# Patient Record
Sex: Female | Born: 1975 | Hispanic: No | Marital: Single | Smoking: Never smoker
Health system: Southern US, Community
[De-identification: ages and names within clinical notes are randomized; demographics above are authoritative.]

## PROBLEM LIST (undated history)

## (undated) ENCOUNTER — Emergency Department (HOSPITAL_COMMUNITY): Discharge status: Absent without leave | Payer: No Typology Code available for payment source

## (undated) DIAGNOSIS — Z5189 Encounter for other specified aftercare: Secondary | ICD-10-CM

## (undated) DIAGNOSIS — I5021 Acute systolic (congestive) heart failure: Secondary | ICD-10-CM

## (undated) DIAGNOSIS — M0820 Juvenile rheumatoid arthritis with systemic onset, unspecified site: Secondary | ICD-10-CM

---

## 2003-03-26 ENCOUNTER — Inpatient Hospital Stay (HOSPITAL_COMMUNITY): Admission: RE | Admit: 2003-03-26 | Discharge: 2003-03-28 | Payer: Self-pay | Admitting: Family Medicine

## 2003-03-31 ENCOUNTER — Encounter: Admission: RE | Admit: 2003-03-31 | Discharge: 2003-03-31 | Payer: Self-pay | Admitting: Family Medicine

## 2004-03-09 ENCOUNTER — Inpatient Hospital Stay (HOSPITAL_COMMUNITY): Admission: AD | Admit: 2004-03-09 | Discharge: 2004-03-10 | Payer: Self-pay | Admitting: Obstetrics and Gynecology

## 2004-04-16 ENCOUNTER — Inpatient Hospital Stay (HOSPITAL_COMMUNITY): Admission: AD | Admit: 2004-04-16 | Discharge: 2004-04-16 | Payer: Self-pay | Admitting: *Deleted

## 2004-09-10 ENCOUNTER — Inpatient Hospital Stay (HOSPITAL_COMMUNITY): Admission: AD | Admit: 2004-09-10 | Discharge: 2004-09-10 | Payer: Self-pay | Admitting: Obstetrics & Gynecology

## 2004-09-10 ENCOUNTER — Ambulatory Visit: Payer: Self-pay | Admitting: Obstetrics and Gynecology

## 2004-10-17 ENCOUNTER — Inpatient Hospital Stay (HOSPITAL_COMMUNITY): Admission: AD | Admit: 2004-10-17 | Discharge: 2004-10-19 | Payer: Self-pay | Admitting: Obstetrics and Gynecology

## 2004-10-17 ENCOUNTER — Ambulatory Visit: Payer: Self-pay | Admitting: Certified Nurse Midwife

## 2006-05-21 ENCOUNTER — Emergency Department (HOSPITAL_COMMUNITY): Admission: EM | Admit: 2006-05-21 | Discharge: 2006-05-22 | Payer: Self-pay | Admitting: Emergency Medicine

## 2006-08-12 ENCOUNTER — Emergency Department (HOSPITAL_COMMUNITY): Admission: EM | Admit: 2006-08-12 | Discharge: 2006-08-12 | Payer: Self-pay | Admitting: Emergency Medicine

## 2007-01-04 ENCOUNTER — Emergency Department (HOSPITAL_COMMUNITY): Admission: EM | Admit: 2007-01-04 | Discharge: 2007-01-04 | Payer: Self-pay | Admitting: Emergency Medicine

## 2008-09-08 ENCOUNTER — Inpatient Hospital Stay (HOSPITAL_COMMUNITY): Admission: EM | Admit: 2008-09-08 | Discharge: 2008-09-15 | Payer: Self-pay | Admitting: Emergency Medicine

## 2008-09-10 ENCOUNTER — Encounter (INDEPENDENT_AMBULATORY_CARE_PROVIDER_SITE_OTHER): Payer: Self-pay | Admitting: *Deleted

## 2008-09-10 ENCOUNTER — Ambulatory Visit: Payer: Self-pay | Admitting: Vascular Surgery

## 2010-07-11 LAB — CBC
HCT: 33.3 % — ABNORMAL LOW (ref 36.0–46.0)
HCT: 34 % — ABNORMAL LOW (ref 36.0–46.0)
HCT: 34 % — ABNORMAL LOW (ref 36.0–46.0)
HCT: 35.3 % — ABNORMAL LOW (ref 36.0–46.0)
HCT: 35.5 % — ABNORMAL LOW (ref 36.0–46.0)
HCT: 35.9 % — ABNORMAL LOW (ref 36.0–46.0)
HCT: 38.8 % (ref 36.0–46.0)
HCT: 39.9 % (ref 36.0–46.0)
Hemoglobin: 11.6 g/dL — ABNORMAL LOW (ref 12.0–15.0)
Hemoglobin: 11.9 g/dL — ABNORMAL LOW (ref 12.0–15.0)
Hemoglobin: 11.9 g/dL — ABNORMAL LOW (ref 12.0–15.0)
Hemoglobin: 12.3 g/dL (ref 12.0–15.0)
Hemoglobin: 12.3 g/dL (ref 12.0–15.0)
Hemoglobin: 12.6 g/dL (ref 12.0–15.0)
Hemoglobin: 13.6 g/dL (ref 12.0–15.0)
Hemoglobin: 14.2 g/dL (ref 12.0–15.0)
MCHC: 34.2 g/dL (ref 30.0–36.0)
MCHC: 34.7 g/dL (ref 30.0–36.0)
MCHC: 34.7 g/dL (ref 30.0–36.0)
MCHC: 34.9 g/dL (ref 30.0–36.0)
MCHC: 35 g/dL (ref 30.0–36.0)
MCHC: 35 g/dL (ref 30.0–36.0)
MCHC: 35.6 g/dL (ref 30.0–36.0)
MCHC: 35.7 g/dL (ref 30.0–36.0)
MCV: 91.8 fL (ref 78.0–100.0)
MCV: 91.9 fL (ref 78.0–100.0)
MCV: 92 fL (ref 78.0–100.0)
MCV: 92.1 fL (ref 78.0–100.0)
MCV: 92.4 fL (ref 78.0–100.0)
MCV: 92.5 fL (ref 78.0–100.0)
MCV: 92.6 fL (ref 78.0–100.0)
MCV: 92.8 fL (ref 78.0–100.0)
Platelets: 188 10*3/uL (ref 150–400)
Platelets: 200 10*3/uL (ref 150–400)
Platelets: 235 10*3/uL (ref 150–400)
Platelets: 235 10*3/uL (ref 150–400)
Platelets: 285 10*3/uL (ref 150–400)
Platelets: 295 10*3/uL (ref 150–400)
Platelets: 307 10*3/uL (ref 150–400)
Platelets: 319 10*3/uL (ref 150–400)
RBC: 3.63 MIL/uL — ABNORMAL LOW (ref 3.87–5.11)
RBC: 3.66 MIL/uL — ABNORMAL LOW (ref 3.87–5.11)
RBC: 3.68 MIL/uL — ABNORMAL LOW (ref 3.87–5.11)
RBC: 3.84 MIL/uL — ABNORMAL LOW (ref 3.87–5.11)
RBC: 3.87 MIL/uL (ref 3.87–5.11)
RBC: 3.89 MIL/uL (ref 3.87–5.11)
RBC: 4.2 MIL/uL (ref 3.87–5.11)
RBC: 4.34 MIL/uL (ref 3.87–5.11)
RDW: 12.4 % (ref 11.5–15.5)
RDW: 12.4 % (ref 11.5–15.5)
RDW: 12.4 % (ref 11.5–15.5)
RDW: 12.5 % (ref 11.5–15.5)
RDW: 12.5 % (ref 11.5–15.5)
RDW: 12.7 % (ref 11.5–15.5)
RDW: 12.8 % (ref 11.5–15.5)
RDW: 12.8 % (ref 11.5–15.5)
WBC: 20.4 10*3/uL — ABNORMAL HIGH (ref 4.0–10.5)
WBC: 21.7 10*3/uL — ABNORMAL HIGH (ref 4.0–10.5)
WBC: 21.7 10*3/uL — ABNORMAL HIGH (ref 4.0–10.5)
WBC: 25.3 10*3/uL — ABNORMAL HIGH (ref 4.0–10.5)
WBC: 26.4 10*3/uL — ABNORMAL HIGH (ref 4.0–10.5)
WBC: 26.9 10*3/uL — ABNORMAL HIGH (ref 4.0–10.5)
WBC: 27.9 10*3/uL — ABNORMAL HIGH (ref 4.0–10.5)
WBC: 29.4 10*3/uL — ABNORMAL HIGH (ref 4.0–10.5)

## 2010-07-11 LAB — COMPREHENSIVE METABOLIC PANEL
ALT: 12 U/L (ref 0–35)
ALT: 18 U/L (ref 0–35)
AST: 29 U/L (ref 0–37)
AST: 36 U/L (ref 0–37)
Albumin: 3.1 g/dL — ABNORMAL LOW (ref 3.5–5.2)
Albumin: 3.9 g/dL (ref 3.5–5.2)
Alkaline Phosphatase: 120 U/L — ABNORMAL HIGH (ref 39–117)
Alkaline Phosphatase: 97 U/L (ref 39–117)
BUN: 4 mg/dL — ABNORMAL LOW (ref 6–23)
BUN: 4 mg/dL — ABNORMAL LOW (ref 6–23)
CO2: 22 mEq/L (ref 19–32)
CO2: 22 mEq/L (ref 19–32)
Calcium: 8 mg/dL — ABNORMAL LOW (ref 8.4–10.5)
Calcium: 8.6 mg/dL (ref 8.4–10.5)
Chloride: 103 mEq/L (ref 96–112)
Chloride: 106 mEq/L (ref 96–112)
Creatinine, Ser: 0.45 mg/dL (ref 0.4–1.2)
Creatinine, Ser: 0.52 mg/dL (ref 0.4–1.2)
GFR calc Af Amer: >60 mL/min (ref >60)
GFR calc Af Amer: >60 mL/min (ref >60)
GFR calc non Af Amer: >60 mL/min (ref >60)
GFR calc non Af Amer: >60 mL/min (ref >60)
Glucose, Bld: 118 mg/dL — ABNORMAL HIGH (ref 70–99)
Glucose, Bld: 67 mg/dL — ABNORMAL LOW (ref 70–99)
Potassium: 2.9 mEq/L — ABNORMAL LOW (ref 3.5–5.1)
Potassium: 3.5 mEq/L (ref 3.5–5.1)
Sodium: 137 mEq/L (ref 135–145)
Sodium: 138 mEq/L (ref 135–145)
Total Bilirubin: 0.7 mg/dL (ref 0.3–1.2)
Total Bilirubin: 0.8 mg/dL (ref 0.3–1.2)
Total Protein: 6.4 g/dL (ref 6.0–8.3)
Total Protein: 7.9 g/dL (ref 6.0–8.3)

## 2010-07-11 LAB — BASIC METABOLIC PANEL
BUN: 4 mg/dL — ABNORMAL LOW (ref 6–23)
BUN: 8 mg/dL (ref 6–23)
CO2: 22 mEq/L (ref 19–32)
CO2: 27 mEq/L (ref 19–32)
Calcium: 8.3 mg/dL — ABNORMAL LOW (ref 8.4–10.5)
Calcium: 8.5 mg/dL (ref 8.4–10.5)
Chloride: 104 mEq/L (ref 96–112)
Chloride: 107 mEq/L (ref 96–112)
Creatinine, Ser: 0.41 mg/dL (ref 0.4–1.2)
Creatinine, Ser: 0.48 mg/dL (ref 0.4–1.2)
GFR calc Af Amer: >60 mL/min (ref >60)
GFR calc Af Amer: >60 mL/min (ref >60)
GFR calc non Af Amer: >60 mL/min (ref >60)
GFR calc non Af Amer: >60 mL/min (ref >60)
Glucose, Bld: 115 mg/dL — ABNORMAL HIGH (ref 70–99)
Glucose, Bld: 59 mg/dL — ABNORMAL LOW (ref 70–99)
Potassium: 3.7 mEq/L (ref 3.5–5.1)
Potassium: 4 mEq/L (ref 3.5–5.1)
Sodium: 137 mEq/L (ref 135–145)
Sodium: 139 mEq/L (ref 135–145)

## 2010-07-11 LAB — DIFFERENTIAL
Band Neutrophils: 0 % (ref 0–10)
Basophils Absolute: 0 10*3/uL (ref 0.0–0.1)
Basophils Absolute: 0 10*3/uL (ref 0.0–0.1)
Basophils Relative: 0 % (ref 0–1)
Basophils Relative: 0 % (ref 0–1)
Blasts: 0 %
Eosinophils Absolute: 0 10*3/uL (ref 0.0–0.7)
Eosinophils Absolute: 0 10*3/uL (ref 0.0–0.7)
Eosinophils Relative: 0 % (ref 0–5)
Eosinophils Relative: 0 % (ref 0–5)
Lymphocytes Relative: 2 % — ABNORMAL LOW (ref 12–46)
Lymphocytes Relative: 8 % — ABNORMAL LOW (ref 12–46)
Lymphs Abs: 0.6 10*3/uL — ABNORMAL LOW (ref 0.7–4.0)
Lymphs Abs: 2.1 10*3/uL (ref 0.7–4.0)
Metamyelocytes Relative: 0 %
Monocytes Absolute: 0 10*3/uL — ABNORMAL LOW (ref 0.1–1.0)
Monocytes Absolute: 0.6 10*3/uL (ref 0.1–1.0)
Monocytes Relative: 0 % — ABNORMAL LOW (ref 3–12)
Monocytes Relative: 2 % — ABNORMAL LOW (ref 3–12)
Myelocytes: 0 %
Neutro Abs: 24.3 10*3/uL — ABNORMAL HIGH (ref 1.7–7.7)
Neutro Abs: 26.7 10*3/uL — ABNORMAL HIGH (ref 1.7–7.7)
Neutrophils Relative %: 92 % — ABNORMAL HIGH (ref 43–77)
Neutrophils Relative %: 96 % — ABNORMAL HIGH (ref 43–77)
Promyelocytes Absolute: 0 %
WBC Morphology: INCREASED
WBC Morphology: INCREASED
nRBC: 0 /100 WBC

## 2010-07-11 LAB — CULTURE, BLOOD (ROUTINE X 2)
Culture: NO GROWTH
Culture: NO GROWTH

## 2010-07-11 LAB — URINALYSIS, ROUTINE W REFLEX MICROSCOPIC
Bilirubin Urine: NEGATIVE
Glucose, UA: NEGATIVE mg/dL
Ketones, ur: NEGATIVE mg/dL
Nitrite: NEGATIVE
Protein, ur: NEGATIVE mg/dL
Specific Gravity, Urine: 1.008 (ref 1.005–1.030)
Urobilinogen, UA: 1 mg/dL (ref 0.0–1.0)
pH: 6.5 (ref 5.0–8.0)

## 2010-07-11 LAB — URINALYSIS, MICROSCOPIC ONLY
Bilirubin Urine: NEGATIVE
Glucose, UA: NEGATIVE mg/dL
Hgb urine dipstick: NEGATIVE
Ketones, ur: NEGATIVE mg/dL
Leukocytes, UA: NEGATIVE
Nitrite: NEGATIVE
Protein, ur: NEGATIVE mg/dL
Specific Gravity, Urine: 1.01 (ref 1.005–1.030)
Urobilinogen, UA: 1 mg/dL (ref 0.0–1.0)
pH: 7 (ref 5.0–8.0)

## 2010-07-11 LAB — POCT I-STAT, CHEM 8
BUN: 5 mg/dL — ABNORMAL LOW (ref 6–23)
Calcium, Ion: 1.01 mmol/L — ABNORMAL LOW (ref 1.12–1.32)
Chloride: 104 mEq/L (ref 96–112)
Creatinine, Ser: 0.6 mg/dL (ref 0.4–1.2)
Glucose, Bld: 97 mg/dL (ref 70–99)
HCT: 43 % (ref 36.0–46.0)
Hemoglobin: 14.6 g/dL (ref 12.0–15.0)
Potassium: 3.5 mEq/L (ref 3.5–5.1)
Sodium: 136 mEq/L (ref 135–145)
TCO2: 23 mmol/L (ref 0–100)

## 2010-07-11 LAB — CYCLIC CITRUL PEPTIDE ANTIBODY, IGG: Cyclic Citrullin Peptide Ab: 1 U/mL (ref <7)

## 2010-07-11 LAB — CK TOTAL AND CKMB (NOT AT ARMC)
CK, MB: 0.6 ng/mL (ref 0.3–4.0)
CK, MB: 1.5 ng/mL (ref 0.3–4.0)
CK, MB: 1.8 ng/mL (ref 0.3–4.0)
Relative Index: INVALID (ref 0.0–2.5)
Relative Index: INVALID (ref 0.0–2.5)
Relative Index: INVALID (ref 0.0–2.5)
Total CK: 53 U/L (ref 7–177)
Total CK: 64 U/L (ref 7–177)
Total CK: 69 U/L (ref 7–177)

## 2010-07-11 LAB — URINE MICROSCOPIC-ADD ON

## 2010-07-11 LAB — PROTIME-INR
INR: 1.2 (ref 0.00–1.49)
Prothrombin Time: 15.4 seconds — ABNORMAL HIGH (ref 11.6–15.2)

## 2010-07-11 LAB — ANTI-DNA ANTIBODY, DOUBLE-STRANDED: ds DNA Ab: 2 IU/mL (ref <5)

## 2010-07-11 LAB — POCT PREGNANCY, URINE: Preg Test, Ur: NEGATIVE

## 2010-07-11 LAB — TECHNOLOGIST SMEAR REVIEW

## 2010-07-11 LAB — URINE CULTURE
Colony Count: 70000
Colony Count: NO GROWTH
Culture: NO GROWTH

## 2010-07-11 LAB — HEPATITIS PANEL, ACUTE
HCV Ab: NEGATIVE
Hep A IgM: NEGATIVE
Hep B C IgM: NEGATIVE
Hepatitis B Surface Ag: NEGATIVE

## 2010-07-11 LAB — RHEUMATOID FACTOR: Rhuematoid fact SerPl-aCnc: <20 IU/mL (ref 0–20)

## 2010-07-11 LAB — GC/CHLAMYDIA PROBE AMP, URINE
Chlamydia, Swab/Urine, PCR: NEGATIVE
GC Probe Amp, Urine: NEGATIVE

## 2010-07-11 LAB — ANTI-SMOOTH MUSCLE ANTIBODY, IGG: F-Actin IgG: <20 U (ref <20)

## 2010-07-11 LAB — SJOGRENS SYNDROME-A EXTRACTABLE NUCLEAR ANTIBODY: SSA (Ro) (ENA) Antibody, IgG: <0.2 AI (ref <1.0)

## 2010-07-11 LAB — MAGNESIUM: Magnesium: 1.7 mg/dL (ref 1.5–2.5)

## 2010-07-11 LAB — LIPID PANEL
Cholesterol: 136 mg/dL (ref 0–200)
HDL: 32 mg/dL — ABNORMAL LOW (ref >39)
LDL Cholesterol: 83 mg/dL (ref 0–99)
Total CHOL/HDL Ratio: 4.3 RATIO
Triglycerides: 107 mg/dL (ref <150)
VLDL: 21 mg/dL (ref 0–40)

## 2010-07-11 LAB — HIV ANTIBODY (ROUTINE TESTING W REFLEX): HIV: NONREACTIVE

## 2010-07-11 LAB — FERRITIN: Ferritin: 2684 ng/mL — ABNORMAL HIGH (ref 10–291)

## 2010-07-11 LAB — SEDIMENTATION RATE: Sed Rate: 16 mm/hr (ref 0–22)

## 2010-07-11 LAB — C-REACTIVE PROTEIN: CRP: 14 mg/dL — ABNORMAL HIGH (ref <0.6)

## 2010-07-11 LAB — APTT: aPTT: 37 seconds (ref 24–37)

## 2010-07-11 LAB — ANTISTREPTOLYSIN O TITER: ASO: <25 IU/mL (ref 0–116)

## 2010-07-11 LAB — SJOGRENS SYNDROME-B EXTRACTABLE NUCLEAR ANTIBODY: SSB (La) (ENA) Antibody, IgG: <0.2 AI (ref <1.0)

## 2010-07-11 LAB — TSH: TSH: 1.25 u[IU]/mL (ref 0.350–4.500)

## 2010-07-11 LAB — ANTI-RIBONUCLEIC ACID ANTIBODY: Ribonucleic Protein(ENA) Antibody, IgG: <0.2 AI (ref <1.0)

## 2010-07-11 LAB — PHOSPHORUS: Phosphorus: 2.3 mg/dL (ref 2.3–4.6)

## 2010-07-11 LAB — ANA: Anti Nuclear Antibody(ANA): NEGATIVE

## 2010-08-16 NOTE — Consult Note (Signed)
NAME:  Kathy Rosales, Kathy Rosales        ACCOUNT NO.:  [id number]   MEDICAL RECORD NO.:  [id number]          PATIENT TYPE:  INP   LOCATION:  5128                         FACILITY:  [facility]   PHYSICIAN:  Acey Lav, MD  DATE OF BIRTH:  March 25, 1976   DATE OF CONSULTATION:  09/11/2008  DATE OF DISCHARGE:                                 CONSULTATION   REASON FOR INFECTIOUS DISEASE CONSULTATION:  A patient with fever, rash,  arthralgias and myalgias.   HISTORY OF PRESENT ILLNESS:  [name] is a 35 year old [place]  lady who has been previously healthy, who presents acutely with onset of  swelling in her wrists that developed last Friday.  She then developed a  fever up to 103 degrees, swelling and pain which progressed into both of  her wrists, elbows, shoulders, hips and knees.  The knee pain was so  severe that she was having difficulty ambulating.  She came to the  emergency department, where she was found  to be febrile at 101.7  degrees.  Her white blood cell count in the ER was 26,000, and  reportedly her smear showed 20% bands.  Her chest x-ray had shown a  nodular density in the right lower lobe.  She was admitted to the  hospitalists' service with concerns of a rheumatological disease, but  also likely due to the bandemia and leukocytosis concern of an  infectious disease process.  She was started on prednisone 40 mg daily,  along with Rocephin and azithromycin.  Blood cultures and urine cultures  were obtained on admission and these fail to grow any pathogenic  organisms.  She has had imaging done, which has included films of her  hands which are negative.  Wrist films that show joint effusions  bilaterally.  She had a CT abdomen and pelvis which shows bilateral  pleural effusions with atelectasis and left external iliac adenopathy  and a cystic left adnexal process.  The patient initially also had a  pruritic and evanescent rash on her arms and legs, which was  blanching.  While on the steroids the rash disappeared and her join pain improved.  She then had steroids tapered.  Her rash returned and her joint pain  intensified, and she now today, when I examined her has developed a  malar rash.  In the interim, her white count also  has increased.  We  are consulted to work her up for am infectious disease cause of her  rash, arthralgias, fevers and leukocytosis.   PAST MEDICAL HISTORY:  No past medical history.   FAMILY HISTORY:  No family members with any significant medical disease  and no rheumatological disease.   SOCIAL HISTORY:  Smokes five cigarettes a day, occasionally alcohol.  No  illicit drugs. Lives with her husband.  She moved to the [place]  from [place] 30 years ago, and came [place] the border in [place].  She  cannot recall having any history of desert rheumatism at the time.   REVIEW OF SYSTEMS:  As described in the history of present illness,  otherwise a 10-point review of systems is negative.   PHYSICAL EXAMINATION:  VITAL SIGNS:  Temperature maximum since the last  24 hours is 100.3 degrees, current temperature 98.2, blood pressure  143/84, pulse 78, respirations 16, pulse ox 98% on room air.  GENERAL:  A quite pleasant lady, in no acute distress.  HEENT:  Normocephalic, atraumatic.  Pupils equal and reactive slightly  to light.  Sclerae anicteric.  She has a distinct malar rash present.  Oropharynx clear without exudate or lesions.  NECK:  Supple.  CARDIOVASCULAR:  Regular rate and rhythm.  No murmurs, gallops or rubs.  LUNGS:  Clear to auscultation bilaterally without wheeze or rales.  ABDOMEN:  Soft, nontender.  NEUROLOGIC:  Nonfocal.  MUSCULOSKELETAL:  The patient has pain on palpation of her shoulders  bilaterally and elbows.  She has pain on palpation of her wrists, but  not of any of her joints in her fingers, and no evidence synovitis in  these sites.  She does not have tenderness with external or  internal  rotation of her hip joints.  She has tenderness about her knees with  palpation, with an effusion bilaterally.  She has tenderness with varus  and valgus stress.  She also has tenderness in her ankles with flexion  and dorsiflexion and palpation of the ankle joint.   LABORATORY DATA:  CBC differential today with a white blood cell count  of 21.7, hemoglobin 11.9, platelets are 235. Hepatitis panel negative  for hepatitis-B surface antigen, core antibody ANC.  Metabolic panel:  Sodium of 137, potassium 3.7, chloride 107, bicarb 22, BUN 4, creatinine  0.41, glucose 114, calcium 8.3.  Rheumatoid factor negative.  ANA negative.  CK 53, MB 1.5.  TSH 1.2.  HIV negative.  Sed rate 18.  C-reactive protein 14.  Liver functions at  admission normal.  Bilirubin normal, normal  transaminases.   Urinalysis on admission with 3 to 6 white cells on microscopic, also  trace blood, moderate leukocyte esterase.   MICROBIOLOGICAL DATA:  Urine cultures on the 06/10th negative.  On  06/08th had 70,000 units of inconsequential growth.  The blood cultures  from 06/07th showed no growth.   RADIOGRAPHY:  CT of the pelvis showing tiny bilateral pleural effusions.  No acute process in the abdomen, but left external iliac adenopathy, a  33.1 cm left adnexal process, likely to be physiologic ovarian cyst.  The wrist views as mentioned, showing joint effusion bilaterally.  The  hand views showed no acute process.   IMPRESSION/RECOMMENDATIONS:  A 35 year old [place] lady with acute onset  of joint pain, fevers, pruritic blanching erythematous rash on her arms  and legs, and also with a malar rash with joint effusions in her wrists,  knees, elbows and multiple joints involved, with multiple joint pain,  who improved on steroids and now has gotten worse with withdrawal of the  steroids, who has leukocytosis and reported bandemia on initial complete  blood count review.  1. Fevers, joint pains, rash and  leukocytosis:  This presentation is      highly consistent with a rheumatological process.  Her malar rash      is classic for systemic lupus erythematosus.  Her antinuclear      antibody is negative, but certainly there are forms of lupus that      are antinuclear antibody-negative, and she is having a      rheumatological workup, which is under way.  I do not see any      evidence of an infectious disease process here, based on her  presentation.  One could get a gonorrhea and Chlamydia, but more      for thoroughness for health care screening.  I have never heard or      read of gonorrhea presenting in this kind of a manner.  She has      been screened for human immunodeficiency virus, which is negative.      She has been screened for hepatitis viruses.  There are a host of      other viruses that could be screened, but none of them would      present in this manner, with a classic malar rash, multiple joint      involvement and effusions, all highly suggestive of rheumatological      process.  Finally, she has been steroid-responsive.  I would      recommend taking her off of her Rocephin and azithromycin.  I would      increase her prednisone, to see if this helps her symptoms.  I      would do the rheumatological workup that you are currently      undertaking, and I would certainly consult rheumatology.  2. Fever, likely part of the rheumatological process as well:  I do      not think she has any evidence of any bacterial process causing      this fever at all.  3. Bandemia:  I suspect this was a lab error.  We have certainly      experienced this before, where a peripheral smear has been misread.      Want to repeat the peripheral smear, but I would not react to this      by giving broad-spectrum antibiotics, given her clinical picture,      which is not consistent with a severe infection at this point in      time.  4. Ovarian cyst:  One could get a trans-vaginal ultrasound.   I will      defer to the primary team regarding this.  I would observe this      patient off of antibiotics over the weekend and get a rheumatology      consultation.  I do not      think there is an infectious process going on.  My colleague, Dr.      Cliffton Asters, be available with questions over the weekend.      Please page him over the weekend if anything comes up to suggest      that the patient have an infectious disease process.      Acey Lav, MD  Electronically Signed     CV/MEDQ  D:  09/11/2008  T:  09/11/2008  Job:  [id number]

## 2010-08-16 NOTE — Discharge Summary (Signed)
NAME:  Kathy Rosales, RIGGENBACH        ACCOUNT NO.:  [id number]   MEDICAL RECORD NO.:  [id number]          PATIENT TYPE:  INP   LOCATION:  5128                         FACILITY:  [facility]   PHYSICIAN:  Monte Fantasia, MD  DATE OF BIRTH:  1975-11-08   DATE OF ADMISSION:  09/07/2008  DATE OF DISCHARGE:                               DISCHARGE SUMMARY   PRIMARY CARE PHYSICIAN:  Unassigned.  The patient will need to follow up  with [facility].   ID consult with Dr. Daiva Eves and Rheumatology consult with Dr. Dareen Piano.   DISCHARGE DIAGNOSES:  1. Adult-onset Still disease.  2. Leukocytosis secondary to adult-onset Still disease.   DISCHARGE MEDICATIONS:  1. Prednisone 20 mg p.o. t.i.d.  2. Celebrex 100 mg p.o. b.i.d.  3. Senna 1 tablet p.o. nightly.   COURSE DURING THE HOSPITAL STAY:  [name] is a 35 year old  Hispanic lady patient who was admitted on September 08, 2008, with complaints  of swelling, rash, and high-grade fevers.  On admission, the patient had  a temperature of 101.7 with 26,000 white count, left shift of 92% and  more than 20% band.  The patient had a UA, which was positive with  moderate leukocytes, negative for nitrite, trace blood, and negative  ketones, bili, and glucose.  The patient initially on admission was, in  view of her fevers and leukocytosis, was started on IV antibiotics with  Rocephin, and blood cultures were sent with urine cultures, also  rheumatological process was suspected.  Initial workup for SLE with ANA  and rheumatoid factor was negative.  HIV was nonreactive, and hepatitis  profile was negative.  The patient initially was also started on  prednisone for suspicion for rheumatological disorder; however, through  the stay in the hospital, the blood cultures and in view of sepsis, the  prednisone was tapered initially and however, the blood and urine  cultures were no growth to date.  The patient on tapering of steroids,  her rash and joint  pains intensified, rheumatological evaluation was  called with Dr. Dareen Piano with high levels of ferritin in 1000s and ANA  negative, less likely to be SLE and more suggestive of adult-onset Still  disease.  The patient was started on high-dose steroids of 20 mg p.o.  t.i.d.  We would continue the same for now, and the patient needs to  follow up as an outpatient in view of the same.  We would discharge the  patient in next 24-48 hours.  The patient is recommended to follow up  with Dr. Dareen Piano, with rheumatologist and have a primary care  physician.   RADIOLOGICAL INVESTIGATIONS DONE DURING THE STAY IN THE HOSPITAL:  1. Chest x-ray done on September 07, 2008.  Impression:  Faint 1.5-cm nodular density in the right lung base could  represent nipple shadow or 2 pulmonary nodules.  Recommend nipple marker  views or chest CT for further characterization.  1. Chest x-ray done on September 07, 2008, possible small nodular density in      the right lower chest, does not correlate with the nipple, followup      chest x-ray in 3 months  suggested.  2. Chest x-ray done on September 08, 2008, no active chest disease, no      evidence of right lower zone pulmonary nodule as questioned      previously.  3. Hand views done bilaterally on September 08, 2008.  Impression was negative.  Joint effusions bilaterally and normal  mineralization.  1. CT abdomen with contrast.  Impression:  Tiny bilateral pleural effusion.  No acute abdominal  process.  1. CT pelvis.  Impression:  Left external iliac adenopathy, nonspecific, with 3.1-cm  cystic left adnexal mass, most likely physiological ovarian cyst.   LABORATORY DATA DONE DURING THE STAY IN THE HOSPITAL:  Total WBC 20.4,  hemoglobin 11.6, hematocrit 33.3, platelets of 307.  Sodium 139,  potassium 4.0, chloride 104, bicarb 27, glucose 59, BUN 8, creatinine  0.48, calcium of 8.5.  Ferritin 2684.  Hepatitis studies for B surface  antigen and core antibody negative.  Hepatitis A  antibody negative, and  hepatitis C antibody negative.  ANA is negative.  Antistreptolysin  antibody is less than 25.  GC probe was negative, Chlamydia probe was  negative.  Urine cultures have been no growth to date.   DISPOSITION:  The patient is appeared to be medically stable to be  discharged in next 24 hours at present and would follow up with consult  with Dr. Dareen Piano.  The patient needs to follow up with Dr. Dareen Piano as  an outpatient in next 1 week and also follow up with [facility] for the  same.  The patient needs to have chest x-ray follow up in next 3 months  for a possible pulmonary nodule on the chest x-ray.   TOTAL TIME FOR DICTATION:  35 minutes.      Monte Fantasia, MD  Electronically Signed     MP/MEDQ  D:  09/14/2008  T:  09/14/2008  Job:  [id number]

## 2010-08-16 NOTE — H&P (Signed)
NAME:  PANDORA, MCCRACKIN        ACCOUNT NO.:  [id number]   MEDICAL RECORD NO.:  [id number]          PATIENT TYPE:  EMS   LOCATION:  MAJO                         FACILITY:  [facility]   PHYSICIAN:  Manus Gunning, MD      DATE OF BIRTH:  Jun 27, 1975   DATE OF ADMISSION:  09/07/2008  DATE OF DISCHARGE:                              HISTORY & PHYSICAL   PRIMARY CARE PHYSICIAN:  Unassigned.   HISTORY OF PRESENT ILLNESS:  Ms. Gerome Sam is a 35 year old female  who presents with chief complaint of arthralgias.  She claims that she  has developed swelling and a rash in bilateral upper extremities since  Friday.  The swelling she claims decreased over the weekend and she  subsequently developed bilateral wrist, elbow, shoulder, hip, and knee  pain.  She claims that the pain in her hips and knees is so severe and  she has been she has been unable to ambulate.  And this has prompted her  to present to the emergency department.  At the time of presentation,  she had temperature 101.7.  White blood cell count of 26,400 with a left  shift and 92% leukocytes and greater than 20% bands.  She had a UA which  demonstrated moderate leukocytes, negative nitrite, trace blood,  negative ketones, bili and glucose.  Microanalysis demonstrating  bacteria which were rare wbc's of 3-6, and squamous epithelial cells  with were few.  The patient denies any sick contacts or recent travel  history.  No ingestion of unusual fluids.  No insect bites.  Denies  headaches, denies syncope, presyncope.  No loss of consciousness.  No  odynophagia, no  dysphagia.  No swelling of the tongue.  No swelling of  eyes, no blurring of vision.  No tinnitus.  No syncope or presyncope.  No chest pain, palpitations, PND, orthopnea, or shortness of breath,  cough, expectoration.  No rhinorrhea.  No history of allergies.  Denies  any abdominal pain.  No nausea, vomiting, diarrhea, constipation,  dysuria, polyuria, hematuria.  No  bright red blood per rectum.  No  melenic stools and does complain of multiple arthritic complaints as  described above.  Does complain of generalized body aches as well.  She  claims that this happened to her for approximately 6 months ago.  At  that time she only had a rash and no overt myalgias or arthralgias  accompanied her symptoms.  She claimed that she had a rash off and on.  The rash appears to like whelps on her bilateral forearms and extending  up to her arms.  They are raised, erythematous,  nontender, slightly  warm to touch.  There are no signs of petechiae or bruising.   PAST MEDICAL/SURGICAL HISTORY:  None.   SOCIAL HISTORY:  Smokes five cigarettes a day for 5 years.  Socially  drinks alcohol.  No illicit drugs.  Lives at home with her husband.   FAMILY HISTORY:  Claims that there is no history of rheumatological  disease in her family.  No heart disease.  No diabetes, hypertension.   ALLERGIES:  No known drug allergies.   HOME MEDICATIONS:  None.   REVIEW OF SYSTEMS:  A 14-point review of system is performed.  Pertinent  positives and negatives as described above.   PHYSICAL EXAMINATION:  VITALS:  At time of presentation temperature  101.7,  heart rate 114, respiratory rate 21, blood pressure 135/80.  Oxygen saturation 99% on room air.  GENERAL:  Well-nourished, well-developed Hispanic female lying in bed  comfortably.  No apparent distress.  HEENT: Normocephalic, atraumatic.  Moist oral mucosa.  No thrush, erythema, or post nasal drip.  No buccal  erythema.  No focal skin lesions.  Next eyes anicteric.  Extraocular  muscles are intact.  Pupils are equal, react to light and accommodation.  NECK:  Supple.  Good range of motion.  No thyromegaly.  No carotid  bruits.  Neck veins appear within normal limits.  CARDIOVASCULAR:  S1 and S2 normal.  Tachycardiac.  No rubs or gallops  appreciated.  No murmurs.  CHEST:  Good air entry bilaterally and equal.  No rales,  rhonchi or  wheezes appreciated.  ABDOMEN:  Soft, nontender, nondistended.  Positive bowel sounds.  No  organomegaly.  EXTREMITIES:  No clubbing, cyanosis or edema.  Positive bilateral  dorsalis pedis.  CNS: Alert, oriented x3.  Cranial nerves II-XII grossly intact.  Power,  sensation, reflex bilaterally symmetrical.  SKIN:  No breakdown,  lesions, ulcerations or masses.  HEM/ONC:  No ecchymosis, bruising or petechiae.  Positive swelling as  described above in HPI.   LABORATORY DATA:  Tests are as follows:  Chest x-ray demonstrates  possible small nodular density to the right lower chest.  Followup chest  x-ray in 3 months as suggested.  Sodium 137, potassium 3.5, chloride  103, CO2 22, glucose 67, BUN 4, creatinine 0.52, T bili 0.7, alk phos  120, AST 36, ALT 18, total protein 7.9, albumin 3.9, calcium 8.6.  White  blood cell count 26,400, hemoglobin 14.3, hematocrit 39.9, platelet  count 235, blasts zero with 92% polymorphs and increased bands greater  than 20%.  Urinalysis as described in HPI section.   ASSESSMENT AND PLAN:  1. Arthralgias and myalgias with fever and leukocytosis.  Etiology of      this is uncertain.  Of concern is rheumatological      disease/connective tissue disorder.  Check ESR.  Check C-reactive      protein.  Check hepatitis profile.  Check HIV.  Check ANA profile      and check a rheumatoid factor.  Start Benadryl 12.5 mg IV q.6 h      p.r.n. for pruritus.  Start prednisone 40 mg p.o. daily.  I took an      x-ray of bilateral wrist and fingers since she complaints these to      be the worst joints.  We will assess.  May require rheumatological      consult. At this time, hold off.  2. Urinary tract infection.  At this time I do not believe that this      is her primary cause of presentation.  She has not been on any      medications recently.  No change in her diet.  No recent travel.      Regardless, start Rocephin 1 gram IV q.24 h.  Check blood  cultures      x2, urine culture and sensitivity.  Start fluids, normal saline at      100 mL an hour.  Recheck chest x-ray in the morning for further      evaluation.  3. Deep venous thrombosis/GI prophylaxis.  Protonix 40 mg p.o. daily.      Lovenox 40 mg subcu q 24 hours.  4. Also check a urine drug screen as well as a TSH and a fasting lipid      profile.      Manus Gunning, MD     SP/MEDQ  D:  09/08/2008  T:  09/08/2008  Job:  [id number]

## 2010-08-16 NOTE — Consult Note (Signed)
NAME:  Kathy Rosales, Kathy Rosales        ACCOUNT NO.:  [id number]   MEDICAL RECORD NO.:  [id number]          PATIENT TYPE:  INP   LOCATION:  5128                         FACILITY:  [facility]   PHYSICIAN:  Lurena Nida, MD   DATE OF BIRTH:  19-Jul-1975   DATE OF CONSULTATION:  09/14/2008  DATE OF DISCHARGE:                                 CONSULTATION   She is being seen at the request of Dr. Manus Gunning.   CHIEF COMPLAINT:  Polyarthralgias.   HISTORY OF PRESENT ILLNESS:  Kathy Rosales is a 35 year old  Hispanic female who approximately 5 to 6 days ago developed acute  painful swelling of her wrist.  She says the pain was quite severe.  The  swelling was rather remarkable, was associated with morning stiffness  lasting greater than an hour.  She had no involvement at that time of  the MCPs or PIPs and still does not have involvement there.  She did  have some discomfort of the elbow, shoulder, hip and knee but had no  swelling there.  The pain there could also be quite significant.  She  went to the emergency room, had a temperature of 101.7, white count of  26,000.  She said that for the last 5 months or so that she has been  dealing with some stiffness and discomfort in her back, dealing with  some occasional myalgias.  She has had some mild alopecia but no oral  ulcers.  She has had some redness over her cheek, she has had for 5  months, but only when the wrist problem began did she have this fleeting  rash appearing on her arms and legs which is slightly papular and  erythematous as well as pruritic.  She says the rash tends to be there  in the morning or in the evening mostly but can be present during the  day.  She also reports about a one-week history roughly coinciding with  the onset of the arthritis.  Fever is occurring either in the morning or  in the evening.  The patient was admitted, given fluid, was placed on IV  antibiotics secondary to possible infection.  About 2 or  3 days into the  hospital stay, she began receiving a rheumatologic evaluation and also  had a consultation by Dr. Daiva Eves from Infectious Disease.  She had a  negative rheumatoid factor on June 8 and negative ANA also on June 8.  Based upon the patient's presentation, I was asked to consult on the  patient.   REVIEW OF SYSTEMS:  Positive for fevers.  Negative for weight loss.  Negative for inflammatory eye disease or dry eyes.  Negative for oral  ulcers or dry mouth.  Negative for chest pain or claudication.  Negative  for dyspnea or productive cough.  Negative for abdominal pain or melena.  Negative for dysuria or hematuria.  Musculoskeletal review of systems  are reviewed in the HPI.  SKIN:  Notable for a rash as mentioned in the  HPI.  Negative for any nodules.  Negative for Raynaud's.  NEUROLOGIC:  Negative for seizures or peripheral neuropathy.  PSYCH:  Negative for  any mental status changes or anxiety.   PAST MEDICAL HISTORY:  The patient reports no prior medical problems.   SOCIAL HISTORY:  She does smoke cigarettes.  Socially drinks alcohol.  No illicit drugs.   FAMILY HISTORY:  Negative for rheumatoid arthritis, lupus, or childhood  arthritis.   ALLERGIES:  She has no known drug allergies.   PHYSICAL EXAMINATION:  VITAL SIGNS:  Currently, her temperature is 97.9,  pulse is 70, pressures 126/78.  There was a fever recorded yesterday  morning at 6 o'clock of 100.3.  She is sating 99% on room air.  GENERAL:  She is a well-appearing, stable, Hispanic female in no acute  distress.  HEENT:  Eyes are clear.  No periorbital edema.  Head and neck,  oropharynx is moist.  There is no oral ulcers.  There is no typical  lupus or alopecia.  CARDIOVASCULAR:  She has good distal perfusion. Good capillary refill.  A 2+ radial and dorsalis pedis pulses bilaterally.  RESPIRATORY:  She is breathing comfortably.  There is no digital  clubbing.  No cyanosis.  No wheezing.  GI:  No pain  with abdominal palpation.  No rebound.  No obvious  abdominal effusion.  GU:  Deferred.  MUSCULOSKELETAL:  There is no synovitis at the elbow, wrist, MCP, or  PIPs.  There is no dactylitis in the toes.  There is no effusions in the  knees.  No crepitus.  Ankles and toes are unremarkable.  SKIN:  She has slightly raised lesions ranging from a dime smaller with  some evidence of excoriation but no scale.  There is no classic malar  erythema for lupus.  PSYCH:  Alert and oriented x4.  Good insight, judgment, and memory.  NEUROLOGIC:  She has good distal sensation.  Cranial nerves appear to be  intact.   OBJECTIVE:  There is a negative ANA on June 8.  Negative rheumatoid  factor on June 8.  ASO is undetectable on June 11.  CBC on admission  showed a white count of 26.4, hemoglobin of 14.2, platelets of 235.  CMP  on admission showed a normal serum creatinine.  Slightly elevated  alkaline phosphatase.  Normal AST and ALT.  CK level of 69.  Sed rate on  June 8 of 16 but an elevated CRP of 14.  HIV nonreactive.  TSH of 1.25.  Blood cultures negative to date.  Peripheral smear demonstrated a mild  left shift with some toxic granulation, ferritin of 2684.  Gonorrhea and  chlamydia probe negative.  I reviewed the results of the wrist x-ray  which was unremarkable.   ASSESSMENT AND PLAN:  This is a 35 year old Hispanic female, presenting  rather acutely with a systemic inflammatory condition, consisting of  fevers, symmetric nonerosive arthritis, and leukocytosis with rash with  a negative infectious workup to date, also with some response to  prednisone.  She has ANA rheumatoid factor negative.  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  She has had fever of at least 1 week.  Arthritis lasting  almost 2 weeks.  A rash which is not quite classic for adult Still  disease but is certainly in the right location.  She also has a PMN  predominant  leukocytosis and a ferritin of almost 3000.  She has had  some response to prednisone.  I have changed the regimen from 60 mg a  day to 20 t.i.d.  The  toxic granulation seen on the peripheral smear is  frequently seen in this condition.  From a rheumatologic point of view,  the patient could probably go home tomorrow after the transition to  t.i.d. dosing of prednisone.  I will bring her some literature in  Spanish on this condition tomorrow.  I will arrange outpatient followup.   Thank you for allowing me to participate in the care of this patient and  performing this consult.  Please feel free to contact me with any  questions.      Lurena Nida, MD  Electronically Signed     AA/MEDQ  D:  09/14/2008  T:  09/14/2008  Job:  [id number]

## 2010-08-19 NOTE — Discharge Summary (Signed)
NAME:  Kathy Rosales, Kathy Rosales               ACCOUNT NO.:  [id number]   MEDICAL RECORD NO.:  [id number]          PATIENT TYPE:  EMS   LOCATION:  MAJO                         FACILITY:  [facility]   PHYSICIAN:  Lesly Dukes, M.D. DATE OF BIRTH:  04-07-1975   DATE OF ADMISSION:  09/10/2004  DATE OF DISCHARGE:  09/10/2004                                 DISCHARGE SUMMARY   ADMISSION DIAGNOSES:  71.  A 35 year old G2, P0, 1, 0, at 32 weeks and 6 days.  2.  Status post fall.  3.  Vaginal bleeding.   DISCHARGE DIAGNOSES:  31.  A 35 year old gravida 2, para 0, 0, 1, 0 at 33 weeks.  2.  Vaginal bleeding, resolved.   DISCHARGE MEDICATIONS:  Prenatal vitamins 1 p.o. daily.   ADMISSION HISTORY:  [name] is a 35 year old G2, P0, 0, 0, 1, 0 who was  transferred from [facility] after a fall. The patient states she  tripped when walking outside, falling backwards landing on her back and  buttocks. The patient stated that she began having abdominal pain and  vaginal bleeding similar to her periods. According to [facility] emergency  department evaluation no blood was found on speculum exam. At admission  patient complained of suprapubic pain, denied rupture of membranes, and  reported good fetal activity. The patient does have a history of  spontaneously abortion at 4 weeks in 1999.   ADMISSION LABS:  Blood type Rh, O positive. Antibody screen negative.  Hemoglobin 12.3, platelets 269,000.   PHYSICAL EXAMINATION:  VITAL SIGNS: On admission revealed temperature 99,  pulse 87, blood pressure 111/64.  SPECULUM EXAM: Done at [facility] emergency department negative for  blood. Digital cervical exam: Cervix posterior, soft, closed, long, no blood  appreciated on globe. Fetal heart tones 125. Good reactivity. Moderate  variability. No decelerations. Contractions every 15 to 25 minutes of 60  second duration with mild intensity.   HOSPITAL COURSE:  The patient was admitted to labor and  delivery for further  observation and continued monitoring. Hospital course without any  complications. Continuous EFN revealed good fetal heart tones that ranged  between 130 and 150. Fetus was reactive with good accelerations, no  decelerations per tachometry, and no uterine contractions, 5 hours previous  to discharge date. No reported vaginal bleeding, leakage of fluid or  subjective contractions reported by patient. Clinical social work was  consulted secondary to aggressive father of the baby answering most of the  questions on behalf of patient, in addition to complaints that patient  questions were not directed to him so that he may answer for her. The  patient denied history of abuse to the Child psychotherapist. She was referred to  [facility] but did not make any safety plans just in case. No bruising or  abrasions appreciated by Child psychotherapist. The patient was given the social  worker's telephone number at [facility] at time of discharge. The  patient is stable, and fetus is stable at discharge.   FOLLOW UP:  The patient will [facility] next  regularly  scheduled appointment. The patient was given preterm labor  instructions at discharge.       VRE/MEDQ  D:  09/10/2004  T:  09/10/2004  Job:  [id number]

## 2010-08-19 NOTE — Discharge Summary (Signed)
NAME:  Kathy Rosales, Kathy Rosales                    ACCOUNT NO.:  [id number]   MEDICAL RECORD NO.:  [id number]                   PATIENT TYPE:  INP   LOCATION:  5033                                 FACILITY:  [facility]   PHYSICIAN:  Santiago Bumpers. Hensel, M.D.             DATE OF BIRTH:  03/13/1974   DATE OF ADMISSION:  03/26/2003  DATE OF DISCHARGE:  03/28/2003                                 DISCHARGE SUMMARY   PRIMARY CARE PHYSICIAN:  [facility] [address].   ADMITTING PHYSICIAN:  Santiago Bumpers. Hensel, M.D.   CONSULTS:  None.   PROCEDURES:  None.   DISCHARGE DIAGNOSES:  1. Acute pyelonephritis.  2. Viral upper respiratory infection.  3. Candidal vaginitis.  4. Hypokalemia.   DISCHARGE MEDICATIONS:  1. Levaquin 500 mg one p.o. daily for the next 8 days.  2. Tylenol 325 mg two tablets q.4h. p.r.n. fever or pain.  3. Phenergan 12.5 mg one p.o. q.6h. p.r.n. nausea.  4. Metronidazole 500 mg one tablet p.o. b.i.d. for the next six days.   FOLLOW UP:  The patient is recommended to follow up with [facility]  [place] on Monday, March 30, 2003.   HOSPITAL COURSE:  [name] is a 35 year old Hispanic female who  presented with a three-day history of fever, back pain, nausea, vomiting,  and dysuria, who had recently been diagnosed with pyelonephritis one day  prior to presentation.  The patient was initially evaluated by [facility] and administered 1 gm of Rocephin, and was initiated on Levaquin  therapy.  The patient failed outpatient management of the pyelonephritis due  to persistent nausea and vomiting and intolerance of antibiotic therapy by  mouth.  The patient was admitted and placed on Cipro therapy twice daily.  The patient continued to be febrile; however, the pain decreased  significantly, and vomiting decreased significantly.  On hospital day #3,  the patient requested discharge due to the Christmas holiday.  The patient  does have a 53-year-old child at  home, and felt that she could tolerate her  p.o. antibiotics without difficulty.  During hospitalization, the patient  was also treated for Candidal vaginitis with Monistat therapy.  The patient  also received metronidazole therapy for presumed bacterial vaginosis.  The  patient improved clinically over hospitalization, and was discharged in  stable condition.   DISCHARGE INSTRUCTIONS:  1. Pain management with Tylenol 325 mg two tablets p.o. q.4h. p.r.n. fever     or pain.  2. Activity - no restrictions; however, we do recommend increased rest until     the illness has improved.  3. Diet - no restrictions; however, recommend increased fluids.   SPECIAL INSTRUCTIONS:  The patient was recommended to present to the  emergency department for recurrent vomiting or intolerance of antibiotic  therapy.  Increased fluids were also emphasized to patient to help prevent  recurrent dehydration.  The patient expressed understanding of the above.      Kristi  Katrinka Blazing, M.D.                        William A. Leveda Anna, M.D.    KS/MEDQ  D:  03/28/2003  T:  03/29/2003  Job:  [id number]   cc:   [facility], [place]

## 2011-01-12 LAB — URINALYSIS, ROUTINE W REFLEX MICROSCOPIC
Bilirubin Urine: NEGATIVE
Glucose, UA: NEGATIVE
Ketones, ur: NEGATIVE
Leukocytes, UA: NEGATIVE
Nitrite: NEGATIVE
Protein, ur: NEGATIVE
Specific Gravity, Urine: 1.027
Urobilinogen, UA: 1
pH: 6

## 2011-01-12 LAB — URINE MICROSCOPIC-ADD ON

## 2011-01-12 LAB — CBC
HCT: 37.5
Hemoglobin: 13.3
MCHC: 35.3
MCV: 87.5
Platelets: 276
RBC: 4.29
RDW: 12.4
WBC: 6.2

## 2011-01-12 LAB — POCT PREGNANCY, URINE
Operator id: 264421
Preg Test, Ur: NEGATIVE

## 2014-05-11 LAB — CBC AND DIFFERENTIAL
HCT: 34 % — AB (ref 36–46)
Hemoglobin: 10.1 g/dL — AB (ref 12.0–16.0)
WBC: 9.1 10^3/mL

## 2014-05-11 LAB — HEPATIC FUNCTION PANEL
ALT: 29 U/L (ref 7–35)
AST: 13 U/L (ref 13–35)
Bilirubin, Total: 0.3 mg/dL

## 2014-05-11 LAB — BASIC METABOLIC PANEL
BUN: 17 mg/dL (ref 4–21)
Creatinine: 0.6 mg/dL (ref 0.5–1.1)
Glucose: 86 mg/dL
Potassium: 3.9 mmol/L (ref 3.4–5.3)
Sodium: 136 mmol/L — AB (ref 137–147)

## 2014-08-27 ENCOUNTER — Encounter (HOSPITAL_COMMUNITY): Payer: Self-pay | Admitting: Emergency Medicine

## 2014-08-27 ENCOUNTER — Inpatient Hospital Stay (HOSPITAL_COMMUNITY)
Admission: EM | Admit: 2014-08-27 | Discharge: 2014-08-28 | DRG: 812 | Disposition: A | Discharge status: Home / Self Care | Payer: Self-pay | Attending: Internal Medicine | Admitting: Internal Medicine

## 2014-08-27 DIAGNOSIS — D649 Anemia, unspecified: Principal | ICD-10-CM | POA: Diagnosis present

## 2014-08-27 DIAGNOSIS — R519 Headache, unspecified: Secondary | ICD-10-CM | POA: Diagnosis present

## 2014-08-27 DIAGNOSIS — R51 Headache: Secondary | ICD-10-CM

## 2014-08-27 DIAGNOSIS — R079 Chest pain, unspecified: Secondary | ICD-10-CM

## 2014-08-27 DIAGNOSIS — F17210 Nicotine dependence, cigarettes, uncomplicated: Secondary | ICD-10-CM | POA: Diagnosis present

## 2014-08-27 DIAGNOSIS — M0820 Juvenile rheumatoid arthritis with systemic onset, unspecified site: Secondary | ICD-10-CM | POA: Diagnosis present

## 2014-08-27 HISTORY — DX: Juvenile rheumatoid arthritis with systemic onset, unspecified site: M08.20

## 2014-08-27 NOTE — ED Notes (Signed)
Patient c/o headache, epigastric pain burning, as well as mid sternal chest pain. Patient is non english speaking.

## 2014-08-28 ENCOUNTER — Encounter (HOSPITAL_COMMUNITY): Payer: Self-pay

## 2014-08-28 ENCOUNTER — Emergency Department (HOSPITAL_COMMUNITY): Payer: Self-pay

## 2014-08-28 DIAGNOSIS — R51 Headache: Secondary | ICD-10-CM

## 2014-08-28 DIAGNOSIS — D649 Anemia, unspecified: Secondary | ICD-10-CM | POA: Diagnosis present

## 2014-08-28 DIAGNOSIS — R519 Headache, unspecified: Secondary | ICD-10-CM | POA: Diagnosis present

## 2014-08-28 DIAGNOSIS — M0820 Juvenile rheumatoid arthritis with systemic onset, unspecified site: Secondary | ICD-10-CM | POA: Diagnosis present

## 2014-08-28 DIAGNOSIS — R079 Chest pain, unspecified: Secondary | ICD-10-CM

## 2014-08-28 LAB — PREPARE RBC (CROSSMATCH)

## 2014-08-28 LAB — COMPREHENSIVE METABOLIC PANEL
ALT: 17 U/L (ref 14–54)
AST: 14 U/L — ABNORMAL LOW (ref 15–41)
Albumin: 3.7 g/dL (ref 3.5–5.0)
Alkaline Phosphatase: 40 U/L (ref 38–126)
Anion gap: 6 (ref 5–15)
BUN: 14 mg/dL (ref 6–20)
CO2: 24 mmol/L (ref 22–32)
Calcium: 8 mg/dL — ABNORMAL LOW (ref 8.9–10.3)
Chloride: 104 mmol/L (ref 101–111)
Creatinine, Ser: 0.38 mg/dL — ABNORMAL LOW (ref 0.44–1.00)
GFR calc Af Amer: >60 mL/min (ref >60)
GFR calc non Af Amer: >60 mL/min (ref >60)
Glucose, Bld: 115 mg/dL — ABNORMAL HIGH (ref 65–99)
Potassium: 3.7 mmol/L (ref 3.5–5.1)
Sodium: 134 mmol/L — ABNORMAL LOW (ref 135–145)
Total Bilirubin: 0.5 mg/dL (ref 0.3–1.2)
Total Protein: 5.7 g/dL — ABNORMAL LOW (ref 6.5–8.1)

## 2014-08-28 LAB — LIPASE, BLOOD: Lipase: 24 U/L (ref 22–51)

## 2014-08-28 LAB — FERRITIN: Ferritin: 2 ng/mL — ABNORMAL LOW (ref 11–307)

## 2014-08-28 LAB — CBC WITH DIFFERENTIAL/PLATELET
Basophils Absolute: 0 10*3/uL (ref 0.0–0.1)
Basophils Relative: 0 % (ref 0–1)
Eosinophils Absolute: 0 10*3/uL (ref 0.0–0.7)
Eosinophils Relative: 1 % (ref 0–5)
HCT: 15.6 % — ABNORMAL LOW (ref 36.0–46.0)
Hemoglobin: 4.2 g/dL — CL (ref 12.0–15.0)
Lymphocytes Relative: 13 % (ref 12–46)
Lymphs Abs: 0.8 10*3/uL (ref 0.7–4.0)
MCH: 20.2 pg — ABNORMAL LOW (ref 26.0–34.0)
MCHC: 26.9 g/dL — ABNORMAL LOW (ref 30.0–36.0)
MCV: 75 fL — ABNORMAL LOW (ref 78.0–100.0)
Monocytes Absolute: 0.4 10*3/uL (ref 0.1–1.0)
Monocytes Relative: 7 % (ref 3–12)
Neutro Abs: 5 10*3/uL (ref 1.7–7.7)
Neutrophils Relative %: 80 % — ABNORMAL HIGH (ref 43–77)
Platelets: 344 10*3/uL (ref 150–400)
RBC: 2.08 MIL/uL — ABNORMAL LOW (ref 3.87–5.11)
RDW: 16.8 % — ABNORMAL HIGH (ref 11.5–15.5)
WBC: 6.3 10*3/uL (ref 4.0–10.5)

## 2014-08-28 LAB — FOLATE: Folate: 13.6 ng/mL (ref >5.9)

## 2014-08-28 LAB — DIRECT ANTIGLOBULIN TEST (NOT AT ARMC)
DAT, IgG: NEGATIVE
DAT, complement: NEGATIVE

## 2014-08-28 LAB — CBC
HCT: 17.1 % — ABNORMAL LOW (ref 36.0–46.0)
Hemoglobin: 4.7 g/dL — CL (ref 12.0–15.0)
MCH: 20.5 pg — ABNORMAL LOW (ref 26.0–34.0)
MCHC: 27.5 g/dL — ABNORMAL LOW (ref 30.0–36.0)
MCV: 74.7 fL — ABNORMAL LOW (ref 78.0–100.0)
Platelets: 406 10*3/uL — ABNORMAL HIGH (ref 150–400)
RBC: 2.29 MIL/uL — ABNORMAL LOW (ref 3.87–5.11)
RDW: 16.8 % — ABNORMAL HIGH (ref 11.5–15.5)
WBC: 7.6 10*3/uL (ref 4.0–10.5)

## 2014-08-28 LAB — IRON AND TIBC
Iron: 5 ug/dL — ABNORMAL LOW (ref 28–170)
Saturation Ratios: 1 % — ABNORMAL LOW (ref 10.4–31.8)
TIBC: 399 ug/dL (ref 250–450)
UIBC: 394 ug/dL

## 2014-08-28 LAB — RETICULOCYTES
RBC.: 2.08 MIL/uL — ABNORMAL LOW (ref 3.87–5.11)
Retic Count, Absolute: 64.5 10*3/uL (ref 19.0–186.0)
Retic Ct Pct: 3.1 % (ref 0.4–3.1)

## 2014-08-28 LAB — PROTIME-INR
INR: 1.03 (ref 0.00–1.49)
Prothrombin Time: 13.7 seconds (ref 11.6–15.2)

## 2014-08-28 LAB — LACTATE DEHYDROGENASE: LDH: 126 U/L (ref 98–192)

## 2014-08-28 LAB — I-STAT TROPONIN, ED: Troponin i, poc: 0.01 ng/mL (ref 0.00–0.08)

## 2014-08-28 LAB — I-STAT BETA HCG BLOOD, ED (MC, WL, AP ONLY): I-stat hCG, quantitative: <5 m[IU]/mL (ref <5)

## 2014-08-28 LAB — VITAMIN B12: Vitamin B-12: 223 pg/mL (ref 180–914)

## 2014-08-28 LAB — ABO/RH: ABO/RH(D): O POS

## 2014-08-28 LAB — POC OCCULT BLOOD, ED: Fecal Occult Bld: NEGATIVE

## 2014-08-28 MED ORDER — SODIUM CHLORIDE 0.9 % IV SOLN
Freq: Once | INTRAVENOUS | Status: AC
  Administered 2014-08-28: 12:00:00 via INTRAVENOUS

## 2014-08-28 MED ORDER — ONDANSETRON HCL 4 MG/2ML IJ SOLN: 4.0000 mg | Freq: Four times a day (QID) | INTRAMUSCULAR | Status: DC | PRN

## 2014-08-28 MED ORDER — PREDNISONE 5 MG PO TABS
5.0000 mg | ORAL_TABLET | Freq: Three times a day (TID) | ORAL | Status: DC
  Administered 2014-08-28 (×2): 5 mg via ORAL
  Filled 2014-08-28 (×5): qty 1

## 2014-08-28 MED ORDER — KETOROLAC TROMETHAMINE 30 MG/ML IJ SOLN
30.0000 mg | Freq: Once | INTRAMUSCULAR | Status: AC
  Administered 2014-08-28: 30 mg via INTRAVENOUS
  Filled 2014-08-28: qty 1

## 2014-08-28 MED ORDER — ACETAMINOPHEN 325 MG PO TABS: 650.0000 mg | ORAL_TABLET | Freq: Four times a day (QID) | ORAL | Status: DC | PRN

## 2014-08-28 MED ORDER — BUTALBITAL-APAP-CAFFEINE 50-325-40 MG PO TABS
1.0000 | ORAL_TABLET | ORAL | Status: DC | PRN
  Administered 2014-08-28: 1 via ORAL
  Filled 2014-08-28: qty 1

## 2014-08-28 MED ORDER — TRAMADOL HCL 50 MG PO TABS: 50.0000 mg | ORAL_TABLET | Freq: Four times a day (QID) | ORAL | Status: DC | PRN

## 2014-08-28 MED ORDER — METOCLOPRAMIDE HCL 5 MG/ML IJ SOLN
10.0000 mg | INTRAMUSCULAR | Status: AC
  Administered 2014-08-28: 10 mg via INTRAVENOUS
  Filled 2014-08-28: qty 2

## 2014-08-28 MED ORDER — SODIUM CHLORIDE 0.9 % IV SOLN: 10.0000 mL/h | Freq: Once | INTRAVENOUS | Status: DC

## 2014-08-28 MED ORDER — PANTOPRAZOLE SODIUM 40 MG IV SOLR
40.0000 mg | Freq: Once | INTRAVENOUS | Status: AC
  Administered 2014-08-28: 40 mg via INTRAVENOUS
  Filled 2014-08-28: qty 40

## 2014-08-28 MED ORDER — ACETAMINOPHEN 650 MG RE SUPP: 650.0000 mg | Freq: Four times a day (QID) | RECTAL | Status: DC | PRN

## 2014-08-28 MED ORDER — PROCHLORPERAZINE EDISYLATE 5 MG/ML IJ SOLN
10.0000 mg | Freq: Once | INTRAMUSCULAR | Status: AC
  Administered 2014-08-28: 10 mg via INTRAVENOUS
  Filled 2014-08-28: qty 2

## 2014-08-28 MED ORDER — KETOROLAC TROMETHAMINE 30 MG/ML IJ SOLN: 30.0000 mg | Freq: Once | INTRAMUSCULAR | Status: DC

## 2014-08-28 MED ORDER — PANTOPRAZOLE SODIUM 40 MG IV SOLR
40.0000 mg | Freq: Two times a day (BID) | INTRAVENOUS | Status: DC
  Administered 2014-08-28: 40 mg via INTRAVENOUS
  Filled 2014-08-28: qty 40

## 2014-08-28 MED ORDER — DIPHENHYDRAMINE HCL 50 MG/ML IJ SOLN
12.5000 mg | Freq: Once | INTRAMUSCULAR | Status: AC
  Administered 2014-08-28: 12.5 mg via INTRAVENOUS
  Filled 2014-08-28: qty 1

## 2014-08-28 MED ORDER — ONDANSETRON HCL 4 MG PO TABS: 4.0000 mg | ORAL_TABLET | Freq: Four times a day (QID) | ORAL | Status: DC | PRN

## 2014-08-28 MED ORDER — OXYCODONE-ACETAMINOPHEN 5-325 MG PO TABS
1.0000 | ORAL_TABLET | Freq: Once | ORAL | Status: AC
  Administered 2014-08-28: 1 via ORAL
  Filled 2014-08-28: qty 1

## 2014-08-28 MED ORDER — SODIUM CHLORIDE 0.9 % IJ SOLN
3.0000 mL | Freq: Two times a day (BID) | INTRAMUSCULAR | Status: DC
  Administered 2014-08-28: 3 mL via INTRAVENOUS

## 2014-08-28 MED ORDER — SODIUM CHLORIDE 0.9 % IV BOLUS (SEPSIS)
1000.0000 mL | Freq: Once | INTRAVENOUS | Status: AC
  Administered 2014-08-28: 1000 mL via INTRAVENOUS

## 2014-08-28 NOTE — H&P (Addendum)
[facility] History and Physical  Patient: Kathy Rosales  MRN: [id number]  DOB: [birth year]  DOS: the patient was seen and examined on 08/28/2014 PCP: No primary care provider on file. Dr. Ouida Sills  Referring physician: Antonietta Breach, PA-C Chief Complaint: Headache  HPI: Kathy Rosales is a 39 y.o. female with Past medical history of still's disease. Patient presents with complaints of headache patient mentions that the headache is located in the frontal region and appears to be constant headache. Has been present for last 4-4 days. She also has some complaints of chest pain which is located centrally and feels like a sharp stabbing pain. She denies any fever or chills. Denies any dizziness or lightheadedness. She complains of some nausea but no vomiting. No abdominal pain no diarrhea no constipation. She mentions her periods are regular and has not had any heavy periods. She denies any diarrhea or black color bowel movement. She denies any blood anywhere. She denies any rash. She denies any change in her medications recently. She denies any recent procedures or surgeries. She denies any family history of blood disease.   The patient is coming from home. And at her baseline independent for most of her ADL.  Review of Systems: as mentioned in the history of present illness.  A comprehensive review of the other systems is negative.  Past Medical History  Diagnosis Date  . Still's disease    History reviewed. No pertinent past surgical history. Social History:  reports that she has been smoking Cigarettes.  She does not have any smokeless tobacco history on file. She reports that she does not drink alcohol or use illicit drugs.  No Known Allergies  History reviewed. No pertinent family history.  Prior to Admission medications   Medication Sig Start Date End Date Taking? Authorizing Provider  aspirin 325 MG tablet Take 325 mg by mouth once.   Yes Historical Provider, MD    predniSONE (DELTASONE) 10 MG tablet Take 10 mg by mouth 3 (three) times daily.    Yes Historical Provider, MD  traMADol (ULTRAM) 50 MG tablet Take 50 mg by mouth every 6 (six) hours as needed for moderate pain.   Yes Historical Provider, MD    Physical Exam: Filed Vitals:   08/27/14 2344 08/28/14 0234 08/28/14 0630  BP: 119/57 114/66 117/70  Pulse: 100 83 94  Temp: 98.5 F (36.9 C) 99 F (37.2 C) 98.1 F (36.7 C)  TempSrc: Oral Oral Oral  Resp: 18 21 16   Height: 5\' 1"  (1.549 m)    Weight: 63.504 kg (140 lb)    SpO2: 98% 99% 100%    General: Alert, Awake and Oriented to Time, Place and Person. Appear in mild distress Eyes: PERRL ENT: Oral Mucosa clear moist Neck: no JVD Cardiovascular: S1 and S2 Present, no Murmur, Peripheral Pulses Present Respiratory: Bilateral Air entry equal and Decreased,  Clear to Auscultation, no Crackles, no wheezes Abdomen: Bowel Sound present, Soft and non tender Skin: no Rash Extremities: Bilateral Pedal edema, no calf tenderness Neurologic: Grossly no focal neuro deficit.  Labs on Admission:  CBC:  Recent Labs Lab 08/28/14 0309 08/28/14 0548  WBC 7.6 6.3  NEUTROABS  --  5.0  HGB 4.7* 4.2*  HCT 17.1* 15.6*  MCV 74.7* 75.0*  PLT 406* 344    CMP     Component Value Date/Time   NA 134* 08/28/2014 0309   K 3.7 08/28/2014 0309   CL 104 08/28/2014 0309   CO2 24 08/28/2014 0309   GLUCOSE  115* 08/28/2014 0309   BUN 14 08/28/2014 0309   CREATININE 0.38* 08/28/2014 0309   CALCIUM 8.0* 08/28/2014 0309   PROT 5.7* 08/28/2014 0309   ALBUMIN 3.7 08/28/2014 0309   AST 14* 08/28/2014 0309   ALT 17 08/28/2014 0309   ALKPHOS 40 08/28/2014 0309   BILITOT 0.5 08/28/2014 0309   GFRNONAA >60 08/28/2014 0309   GFRAA >60 08/28/2014 0309     Recent Labs Lab 08/28/14 0309  LIPASE 24    No results for input(s): CKTOTAL, CKMB, CKMBINDEX, TROPONINI in the last 168 hours. BNP (last 3 results) No results for input(s): BNP in the last 8760  hours.  ProBNP (last 3 results) No results for input(s): PROBNP in the last 8760 hours.   Radiological Exams on Admission: Dg Chest 2 View  08/28/2014   CLINICAL DATA:  Chest pain  EXAM: CHEST  2 VIEW  COMPARISON:  None.  FINDINGS: Normal heart size and mediastinal contours. No acute infiltrate or edema. No effusion or pneumothorax. No acute osseous findings.  IMPRESSION: Negative chest.   Electronically Signed   By: Monte Fantasia M.D.   On: 08/28/2014 03:25   Ct Head Wo Contrast  08/28/2014   CLINICAL DATA:  Headache.  History of Still's disease.  EXAM: CT HEAD WITHOUT CONTRAST  TECHNIQUE: Contiguous axial images were obtained from the base of the skull through the vertex without intravenous contrast.  COMPARISON:  None.  FINDINGS: Skull and Sinuses:Negative for fracture or destructive process. The mastoids, middle ears, and imaged paranasal sinuses are clear.  Orbits: No acute abnormality.  Brain: No evidence of acute infarction, hemorrhage, hydrocephalus, or mass lesion/mass effect. 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  IMPRESSION: Negative head CT.   Electronically Signed   By: Monte Fantasia M.D.   On: 08/28/2014 04:08   Assessment/Plan Principal Problem:   Symptomatic anemia Active Problems:   Headache   Chest pain   1. Symptomatic anemia Patient presents with complaints of headache. Further workup shows that she has significant anemia. CAT scan of the head is negative. Her headache is improving with the treatment. Most likely the patient's headache is secondary to anemia. Patient's chest pain can also be attributed to anemia. With this she will be given 2 units of blood transfusion. I would check iron ferritin and T-62 folic acid levels to identify etiology of the anemia.  2. Stills disease. Continue prednisone.  3. Headache. Symptomatically treatment with when necessary Fioricet.  Advance goals of care discussion: Full code   DVT  Prophylaxis: mechanical compression device  Nutrition: Clear liquid diet  Family Communication: family was present at bedside, opportunity was given to ask question and all questions were answered satisfactorily at the time of interview. Disposition: Admitted asinpatient, telemetry unit.  Author: Berle Mull, MD [facility] Pager: [phone] 08/28/2014  If 7PM-7AM, please contact night-coverage [email or web address] Password [email or web address]

## 2014-08-28 NOTE — Discharge Summary (Addendum)
Physician Discharge Summary  Kathy Rosales QAS:[id number] DOB: 05/17/1975 DOA: 08/27/2014  PCP: Dr. Unice Bailey   Admit date: 08/27/2014 Discharge date: 08/28/2014  Recommendations for Outpatient Follow-up:  1. Patient instructed to follow-up with primary care physician in one to 2 weeks to recheck hemoglobin. She has received 3 units of PRBC transfusion during this hospital stay.  Discharge Diagnoses:  Principal Problem:   Symptomatic anemia Active Problems:   Headache   Chest pain   Still's disease    Discharge Condition: stable; pt insists on going home after transfusion completed   Diet recommendation: as tolerated   History of present illness:  39 year old female with past medical history significant for still's disease. She presented to hospital with reports of ongoing headache in the frontal area, constant, sharp, non radiating, at worst 7 out of 10 in intensity. Patient reported headaches for last couple of days prior to this admission but no associated neck stiffness, no fevers or chills, no lightheadedness or dizziness and no blurry vision or vision changes. Patient reported pain better with analgesia given in ED. Tylenol alone did not help with headache. Headache was not resolved with rest.  On admission, patient was hemodynamically stable. Her hemoglobin was as low as 4.2. CT head did not show acute intracranial findings. Chest x-ray did not show acute cardiopulmonary findings. Patient was given blood transfusion and admitted for further management of symptomatic anemia.   Hospital Course:   Principal Problem:   Symptomatic anemia - Unclear etiology. - Patient is status post 3 units of PRBC blood transfusion since the admission. - Patient did not want to stay after the last completed transfusion to have hemoglobin rechecked. - She is medically stable to be discharged after last transfusion completed. She is aware of the need to follow with primary care physician  to recheck hemoglobin.  Active Problems:   Frontal headache - Possibly migraine headache. Headache got better with Percocet. - No acute intracranial findings identified on CT head.    Still's disease - Management per primary.   SignedLeisa Lenz, MD  [facility] 08/28/2014, 10:24 AM  Pager #: [phone]  Time spent in minutes: less than 30 minutes  Procedures:  3 units PRBC transfusion   Consultations:  None   Discharge Exam: Filed Vitals:   08/28/14 0933  BP: 125/74  Pulse: 76  Temp: 98.8 F (37.1 C)  Resp: 22   Filed Vitals:   08/28/14 0234 08/28/14 0630 08/28/14 0707 08/28/14 0933  BP: 114/66 117/70 113/61 125/74  Pulse: 83 94 83 76  Temp: 99 F (37.2 C) 98.1 F (36.7 C) 98.1 F (36.7 C) 98.8 F (37.1 C)  TempSrc: Oral Oral Oral Oral  Resp: 21 16 18 22   Height:      Weight:      SpO2: 99% 100% 100%     General: Pt is alert, follows commands appropriately, not in acute distress Cardiovascular: Regular rate and rhythm, S1/S2 +, no murmurs Respiratory: Clear to auscultation bilaterally, no wheezing, no crackles, no rhonchi Abdominal: Soft, non tender, non distended, bowel sounds +, no guarding Extremities: no edema, no cyanosis, pulses palpable bilaterally DP and PT Neuro: Grossly nonfocal  Discharge Instructions  Discharge Instructions    Call MD for:  difficulty breathing, headache or visual disturbances    Complete by:  As directed      Call MD for:  persistant nausea and vomiting    Complete by:  As directed      Call MD for:  severe uncontrolled pain    Complete by:  As directed      Diet - low sodium heart healthy    Complete by:  As directed      Increase activity slowly    Complete by:  As directed             Medication List    STOP taking these medications        aspirin 325 MG tablet      TAKE these medications        predniSONE 10 MG tablet  Commonly known as:  DELTASONE  Take 10 mg by mouth 3 (three)  times daily.     traMADol 50 MG tablet  Commonly known as:  ULTRAM  Take 50 mg by mouth every 6 (six) hours as needed for moderate pain.          The results of significant diagnostics from this hospitalization (including imaging, microbiology, ancillary and laboratory) are listed below for reference.    Significant Diagnostic Studies: Dg Chest 2 View  08/28/2014   CLINICAL DATA:  Chest pain  EXAM: CHEST  2 VIEW  COMPARISON:  None.  FINDINGS: Normal heart size and mediastinal contours. No acute infiltrate or edema. No effusion or pneumothorax. No acute osseous findings.  IMPRESSION: Negative chest.   Electronically Signed   By: Monte Fantasia M.D.   On: 08/28/2014 03:25   Ct Head Wo Contrast  08/28/2014   CLINICAL DATA:  Headache.  History of Still's disease.  EXAM: CT HEAD WITHOUT CONTRAST  TECHNIQUE: Contiguous axial images were obtained from the base of the skull through the vertex without intravenous contrast.  COMPARISON:  None.  FINDINGS: Skull and Sinuses:Negative for fracture or destructive process. The mastoids, middle ears, and imaged paranasal sinuses are clear.  Orbits: No acute abnormality.  Brain: No evidence of acute infarction, hemorrhage, hydrocephalus, or mass lesion/mass effect. 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  IMPRESSION: Negative head CT.   Electronically Signed   By: Monte Fantasia M.D.   On: 08/28/2014 04:08    Microbiology: No results found for this or any previous visit (from the past 240 hour(s)).   Labs: Basic Metabolic Panel:  Recent Labs Lab 08/28/14 0309  NA 134*  K 3.7  CL 104  CO2 24  GLUCOSE 115*  BUN 14  CREATININE 0.38*  CALCIUM 8.0*   Liver Function Tests:  Recent Labs Lab 08/28/14 0309  AST 14*  ALT 17  ALKPHOS 40  BILITOT 0.5  PROT 5.7*  ALBUMIN 3.7    Recent Labs Lab 08/28/14 0309  LIPASE 24   No results for input(s): AMMONIA in the last 168 hours. CBC:  Recent Labs Lab  08/28/14 0309 08/28/14 0548  WBC 7.6 6.3  NEUTROABS  --  5.0  HGB 4.7* 4.2*  HCT 17.1* 15.6*  MCV 74.7* 75.0*  PLT 406* 344   Cardiac Enzymes: No results for input(s): CKTOTAL, CKMB, CKMBINDEX, TROPONINI in the last 168 hours. BNP: BNP (last 3 results) No results for input(s): BNP in the last 8760 hours.  ProBNP (last 3 results) No results for input(s): PROBNP in the last 8760 hours.  CBG: No results for input(s): GLUCAP in the last 168 hours.

## 2014-08-28 NOTE — Discharge Instructions (Signed)
Anemia, Nonspecific Anemia is a condition in which the concentration of red blood cells or hemoglobin in the blood is below normal. Hemoglobin is a substance in red blood cells that carries oxygen to the tissues of the body. Anemia results in not enough oxygen reaching these tissues.  CAUSES  Common causes of anemia include:   Excessive bleeding. Bleeding may be internal or external. This includes excessive bleeding from periods (in women) or from the intestine.   Poor nutrition.   Chronic kidney, thyroid, and liver disease.  Bone marrow disorders that decrease red blood cell production.  Cancer and treatments for cancer.  HIV, AIDS, and their treatments.  Spleen problems that increase red blood cell destruction.  Blood disorders.  Excess destruction of red blood cells due to infection, medicines, and autoimmune disorders. SIGNS AND SYMPTOMS   Minor weakness.   Dizziness.   Headache.  Palpitations.   Shortness of breath, especially with exercise.   Paleness.  Cold sensitivity.  Indigestion.  Nausea.  Difficulty sleeping.  Difficulty concentrating. Symptoms may occur suddenly or they may develop slowly.  DIAGNOSIS  Additional blood tests are often needed. These help your health care provider determine the best treatment. Your health care provider will check your stool for blood and look for other causes of blood loss.  TREATMENT  Treatment varies depending on the cause of the anemia. Treatment can include:   Supplements of iron, vitamin B12, or folic acid.   Hormone medicines.   A blood transfusion. This may be needed if blood loss is severe.   Hospitalization. This may be needed if there is significant continual blood loss.   Dietary changes.  Spleen removal. HOME CARE INSTRUCTIONS Keep all follow-up appointments. It often takes many weeks to correct anemia, and having your health care provider check on your condition and your response to  treatment is very important. SEEK IMMEDIATE MEDICAL CARE IF:   You develop extreme weakness, shortness of breath, or chest pain.   You become dizzy or have trouble concentrating.  You develop heavy vaginal bleeding.   You develop a rash.   You have bloody or black, tarry stools.   You faint.   You vomit up blood.   You vomit repeatedly.   You have abdominal pain.  You have a fever or persistent symptoms for more than 2-3 days.   You have a fever and your symptoms suddenly get worse.   You are dehydrated.  MAKE SURE YOU:  Understand these instructions.  Will watch your condition.  Will get help right away if you are not doing well or get worse. Document Released: 04/27/2004 Document Revised: 11/20/2012 Document Reviewed: 09/13/2012 ExitCare Patient Information 2015 [employer]. This information is not intended to replace advice given to you by your health care provider. Make sure you discuss any questions you have with your health care provider.  

## 2014-08-28 NOTE — ED Notes (Addendum)
Critical Hemoglobin 4.7  Called by Amy (lab) Primary Nurse and provider notified

## 2014-08-28 NOTE — ED Provider Notes (Signed)
CSN: [id number]     Arrival date & time 08/27/14  2253 History   First MD Initiated Contact with Patient 08/28/14 0239     Chief Complaint  Patient presents with  . Chest Pain  . Abdominal Pain  . Headache    (Consider location/radiation/quality/duration/timing/severity/associated sxs/prior Treatment) HPI Comments: Patient is a 39 year old female with a history of still's disease who presents to the emergency department for further evaluation of chest and abdominal pain. Symptoms were preceded by a migraine headache which began one week ago. Headache begins in the frontal region and radiates to the occipital scalp. It has been constant since onset. Patient has had associated photophobia. 4 days ago she developed pain in her central chest and upper abdomen. She has tried taking prednisone, tramadol, and aspirin for symptoms without relief. She states that the pain is burning in nature and constant. It is nonradiating and not associated with eating. Patient with associated lightheadedness and a feeling of weakness in her b/l lower extremities. No neck stiffness, syncope, nausea, vomiting, diarrhea, fever, dysuria, hematuria, melanoma, hematochezia, vision loss, tinnitus, or hearing loss associated with any of her symptoms. She denies a history of similar symptoms as well as a history of ACS, DVT, or PE. She has had no leg swelling and denies recent travel. She denies a history of esophageal reflux and abdominal surgeries. Her last bowel movement was yesterday which was normal in color and consistency. Last menstrual period was 08/02/2014. She denies any abnormal bleeding with this menses.  The history is provided by the patient and the spouse. No language interpreter was used.    Past Medical History  Diagnosis Date  . Still's disease    History reviewed. No pertinent past surgical history. History reviewed. No pertinent family history. History  Substance Use Topics  . Smoking status: Current  Some Day Smoker    Types: Cigarettes  . Smokeless tobacco: Not on file  . Alcohol Use: No   OB History    No data available      Review of Systems  Constitutional: Positive for fatigue.  Respiratory: Positive for shortness of breath.   Cardiovascular: Positive for chest pain.  Gastrointestinal: Positive for nausea. Negative for vomiting.  Neurological: Positive for weakness (b/l lower extremities), light-headedness and headaches. Negative for syncope.  All other systems reviewed and are negative.   Allergies  Review of patient's allergies indicates no known allergies.  Home Medications   Prior to Admission medications   Medication Sig Start Date End Date Taking? Authorizing Provider  aspirin 325 MG tablet Take 325 mg by mouth once.   Yes Historical Provider, MD  predniSONE (DELTASONE) 10 MG tablet Take 10 mg by mouth 2 (two) times daily with a meal.   Yes Historical Provider, MD  traMADol (ULTRAM) 50 MG tablet Take 50 mg by mouth every 6 (six) hours as needed for moderate pain.   Yes Historical Provider, MD   BP 114/66 mmHg  Pulse 83  Temp(Src) 99 F (37.2 C) (Oral)  Resp 21  Ht 5\' 1"  (1.549 m)  Wt 140 lb (63.504 kg)  BMI 26.47 kg/m2  SpO2 99%  LMP 08/02/2014 (Exact Date)   Physical Exam  Constitutional: She is oriented to person, place, and time. She appears well-developed and well-nourished. No distress.  Patient fatigued appearing  HENT:  Head: Normocephalic and atraumatic.  Mouth/Throat: Oropharynx is clear and moist. No oropharyngeal exudate.  Uvula midline. Oropharynx clear. Symmetric rise of the uvula with phonation  Eyes: Conjunctivae  and EOM are normal. Pupils are equal, round, and reactive to light. No scleral icterus.  Pale sclera  Neck: Normal range of motion.  Cardiovascular: Regular rhythm and intact distal pulses.   HR 90-102bpm  Pulmonary/Chest: Effort normal and breath sounds normal. No respiratory distress. She has no wheezes. She has no rales.  She exhibits tenderness.  Diffuse TTP to chest wall. Chest expansion symmetric. No tachypnea or dyspnea noted.  Abdominal: Soft. She exhibits no distension. There is tenderness. There is no rebound and no guarding.  Mild epigastric TTP  Genitourinary:  Normal rectal tone. Brown stool appreciated on exam, free of gross blood.  Musculoskeletal: Normal range of motion.  Neurological: She is alert and oriented to person, place, and time. No cranial nerve deficit. She exhibits normal muscle tone. Coordination normal.  GCS 15. Speech is goal oriented. Patient exhibits no neurological neurologic deficits. She moves extremities without ataxia.  Skin: Skin is warm and dry. No rash noted. She is not diaphoretic. No erythema. No pallor.  Psychiatric: She has a normal mood and affect. Her behavior is normal.  Nursing note and vitals reviewed.   ED Course  Procedures (including critical care time) Labs Review Labs Reviewed  CBC - Abnormal; Notable for the following:    RBC 2.29 (*)    Hemoglobin 4.7 (*)    HCT 17.1 (*)    MCV 74.7 (*)    MCH 20.5 (*)    MCHC 27.5 (*)    RDW 16.8 (*)    Platelets 406 (*)    All other components within normal limits  COMPREHENSIVE METABOLIC PANEL - Abnormal; Notable for the following:    Sodium 134 (*)    Glucose, Bld 115 (*)    Creatinine, Ser 0.38 (*)    Calcium 8.0 (*)    Total Protein 5.7 (*)    AST 14 (*)    All other components within normal limits  LIPASE, BLOOD  PROTIME-INR  I-STAT TROPOININ, ED  POC OCCULT BLOOD, ED  I-STAT BETA HCG BLOOD, ED ([facility], [facility], [facility] ONLY)  TYPE AND SCREEN  PREPARE RBC (CROSSMATCH)  ABO/RH    Imaging Review Dg Chest 2 View  08/28/2014   CLINICAL DATA:  Chest pain  EXAM: CHEST  2 VIEW  COMPARISON:  None.  FINDINGS: Normal heart size and mediastinal contours. No acute infiltrate or edema. No effusion or pneumothorax. No acute osseous findings.  IMPRESSION: Negative chest.   Electronically Signed   By: Monte Fantasia M.D.    On: 08/28/2014 03:25   Ct Head Wo Contrast  08/28/2014   CLINICAL DATA:  Headache.  History of Still's disease.  EXAM: CT HEAD WITHOUT CONTRAST  TECHNIQUE: Contiguous axial images were obtained from the base of the skull through the vertex without intravenous contrast.  COMPARISON:  None.  FINDINGS: Skull and Sinuses:Negative for fracture or destructive process. The mastoids, middle ears, and imaged paranasal sinuses are clear.  Orbits: No acute abnormality.  Brain: No evidence of acute infarction, hemorrhage, hydrocephalus, or mass lesion/mass effect. 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  IMPRESSION: Negative head CT.   Electronically Signed   By: Monte Fantasia M.D.   On: 08/28/2014 04:08     EKG Interpretation   Date/Time:  Thursday Aug 27 2014 23:58:18 EDT Ventricular Rate:  102 PR Interval:  125 QRS Duration: 85 QT Interval:  320 QTC Calculation: 417 R Axis:   47 Text Interpretation:  Sinus tachycardia No old tracing to compare normal  intervals Confirmed by Kathrynn Humble, MD, Thelma Comp [id number]) on 08/28/2014 12:11:52  AM       CRITICAL CARE Performed by: Antonietta Breach   Total critical care time: 40  Critical care time was exclusive of separately billable procedures and treating other patients.  Critical care was necessary to treat or prevent imminent or life-threatening deterioration.  Critical care was time spent personally by me on the following activities: development of treatment plan with patient and/or surrogate as well as nursing, discussions with consultants, evaluation of patient's response to treatment, examination of patient, obtaining history from patient or surrogate, ordering and performing treatments and interventions, ordering and review of laboratory studies, ordering and review of radiographic studies, pulse oximetry and re-evaluation of patient's condition.  MDM   Final diagnoses:  Chest pain  Symptomatic anemia    39 year old  female presents to the emergency department for further evaluation of headache and central chest and epigastric pain which she describes as burning. Patient hemodynamically stable over ED course, but found to be anemic with a hemoglobin of 4.7. Symptoms likely secondary to symptomatic anemia. No evidence of ACS on workup. Also unable to determine cause of anemia today. Heme occult is negative. Patient denies hematuria or abnormal vaginal bleeding. LMP 08/02/2014. Transfusion ordered for 2 units of PRBCs. [facility] admit to [facility] for further evaluation and monitoring.   Filed Vitals:   08/27/14 2344 08/28/14 0234  BP: 119/57 114/66  Pulse: 100 83  Temp: 98.5 F (36.9 C) 99 F (37.2 C)  TempSrc: Oral Oral  Resp: 18 21  Height: 5\' 1"  (1.549 m)   Weight: 140 lb (63.504 kg)   SpO2: 98% 99%     Antonietta Breach, PA-C 08/28/14 [place], MD 08/28/14 [phone]

## 2014-08-28 NOTE — Care Management Note (Signed)
Case Management Note  Patient Details  Name: Kathy Rosales MRN: [id number] Date of Birth: Nov 27, 1975  Subjective/Objective:           Admitted with headache, found to have symptomatic anemia          Action/Plan: Discharge planning, f/u PCP appt made  Expected Discharge Date:                  Expected Discharge Plan:  Home/Self Care  In-House Referral:  NA  Discharge planning Services  CM Consult  Post Acute Care Choice:    Choice offered to:     DME Arranged:    DME Agency:     HH Arranged:    [place] Agency:     Status of Service:  Completed, signed off  Medicare Important Message Given:    Date Medicare IM Given:    Medicare IM give by:    Date Additional Medicare IM Given:    Additional Medicare Important Message give by:     If discussed at [place] of Stay Meetings, dates discussed:    Additional Comments:  Guadalupe Maple, RN 08/28/2014, 11:50 AM

## 2014-08-28 NOTE — Progress Notes (Signed)
Pt discharged to home.  Blood complete.  Vitals stable.  Denies pain.  Discharge instructions given in spanish.  Pt able to speak english.  No further questions at this time.  Family at bedside.  Pt has all belongings.  Nursing staff unable to schedule appointment with MD due to MD office being closed.  Explained to pt to call MD office on Tuesday morning to have appointment scheduled by Friday morning.  Pt verbalized understanding.  Iantha Fallen RN 7:14 PM  08/28/2014

## 2014-08-29 LAB — TYPE AND SCREEN
ABO/RH(D): O POS
Antibody Screen: NEGATIVE
Unit division: 0
Unit division: 0
Unit division: 0

## 2014-09-01 LAB — HEPATIC FUNCTION PANEL
ALT: 24 U/L (ref 7–35)
AST: 12 U/L — AB (ref 13–35)
Bilirubin, Total: 0.3 mg/dL

## 2014-09-01 LAB — CBC AND DIFFERENTIAL
HCT: 33 % — AB (ref 36–46)
Hemoglobin: 9.8 g/dL — AB (ref 12.0–16.0)
Platelets: 522 10*3/uL — AB (ref 150–399)
WBC: 10.7 10^3/mL

## 2014-09-01 LAB — BASIC METABOLIC PANEL
BUN: 16 mg/dL (ref 4–21)
Creatinine: 0.6 mg/dL (ref 0.5–1.1)
Glucose: 88 mg/dL
Potassium: 4.6 mmol/L (ref 3.4–5.3)
Sodium: 137 mmol/L (ref 137–147)

## 2014-09-30 ENCOUNTER — Telehealth: Payer: Self-pay | Admitting: Internal Medicine

## 2014-09-30 NOTE — Telephone Encounter (Signed)
new patient appt-s/w patient fiance' and gave np appt for 07/21 @ 11 w/dr. Julien Nordmann Referring Dr. 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

## 2014-10-12 LAB — CBC AND DIFFERENTIAL
HCT: 35 % — AB (ref 36–46)
Hemoglobin: 10.2 g/dL — AB (ref 12.0–16.0)
Platelets: 343 10*3/uL (ref 150–399)
WBC: 8.4 10^3/mL

## 2014-10-12 LAB — HEPATIC FUNCTION PANEL
AST: 14 U/L (ref 13–35)
Alkaline Phosphatase: 58 U/L (ref 25–125)
Bilirubin, Total: 0.2 mg/dL

## 2014-10-12 LAB — BASIC METABOLIC PANEL
BUN: 24 mg/dL — AB (ref 4–21)
Creatinine: 0.7 mg/dL (ref 0.5–1.1)
Glucose: 72 mg/dL
Potassium: 4.1 mmol/L (ref 3.4–5.3)
Sodium: 138 mmol/L (ref 137–147)

## 2014-10-21 ENCOUNTER — Telehealth: Payer: Self-pay | Admitting: Hematology

## 2014-10-21 ENCOUNTER — Other Ambulatory Visit: Payer: Self-pay | Admitting: Medical Oncology

## 2014-10-21 DIAGNOSIS — D649 Anemia, unspecified: Secondary | ICD-10-CM

## 2014-10-21 NOTE — Telephone Encounter (Signed)
Patient called to confirm appt for tomorrow 2:30 w/Dr. Irene Limbo

## 2014-10-21 NOTE — Telephone Encounter (Signed)
Left message patient and gave new arrival time and MD 07/21 @ 1 w/ Irene Limbo also spoke with [phone] and gave new arrival time for appt 07/21 @ 1 w/Dr. Irene Limbo.

## 2014-10-22 ENCOUNTER — Other Ambulatory Visit: Payer: Self-pay

## 2014-10-22 ENCOUNTER — Ambulatory Visit: Payer: Self-pay | Admitting: Hematology

## 2014-10-22 ENCOUNTER — Other Ambulatory Visit (HOSPITAL_BASED_OUTPATIENT_CLINIC_OR_DEPARTMENT_OTHER): Payer: Self-pay

## 2014-10-22 ENCOUNTER — Encounter: Payer: Self-pay | Admitting: Hematology

## 2014-10-22 ENCOUNTER — Ambulatory Visit: Payer: Self-pay | Admitting: Internal Medicine

## 2014-10-22 ENCOUNTER — Ambulatory Visit: Payer: Self-pay

## 2014-10-22 ENCOUNTER — Telehealth: Payer: Self-pay | Admitting: Hematology

## 2014-10-22 ENCOUNTER — Other Ambulatory Visit: Payer: Self-pay | Admitting: *Deleted

## 2014-10-22 ENCOUNTER — Ambulatory Visit (HOSPITAL_BASED_OUTPATIENT_CLINIC_OR_DEPARTMENT_OTHER): Payer: Self-pay | Admitting: Hematology

## 2014-10-22 VITALS — BP 134/84 | HR 85 | Temp 98.6°F | Resp 16 | Ht 61.0 in | Wt 137.2 lb

## 2014-10-22 DIAGNOSIS — D649 Anemia, unspecified: Secondary | ICD-10-CM

## 2014-10-22 DIAGNOSIS — D509 Iron deficiency anemia, unspecified: Secondary | ICD-10-CM

## 2014-10-22 DIAGNOSIS — M061 Adult-onset Still's disease: Secondary | ICD-10-CM

## 2014-10-22 DIAGNOSIS — E538 Deficiency of other specified B group vitamins: Secondary | ICD-10-CM

## 2014-10-22 LAB — COMPREHENSIVE METABOLIC PANEL (CC13)
ALT: 24 U/L (ref 0–55)
AST: 18 U/L (ref 5–34)
Albumin: 4.1 g/dL (ref 3.5–5.0)
Alkaline Phosphatase: 50 U/L (ref 40–150)
Anion Gap: 5 mEq/L (ref 3–11)
BUN: 16.8 mg/dL (ref 7.0–26.0)
CO2: 27 mEq/L (ref 22–29)
Calcium: 8.9 mg/dL (ref 8.4–10.4)
Chloride: 105 mEq/L (ref 98–109)
Creatinine: 0.7 mg/dL (ref 0.6–1.1)
EGFR: >90 mL/min/{1.73_m2} (ref >90)
Glucose: 84 mg/dl (ref 70–140)
Potassium: 4.4 mEq/L (ref 3.5–5.1)
Sodium: 137 mEq/L (ref 136–145)
Total Bilirubin: 0.31 mg/dL (ref 0.20–1.20)
Total Protein: 6.6 g/dL (ref 6.4–8.3)

## 2014-10-22 LAB — CBC WITH DIFFERENTIAL/PLATELET
BASO%: 0.3 % (ref 0.0–2.0)
Basophils Absolute: 0 10*3/uL (ref 0.0–0.1)
EOS%: 0.2 % (ref 0.0–7.0)
Eosinophils Absolute: 0 10*3/uL (ref 0.0–0.5)
HCT: 34 % — ABNORMAL LOW (ref 34.8–46.6)
HGB: 10.6 g/dL — ABNORMAL LOW (ref 11.6–15.9)
LYMPH%: 6.1 % — ABNORMAL LOW (ref 14.0–49.7)
MCH: 23.8 pg — ABNORMAL LOW (ref 25.1–34.0)
MCHC: 31.1 g/dL — ABNORMAL LOW (ref 31.5–36.0)
MCV: 76.7 fL — ABNORMAL LOW (ref 79.5–101.0)
MONO#: 0.4 10*3/uL (ref 0.1–0.9)
MONO%: 4.2 % (ref 0.0–14.0)
NEUT#: 8.8 10*3/uL — ABNORMAL HIGH (ref 1.5–6.5)
NEUT%: 89.2 % — ABNORMAL HIGH (ref 38.4–76.8)
Platelets: 322 10*3/uL (ref 145–400)
RBC: 4.43 10*6/uL (ref 3.70–5.45)
RDW: 22.1 % — ABNORMAL HIGH (ref 11.2–14.5)
WBC: 9.9 10*3/uL (ref 3.9–10.3)
lymph#: 0.6 10*3/uL — ABNORMAL LOW (ref 0.9–3.3)

## 2014-10-22 LAB — TECHNOLOGIST REVIEW

## 2014-10-22 MED ORDER — CYANOCOBALAMIN 1000 MCG/ML PO LIQD: 1000.0000 ug | Freq: Every day | ORAL | Status: DC

## 2014-10-22 MED ORDER — FERROUS SULFATE 325 (65 FE) MG PO TBEC: 325.0000 mg | DELAYED_RELEASE_TABLET | Freq: Three times a day (TID) | ORAL | Status: DC

## 2014-10-22 NOTE — Telephone Encounter (Signed)
Gave patient avs report and appointments for September. Per [facility]K/desk nurse no need to return for lab next week.

## 2014-10-22 NOTE — Patient Instructions (Signed)
Take oral iron and vitamin B12 as instructed Avoid gluten-containing foods -Discontinue use of NSAIDs -Return to clinic in 8 weeks with labs 1 week prior to clinic

## 2014-10-22 NOTE — Progress Notes (Signed)
Checked in new pt with no insurance.  She is not a [place] Citizen so she can't apply for Medicaid but I gave her an application to apply for a discount thru the hospital.

## 2014-10-22 NOTE — Progress Notes (Signed)
Marland Kitchen   CONSULT NOTE  Patient Care Team: No Pcp Per Patient as PCP - General (General Practice) Kathy Rosales Juleen China, MD as Consulting Physician (Hematology and Oncology) Dr. Unice Bailey [rheumatology]-[facility]   CHIEF COMPLAINTS/PURPOSE OF CONSULTATION:   "Patient with Still disease rule out aplastic anemia"  HISTORY OF PRESENTING ILLNESS:  Kathy Rosales is a very pleasant 39 year old Hispanic female with a history of adult-onset Still disease diagnosed in 2010 under the treatment of Dr. Unice Bailey has been referred for consultation regarding her anemia.  Patient was noted to have mild anemia with a hemoglobin of 11.3 and a normal MCV of 89.7 on 01/05/2014. She has progressively developed anemia with a hemoglobin of 10.1 on 05/11/2014. She subsequently presented to the emergency room on 08/28/2014 with severe symptomatic anemia with a hemoglobin of 4.2 MCV of 75. Her WBC count was normal at 6.3 and platelets were 344k suggesting only anemia and not the picture of pancytopenia. The absence of pancytopenia makes aplastic anemia unlikely. She did have a anemia workup for her microcytic anemia and was noted to have a severely iron deficient with a ferritin of 2, iron saturation of 1%, low B12 at 223, hemolytic workup including bilirubin, LDH, Coombs' test were all negative. She had no overt evidence of bleeding. Reticulocyte count was somewhat low in keeping with her significant iron deficiency state and the fact that she was also methotrexate at the time. She was given 2 units of PRBCs with improvement in her symptoms. She has since been off her methotrexate and is currently only on prednisone 10 mg for her Still disease and uses tramadol when necessary for pain. She notes no evidence of new lumps or bumps, abdominal pain, black stools, blood in stools, hematuria, nasal bleeding, frequent infections.  Does endorse symptoms of pica with significant amount of ice eating,  increased hair loss, restless legs, fatigue and feeling of asthenia. No tingling or numbness in her hands or feet or gait instability.  No unexplained weight loss, night sweats, fevers or chills.  Patient has been taking daily Advil for body aches and headaches for about 5 months which in addition to prednisone, tramadol and methotrexate certainly increases the risk of upper GI or got ulceration and possible bleeding.  She also endorses diarrhea with gluten rich foods like bread which brings up the possibility of celiac sprue given her severe iron deficiency as well as somewhat low vitamin B12 levels.  She has not been on oral iron previously. No blood disorders in the family. No history of childhood anemia. No previous history of GI bleeding. No history of ulcers. No organic chemical exposures like benzene or toluene   MEDICAL HISTORY:  Past Medical History  Diagnosis Date  . Still's disease    smoker Symptoms suggestive of celiac disease  SURGICAL HISTORY: No past surgical history on file.  SOCIAL HISTORY: History   Social History  . Marital Status: Married    Spouse Name: N/A  . Number of Children: N/A  . Years of Education: N/A   Occupational History  . Not on file.   Social History Main Topics  . Smoking status: Current Some Day Smoker    Types: Cigarettes  . Smokeless tobacco: Not on file  . Alcohol Use: No  . Drug Use: No  . Sexual Activity: Yes    Birth Control/ Protection: None   Other Topics Concern  . Not on file   Social History Narrative    FAMILY HISTORY: Family History  Problem Relation Age of Onset  . Cancer Paternal Grandfather     ALLERGIES:  has No Known Allergies.  MEDICATIONS:  Current Outpatient Prescriptions  Medication Sig Dispense Refill  . Cyanocobalamin 1000 MCG/ML LIQD Place 1,000 mcg under the tongue daily. 100 mL 1  . ferrous sulfate 325 (65 FE) MG EC tablet Take 1 tablet (325 mg total) by mouth 3 (three) times daily with  meals. 90 tablet 3  . predniSONE (DELTASONE) 10 MG tablet Take 10 mg by mouth 3 (three) times daily.     . traMADol (ULTRAM) 50 MG tablet Take 50 mg by mouth every 6 (six) hours as needed for moderate pain.     No current facility-administered medications for this visit.    REVIEW OF SYSTEMS:   Appropriate positive and negative review of systems as noted in history of present illness  Rest of the 10 point review of systems done and negative other than as indicated above.  PHYSICAL EXAMINATION: ECOG PERFORMANCE STATUS: 1 - Symptomatic but completely ambulatory  Filed Vitals:   10/22/14 1531  BP: 134/84  Pulse: 85  Temp: 98.6 F (37 C)  Resp: 16   Filed Weights   10/22/14 1531  Weight: 137 lb 3.2 oz (62.234 kg)    GENERAL:alert, no distress and comfortable SKIN: skin color, texture, turgor are normal, no rashes or significant lesions EYES: normal, conjunctiva are pink and non-injected, sclera clear OROPHARYNX:no exudate, no erythema and lips, buccal mucosa, and tongue normal  NECK: supple, thyroid normal size, non-tender, without nodularity LYMPH:  no palpable lymphadenopathy in the cervical, axillary or inguinal LUNGS: clear to auscultation and percussion with normal breathing effort HEART: regular rate & rhythm and no murmurs and no lower extremity edema ABDOMEN:abdomen soft, non-tender and normal bowel sounds Musculoskeletal:no cyanosis of digits and no clubbing  PSYCH: alert & oriented x 3 with fluent speech NEURO: no focal motor/sensory deficits  LABORATORY DATA:  I have reviewed the data as listed Recent Results (from the past 2160 hour(s))  I-stat troponin, ED  (not at [facility], [facility])     Status: None   Collection Time: 08/28/14 12:51 AM  Result Value Ref Range   Troponin i, poc 0.01 0.00 - 0.08 ng/mL   Comment 3            Comment: Due to the release kinetics of cTnI, a negative result within the first hours of the onset of symptoms does not rule out myocardial  infarction with certainty. If myocardial infarction is still suspected, repeat the test at appropriate intervals.   Protime-INR     Status: None   Collection Time: 08/28/14  3:08 AM  Result Value Ref Range   Prothrombin Time 13.7 11.6 - 15.2 seconds   INR 1.03 0.00 - 1.49  CBC     Status: Abnormal   Collection Time: 08/28/14  3:09 AM  Result Value Ref Range   WBC 7.6 4.0 - 10.5 K/uL   RBC 2.29 (L) 3.87 - 5.11 MIL/uL   Hemoglobin 4.7 (LL) 12.0 - 15.0 g/dL    Comment: REPEATED TO VERIFY RESULTS VERIFIED VIA RECOLLECT CRITICAL RESULT CALLED TO, READ BACK BY AND VERIFIED WITH: A WARD RN 0330 08/28/14 A NAVARRO    HCT 17.1 (L) 36.0 - 46.0 %   MCV 74.7 (L) 78.0 - 100.0 fL   MCH 20.5 (L) 26.0 - 34.0 pg   MCHC 27.5 (L) 30.0 - 36.0 g/dL   RDW 16.8 (H) 11.5 - 15.5 %   Platelets  406 (H) 150 - 400 K/uL  Comprehensive metabolic panel     Status: Abnormal   Collection Time: 08/28/14  3:09 AM  Result Value Ref Range   Sodium 134 (L) 135 - 145 mmol/L   Potassium 3.7 3.5 - 5.1 mmol/L   Chloride 104 101 - 111 mmol/L   CO2 24 22 - 32 mmol/L   Glucose, Bld 115 (H) 65 - 99 mg/dL   BUN 14 6 - 20 mg/dL   Creatinine, Ser 0.38 (L) 0.44 - 1.00 mg/dL   Calcium 8.0 (L) 8.9 - 10.3 mg/dL   Total Protein 5.7 (L) 6.5 - 8.1 g/dL   Albumin 3.7 3.5 - 5.0 g/dL   AST 14 (L) 15 - 41 U/L   ALT 17 14 - 54 U/L   Alkaline Phosphatase 40 38 - 126 U/L   Total Bilirubin 0.5 0.3 - 1.2 mg/dL   GFR calc non Af Amer >60 >60 mL/min   GFR calc Af Amer >60 >60 mL/min    Comment: (NOTE) The eGFR has been calculated using the CKD EPI equation. This calculation has not been validated in all clinical situations. eGFR's persistently <60 mL/min signify possible Chronic Kidney Disease.    Anion gap 6 5 - 15  Lipase, blood     Status: None   Collection Time: 08/28/14  3:09 AM  Result Value Ref Range   Lipase 24 22 - 51 U/L  ABO/Rh     Status: None   Collection Time: 08/28/14  4:05 AM  Result Value Ref Range    ABO/RH(D) O POS   Type and screen     Status: None   Collection Time: 08/28/14  4:07 AM  Result Value Ref Range   ABO/RH(D) O POS    Antibody Screen NEG    Sample Expiration 08/31/2014    Unit Number [id number]    Blood Component Type RED CELLS,LR    Unit division 00    Status of Unit ISSUED,FINAL    Transfusion Status OK TO TRANSFUSE    Crossmatch Result Compatible    Unit Number [id number]    Blood Component Type RED CELLS,LR    Unit division 00    Status of Unit ISSUED,FINAL    Transfusion Status OK TO TRANSFUSE    Crossmatch Result Compatible    Unit Number [id number]    Blood Component Type RBC LR PHER1    Unit division 00    Status of Unit ISSUED,FINAL    Transfusion Status OK TO TRANSFUSE    Crossmatch Result Compatible   I-Stat Beta hCG blood, ED ([facility], [facility], [facility] only)     Status: None   Collection Time: 08/28/14  4:26 AM  Result Value Ref Range   I-stat hCG, quantitative <5.0 <5 mIU/mL   Comment 3            Comment:   GEST. AGE      CONC.  (mIU/mL)   <=1 WEEK        5 - 50     2 WEEKS       50 - 500     3 WEEKS       100 - 10,000     4 WEEKS     1,000 - 30,000        FEMALE AND NON-PREGNANT FEMALE:     LESS THAN 5 mIU/mL   POC occult blood, ED Provider will collect     Status: None   Collection Time: 08/28/14  4:53  AM  Result Value Ref Range   Fecal Occult Bld NEGATIVE NEGATIVE  Prepare RBC (crossmatch)     Status: None   Collection Time: 08/28/14  5:00 AM  Result Value Ref Range   Order Confirmation ORDER PROCESSED BY BLOOD BANK   CBC with Differential/Platelet     Status: Abnormal   Collection Time: 08/28/14  5:48 AM  Result Value Ref Range   WBC 6.3 4.0 - 10.5 K/uL   RBC 2.08 (L) 3.87 - 5.11 MIL/uL   Hemoglobin 4.2 (LL) 12.0 - 15.0 g/dL    Comment: REPEATED TO VERIFY CRITICAL VALUE NOTED.  VALUE IS CONSISTENT WITH PREVIOUSLY REPORTED AND CALLED VALUE.    HCT 15.6 (L) 36.0 - 46.0 %   MCV 75.0 (L) 78.0 - 100.0 fL   MCH 20.2 (L) 26.0 - 34.0 pg    MCHC 26.9 (L) 30.0 - 36.0 g/dL   RDW 16.8 (H) 11.5 - 15.5 %   Platelets 344 150 - 400 K/uL   Neutrophils Relative % 80 (H) 43 - 77 %   Neutro Abs 5.0 1.7 - 7.7 K/uL   Lymphocytes Relative 13 12 - 46 %   Lymphs Abs 0.8 0.7 - 4.0 K/uL   Monocytes Relative 7 3 - 12 %   Monocytes Absolute 0.4 0.1 - 1.0 K/uL   Eosinophils Relative 1 0 - 5 %   Eosinophils Absolute 0.0 0.0 - 0.7 K/uL   Basophils Relative 0 0 - 1 %   Basophils Absolute 0.0 0.0 - 0.1 K/uL  Reticulocytes     Status: Abnormal   Collection Time: 08/28/14  5:48 AM  Result Value Ref Range   Retic Ct Pct 3.1 0.4 - 3.1 %   RBC. 2.08 (L) 3.87 - 5.11 MIL/uL   Retic Count, Manual 64.5 19.0 - 186.0 K/uL  Lactate dehydrogenase     Status: None   Collection Time: 08/28/14  5:48 AM  Result Value Ref Range   LDH 126 98 - 192 U/L  Ferritin     Status: Abnormal   Collection Time: 08/28/14  5:48 AM  Result Value Ref Range   Ferritin 2 (L) 11 - 307 ng/mL    Comment: Performed at [facility]  Iron and TIBC     Status: Abnormal   Collection Time: 08/28/14  5:48 AM  Result Value Ref Range   Iron 5 (L) 28 - 170 ug/dL   TIBC 399 250 - 450 ug/dL   Saturation Ratios 1 (L) 10.4 - 31.8 %   UIBC 394 ug/dL    Comment: Performed at [facility]  Vitamin B12     Status: None   Collection Time: 08/28/14  5:48 AM  Result Value Ref Range   Vitamin B-12 223 180 - 914 pg/mL    Comment: (NOTE) This assay is not validated for testing neonatal or myeloproliferative syndrome specimens for Vitamin B12 levels. Performed at [facility]   Folate     Status: None   Collection Time: 08/28/14  5:48 AM  Result Value Ref Range   Folate 13.6 >5.9 ng/mL    Comment: Performed at [facility]  Direct antiglobulin test (not at [facility])     Status: None   Collection Time: 08/28/14  5:52 AM  Result Value Ref Range   DAT, complement NEG    DAT, IgG NEG   Prepare RBC     Status: None   Collection Time: 08/28/14  5:52 AM  Result Value  Ref Range  Order Confirmation ORDER PROCESSED BY BLOOD BANK   Prepare RBC     Status: None   Collection Time: 08/28/14  8:39 AM  Result Value Ref Range   Order Confirmation ORDER PROCESSED BY BLOOD BANK   CBC with Differential/Platelet     Status: Abnormal   Collection Time: 10/22/14  3:16 PM  Result Value Ref Range   WBC 9.9 3.9 - 10.3 10e3/uL   NEUT# 8.8 (H) 1.5 - 6.5 10e3/uL   HGB 10.6 (L) 11.6 - 15.9 g/dL   HCT 34.0 (L) 34.8 - 46.6 %   Platelets 322 145 - 400 10e3/uL   MCV 76.7 (L) 79.5 - 101.0 fL   MCH 23.8 (L) 25.1 - 34.0 pg   MCHC 31.1 (L) 31.5 - 36.0 g/dL   RBC 4.43 3.70 - 5.45 10e6/uL   RDW 22.1 (H) 11.2 - 14.5 %   lymph# 0.6 (L) 0.9 - 3.3 10e3/uL   MONO# 0.4 0.1 - 0.9 10e3/uL   Eosinophils Absolute 0.0 0.0 - 0.5 10e3/uL   Basophils Absolute 0.0 0.0 - 0.1 10e3/uL   NEUT% 89.2 (H) 38.4 - 76.8 %   LYMPH% 6.1 (L) 14.0 - 49.7 %   MONO% 4.2 0.0 - 14.0 %   EOS% 0.2 0.0 - 7.0 %   BASO% 0.3 0.0 - 2.0 %  TECHNOLOGIST REVIEW     Status: None   Collection Time: 10/22/14  3:16 PM  Result Value Ref Range   Technologist Review Rare Metas and Myelocytes present, few ovalocytes    Comprehensive metabolic panel     Status: None   Collection Time: 10/22/14  3:16 PM  Result Value Ref Range   Sodium 137 136 - 145 mEq/L   Potassium 4.4 3.5 - 5.1 mEq/L   Chloride 105 98 - 109 mEq/L   CO2 27 22 - 29 mEq/L   Glucose 84 70 - 140 mg/dl   BUN 16.8 7.0 - 26.0 mg/dL   Creatinine 0.7 0.6 - 1.1 mg/dL   Total Bilirubin 0.31 0.20 - 1.20 mg/dL   Alkaline Phosphatase 50 40 - 150 U/L   AST 18 5 - 34 U/L   ALT 24 0 - 55 U/L   Total Protein 6.6 6.4 - 8.3 g/dL   Albumin 4.1 3.5 - 5.0 g/dL   Calcium 8.9 8.4 - 10.4 mg/dL   Anion Gap 5 3 - 11 mEq/L   EGFR >90 >90 ml/min/1.73 m2    Comment: eGFR is calculated using the CKD-EPI Creatinine Equation (2009)    RADIOGRAPHIC STUDIES: I have personally reviewed the radiological images as listed and agreed with the findings in the report. No results  found.  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 Patient has no history of anemia and was having almost MCV previously that rules out the chances of thalassemia or other hemoglobinopathy. She has overt severe iron deficiency anemia with a ferritin of 2 and iron saturation 1% noted on labs on 08/28/2014. Patient denies heavy menses or overt GI bleeding, GI bleeding epistaxis or other overt signs of bleeding. She is on several medications that increase her risk of GI ulceration and bleeding and this might likely have been the source. Part of the anemia could also be related to methotrexate therapy that can cause bone marrow suppression. Lack of pancytopenia makes aplastic anemia unlikely. Patient has normal WBC and platelet counts. Plan -Patient has not  previously been on iron replacement orally. We'll start the patient  on oral ferrous sulfate 2-3 times a day with orange juice. If she cannot tolerate this due to GI symptoms or her ferritin does not improve significantly we might consider IV iron replacement. -Will start on sublingual vitamin B12 to avoid triggering B12 deficiency with accelerated erythropoiesis. -Patient's hemoglobin has improved off methotrexate and with iron that she received from her 2 units of PRBC transfusion that would have given her close to 500 milligrams of iron. -Considering concomitant iron and B12 deficiency - achlorhydria/hypochlorhydria related to autoimmune oxyntic gastritis versus celiac disease would be concerns. -In the absence of a clear source of bleeding would recommend getting an EGD  colonoscopy. -To reduce the risk of GI ulceration and bleeding patient was recommended discontinuation NSAIDs, discussion with rheumatology to use minimum amount of prednisone that is effective, discussion to consider switching to alternative pain medication since  tramadol can cause some platelet dysfunction. Methotrexate was also a factor in causing mucosal ulceration but patient is off of this for about 6 weeks which is helpful.  -No other focal symptoms suggestive of other area of blood loss. -We'll check for celiac panel on next labs on follow-up.  #2  Vitamin B12 levels low normal which might be an issue once her bone marrow revs up with iron replacement. We will repeat B12 levels on sublingual B12 in 7-8 weeks.   Return to clinic with Dr. Irene Limbo in 8 weeks with labs CBC, CMP, ferritin, reticulocyte count, celiac panel, B12 1 week prior to follow-up   I appreciate the privilege of being able to care for this patient. Thank you for the consultation.  All questions were answered. The patient knows to call the clinic with any problems, questions or concerns. I spent 40 minutes counseling the patient face to face. The total time spent in the appointment was 60 minutes and more than 50% was on counseling.     Sullivan Lone, MD 10/22/2014 5:05 PM  Hematology oncology physician  [place]

## 2014-12-17 ENCOUNTER — Other Ambulatory Visit (HOSPITAL_BASED_OUTPATIENT_CLINIC_OR_DEPARTMENT_OTHER): Payer: Self-pay

## 2014-12-17 DIAGNOSIS — E538 Deficiency of other specified B group vitamins: Secondary | ICD-10-CM

## 2014-12-17 DIAGNOSIS — D509 Iron deficiency anemia, unspecified: Secondary | ICD-10-CM

## 2014-12-17 LAB — CBC & DIFF AND RETIC
BASO%: 0.1 % (ref 0.0–2.0)
Basophils Absolute: 0 10*3/uL (ref 0.0–0.1)
EOS%: 0.2 % (ref 0.0–7.0)
Eosinophils Absolute: 0 10*3/uL (ref 0.0–0.5)
HCT: 34.1 % — ABNORMAL LOW (ref 34.8–46.6)
HGB: 10.7 g/dL — ABNORMAL LOW (ref 11.6–15.9)
Immature Retic Fract: 14.5 % — ABNORMAL HIGH (ref 1.60–10.00)
LYMPH%: 5.3 % — ABNORMAL LOW (ref 14.0–49.7)
MCH: 27.9 pg (ref 25.1–34.0)
MCHC: 31.4 g/dL — ABNORMAL LOW (ref 31.5–36.0)
MCV: 88.8 fL (ref 79.5–101.0)
MONO#: 0.5 10*3/uL (ref 0.1–0.9)
MONO%: 4.4 % (ref 0.0–14.0)
NEUT#: 9.8 10*3/uL — ABNORMAL HIGH (ref 1.5–6.5)
NEUT%: 90 % — ABNORMAL HIGH (ref 38.4–76.8)
Platelets: 283 10*3/uL (ref 145–400)
RBC: 3.84 10*6/uL (ref 3.70–5.45)
RDW: 18.2 % — ABNORMAL HIGH (ref 11.2–14.5)
Retic %: 1.7 % (ref 0.70–2.10)
Retic Ct Abs: 65.28 10*3/uL (ref 33.70–90.70)
WBC: 10.9 10*3/uL — ABNORMAL HIGH (ref 3.9–10.3)
lymph#: 0.6 10*3/uL — ABNORMAL LOW (ref 0.9–3.3)

## 2014-12-17 LAB — COMPREHENSIVE METABOLIC PANEL (CC13)
ALT: 22 U/L (ref 0–55)
AST: 15 U/L (ref 5–34)
Albumin: 4.1 g/dL (ref 3.5–5.0)
Alkaline Phosphatase: 54 U/L (ref 40–150)
Anion Gap: 7 mEq/L (ref 3–11)
BUN: 15.3 mg/dL (ref 7.0–26.0)
CO2: 24 mEq/L (ref 22–29)
Calcium: 8.8 mg/dL (ref 8.4–10.4)
Chloride: 109 mEq/L (ref 98–109)
Creatinine: 0.7 mg/dL (ref 0.6–1.1)
EGFR: >90 mL/min/{1.73_m2} (ref >90)
Glucose: 105 mg/dl (ref 70–140)
Potassium: 4.2 mEq/L (ref 3.5–5.1)
Sodium: 141 mEq/L (ref 136–145)
Total Bilirubin: <0.2 mg/dL (ref 0.20–1.20)
Total Protein: 6.6 g/dL (ref 6.4–8.3)

## 2014-12-18 LAB — GLIA (IGA/G) + TTG IGA
Deamidated Gliadin Abs, IgG: 5 U
Gliadin IgA: 3 U
Tissue Transglutaminase Ab, IgA: 1 U/mL

## 2014-12-18 LAB — FERRITIN CHCC: Ferritin: 14 ng/ml (ref 9–269)

## 2014-12-18 LAB — VITAMIN B12: Vitamin B-12: 626 pg/mL (ref 211–911)

## 2014-12-24 ENCOUNTER — Ambulatory Visit (HOSPITAL_BASED_OUTPATIENT_CLINIC_OR_DEPARTMENT_OTHER): Payer: Self-pay | Admitting: Hematology

## 2014-12-24 ENCOUNTER — Encounter: Payer: Self-pay | Admitting: Hematology

## 2014-12-24 ENCOUNTER — Telehealth: Payer: Self-pay | Admitting: Hematology

## 2014-12-24 VITALS — BP 131/80 | HR 94 | Temp 98.8°F | Resp 18 | Ht 61.0 in | Wt 143.1 lb

## 2014-12-24 DIAGNOSIS — E538 Deficiency of other specified B group vitamins: Secondary | ICD-10-CM

## 2014-12-24 DIAGNOSIS — D509 Iron deficiency anemia, unspecified: Secondary | ICD-10-CM

## 2014-12-24 NOTE — Telephone Encounter (Signed)
Gave and printd appt sched and avs for pt for OCT and NOV °

## 2015-01-01 NOTE — Progress Notes (Signed)
.   HEMATOLOGY CLINIC NOTE  Patient Care Team: No Pcp Per Patient as PCP - General (General Practice)  Kishore , MD as Consulting Physician (Hematology and Oncology) Dr. Anthony Anderson [rheumatology]-[facility]   CHIEF COMPLAINTS/PURPOSE OF CONSULTATION: Follow-up for management of anemia  DIAGNOSIS: 1. Multifactorial Anemia -  predominantly related to iron deficiency + chronic inflammation + methotrexate + B12 deficiency   Current Treatment:  Oral ferrous sulfate 1 tablet 3 times a day   Interval History  Patient is here for follow-up of her anemia. She notes that she still feels quite fatigued. She tried to take the oral iron which causes constipation and some nausea. Stop taking iron 2 weeks ago .Ferritin level is up from 2 to 14 after 2 months of treatment which is suboptimal . Hemoglobin remains in the mid 10 range and hasn't normalized. Celiac disease panel was negative. No overt GI bleeding. Stools are black because of oral iron. He'll hematuria. No epistaxis. No history of heavy periods. She also notes that she is up taken to B12 due to headaches.   MEDICAL HISTORY:  Past Medical History  Diagnosis Date  . 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 No pertinent past surgical history.  SOCIAL HISTORY: Social History   Social History  . Marital Status: Married    Spouse Name: N/A  . Number of Children: N/A  . Years of Education: N/A   Occupational History  . Not on file.   Social History Main Topics  . Smoking status: Current Some Day Smoker    Types: Cigarettes  . Smokeless tobacco: Not on file  . Alcohol Use: No  . Drug Use: No  . Sexual Activity: Yes    Birth Control/ Protection: None   Other Topics Concern  . Not on file   Social History Narrative    FAMILY HISTORY: Family History  Problem Relation Age of Onset  . Cancer Paternal Grandfather      ALLERGIES:  is allergic to leflunomide.  MEDICATIONS:  Current Outpatient Prescriptions  Medication Sig Dispense Refill  . predniSONE (DELTASONE) 10 MG tablet Take 10 mg by mouth 3 (three) times daily.     . traMADol (ULTRAM) 50 MG tablet Take 50 mg by mouth every 6 (six) hours as needed for moderate pain.    . Cyanocobalamin 1000 MCG/ML LIQD Place 1,000 mcg under the tongue daily. (Patient not taking: Reported on 12/24/2014) 100 mL 1  . ferrous sulfate 325 (65 FE) MG EC tablet Take 1 tablet (325 mg total) by mouth 3 (three) times daily with meals. (Patient not taking: Reported on 12/24/2014) 90 tablet 3   No current facility-administered medications for this visit.    REVIEW OF SYSTEMS:   Appropriate positive and negative review of systems as noted in history of present illness  Rest of the 10 point review of systems done and negative other than as indicated above.  PHYSICAL EXAMINATION: ECOG PERFORMANCE STATUS: 1 - Symptomatic but completely ambulatory  Filed Vitals:   12/24/14 0839  BP: 131/80  Pulse: 94  Temp: 98.8 F (37.1 C)  Resp: 18   Filed Weights   12/24/14 0839  Weight: 143 lb 1.6 oz (64.91 kg)    GENERAL:alert, no distress and comfortable SKIN: skin color, texture, turgor are normal, no rashes or significant lesions EYES: normal, conjunctiva are pink and non-injected, sclera clear OROPHARYNX:no exudate, no erythema and lips, buccal mucosa, and tongue normal    NECK: supple, thyroid normal size, non-tender, without nodularity LYMPH:  no palpable lymphadenopathy in the cervical, axillary or inguinal LUNGS: clear to auscultation and percussion with normal breathing effort HEART: regular rate & rhythm and no murmurs and no lower extremity edema ABDOMEN:abdomen soft, non-tender and normal bowel sounds Musculoskeletal:no cyanosis of digits and no clubbing  PSYCH: alert & oriented x 3 with fluent speech NEURO: no focal motor/sensory deficits  LABORATORY DATA:  I  have reviewed the data as listed  . CBC Latest Ref Rng 12/17/2014 10/22/2014 08/28/2014  WBC 3.9 - 10.3 10e3/uL 10.9(H) 9.9 6.3  Hemoglobin 11.6 - 15.9 g/dL 10.7(L) 10.6(L) 4.2(LL)  Hematocrit 34.8 - 46.6 % 34.1(L) 34.0(L) 15.6(L)  Platelets 145 - 400 10e3/uL 283 322 344     Recent Results (from the past 2160 hour(s))  CBC with Differential/Platelet     Status: Abnormal   Collection Time: 10/22/14  3:16 PM  Result Value Ref Range   WBC 9.9 3.9 - 10.3 10e3/uL   NEUT# 8.8 (H) 1.5 - 6.5 10e3/uL   HGB 10.6 (L) 11.6 - 15.9 g/dL   HCT 34.0 (L) 34.8 - 46.6 %   Platelets 322 145 - 400 10e3/uL   MCV 76.7 (L) 79.5 - 101.0 fL   MCH 23.8 (L) 25.1 - 34.0 pg   MCHC 31.1 (L) 31.5 - 36.0 g/dL   RBC 4.43 3.70 - 5.45 10e6/uL   RDW 22.1 (H) 11.2 - 14.5 %   lymph# 0.6 (L) 0.9 - 3.3 10e3/uL   MONO# 0.4 0.1 - 0.9 10e3/uL   Eosinophils Absolute 0.0 0.0 - 0.5 10e3/uL   Basophils Absolute 0.0 0.0 - 0.1 10e3/uL   NEUT% 89.2 (H) 38.4 - 76.8 %   LYMPH% 6.1 (L) 14.0 - 49.7 %   MONO% 4.2 0.0 - 14.0 %   EOS% 0.2 0.0 - 7.0 %   BASO% 0.3 0.0 - 2.0 %  TECHNOLOGIST REVIEW     Status: None   Collection Time: 10/22/14  3:16 PM  Result Value Ref Range   Technologist Review Rare Metas and Myelocytes present, few ovalocytes    Comprehensive metabolic panel     Status: None   Collection Time: 10/22/14  3:16 PM  Result Value Ref Range   Sodium 137 136 - 145 mEq/L   Potassium 4.4 3.5 - 5.1 mEq/L   Chloride 105 98 - 109 mEq/L   CO2 27 22 - 29 mEq/L   Glucose 84 70 - 140 mg/dl   BUN 16.8 7.0 - 26.0 mg/dL   Creatinine 0.7 0.6 - 1.1 mg/dL   Total Bilirubin 0.31 0.20 - 1.20 mg/dL   Alkaline Phosphatase 50 40 - 150 U/L   AST 18 5 - 34 U/L   ALT 24 0 - 55 U/L   Total Protein 6.6 6.4 - 8.3 g/dL   Albumin 4.1 3.5 - 5.0 g/dL   Calcium 8.9 8.4 - 10.4 mg/dL   Anion Gap 5 3 - 11 mEq/L   EGFR >90 >90 ml/min/1.73 m2    Comment: eGFR is calculated using the CKD-EPI Creatinine Equation (2009)  Ferritin     Status: None    Collection Time: 12/17/14  4:05 PM  Result Value Ref Range   Ferritin 14 9 - 269 ng/ml  CBC & Diff and Retic     Status: Abnormal   Collection Time: 12/17/14  4:05 PM  Result Value Ref Range   WBC 10.9 (H) 3.9 - 10.3 10e3/uL   NEUT# 9.8 (H) 1.5 - 6.5  10e3/uL   HGB 10.7 (L) 11.6 - 15.9 g/dL   HCT 34.1 (L) 34.8 - 46.6 %   Platelets 283 145 - 400 10e3/uL   MCV 88.8 79.5 - 101.0 fL   MCH 27.9 25.1 - 34.0 pg   MCHC 31.4 (L) 31.5 - 36.0 g/dL   RBC 3.84 3.70 - 5.45 10e6/uL   RDW 18.2 (H) 11.2 - 14.5 %   lymph# 0.6 (L) 0.9 - 3.3 10e3/uL   MONO# 0.5 0.1 - 0.9 10e3/uL   Eosinophils Absolute 0.0 0.0 - 0.5 10e3/uL   Basophils Absolute 0.0 0.0 - 0.1 10e3/uL   NEUT% 90.0 (H) 38.4 - 76.8 %   LYMPH% 5.3 (L) 14.0 - 49.7 %   MONO% 4.4 0.0 - 14.0 %   EOS% 0.2 0.0 - 7.0 %   BASO% 0.1 0.0 - 2.0 %   Retic % 1.70 0.70 - 2.10 %   Retic Ct Abs 65.28 33.70 - 90.70 10e3/uL   Immature Retic Fract 14.50 (H) 1.60 - 10.00 %  Vitamin B12     Status: None   Collection Time: 12/17/14  4:05 PM  Result Value Ref Range   Vitamin B-12 626 211 - 911 pg/mL  Comprehensive metabolic panel     Status: None   Collection Time: 12/17/14  4:05 PM  Result Value Ref Range   Sodium 141 136 - 145 mEq/L   Potassium 4.2 3.5 - 5.1 mEq/L   Chloride 109 98 - 109 mEq/L   CO2 24 22 - 29 mEq/L   Glucose 105 70 - 140 mg/dl    Comment: Glucose reference range is for nonfasting patients. Fasting glucose reference range is 70- 100.   BUN 15.3 7.0 - 26.0 mg/dL   Creatinine 0.7 0.6 - 1.1 mg/dL   Total Bilirubin <0.20 0.20 - 1.20 mg/dL   Alkaline Phosphatase 54 40 - 150 U/L   AST 15 5 - 34 U/L   ALT 22 0 - 55 U/L   Total Protein 6.6 6.4 - 8.3 g/dL   Albumin 4.1 3.5 - 5.0 g/dL   Calcium 8.8 8.4 - 10.4 mg/dL   Anion Gap 7 3 - 11 mEq/L   EGFR >90 >90 ml/min/1.73 m2    Comment: eGFR is calculated using the CKD-EPI Creatinine Equation (2009)  Celiac panel     Status: None   Collection Time: 12/17/14  4:29 PM  Result Value Ref  Range   Tissue Transglutaminase Ab, IgA 1 <4 U/mL    Comment: Value Interpretation:  <4:   Antibody Not Detected >=4:   Antibody Detected   Gliadin IgG 5 <20 Units    Comment: Value Interpretation: <20:   Antibody Not Detected>=20:   Antibody Detected   Gliadin IgA 3 <20 Units    Comment: Value Interpretation: <20:   Antibody Not Detected>=20:   Antibody Detected    RADIOGRAPHIC STUDIES: I have personally reviewed the radiological images as listed and agreed with the findings in the report. No results found.  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 Patient has no history of anemia and was having almost normal MCV previously that rules out the possibility of thalassemia or other hemoglobinopathy. She had overt severe iron deficiency anemia with a ferritin of 2 and iron saturation 1% noted on labs on 08/28/2014. somewhat of a 14 after 2 months of aggressive oral ferrous sulfate treatment. Patient is   not tolerating oral iron well. Still with significant fatigue.  Patient notes no overt blood loss area. Celiac panel is negative.  Part of the anemia could no evidence of related to methotrexate therapy that can cause bone marrow suppression.  #2 B12 deficiency patient is off oral well due to reported headaches  #3 Still's disease Plan -Discussed in detail the pros and cons of replacing the patient's iron IV.  -The talked about the risk of allergic reactions and the possible benefits from rapid iron deficiency correction. Patient chooses to proceed with IV Feraheme ordered for 510 mg IV every weekly 2 doses. -Given her chronic inflammatory state we shall targeted ferritin levels more than 100 due to the presence of coexisting functional iron deficiency from chronic inflammation . -Recheck B12 on next visit. -In the absence of a clear source of bleeding would recommend  getting an EGD  colonoscopy. (will defer to PCP to decide this). -To reduce the risk of GI ulceration and bleeding patient was recommended discontinuation NSAIDs, discussion with rheumatology to use minimum amount of prednisone that is effective, discussion to consider switching to alternative pain medication since tramadol can cause some platelet dysfunction.   Return to clinic with Dr. Irene Limbo in 8 weeks with labs CBC, CMP, ferritin, reticulocyte count, B12 to assess response to IV iron.  I appreciate the privilege of being able to care for this patient. Thank you for the consultation.  All questions were answered. The patient knows to call the clinic with any problems, questions or concerns. I spent 15 minutes counseling the patient face to face. The total time spent in the appointment was 20 minutes and more than 50% was on counseling.     Sullivan Lone, MD  Hematology oncology physician  [place]

## 2015-01-06 ENCOUNTER — Ambulatory Visit (HOSPITAL_BASED_OUTPATIENT_CLINIC_OR_DEPARTMENT_OTHER): Payer: Self-pay

## 2015-01-06 VITALS — BP 122/56 | HR 88 | Temp 98.1°F | Resp 18

## 2015-01-06 DIAGNOSIS — D509 Iron deficiency anemia, unspecified: Secondary | ICD-10-CM

## 2015-01-06 MED ORDER — SODIUM CHLORIDE 0.9 % IV SOLN
510.0000 mg | Freq: Once | INTRAVENOUS | Status: AC
  Administered 2015-01-06: 510 mg via INTRAVENOUS
  Filled 2015-01-06: qty 17

## 2015-01-06 MED ORDER — SODIUM CHLORIDE 0.9 % IV SOLN
Freq: Once | INTRAVENOUS | Status: AC
  Administered 2015-01-06: 16:00:00 via INTRAVENOUS

## 2015-01-06 NOTE — Patient Instructions (Signed)

## 2015-01-13 ENCOUNTER — Ambulatory Visit (HOSPITAL_BASED_OUTPATIENT_CLINIC_OR_DEPARTMENT_OTHER): Payer: Self-pay

## 2015-01-13 VITALS — BP 130/85 | HR 98 | Temp 98.0°F | Resp 18

## 2015-01-13 DIAGNOSIS — D509 Iron deficiency anemia, unspecified: Secondary | ICD-10-CM

## 2015-01-13 MED ORDER — SODIUM CHLORIDE 0.9 % IV SOLN
510.0000 mg | Freq: Once | INTRAVENOUS | Status: AC
  Administered 2015-01-13: 510 mg via INTRAVENOUS
  Filled 2015-01-13: qty 17

## 2015-01-13 MED ORDER — SODIUM CHLORIDE 0.9 % IJ SOLN
3.0000 mL | Freq: Once | INTRAMUSCULAR | Status: DC | PRN
  Filled 2015-01-13: qty 10

## 2015-01-13 MED ORDER — HEPARIN SOD (PORK) LOCK FLUSH 100 UNIT/ML IV SOLN
500.0000 [IU] | Freq: Once | INTRAVENOUS | Status: DC | PRN
  Filled 2015-01-13: qty 5

## 2015-01-13 MED ORDER — ALTEPLASE 2 MG IJ SOLR
2.0000 mg | Freq: Once | INTRAMUSCULAR | Status: DC | PRN
  Filled 2015-01-13: qty 2

## 2015-01-13 MED ORDER — SODIUM CHLORIDE 0.9 % IV SOLN
Freq: Once | INTRAVENOUS | Status: AC
  Administered 2015-01-13: 16:00:00 via INTRAVENOUS

## 2015-01-13 MED ORDER — SODIUM CHLORIDE 0.9 % IJ SOLN
10.0000 mL | INTRAMUSCULAR | Status: DC | PRN
  Filled 2015-01-13: qty 10

## 2015-01-13 MED ORDER — HEPARIN SOD (PORK) LOCK FLUSH 100 UNIT/ML IV SOLN
250.0000 [IU] | Freq: Once | INTRAVENOUS | Status: DC | PRN
  Filled 2015-01-13: qty 5

## 2015-01-18 ENCOUNTER — Ambulatory Visit: Payer: Self-pay

## 2015-02-17 ENCOUNTER — Telehealth: Payer: Self-pay | Admitting: Hematology

## 2015-02-17 NOTE — Telephone Encounter (Signed)
returned call and confirmed appt.Marland KitchenMarland KitchenMarland KitchenMarland Kitchenpt need to know why there is an appt...i transferred to desk nurse

## 2015-02-18 ENCOUNTER — Ambulatory Visit (HOSPITAL_BASED_OUTPATIENT_CLINIC_OR_DEPARTMENT_OTHER): Payer: Self-pay | Admitting: Hematology

## 2015-02-18 ENCOUNTER — Encounter: Payer: Self-pay | Admitting: Hematology

## 2015-02-18 ENCOUNTER — Telehealth: Payer: Self-pay | Admitting: Hematology

## 2015-02-18 ENCOUNTER — Other Ambulatory Visit (HOSPITAL_BASED_OUTPATIENT_CLINIC_OR_DEPARTMENT_OTHER): Payer: Self-pay

## 2015-02-18 VITALS — BP 145/85 | HR 96 | Temp 98.2°F | Resp 16

## 2015-02-18 DIAGNOSIS — D649 Anemia, unspecified: Secondary | ICD-10-CM

## 2015-02-18 DIAGNOSIS — E538 Deficiency of other specified B group vitamins: Secondary | ICD-10-CM

## 2015-02-18 DIAGNOSIS — D509 Iron deficiency anemia, unspecified: Secondary | ICD-10-CM

## 2015-02-18 DIAGNOSIS — D6489 Other specified anemias: Secondary | ICD-10-CM

## 2015-02-18 LAB — COMPREHENSIVE METABOLIC PANEL (CC13)
ALT: 19 U/L (ref 0–55)
AST: 14 U/L (ref 5–34)
Albumin: 4 g/dL (ref 3.5–5.0)
Alkaline Phosphatase: 55 U/L (ref 40–150)
Anion Gap: 8 mEq/L (ref 3–11)
BUN: 15.4 mg/dL (ref 7.0–26.0)
CO2: 26 mEq/L (ref 22–29)
Calcium: 8.9 mg/dL (ref 8.4–10.4)
Chloride: 108 mEq/L (ref 98–109)
Creatinine: 0.7 mg/dL (ref 0.6–1.1)
EGFR: >90 mL/min/{1.73_m2} (ref >90)
Glucose: 78 mg/dl (ref 70–140)
Potassium: 3.7 mEq/L (ref 3.5–5.1)
Sodium: 141 mEq/L (ref 136–145)
Total Bilirubin: <0.3 mg/dL (ref 0.20–1.20)
Total Protein: 6.1 g/dL — ABNORMAL LOW (ref 6.4–8.3)

## 2015-02-18 LAB — CBC & DIFF AND RETIC
BASO%: 0.2 % (ref 0.0–2.0)
Basophils Absolute: 0 10*3/uL (ref 0.0–0.1)
EOS%: 0.6 % (ref 0.0–7.0)
Eosinophils Absolute: 0.1 10*3/uL (ref 0.0–0.5)
HCT: 31.2 % — ABNORMAL LOW (ref 34.8–46.6)
HGB: 9.2 g/dL — ABNORMAL LOW (ref 11.6–15.9)
Immature Retic Fract: 21.3 % — ABNORMAL HIGH (ref 1.60–10.00)
LYMPH%: 9.9 % — ABNORMAL LOW (ref 14.0–49.7)
MCH: 26.9 pg (ref 25.1–34.0)
MCHC: 29.5 g/dL — ABNORMAL LOW (ref 31.5–36.0)
MCV: 91.2 fL (ref 79.5–101.0)
MONO#: 0.8 10*3/uL (ref 0.1–0.9)
MONO%: 6.6 % (ref 0.0–14.0)
NEUT#: 9.5 10*3/uL — ABNORMAL HIGH (ref 1.5–6.5)
NEUT%: 82.7 % — ABNORMAL HIGH (ref 38.4–76.8)
Platelets: 336 10*3/uL (ref 145–400)
RBC: 3.42 10*6/uL — ABNORMAL LOW (ref 3.70–5.45)
RDW: 18.3 % — ABNORMAL HIGH (ref 11.2–14.5)
Retic %: 2.19 % — ABNORMAL HIGH (ref 0.70–2.10)
Retic Ct Abs: 74.9 10*3/uL (ref 33.70–90.70)
WBC: 11.5 10*3/uL — ABNORMAL HIGH (ref 3.9–10.3)
lymph#: 1.1 10*3/uL (ref 0.9–3.3)

## 2015-02-18 LAB — VITAMIN B12: Vitamin B-12: 671 pg/mL (ref 211–911)

## 2015-02-18 LAB — IRON AND TIBC CHCC
%SAT: 3 % — ABNORMAL LOW (ref 21–57)
Iron: 12 ug/dL — ABNORMAL LOW (ref 41–142)
TIBC: 356 ug/dL (ref 236–444)
UIBC: 344 ug/dL (ref 120–384)

## 2015-02-18 LAB — FERRITIN CHCC: Ferritin: 29 ng/ml (ref 9–269)

## 2015-02-18 MED ORDER — B COMPLEX VITAMINS PO CAPS: 1.0000 | ORAL_CAPSULE | Freq: Every day | ORAL | Status: DC

## 2015-02-18 NOTE — Telephone Encounter (Signed)
per pof to sch pt appt-gave pt copy of avs °

## 2015-03-10 NOTE — Progress Notes (Signed)
Kathy Rosales   HEMATOLOGY CLINIC NOTE  Patient Care Team: No Pcp Per Patient as PCP - General (General Practice) Kayona Foor Juleen China, MD as Consulting Physician (Hematology and Oncology) Dr[facility] Un[facility] Bailey [facility]   CHIEF COMPLAINTS/PURPOSE OF CONSULTATION: Follow-up for management of anemia  DIAGNOSIS: 1. Multifactorial Anemia -  predominantly related to iron deficiency + chronic inflammation + methotrexate + B12 deficiency   Current Treatment:  Oral ferrous sulfate 1 tablet 3 times a day   Interval History  Patient is here for follow-up of her anemia. She notes that her energy levels have significantly improved since receiving the IV iron and she feels much better. She has been off the methotrexate for about 6 months now. CBC shows that her hemoglobin is still in the mid 9 range but her microcytic indices have normalized. Tolerated the IV iron without any concerns. No other acute new symptoms. No evidence of overt bleeding. Ferritin/Iron profile not available at the time of clinic appointment.  MEDICAL HISTORY:  Past Medical History  Diagnosis Date  . 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[place])    smoker Symptoms suggestive of celiac disease  SURGICAL HISTORY: History reviewed. No pertinent past surgical history.  SOCIAL HISTORY: Social History   Social History  . Marital Status: Married    Spouse Name: N/A  . Number of Children: N/A  . Years of Education: N/A   Occupational History  . Not on file.   Social History Main Topics  . Smoking status: Current Some Day Smoker    Types: Cigarettes  . Smokeless tobacco: Not on file  . Alcohol Use: No  . Drug Use: No  . Sexual Activity: Yes    Birth Control/ Protection: None   Other Topics Concern  . Not on file   Social History Narrative    FAMILY HISTORY: Family History  Problem Relation Age of Onset  . Cancer Paternal Grandfather     ALLERGIES:  is allergic to leflunomide.  MEDICATIONS:  Current  Outpatient Prescriptions  Medication Sig Dispense Refill  . Cyanocobalamin 1000 MCG/ML LIQD Place 1,000 mcg under the tongue daily. 100 mL 1  . ferrous sulfate 325 (65 FE) MG EC tablet Take 1 tablet (325 mg total) by mouth 3 (three) times daily with meals. 90 tablet 3  . predniSONE (DELTASONE) 10 MG tablet Take 10 mg by mouth 3 (three) times daily.     . traMADol (ULTRAM) 50 MG tablet Take 50 mg by mouth every 6 (six) hours as needed for moderate pain.    Kathy Rosales b complex vitamins capsule Take 1 capsule by mouth daily. 60 capsule 6   No current facility-administered medications for this visit.    REVIEW OF SYSTEMS:   Appropriate positive and negative review of systems as noted in history of present illness  Rest of the 10 point review of systems done and negative other than as indicated above.  PHYSICAL EXAMINATION: ECOG PERFORMANCE STATUS: 1 - Symptomatic but completely ambulatory  Filed Vitals:   02/18/15 0851  BP: 145/85  Pulse: 96  Temp: 98.2 F (36.8 C)  Resp: 16   There were no vitals filed for this visit.  GENERAL:alert, no distress and comfortable SKIN: skin color, texture, turgor are normal, no rashes or significant lesions EYES: normal, conjunctiva are pink and non-injected, sclera clear OROPHARYNX:no exudate, no erythema and lips, buccal mucosa, and tongue normal  NECK: supple, thyroid normal size, non-tender, without nodularity LYMPH:  no palpable lymphadenopathy in the cervical, axillary or inguinal LUNGS: clear to  auscultation and percussion with normal breathing effort HEART: regular rate & rhythm and no murmurs and no lower extremity edema ABDOMEN:abdomen soft, non-tender and normal bowel sounds Musculoskeletal:no cyanosis of digits and no clubbing  PSYCH: alert & oriented x 3 with fluent speech NEURO: no focal motor/sensory deficits  LABORATORY DATA:  I have reviewed the data as listed  . CBC Latest Ref Rng 02/18/2015 12/17/2014 10/22/2014  WBC 3.9 - 10.3  10e3/uL 11.5(H) 10.9(H) 9.9  Hemoglobin 11.6 - 15.9 g/dL 9.2(L) 10.7(L) 10.6(L)  Hematocrit 34.8 - 46.6 % 31.2(L) 34.1(L) 34.0(L)  Platelets 145 - 400 10e3/uL 336 283 322     Recent Results (from the past 2160 hour(s))  Ferritin     Status: None   Collection Time: 12/17/14  4:05 PM  Result Value Ref Range   Ferritin 14 9 - 269 ng/ml  CBC & Diff and Retic     Status: Abnormal   Collection Time: 12/17/14  4:05 PM  Result Value Ref Range   WBC 10.9 (H) 3.9 - 10.3 10e3/uL   NEUT# 9.8 (H) 1.5 - 6.5 10e3/uL   HGB 10.7 (L) 11.6 - 15.9 g/dL   HCT 34.1 (L) 34.8 - 46.6 %   Platelets 283 145 - 400 10e3/uL   MCV 88.8 79.5 - 101.0 fL   MCH 27.9 25.1 - 34.0 pg   MCHC 31.4 (L) 31.5 - 36.0 g/dL   RBC 3.84 3.70 - 5.45 10e6/uL   RDW 18.2 (H) 11.2 - 14.5 %   lymph# 0.6 (L) 0.9 - 3.3 10e3/uL   MONO# 0.5 0.1 - 0.9 10e3/uL   Eosinophils Absolute 0.0 0.0 - 0.5 10e3/uL   Basophils Absolute 0.0 0.0 - 0.1 10e3/uL   NEUT% 90.0 (H) 38.4 - 76.8 %   LYMPH% 5.3 (L) 14.0 - 49.7 %   MONO% 4.4 0.0 - 14.0 %   EOS% 0.2 0.0 - 7.0 %   BASO% 0.1 0.0 - 2.0 %   Retic % 1.70 0.70 - 2.10 %   Retic Ct Abs 65.28 33.70 - 90.70 10e3/uL   Immature Retic Fract 14.50 (H) 1.60 - 10.00 %  Vitamin B12     Status: None   Collection Time: 12/17/14  4:05 PM  Result Value Ref Range   Vitamin B-12 626 211 - 911 pg/mL  Comprehensive metabolic panel     Status: None   Collection Time: 12/17/14  4:05 PM  Result Value Ref Range   Sodium 141 136 - 145 mEq/L   Potassium 4.2 3.5 - 5.1 mEq/L   Chloride 109 98 - 109 mEq/L   CO2 24 22 - 29 mEq/L   Glucose 105 70 - 140 mg/dl    Comment: Glucose reference range is for nonfasting patients. Fasting glucose reference range is 70- 100.   BUN 15.3 7.0 - 26.0 mg/dL   Creatinine 0.7 0.6 - 1.1 mg/dL   Total Bilirubin <0.20 0.20 - 1.20 mg/dL   Alkaline Phosphatase 54 40 - 150 U/L   AST 15 5 - 34 U/L   ALT 22 0 - 55 U/L   Total Protein 6.6 6.4 - 8.3 g/dL   Albumin 4.1 3.5 - 5.0 g/dL    Calcium 8.8 8.4 - 10.4 mg/dL   Anion Gap 7 3 - 11 mEq/L   EGFR >90 >90 ml/min/1.73 m2    Comment: eGFR is calculated using the CKD-EPI Creatinine Equation (2009)  Celiac panel     Status: None   Collection Time: 12/17/14  4:29 PM  Result  Value Ref Range   Tissue Transglutaminase Ab, IgA 1 <4 U/mL    Comment: Value Interpretation:  <4:   Antibody Not Detected >=4:   Antibody Detected   Gliadin IgG 5 <20 Units    Comment: Value Interpretation: <20:   Antibody Not Detected>=20:   Antibody Detected   Gliadin IgA 3 <20 Units    Comment: Value Interpretation: <20:   Antibody Not Detected>=20:   Antibody Detected  CBC & Diff and Retic     Status: Abnormal   Collection Time: 02/18/15  8:08 AM  Result Value Ref Range   WBC 11.5 (H) 3.9 - 10.3 10e3/uL   NEUT# 9.5 (H) 1.5 - 6.5 10e3/uL   HGB 9.2 (L) 11.6 - 15.9 g/dL   HCT 31.2 (L) 34.8 - 46.6 %   Platelets 336 145 - 400 10e3/uL   MCV 91.2 79.5 - 101.0 fL   MCH 26.9 25.1 - 34.0 pg   MCHC 29.5 (L) 31.5 - 36.0 g/dL   RBC 3.42 (L) 3.70 - 5.45 10e6/uL   RDW 18.3 (H) 11.2 - 14.5 %   lymph# 1.1 0.9 - 3.3 10e3/uL   MONO# 0.8 0.1 - 0.9 10e3/uL   Eosinophils Absolute 0.1 0.0 - 0.5 10e3/uL   Basophils Absolute 0.0 0.0 - 0.1 10e3/uL   NEUT% 82.7 (H) 38.4 - 76.8 %   LYMPH% 9.9 (L) 14.0 - 49.7 %   MONO% 6.6 0.0 - 14.0 %   EOS% 0.6 0.0 - 7.0 %   BASO% 0.2 0.0 - 2.0 %   Retic % 2.19 (H) 0.70 - 2.10 %   Retic Ct Abs 74.90 33.70 - 90.70 10e3/uL   Immature Retic Fract 21.30 (H) 1.60 - 10.00 %  Ferritin     Status: None   Collection Time: 02/18/15  8:08 AM  Result Value Ref Range   Ferritin 29 9 - 269 ng/ml  Iron and TIBC     Status: Abnormal   Collection Time: 02/18/15  8:08 AM  Result Value Ref Range   Iron 12 (L) 41 - 142 ug/dL   TIBC 356 236 - 444 ug/dL   UIBC 344 120 - 384 ug/dL   %SAT 3 (L) 21 - 57 %  Comprehensive metabolic panel     Status: Abnormal   Collection Time: 02/18/15  8:08 AM  Result Value Ref Range   Sodium 141 136 - 145 mEq/L    Potassium 3.7 3.5 - 5.1 mEq/L   Chloride 108 98 - 109 mEq/L   CO2 26 22 - 29 mEq/L   Glucose 78 70 - 140 mg/dl    Comment: Glucose reference range is for nonfasting patients. Fasting glucose reference range is 70- 100.   BUN 15.4 7.0 - 26.0 mg/dL   Creatinine 0.7 0.6 - 1.1 mg/dL   Total Bilirubin <0.30 0.20 - 1.20 mg/dL   Alkaline Phosphatase 55 40 - 150 U/L   AST 14 5 - 34 U/L   ALT 19 0 - 55 U/L   Total Protein 6.1 (L) 6.4 - 8.3 g/dL   Albumin 4.0 3.5 - 5.0 g/dL   Calcium 8.9 8.4 - 10.4 mg/dL   Anion Gap 8 3 - 11 mEq/L   EGFR >90 >90 ml/min/1.73 m2    Comment: eGFR is calculated using the CKD-EPI Creatinine Equation (2009)  Vitamin B12     Status: None   Collection Time: 02/18/15  8:08 AM  Result Value Ref Range   Vitamin B-12 671 211 - 911 pg/mL   .  Lab Results  Component Value Date   IRON 12* 02/18/2015   TIBC 356 02/18/2015   IRONPCTSAT 3* 02/18/2015   (Iron and TIBC)  Lab Results  Component Value Date   FERRITIN 29 02/18/2015     RADIOGRAPHIC STUDIES: I have personally reviewed the radiological images as listed and agreed with the findings in the report. No results found.  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 Patient has no history of anemia and was having almost normal MCV previously that rules out the possibility of thalassemia or other hemoglobinopathy. She had overt severe iron deficiency anemia with a ferritin of 2 and iron saturation 1% noted on labs on 08/28/2014. Somewhat better at 14 after 2 months of aggressive oral ferrous sulfate treatment.  No overt bleeding. Celiac panel is negative. Patient feels much better after the IV iron and notes that her energy level are much improved. Ferritin levels improve to 29 with iron saturation of 3% but still lower than the target of >100 in the setting of chronic inflammation  #2 B12  deficiency - patient notes that she did not take it due to headaches. B12 level WNL.on last visit and stable at 671 on labs today.  #3 Still's disease Plan -patient feels much better after the IV iron. -ferritin/Iron profile not available at the time of clinic visit. Since these indices are still suboptimal will offer the patient the option of getting additional IV feraheme 2 more doses v/s additional iron replacement orally. -IV feraheme ordered placed and I have requested my RN to contact patient with iron lab results and to offer scheduling additional IV feraheme. -would recommend getting an EGD  colonoscopy. (will defer to PCP to decide this). -To reduce the risk of GI ulceration and bleeding patient was recommended discontinuation NSAIDs, discussion with rheumatology to use minimum amount of prednisone that is effective, discussion to consider switching to alternative pain medication since tramadol can cause some platelet dysfunction.   Return to clinic with Dr. Irene Limbo in 12 weeks with labs CBC, CMP, ferritin, reticulocyte count.  I appreciate the privilege of being able to care for this patient. Thank you for the consultation.  All questions were answered. The patient knows to call the clinic with any problems, questions or concerns. I spent 15 minutes counseling the patient face to face. The total time spent in the appointment was 20 minutes and more than 50% was on counseling.     Sullivan Lone, MD  Hematology oncology physician  [place]

## 2015-03-11 ENCOUNTER — Other Ambulatory Visit: Payer: Self-pay | Admitting: *Deleted

## 2015-03-11 ENCOUNTER — Telehealth: Payer: Self-pay | Admitting: *Deleted

## 2015-03-11 ENCOUNTER — Telehealth: Payer: Self-pay | Admitting: Hematology

## 2015-03-11 NOTE — Telephone Encounter (Signed)
Per staff message and POF I have scheduled appts. Advised scheduler of appts. JMW  

## 2015-03-11 NOTE — Telephone Encounter (Signed)
Called patient to inform her about recent labs and that she needs to have 2 feraheme infusions schedule. Left voicemail to call cancer center back.

## 2015-03-11 NOTE — Telephone Encounter (Signed)
per pof tos ch pt appt-sent MW email to sch trmt-will call pt after reply °

## 2015-03-12 ENCOUNTER — Telehealth: Payer: Self-pay | Admitting: *Deleted

## 2015-03-16 ENCOUNTER — Telehealth: Payer: Self-pay | Admitting: *Deleted

## 2015-03-16 NOTE — Telephone Encounter (Signed)
Pt called to cancel upcoming appointments for feraheme infusion due to family issues.  Pt stated she will call after the new year to reschedule.  Instructed patient to inform us if any concerns arise in the meantime.  Pt verbalized understanding.

## 2015-03-17 ENCOUNTER — Ambulatory Visit: Payer: Self-pay

## 2015-03-24 ENCOUNTER — Ambulatory Visit: Payer: Self-pay

## 2015-05-20 ENCOUNTER — Other Ambulatory Visit: Payer: Self-pay

## 2015-05-20 ENCOUNTER — Ambulatory Visit: Payer: Self-pay | Admitting: Hematology

## 2015-05-24 NOTE — Telephone Encounter (Signed)
err

## 2015-07-20 ENCOUNTER — Other Ambulatory Visit: Payer: Self-pay | Admitting: *Deleted

## 2015-07-20 DIAGNOSIS — D649 Anemia, unspecified: Secondary | ICD-10-CM

## 2015-07-21 ENCOUNTER — Other Ambulatory Visit: Payer: Self-pay

## 2015-07-21 ENCOUNTER — Emergency Department (HOSPITAL_COMMUNITY): Payer: Medicaid Other

## 2015-07-21 ENCOUNTER — Encounter (HOSPITAL_COMMUNITY): Payer: Self-pay | Admitting: Emergency Medicine

## 2015-07-21 ENCOUNTER — Other Ambulatory Visit (HOSPITAL_BASED_OUTPATIENT_CLINIC_OR_DEPARTMENT_OTHER): Payer: Self-pay

## 2015-07-21 ENCOUNTER — Inpatient Hospital Stay (HOSPITAL_COMMUNITY)
Admission: EM | Admit: 2015-07-21 | Discharge: 2015-07-23 | DRG: 812 | Disposition: A | Discharge status: Home / Self Care | Payer: Medicaid Other | Attending: Internal Medicine | Admitting: Internal Medicine

## 2015-07-21 DIAGNOSIS — Z7952 Long term (current) use of systemic steroids: Secondary | ICD-10-CM

## 2015-07-21 DIAGNOSIS — D649 Anemia, unspecified: Secondary | ICD-10-CM

## 2015-07-21 DIAGNOSIS — F17210 Nicotine dependence, cigarettes, uncomplicated: Secondary | ICD-10-CM | POA: Diagnosis present

## 2015-07-21 DIAGNOSIS — Z79899 Other long term (current) drug therapy: Secondary | ICD-10-CM

## 2015-07-21 DIAGNOSIS — Z888 Allergy status to other drugs, medicaments and biological substances status: Secondary | ICD-10-CM

## 2015-07-21 DIAGNOSIS — Z720 Tobacco use: Secondary | ICD-10-CM

## 2015-07-21 DIAGNOSIS — R6881 Early satiety: Secondary | ICD-10-CM | POA: Diagnosis present

## 2015-07-21 DIAGNOSIS — E538 Deficiency of other specified B group vitamins: Secondary | ICD-10-CM

## 2015-07-21 DIAGNOSIS — K259 Gastric ulcer, unspecified as acute or chronic, without hemorrhage or perforation: Secondary | ICD-10-CM | POA: Diagnosis present

## 2015-07-21 DIAGNOSIS — D638 Anemia in other chronic diseases classified elsewhere: Secondary | ICD-10-CM

## 2015-07-21 DIAGNOSIS — M0820 Juvenile rheumatoid arthritis with systemic onset, unspecified site: Secondary | ICD-10-CM

## 2015-07-21 DIAGNOSIS — Z9119 Patient's noncompliance with other medical treatment and regimen: Secondary | ICD-10-CM

## 2015-07-21 DIAGNOSIS — Z7982 Long term (current) use of aspirin: Secondary | ICD-10-CM

## 2015-07-21 DIAGNOSIS — M069 Rheumatoid arthritis, unspecified: Secondary | ICD-10-CM

## 2015-07-21 DIAGNOSIS — D509 Iron deficiency anemia, unspecified: Principal | ICD-10-CM

## 2015-07-21 LAB — COMPREHENSIVE METABOLIC PANEL
ALT: 12 U/L (ref 0–55)
AST: 11 U/L (ref 5–34)
Albumin: 3.6 g/dL (ref 3.5–5.0)
Alkaline Phosphatase: 67 U/L (ref 40–150)
Anion Gap: 7 mEq/L (ref 3–11)
BUN: 12.7 mg/dL (ref 7.0–26.0)
CO2: 26 mEq/L (ref 22–29)
Calcium: 8.4 mg/dL (ref 8.4–10.4)
Chloride: 107 mEq/L (ref 98–109)
Creatinine: 0.6 mg/dL (ref 0.6–1.1)
EGFR: >90 mL/min/{1.73_m2} (ref >90)
Glucose: 98 mg/dl (ref 70–140)
Potassium: 4 mEq/L (ref 3.5–5.1)
Sodium: 140 mEq/L (ref 136–145)
Total Bilirubin: <0.3 mg/dL (ref 0.20–1.20)
Total Protein: 5.9 g/dL — ABNORMAL LOW (ref 6.4–8.3)

## 2015-07-21 LAB — CBC & DIFF AND RETIC
BASO%: 0.3 % (ref 0.0–2.0)
Basophils Absolute: 0 10*3/uL (ref 0.0–0.1)
EOS%: 0.4 % (ref 0.0–7.0)
Eosinophils Absolute: 0 10*3/uL (ref 0.0–0.5)
HCT: 17.6 % — ABNORMAL LOW (ref 34.8–46.6)
HGB: 4.8 g/dL — CL (ref 11.6–15.9)
Immature Retic Fract: 17.2 % — ABNORMAL HIGH (ref 1.60–10.00)
LYMPH%: 5.5 % — ABNORMAL LOW (ref 14.0–49.7)
MCH: 17.3 pg — ABNORMAL LOW (ref 25.1–34.0)
MCHC: 27.2 g/dL — ABNORMAL LOW (ref 31.5–36.0)
MCV: 63.7 fL — ABNORMAL LOW (ref 79.5–101.0)
MONO#: 0.5 10*3/uL (ref 0.1–0.9)
MONO%: 5.5 % (ref 0.0–14.0)
NEUT#: 8.2 10*3/uL — ABNORMAL HIGH (ref 1.5–6.5)
NEUT%: 88.3 % — ABNORMAL HIGH (ref 38.4–76.8)
Platelets: 533 10*3/uL — ABNORMAL HIGH (ref 145–400)
RBC: 2.77 10*6/uL — ABNORMAL LOW (ref 3.70–5.45)
RDW: 19 % — ABNORMAL HIGH (ref 11.2–14.5)
Retic %: 2.8 % — ABNORMAL HIGH (ref 0.70–2.10)
Retic Ct Abs: 77.56 10*3/uL (ref 33.70–90.70)
WBC: 9.3 10*3/uL (ref 3.9–10.3)
lymph#: 0.5 10*3/uL — ABNORMAL LOW (ref 0.9–3.3)
nRBC: 1 % — ABNORMAL HIGH (ref 0–0)

## 2015-07-21 LAB — IRON AND TIBC
Iron: <11 ug/dL — ABNORMAL LOW (ref 41–142)
TIBC: 390 ug/dL (ref 236–444)

## 2015-07-21 LAB — I-STAT TROPONIN, ED: Troponin i, poc: 0.01 ng/mL (ref 0.00–0.08)

## 2015-07-21 LAB — PROTIME-INR
INR: 1.06 (ref 0.00–1.49)
Prothrombin Time: 13.6 seconds (ref 11.6–15.2)

## 2015-07-21 LAB — PREPARE RBC (CROSSMATCH)

## 2015-07-21 LAB — FERRITIN: Ferritin: 5 ng/ml — ABNORMAL LOW (ref 9–269)

## 2015-07-21 LAB — VITAMIN B12: Vitamin B-12: 312 pg/mL (ref 180–914)

## 2015-07-21 MED ORDER — FUROSEMIDE 10 MG/ML IJ SOLN
20.0000 mg | Freq: Once | INTRAMUSCULAR | Status: AC
Start: 2015-07-21 — End: 2015-07-21
  Administered 2015-07-21: 20 mg via INTRAVENOUS
  Filled 2015-07-21: qty 2

## 2015-07-21 MED ORDER — ONDANSETRON HCL 4 MG/2ML IJ SOLN: 4.0000 mg | Freq: Four times a day (QID) | INTRAMUSCULAR | Status: DC | PRN

## 2015-07-21 MED ORDER — SODIUM CHLORIDE 0.9 % IV SOLN
Freq: Once | INTRAVENOUS | Status: AC
  Administered 2015-07-21: 20:00:00 via INTRAVENOUS

## 2015-07-21 MED ORDER — SODIUM CHLORIDE 0.9 % IV SOLN
510.0000 mg | Freq: Once | INTRAVENOUS | Status: AC
  Administered 2015-07-22: 510 mg via INTRAVENOUS
  Filled 2015-07-21: qty 17

## 2015-07-21 MED ORDER — SODIUM CHLORIDE 0.9 % IV SOLN
10.0000 mL/h | Freq: Once | INTRAVENOUS | Status: AC
  Administered 2015-07-22: 10 mL/h via INTRAVENOUS

## 2015-07-21 MED ORDER — ONDANSETRON HCL 4 MG PO TABS: 4.0000 mg | ORAL_TABLET | Freq: Four times a day (QID) | ORAL | Status: DC | PRN

## 2015-07-21 MED ORDER — TRAMADOL HCL 50 MG PO TABS
50.0000 mg | ORAL_TABLET | Freq: Four times a day (QID) | ORAL | Status: DC | PRN
  Administered 2015-07-22 – 2015-07-23 (×2): 50 mg via ORAL
  Filled 2015-07-21 (×2): qty 1

## 2015-07-21 MED ORDER — PANTOPRAZOLE SODIUM 40 MG PO TBEC
40.0000 mg | DELAYED_RELEASE_TABLET | Freq: Every day | ORAL | Status: DC
  Administered 2015-07-22 – 2015-07-23 (×2): 40 mg via ORAL
  Filled 2015-07-21 (×2): qty 1

## 2015-07-21 MED ORDER — ACETAMINOPHEN 325 MG PO TABS
650.0000 mg | ORAL_TABLET | Freq: Four times a day (QID) | ORAL | Status: DC | PRN
  Administered 2015-07-21 – 2015-07-23 (×4): 650 mg via ORAL
  Filled 2015-07-21 (×5): qty 2

## 2015-07-21 MED ORDER — PREDNISONE 5 MG PO TABS
10.0000 mg | ORAL_TABLET | Freq: Every day | ORAL | Status: DC
  Administered 2015-07-22 – 2015-07-23 (×2): 10 mg via ORAL
  Filled 2015-07-21 (×2): qty 2

## 2015-07-21 MED ORDER — ACETAMINOPHEN 650 MG RE SUPP: 650.0000 mg | Freq: Four times a day (QID) | RECTAL | Status: DC | PRN

## 2015-07-21 MED ORDER — FUROSEMIDE 10 MG/ML IJ SOLN
20.0000 mg | Freq: Once | INTRAMUSCULAR | Status: AC
  Administered 2015-07-22: 20 mg via INTRAVENOUS
  Filled 2015-07-21: qty 2

## 2015-07-21 NOTE — ED Notes (Signed)
I notified Dr. Broadus John for further information regarding transfusion.  She states she changed order for transfusion of TWO units of prbc's to THREE units of prbc's.  Pt. Remains in no distress.  She further re-iterated she wants pt. To receive 20 mg of Lasix after the first unit; and 20mg  Lasix after the second unit.

## 2015-07-21 NOTE — Progress Notes (Signed)
Hematology/Oncology short note  Patient was seen in the clinic and then was accompanied to the emergency room. She was treated by for multifactorial anemia related to iron deficiency from heavy periods, anemia of chronic disease related to her rheumatoid arthritis and from previous methotrexate therapy.  Patient had missed her IV iron infusions and also missed a couple of clinic follow-ups. She was here for follow-up today noting increased fatigue. Labs showed severe anemia with a hemoglobin of 4.8 with significant microcytosis with an MCV in the 60s and a ferritin level of 5. She notes that she has been using over-the-counter NSAIDs as well as Goody powder and has been on chronic prednisone for rheumatoid arthritis. She notes no sign of any new abdominal pain. No overt GI bleeding. He does not report that her periods have been worse.  Was referred to the emergency room for likely admission for PRBC blood transfusions, IV Feraheme, evaluation of source of possible blood loss. Fecal occult blood testing. Consideration of GI consultation for EGD plus or minus colonoscopy due to high risk for gastrointestinal ulceration with her combination of NSAIDs and steroids. We will need additional IV iron replacement as an outpatient after discharge. Appreciate help from the ED physician at [facility] team. Burnis Medin continue to follow in the hospital.  Sullivan Lone MD [place] AAHIVMS [facility] Hematology/Oncology Physician [place]  (Office):       [phone] (Work cell):  [phone] (Fax):           [phone]

## 2015-07-21 NOTE — ED Notes (Signed)
Bed: [id number] Expected date:  Expected time:  Means of arrival:  Comments: Alert triage when clean, TR 1

## 2015-07-21 NOTE — Progress Notes (Signed)
[facility]   HEMATOLOGY/ONCOLOGY INPATIENT PROGRESS NOTE  Date of Service: 07/21/2015  Inpatient Attending: .Domenic Polite, MD   SUBJECTIVE  Patient presented for follow up labs to monitor her anemia today. She has been noncompliant with her previously plan IV iron infusions and also clinic followup.  Labs today showed a hemoglobin of 4.8.  Patient noted significant fatigue and dyspnea on exertion.  No overt syncopal symptoms.  No chest pain.  Has been using prednisone along with significant NSAIDs use for her rheumatoid arthritis.  Reports no overt GI bleeding.  Notes that her periods have been regular. Patient was accompanied to the emergency room for likely inpatient hospitalization and PRBC transfusions.   OBJECTIVE:  Significant fatigue, very pale-appearing  PHYSICAL EXAMINATION: . Filed Vitals:   07/21/15 1529  BP: 131/88  Pulse: 95  Temp: 98.1 F (36.7 C)  TempSrc: Oral  Resp: 16  SpO2: 100%   There were no vitals filed for this visit. .There is no weight on file to calculate BMI.  GENERAL:alert, in no acute distress and comfortable SKIN: skin color, texture, turgor are normal, no rashes or significant lesions EYES: normal, conjunctiva are pink and non-injected, sclera clear OROPHARYNX:no exudate, no erythema and lips, buccal mucosa, and tongue normal  NECK: supple, no JVD, thyroid normal size, non-tender, without nodularity LYMPH:  no palpable lymphadenopathy in the cervical, axillary or inguinal LUNGS: clear to auscultation with normal respiratory effort HEART: regular rate & rhythm,  no murmurs and no lower extremity edema ABDOMEN: abdomen soft, non-tender, normoactive bowel sounds  Musculoskeletal: no cyanosis of digits and no clubbing  PSYCH: alert & oriented x 3 with fluent speech NEURO: no focal motor/sensory deficits  MEDICAL HISTORY:  Past Medical History  Diagnosis Date  . 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 No pertinent past  surgical history.  SOCIAL HISTORY: Social History   Social History  . Marital Status: Married    Spouse Name: N/A  . Number of Children: N/A  . Years of Education: N/A   Occupational History  . Not on file.   Social History Main Topics  . Smoking status: Current Some Day Smoker    Types: Cigarettes  . Smokeless tobacco: Not on file  . Alcohol Use: No  . Drug Use: No  . Sexual Activity: Yes    Birth Control/ Protection: None   Other Topics Concern  . Not on file   Social History Narrative    FAMILY HISTORY: Family History  Problem Relation Age of Onset  . Cancer Paternal Grandfather     ALLERGIES:  is allergic to leflunomide.  MEDICATIONS:  Scheduled Meds: . [START ON 07/22/2015] ferumoxytol  510 mg Intravenous Once  . furosemide  20 mg Intravenous Once  . furosemide  20 mg Intravenous Once  . [START ON 07/22/2015] pantoprazole  40 mg Oral Q1200  . [START ON 07/22/2015] predniSONE  10 mg Oral Q breakfast   Continuous Infusions: . sodium chloride    . sodium chloride     PRN Meds:.traMADol  REVIEW OF SYSTEMS:    10 Point review of Systems was done is negative except as noted above.   LABORATORY DATA:  I have reviewed the data as listed  . CBC Latest Ref Rng 07/21/2015 02/18/2015 12/17/2014  WBC 3.9 - 10.3 10e3/uL 9.3 11.5(H) 10.9(H)  Hemoglobin 11.6 - 15.9 g/dL 4.8(LL) 9.2(L) 10.7(L)  Hematocrit 34.8 - 46.6 % 17.6(L) 31.2(L) 34.1(L)  Platelets 145 - 400 10e3/uL 533(H) 336 283    .  CMP Latest Ref Rng 07/21/2015 02/18/2015 12/17/2014  Glucose 70 - 140 mg/dl 98 78 105  BUN 7.0 - 26.0 mg/dL 12.7 15.4 15.3  Creatinine 0.6 - 1.1 mg/dL 0.6 0.7 0.7  Sodium 136 - 145 mEq/L 140 141 141  Potassium 3.5 - 5.1 mEq/L 4.0 3.7 4.2  CO2 22 - 29 mEq/L 26 26 24   Calcium 8.4 - 10.4 mg/dL 8.4 8.9 8.8  Total Protein 6.4 - 8.3 g/dL 5.9(L) 6.1(L) 6.6  Total Bilirubin 0.20 - 1.20 mg/dL <0.30 <0.30 <0.20  Alkaline Phos 40 - 150 U/L 67 55 54  AST 5 - 34 U/L 11 14 15   ALT 0  - 55 U/L 12 19 22    . Lab Results  Component Value Date   IRON <11* 07/21/2015   TIBC 390 07/21/2015   IRONPCTSAT Not calculated due to Iron < linear range. 07/21/2015   (Iron and TIBC)  Lab Results  Component Value Date   FERRITIN 5* 07/21/2015      RADIOGRAPHIC STUDIES: I have personally reviewed the radiological images as listed and agreed with the findings in the report. Dg Chest 2 View  07/21/2015  CLINICAL DATA:  Intermittent left sided chest pain for 2 weeks. EXAM: CHEST  2 VIEW COMPARISON:  08/28/2014 FINDINGS: The heart size and mediastinal contours are within normal limits. Both lungs are clear. The visualized skeletal structures are unremarkable. IMPRESSION: Normal exam. Electronically Signed   By: Lorriane Shire M.D.   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 Patient has no history of anemia and was having almost normal MCV previously that rules out the possibility of thalassemia or other hemoglobinopathy. She had overt severe iron deficiency anemia with a ferritin of 2 and iron saturation 1% noted on labs on 08/28/2014. Somewhat better at 14 after 2 months of aggressive oral ferrous sulfate treatment.  No overt bleeding. Celiac panel is negative. Patient feels much better after the IV iron and notes that her energy level are much improved. IN 03/2015 Ferritin levels improved to 29 with iron saturation of 3% but still lower than the target of >100 in the setting of chronic inflammation. It was recommended and IV iron and clinic followup in 2 months.  She chose not to get iron and was noncompliant with her clinic followup. Presented today with severe anemia with a hemoglobin of 4.8 and increased microcytosis and ferritin 5 with significant fatigue and dyspnea on exertion.  #2 B12 deficiency - patient notes that she did not take  it due to headaches. B12 level WNL.on last visit and stable at 671 on labs today.  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 Plan -patient was accompanied to the emergency room by my nurse having labs in our clinic. -Would recommend PRBC transfusion hemoglobin to a hemoglobin level of at least 7. -IV feraheme ordered  For tomorrow after PRBC transfusions -stool occult blood testing in the setting of significant NSAID use --would recommend getting GI consultation for possible EGD  colonoscopy.  --avoid NSAIDs. --need for stress dose steroids-as per hospitalist - discussion to consider switching to alternative pain medication since tramadol can cause some platelet dysfunction.  -RTC with Dr Irene Limbo in 1-2 post discharge for rpt labs and additional dose of IV feraheme  I spent 40 minutes counseling the patient face to face. The total time  spent in the appointment was 40 minutes and more than 50% was on counseling and direct patient cares.    Sullivan Lone MD [place] AAHIVMS [facility] Hematology/Oncology Physician [facility]  (Office):       [phone] (Work cell):  [phone] (Fax):           [phone]  07/21/2015 5:46 PM

## 2015-07-21 NOTE — H&P (Signed)
History and Physical    Kathy Rosales [id number] DOB: 15-Jan-1976 DOA: 07/21/2015  Referring MD/NP/PA: EDP PCP: No PCP Per Patient  Outpatient Specialists: Dr.Kale, Dr. Amil Amen (rheumatology) Patient coming from: home  Chief Complaint: weakness  HPI: Kathy Rosales is a 40 y.o. female with medical history significant of still's disease, severe iron deficiency anemia, was admitted last year for symptomatic anemia this was felt to be multifactorial due to iron deficiency, methotrexate that she was on at the time for still's disease. She stopped taking methotrexate about 1 year ago and was getting iron infusions of the cancer center until December 2 016 subsequently was lost to follow-up and stopped taking iron and B-12 tablets due to side effects namely nausea, headache etc. She's currently on 10 mg of prednisone and tramadol when necessary for still's disease. Patient reports ongoing weakness, dyspnea on exertion, dizziness last month which has been progressively worsening. She also had some chest discomfort yesterday, she went to the [facility] today because she suspected anemia and was sent to the emergency room for further evaluation and management. This found to have hemoglobin of 4.8, MCV is 63, platelets 533, ferritin is 5 iron less than 11  ED Course: Type and screen ordered, for blood transfusion  Review of Systems: As per HPI, also positive for easy bruising that she's noticed in the last couple of weeks otherwise 10 point review of systems negative.   Past Medical History  Diagnosis Date  . Still's disease [facility])     History reviewed. No pertinent past surgical history.   reports that she has been smoking Cigarettes.  She does not have any smokeless tobacco history on file. She reports that she does not drink alcohol or use illicit drugs.  Allergies  Allergen Reactions  . Leflunomide Rash    Family History  Problem Relation Age of Onset  . Cancer Paternal  Grandfather     Prior to Admission medications   Medication Sig Start Date End Date Taking? Authorizing Provider  Aspirin-Salicylamide-Caffeine (BC HEADACHE POWDER PO) Take 1 packet by mouth daily as needed (headache).   Yes Historical Provider, MD  ibuprofen (ADVIL,MOTRIN) 200 MG tablet Take 400 mg by mouth every 6 (six) hours as needed for headache.   Yes Historical Provider, MD  predniSONE (DELTASONE) 10 MG tablet Take 10 mg by mouth 3 (three) times daily.    Yes Historical Provider, MD  traMADol (ULTRAM) 50 MG tablet Take 50 mg by mouth every 6 (six) hours as needed for moderate pain.   Yes Historical Provider, MD  b complex vitamins capsule Take 1 capsule by mouth daily. Patient not taking: Reported on 07/21/2015 02/18/15   Brunetta Genera, MD  Cyanocobalamin 1000 MCG/ML LIQD Place 1,000 mcg under the tongue daily. Patient not taking: Reported on 07/21/2015 10/22/14   Brunetta Genera, MD  ferrous sulfate 325 (65 FE) MG EC tablet Take 1 tablet (325 mg total) by mouth 3 (three) times daily with meals. Patient not taking: Reported on 07/21/2015 10/22/14   Brunetta Genera, MD    Physical Exam: Filed Vitals:   07/21/15 1529  BP: 131/88  Pulse: 95  Temp: 98.1 F (36.7 C)  TempSrc: Oral  Resp: 16  SpO2: 100%      Constitutional: NAD, calm, comfortable, no distress Filed Vitals:   07/21/15 1529  BP: 131/88  Pulse: 95  Temp: 98.1 F (36.7 C)  TempSrc: Oral  Resp: 16  SpO2: 100%   Eyes: PERRL, Severe pallor noted in  the conjunctiva, mild periorbital puffiness noted ENMT: Mucous membranes are moist. Posterior pharynx clear of any exudate or lesions.Normal dentition.  Neck: normal, supple, no masses, no thyromegaly Respiratory: clear to auscultation bilaterally, no wheezing, no crackles. Normal respiratory effort. No accessory muscle use.  Cardiovascular: Regular rate and rhythm, Q000111Q, mild systolic flow murmur noted Abdomen: no tenderness, no masses palpated. No  hepatosplenomegaly. Bowel sounds positive.  Musculoskeletal: no clubbing / cyanosis. No joint deformity upper and lower extremities. Good ROM, no contractures. Normal muscle tone.  Mild swelling of small joints of both hands Skin: Bruises noted on both lower legs and thighs Neurologic: CN 2-12 grossly intact. Sensation intact, DTR normal. Strength 5/5 in all 4.  Psychiatric: Normal judgment and insight. Alert and oriented x 3. Normal mood.   Labs on Admission: I have personally reviewed following labs and imaging studies  CBC:  Recent Labs Lab 07/21/15 1434  WBC 9.3  NEUTROABS 8.2*  HGB 4.8*  HCT 17.6*  MCV 63.7*  PLT Q000111Q*   Basic Metabolic Panel:  Recent Labs Lab 07/21/15 1434  NA 140  K 4.0  CO2 26  GLUCOSE 98  BUN 12.7  CREATININE 0.6  CALCIUM 8.4   GFR: CrCl cannot be calculated (Unknown ideal weight.). Liver Function Tests:  Recent Labs Lab 07/21/15 1434  AST 11  ALT 12  ALKPHOS 67  BILITOT <0.30  PROT 5.9*  ALBUMIN 3.6   No results for input(s): LIPASE, AMYLASE in the last 168 hours. No results for input(s): AMMONIA in the last 168 hours. Coagulation Profile:  Recent Labs Lab 07/21/15 1558  INR 1.06   Cardiac Enzymes: No results for input(s): CKTOTAL, CKMB, CKMBINDEX, TROPONINI in the last 168 hours. BNP (last 3 results) No results for input(s): PROBNP in the last 8760 hours. HbA1C: No results for input(s): HGBA1C in the last 72 hours. CBG: No results for input(s): GLUCAP in the last 168 hours. Lipid Profile: No results for input(s): CHOL, HDL, LDLCALC, TRIG, CHOLHDL, LDLDIRECT in the last 72 hours. Thyroid Function Tests: No results for input(s): TSH, T4TOTAL, FREET4, T3FREE, THYROIDAB in the last 72 hours. Anemia Panel:  Recent Labs  07/21/15 1434  FERRITIN 5*  TIBC 390  IRON <11*  RETICCTPCT 2.80*   Urine analysis: No results found for: COLORURINE, APPEARANCEUR, LABSPEC, PHURINE, GLUCOSEU, HGBUR, BILIRUBINUR, KETONESUR,  PROTEINUR, UROBILINOGEN, NITRITE, LEUKOCYTESUR Sepsis Labs: @LABRCNTIP (procalcitonin:4,lacticidven:4) )No results found for this or any previous visit (from the past 240 hour(s)).   Radiological Exams on Admission: Dg Chest 2 View  07/21/2015  CLINICAL DATA:  Intermittent left sided chest pain for 2 weeks. EXAM: CHEST  2 VIEW COMPARISON:  08/28/2014 FINDINGS: The heart size and mediastinal contours are within normal limits. Both lungs are clear. The visualized skeletal structures are unremarkable. IMPRESSION: Normal exam. Electronically Signed   By: Lorriane Shire M.D.   On: 07/21/2015 16:22    EKG: Independently reviewed. Normal sinus rhythm, no acute ST-T wave changes, baseline wander noted  Assessment/Plan Principal Problem:   Symptomatic anemia -Secondary to severe iron deficiency -Methotrexate was discontinued approximately one year ago -No history suggestive of menorrhagia or overt GI bleeding -Will Hemoccult stools -Celiac panel checked 6 months ago was negative -We will transfuse 3 units of PRBC with 20 mg of IV Lasix between the second and third units -Will need IV iron infusion tomorrow after test dose -Will need hematology follow-up reestablished for iron infusions at the cancer center -also had h/o B12 defi, will repeat levels  Easy bruising -Etiology  unclear -Platelet count is 533K and PT is 13.6, will check PTT -I suspect prednisone could contribute to to this to a small extent   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 Amil Amen -Currently on prednisone 10 mg and tramadol PRN   DVT prophylaxis: SCDs  Code Status: Full COde Family Communication: Friend at bedside Disposition Plan: Home in 1-2days Admission status: Inpatient   Domenic Polite MD [facility] Pager [phone]  If 7PM-7AM, please contact [place] she had a rage [email or web address] Password [facility]  07/21/2015, 5:32 PM

## 2015-07-21 NOTE — ED Provider Notes (Signed)
CSN: [id number]     Arrival date & time 07/21/15  1525 History   First MD Initiated Contact with Patient 07/21/15 1603     Chief Complaint  Patient presents with  . HgB 4.8   . 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 She went to the cancer center today for feelings of generalized fatigue and chest pain which have been worsening over the past week. She was found to have a hemoglobin of 4.8. Patient states that her chest pain has been central and intermittent over the past 7 days, worse with exertion. She has also had shortness of breath with symptoms. Patient has noticed worsening bruising in her lower extremities in the past 3 weeks. She complains of some mild dizziness with exertion. She has had no blood in her stools, heavy menses, fever, or syncope.   She states that she is not currently on iron tablets. She was receiving IV feraheme until December 2016 when she canceled her appointments secondary to "family issues". She has discontinued methotrexate use which is thought to be a possible cause for her anemia; secondary to bone marrow suppression. She was recommended to have an EGD and colonoscopy done which have not yet been performed. She is not followed by a primary care provider.  Oncologist - Dr. Irene Limbo  Patient is a 40 y.o. female presenting with weakness. The history is provided by the patient. No language interpreter was used.  Weakness Associated symptoms include chest pain, fatigue and weakness (generalized). Pertinent negatives include no fever, nausea or vomiting.    Past Medical History  Diagnosis Date  . Still's disease [facility])    History reviewed. No pertinent past surgical history. Family History  Problem Relation Age of Onset  . Cancer Paternal  Grandfather    Social History  Substance Use Topics  . Smoking status: Current Some Day Smoker    Types: Cigarettes  . Smokeless tobacco: None  . Alcohol Use: No   OB History    No data available      Review of Systems  Constitutional: Positive for fatigue. Negative for fever.  Respiratory: Positive for shortness of breath.   Cardiovascular: Positive for chest pain.  Gastrointestinal: Negative for nausea, vomiting and blood in stool.  Neurological: Positive for dizziness and weakness (generalized). Negative for syncope.  All other systems reviewed and are negative.   Allergies  Leflunomide  Home Medications   Prior to Admission medications   Medication Sig Start Date End Date Taking? Authorizing Provider  Aspirin-Salicylamide-Caffeine (BC HEADACHE POWDER PO) Take 1 packet by mouth daily as needed (headache).   Yes Historical Provider, MD  ibuprofen (ADVIL,MOTRIN) 200 MG tablet Take 400 mg by mouth every 6 (six) hours as needed for headache.   Yes Historical Provider, MD  predniSONE (DELTASONE) 10 MG tablet Take 10 mg by mouth 3 (three) times daily.    Yes Historical Provider, MD  traMADol (ULTRAM) 50 MG tablet Take 50 mg by mouth every 6 (six) hours as needed for moderate pain.   Yes Historical Provider, MD  b complex vitamins capsule Take 1 capsule by mouth daily. Patient not taking: Reported on 07/21/2015 02/18/15   Brunetta Genera, MD  Cyanocobalamin 1000 MCG/ML LIQD Place 1,000 mcg under the tongue daily. Patient not taking: Reported on 07/21/2015 10/22/14   Brunetta Genera, MD  ferrous sulfate  325 (65 FE) MG EC tablet Take 1 tablet (325 mg total) by mouth 3 (three) times daily with meals. Patient not taking: Reported on 07/21/2015 10/22/14   Brunetta Genera, MD   BP 131/88 mmHg  Pulse 95  Temp(Src) 98.1 F (36.7 C) (Oral)  Resp 16  SpO2 100%  LMP 07/14/2015   Physical Exam  Constitutional: She is oriented to person, place, and time. She appears  well-developed and well-nourished. No distress.  Nontoxic/nonseptic appearing  HENT:  Head: Normocephalic and atraumatic.  Mouth/Throat: Oropharynx is clear and moist. No oropharyngeal exudate.  Eyes: Conjunctivae and EOM are normal. No scleral icterus.  Neck: Normal range of motion.  Cardiovascular: Regular rhythm and intact distal pulses.   Mild tachycardia  Pulmonary/Chest: Effort normal and breath sounds normal. No respiratory distress. She has no wheezes. She has no rales.  Lungs CTAB. Respirations even and unlabored.  Genitourinary:  No gross blood on DRE. No melena.  Musculoskeletal: Normal range of motion.  Neurological: She is alert and oriented to person, place, and time. She exhibits normal muscle tone. Coordination normal.  GCS 15 Patient moving all extremities. Patient ambulatory with steady gait.  Skin: Skin is warm and dry. No rash noted. She is not diaphoretic. No erythema. No pallor.  Psychiatric: She has a normal mood and affect. Her behavior is normal.  Nursing note and vitals reviewed.   ED Course  Procedures (including critical care time) Labs Review Labs Reviewed  PROTIME-INR  I-STAT TROPOININ, ED  POC OCCULT BLOOD, ED  TYPE AND SCREEN  PREPARE RBC (CROSSMATCH)   Component     Latest Ref Rng 07/21/2015          WBC     3.9 - 10.3 10e3/uL 9.3  NEUT#     1.5 - 6.5 10e3/uL 8.2 (H)  Hemoglobin     11.6 - 15.9 g/dL 4.8 (LL)  HCT     34.8 - 46.6 % 17.6 (L)  Platelets     145 - 400 10e3/uL 533 (H)  MCV     79.5 - 101.0 fL 63.7 (L)  MCH     25.1 - 34.0 pg 17.3 (L)  MCHC     31.5 - 36.0 g/dL 27.2 (L)  RBC     3.70 - 5.45 10e6/uL 2.77 (L)  RDW     11.2 - 14.5 % 19.0 (H)  lymph#     0.9 - 3.3 10e3/uL 0.5 (L)  MONO#     0.1 - 0.9 10e3/uL 0.5  Eosinophils Absolute     0.0 - 0.5 10e3/uL 0.0  Basophils Absolute     0.0 - 0.1 10e3/uL 0.0  NEUT%     38.4 - 76.8 % 88.3 (H)  LYMPH%     14.0 - 49.7 % 5.5 (L)  MONO%     0.0 - 14.0 % 5.5  EOS%      0.0 - 7.0 % 0.4  BASO%     0.0 - 2.0 % 0.3  nRBC     0 - 0 % 1 (H)  Retic %     0.70 - 2.10 % 2.80 (H)  Retic Ct Abs     33.70 - 90.70 10e3/uL 77.56  Immature Retic Fract     1.60 - 10.00 % 17.20 (H)   Component     Latest Ref Rng 07/21/2015  Ferritin     9 - 269 ng/ml 5 (L)   Component     Latest Ref Rng 07/21/2015  Iron     41 - 142 ug/dL <11 (L)  TIBC     236 - 444 ug/dL 390  UIBC     120 - 384 ug/dL Not calculated due to Iron < linear range  %SAT     21 - 57 % Not calculated due to Iron < linear range.    Imaging Review Dg Chest 2 View  07/21/2015  CLINICAL DATA:  Intermittent left sided chest pain for 2 weeks. EXAM: CHEST  2 VIEW COMPARISON:  08/28/2014 FINDINGS: The heart size and mediastinal contours are within normal limits. Both lungs are clear. The visualized skeletal structures are unremarkable. IMPRESSION: Normal exam. Electronically Signed   By: Lorriane Shire M.D.   On: 07/21/2015 16:22   I have personally reviewed and evaluated these images and lab results as part of my medical decision-making.  ED ECG REPORT   Date: 07/21/2015  Rate: 80  Rhythm: normal sinus rhythm  QRS Axis: normal  Intervals: normal  ST/T Wave abnormalities: normal  Conduction Disutrbances:none  Narrative Interpretation: NSR; baseline wander in leads II, III, aVF  Old EKG Reviewed: none available  I have personally reviewed the EKG tracing and agree with the computerized printout as noted.   CRITICAL CARE Performed by: Antonietta Breach   Total critical care time: 35 minutes  Critical care time was exclusive of separately billable procedures and treating other patients.  Critical care was necessary to treat or prevent imminent or life-threatening deterioration.  Critical care was time spent personally by me on the following activities: development of treatment plan with patient and/or surrogate as well as nursing, discussions with consultants, evaluation of patient's response to  treatment, examination of patient, obtaining history from patient or surrogate, ordering and performing treatments and interventions, ordering and review of laboratory studies, ordering and review of radiographic studies, pulse oximetry and re-evaluation of patient's condition.   5:11 PM Per lab, hemoccult NEGATIVE MDM   Final diagnoses:  Symptomatic anemia    40 year old female presents to the emergency department for evaluation of fatigue and generalized weakness with chest pain and shortness of breath on exertion. Patient with a history of severe iron deficiency anemia, thought to be secondary to bone marrow suppression from prolonged methotrexate use in the setting of still's disease. Patient with a hemoglobin of 4.8. She was previously followed by Dr. Irene Limbo from the cancer center. She has no primary care provider. [facility] to admit for symptomatically anemia and transfusion. Initial 2 units of PRBCs ordered.   Filed Vitals:   07/21/15 1529  BP: 131/88  Pulse: 95  Temp: 98.1 F (36.7 C)  TempSrc: Oral  Resp: 16  SpO2: 100%     Antonietta Breach, PA-C 07/21/15 1713  Julianne Rice, MD 07/24/15 1753

## 2015-07-21 NOTE — ED Notes (Addendum)
Pt was supposed to receive extra doses of Feraheme last year at the cancer center and never went for them. Today she went to the cancer center asking for them because she started feeling weak again about a week ago. Physician there ran labs to check her baseline and found her Hgb to be 4.8. Sent her here for "PRBC, PT/INR eval, IV feraheme, and a GI workup."  Pt also c/o chest pain on and off over the last couple weeks in the left side of her chest

## 2015-07-21 NOTE — Progress Notes (Signed)
Providers listed for pt  Kathy Juleen China, MD as Consulting Physician (Hematology and Oncology) Dr. Unice Bailey [rheumatology]-[facility]

## 2015-07-22 DIAGNOSIS — M061 Adult-onset Still's disease: Secondary | ICD-10-CM

## 2015-07-22 LAB — APTT: aPTT: 26 seconds (ref 24–37)

## 2015-07-22 LAB — CBC
HCT: 29.1 % — ABNORMAL LOW (ref 36.0–46.0)
Hemoglobin: 8.4 g/dL — ABNORMAL LOW (ref 12.0–15.0)
MCH: 20.1 pg — ABNORMAL LOW (ref 26.0–34.0)
MCHC: 28.9 g/dL — ABNORMAL LOW (ref 30.0–36.0)
MCV: 69.8 fL — ABNORMAL LOW (ref 78.0–100.0)
Platelets: 543 10*3/uL — ABNORMAL HIGH (ref 150–400)
RBC: 4.17 MIL/uL (ref 3.87–5.11)
RDW: 19 % — ABNORMAL HIGH (ref 11.5–15.5)
WBC: 10.3 10*3/uL (ref 4.0–10.5)

## 2015-07-22 LAB — COMPREHENSIVE METABOLIC PANEL
ALT: 16 U/L (ref 14–54)
AST: 14 U/L — ABNORMAL LOW (ref 15–41)
Albumin: 4.4 g/dL (ref 3.5–5.0)
Alkaline Phosphatase: 72 U/L (ref 38–126)
Anion gap: 12 (ref 5–15)
BUN: 11 mg/dL (ref 6–20)
CO2: 26 mmol/L (ref 22–32)
Calcium: 8.7 mg/dL — ABNORMAL LOW (ref 8.9–10.3)
Chloride: 98 mmol/L — ABNORMAL LOW (ref 101–111)
Creatinine, Ser: 0.55 mg/dL (ref 0.44–1.00)
GFR calc Af Amer: >60 mL/min (ref >60)
GFR calc non Af Amer: >60 mL/min (ref >60)
Glucose, Bld: 102 mg/dL — ABNORMAL HIGH (ref 65–99)
Potassium: 3.6 mmol/L (ref 3.5–5.1)
Sodium: 136 mmol/L (ref 135–145)
Total Bilirubin: 0.5 mg/dL (ref 0.3–1.2)
Total Protein: 6.9 g/dL (ref 6.5–8.1)

## 2015-07-22 LAB — OCCULT BLOOD, POC DEVICE: Fecal Occult Bld: NEGATIVE

## 2015-07-22 LAB — PREGNANCY, URINE: Preg Test, Ur: NEGATIVE

## 2015-07-22 MED ORDER — SODIUM CHLORIDE 0.9 % IV SOLN
INTRAVENOUS | Status: DC
  Administered 2015-07-23: 06:00:00 via INTRAVENOUS

## 2015-07-22 MED ORDER — PEG 3350-KCL-NA BICARB-NACL 420 G PO SOLR
4000.0000 mL | Freq: Once | ORAL | Status: AC
  Administered 2015-07-22: 4000 mL via ORAL

## 2015-07-22 NOTE — Consult Note (Signed)
Referring Provider: Dr. 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  The patient was admitted to the hospital yesterday because of severe weakness, after being seen at the cancer center because of suspected anemia and found to have a hemoglobin of 4.8, as compared to 9.2 last November when it was last checked (last September, it had been 10.7). The patient's iron level was essentially undetectable and an iron saturation was not calculated; last November, she had a 3% iron saturation. Ferritin level last November was 29, but on this admission, it was only 5.  Last year, she was in the hospital and noted to be anemic and was Hemoccult negative. No evaluation was undertaken at that time apart from a negative celiac antibody.  There is no obvious source of blood loss such as menorrhagia or any known or visible GI bleeding.  From the GI tract standpoint, the patient notes early satiety, without progressive weight loss, and a two-week history of alteration of bowel habit, characterized by much less frequent stools (previously a bowel movement once or twice a day, now going several days or even a week between bowel movements).  The patient does report use of aspirin-containing products and ibuprofen.   Past Medical History  Diagnosis Date  . Still's disease [facility])     History reviewed. No pertinent past surgical history.  Prior to Admission medications   Medication Sig Start Date End Date Taking? Authorizing Provider  Aspirin-Salicylamide-Caffeine (BC HEADACHE POWDER PO) Take 1 packet by mouth daily as needed (headache).   Yes Historical Provider, MD  ibuprofen (ADVIL,MOTRIN) 200 MG  tablet Take 400 mg by mouth every 6 (six) hours as needed for headache.   Yes Historical Provider, MD  predniSONE (DELTASONE) 10 MG tablet Take 10 mg by mouth 3 (three) times daily.    Yes Historical Provider, MD  traMADol (ULTRAM) 50 MG tablet Take 50 mg by mouth every 6 (six) hours as needed for moderate pain.   Yes Historical Provider, MD  b complex vitamins capsule Take 1 capsule by mouth daily. Patient not taking: Reported on 07/21/2015 02/18/15   Brunetta Genera, MD  Cyanocobalamin 1000 MCG/ML LIQD Place 1,000 mcg under the tongue daily. Patient not taking: Reported on 07/21/2015 10/22/14   Brunetta Genera, MD  ferrous sulfate 325 (65 FE) MG EC tablet Take 1 tablet (325 mg total) by mouth 3 (three) times daily with meals. Patient not taking: Reported on 07/21/2015 10/22/14   Brunetta Genera, MD    Current Facility-Administered Medications  Medication Dose Route Frequency Provider Last Rate Last Dose  . 0.9 %  sodium chloride infusion   Intravenous Continuous Ronald Lobo, MD      . acetaminophen (TYLENOL) tablet 650 mg  650 mg Oral Q6H PRN Domenic Polite, MD   650 mg at 07/22/15 1101   Or  . acetaminophen (TYLENOL) suppository 650 mg  650 mg Rectal Q6H PRN Domenic Polite, MD      . ondansetron [facility]) tablet 4 mg  4 mg Oral Q6H PRN Domenic Polite, MD       Or  . ondansetron (ZOFRAN) injection 4 mg  4 mg Intravenous Q6H PRN Domenic Polite, MD      .  pantoprazole (PROTONIX) EC tablet 40 mg  40 mg Oral Q1200 Domenic Polite, MD   40 mg at 07/22/15 1532  . predniSONE (DELTASONE) tablet 10 mg  10 mg Oral Q breakfast Domenic Polite, MD   10 mg at 07/22/15 [id number]  . traMADol (ULTRAM) tablet 50 mg  50 mg Oral Q6H PRN Domenic Polite, MD   50 mg at 07/22/15 1001    Allergies as of 07/21/2015 - Review Complete 07/21/2015  Allergen Reaction Noted  . Leflunomide Rash 10/22/2014    Family History  Problem Relation Age of Onset  . Cancer Paternal Grandfather     Social History    Social History  . Marital Status: Married    Spouse Name: N/A  . Number of Children: N/A  . Years of Education: N/A   Occupational History  . Not on file.   Social History Main Topics  . Smoking status: Current Some Day Smoker    Types: Cigarettes  . Smokeless tobacco: Not on file  . Alcohol Use: No  . Drug Use: No  . Sexual Activity: Yes    Birth Control/ Protection: None   Other Topics Concern  . Not on file   Social History Narrative    Review of Systems: Recently, the patient has had some tightness in her chest when walking quickly. She also notes symptoms of early satiety; she did does not have much of an appetite, but shortly after eating just a few mouthfuls of food, it feels as though the food is just "sitting" in the epigastric area and it feels as though her abdomen is distended, even though it isn't. As noted above, there has been a recent change in bowel habits, toward constipation. She also reports depression.  Otherwise, her review of systems is negative. She gets occasional skin rashes attributed to her Still's disease, but denies fevers. No significant arthralgias at this time. No shortness of breath or chronic cough. No enlarged lymph nodes. No urinary symptoms.  Physical Exam: Vital signs in last 24 hours: Temp:  [97.8 F (36.6 C)-98.5 F (36.9 C)] 97.8 F (36.6 C) (04/20 0900) Pulse Rate:  [72-91] 91 (04/20 0900) Resp:  [18-20] 20 (04/20 0900) BP: (111-130)/(62-84) 121/81 mmHg (04/20 0900) SpO2:  [98 %-100 %] 98 % (04/20 0900) Weight:  [63.504 kg (140 lb)] 63.504 kg (140 lb) (04/19 1840) Last BM Date: 07/20/15 (Per patient) General:   Alert,  Well-developed, well-nourished, pleasant and cooperative in NAD. Head:  Moderately cushingoid, atraumatic. Eyes:  Sclera clear, no icterus.   Conjunctiva somewhat pale. Mouth:   No ulcerations or lesions.  Oropharynx pink & moist. Neck:   No masses or thyromegaly. Lungs:  Clear throughout to auscultation.   No  wheezes, crackles, or rhonchi. No evident respiratory distress. Heart:   Regular rate and rhythm; no murmurs, clicks, rubs,  or gallops. Abdomen:  Soft, nontender, nontympanitic, and nondistended. No masses, hepatosplenomegaly or ventral hernias noted. Normal bowel sounds, without bruits, guarding, or rebound.   Rectal:    Not performed, was Hemoccult negative in the emergency room Msk:   Symmetrical without gross deformities. Pulses:  Normal radial pulse is noted. Extremities:   Without clubbing, cyanosis, or edema. Neurologic:  Alert and coherent;  grossly normal neurologically. Skin:  Intact without significant lesions or rashes. Cervical Nodes:  No significant cervical adenopathy. Psych:   Alert and cooperative. Normal mood and affect.  Intake/Output from previous day: 04/19 0701 - 04/20 0700 In: 612 [Blood:612] Out: 1900 [Urine:1900] Intake/Output this shift:  Total I/O In: 280 [Blood:280] Out: -   Lab Results:  Recent Labs  07/21/15 1434 07/22/15 0446  WBC 9.3 10.3  HGB 4.8* 8.4*  HCT 17.6* 29.1*  PLT 533* 543*   BMET  Recent Labs  07/21/15 1434 07/22/15 0446  NA 140 136  K 4.0 3.6  CL  --  98*  CO2 26 26  GLUCOSE 98 102*  BUN 12.7 11  CREATININE 0.6 0.55  CALCIUM 8.4 8.7*   LFT  Recent Labs  07/22/15 0446  PROT 6.9  ALBUMIN 4.4  AST 14*  ALT 16  ALKPHOS 72  BILITOT 0.5   PT/INR  Recent Labs  07/21/15 1558  LABPROT 13.6  INR 1.06    Studies/Results: Dg Chest 2 View  07/21/2015  CLINICAL DATA:  Intermittent left sided chest pain for 2 weeks. EXAM: CHEST  2 VIEW COMPARISON:  08/28/2014 FINDINGS: The heart size and mediastinal contours are within normal limits. Both lungs are clear. The visualized skeletal structures are unremarkable. IMPRESSION: Normal exam. Electronically Signed   By: Lorriane Shire M.D.   On: 07/21/2015 16:22    Impression: 1. Profound iron deficiency anemia with repeatedly Hemoccult negative stool 2. Early satiety  without weight loss 3. Recent change in bowel habits, toward constipation 4. Still's disease, prednisone therapy, depressive symptoms by patient's report 5. Recent exertional chest pain, possibly anemia-related  Plan: Endoscopy and colonoscopy tomorrow. Petra Kuba purpose and risks reviewed and patient agreeable. Further management to deep depend on those findings.   LOS: 1 day   [place] V  07/22/2015, 4:01 PM   Pager [phone] If no answer or after 5 PM call [phone]

## 2015-07-22 NOTE — Progress Notes (Signed)
PROGRESS NOTE    Kathy Rosales  [id number] DOB: November 01, 1975 DOA: 07/21/2015 PCP: No PCP Per Patient   Brief Narrative: 39/F with Stills disease and Severe Iron defi anemia, stop Iron and B12, stopped Iron and B12 48months ago, admitted with severe anemia-iron defi  Assessment & Plan:     Severe Iron deficiency anemia -no clear etiology -lost to FU at [facility], was getting IV Iron till 03/2015 -Methotrexate was discontinued approximately one year ago -No history suggestive of menorrhagia or overt GI bleeding -Hemoccult stools pending, no BM since admit -Celiac panel checked 6 months ago was negative -s/p 3 units of PRBC 4/19 -Will give IV iron today -B12 level ok -Will ask GI to evaluate   Easy bruising -Etiology not  unclear -Platelet count is 533K and PT is 13.6, FU PTT -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 Amil Amen -Currently on prednisone 10 mg and tramadol PRN   DVT prophylaxis:SCDs Code Status:Full Code Family Communication: No Family at bedside Disposition Plan:Home pending GI workup   Consultants:   Heme Dr.Kale   Procedures: PRBC x3   Subjective: Feels much better, swelling gone after lasix  Objective: Filed Vitals:   07/22/15 0505 07/22/15 0556 07/22/15 0622 07/22/15 0900  BP: 117/75 115/77 115/74 121/81  Pulse: 74 79 76 91  Temp: 98.4 F (36.9 C) 98.1 F (36.7 C) 98.2 F (36.8 C) 97.8 F (36.6 C)  TempSrc: Oral Oral Oral Oral  Resp: 20 18 19 20   Height:      Weight:      SpO2: 100% 100% 98% 98%    Intake/Output Summary (Last 24 hours) at 07/22/15 1035 Last data filed at 07/22/15 0900  Gross per 24 hour  Intake    892 ml  Output   1900 ml  Net  -1008 ml   Filed Weights   07/21/15 1840  Weight: 63.504 kg (140 lb)    Examination:  General exam: Appears calm and comfortable, AAox3, no distress Respiratory system: Clear to auscultation. Respiratory effort normal. Cardiovascular system: S1 &  S2 heard, RRR. No JVD, murmurs, rubs, gallops or clicks. No pedal edema. Gastrointestinal system: Abdomen is nondistended, soft and nontender. No organomegaly or masses felt. Normal bowel sounds heard. Central nervous system: Alert and oriented. No focal neurological deficits. Extremities: Symmetric 5 x 5 power. Skin: bruises and ecchymoses noted Psychiatry: Judgement and insight appear normal. Mood & affect appropriate.     Data Reviewed: I have personally reviewed following labs and imaging studies  CBC:  Recent Labs Lab 07/21/15 1434 07/22/15 0446  WBC 9.3 10.3  NEUTROABS 8.2*  --   HGB 4.8* 8.4*  HCT 17.6* 29.1*  MCV 63.7* 69.8*  PLT 533* [id number]*   Basic Metabolic Panel:  Recent Labs Lab 07/21/15 1434 07/22/15 0446  NA 140 136  K 4.0 3.6  CL  --  98*  CO2 26 26  GLUCOSE 98 102*  BUN 12.7 11  CREATININE 0.6 0.55  CALCIUM 8.4 8.7*   GFR: Estimated Creatinine Clearance: 84.7 mL/min (by C-G formula based on Cr of 0.55). Liver Function Tests:  Recent Labs Lab 07/21/15 1434 07/22/15 0446  AST 11 14*  ALT 12 16  ALKPHOS 67 72  BILITOT <0.30 0.5  PROT 5.9* 6.9  ALBUMIN 3.6 4.4   No results for input(s): LIPASE, AMYLASE in the last 168 hours. No results for input(s): AMMONIA in the last 168 hours. Coagulation Profile:  Recent Labs Lab 07/21/15 1558  INR 1.06   Cardiac  Enzymes: No results for input(s): CKTOTAL, CKMB, CKMBINDEX, TROPONINI in the last 168 hours. BNP (last 3 results) No results for input(s): PROBNP in the last 8760 hours. HbA1C: No results for input(s): HGBA1C in the last 72 hours. CBG: No results for input(s): GLUCAP in the last 168 hours. Lipid Profile: No results for input(s): CHOL, HDL, LDLCALC, TRIG, CHOLHDL, LDLDIRECT in the last 72 hours. Thyroid Function Tests: No results for input(s): TSH, T4TOTAL, FREET4, T3FREE, THYROIDAB in the last 72 hours. Anemia Panel:  Recent Labs  07/21/15 1434 07/21/15 1847  VITAMINB12  --  312    FERRITIN 5*  --   TIBC 390  --   IRON <11*  --   RETICCTPCT 2.80*  --    Urine analysis: No results found for: COLORURINE, APPEARANCEUR, LABSPEC, PHURINE, GLUCOSEU, HGBUR, BILIRUBINUR, KETONESUR, PROTEINUR, UROBILINOGEN, NITRITE, LEUKOCYTESUR Sepsis Labs: @LABRCNTIP (procalcitonin:4,lacticidven:4)  )No results found for this or any previous visit (from the past 240 hour(s)).       Radiology Studies: Dg Chest 2 View  07/21/2015  CLINICAL DATA:  Intermittent left sided chest pain for 2 weeks. EXAM: CHEST  2 VIEW COMPARISON:  08/28/2014 FINDINGS: The heart size and mediastinal contours are within normal limits. Both lungs are clear. The visualized skeletal structures are unremarkable. IMPRESSION: Normal exam. Electronically Signed   By: Lorriane Shire M.D.   On: 07/21/2015 16:22        Scheduled Meds: . ferumoxytol  510 mg Intravenous Once  . pantoprazole  40 mg Oral Q1200  . predniSONE  10 mg Oral Q breakfast   Continuous Infusions:    LOS: 1 day    Time spent: 52min    Domenic Polite, MD [facility] Pager [phone]  If 7PM-7AM, please contact night-coverage [email or web address] Password [email or web address] 07/22/2015, 10:35 AM

## 2015-07-23 ENCOUNTER — Telehealth: Payer: Self-pay | Admitting: Hematology

## 2015-07-23 ENCOUNTER — Encounter (HOSPITAL_COMMUNITY): Payer: Self-pay | Admitting: Certified Registered Nurse Anesthetist

## 2015-07-23 ENCOUNTER — Inpatient Hospital Stay (HOSPITAL_COMMUNITY): Payer: Medicaid Other | Admitting: Certified Registered Nurse Anesthetist

## 2015-07-23 ENCOUNTER — Other Ambulatory Visit: Payer: Self-pay | Admitting: Hematology

## 2015-07-23 ENCOUNTER — Encounter (HOSPITAL_COMMUNITY)
Admission: EM | Disposition: A | Discharge status: Home / Self Care | Payer: Self-pay | Source: Home / Self Care | Attending: Internal Medicine

## 2015-07-23 HISTORY — PX: COLONOSCOPY WITH PROPOFOL: SHX5780

## 2015-07-23 HISTORY — PX: ESOPHAGOGASTRODUODENOSCOPY (EGD) WITH PROPOFOL: SHX5813

## 2015-07-23 LAB — FOLATE RBC
Folate, Hemolysate: 354.6 ng/mL
Folate, RBC: 1959 ng/mL (ref >498)
Hematocrit: 18.1 % — ABNORMAL LOW (ref 34.0–46.6)

## 2015-07-23 LAB — TYPE AND SCREEN
ABO/RH(D): O POS
Antibody Screen: NEGATIVE
Unit division: 0
Unit division: 0
Unit division: 0

## 2015-07-23 SURGERY — ESOPHAGOGASTRODUODENOSCOPY (EGD) WITH PROPOFOL
Anesthesia: Monitor Anesthesia Care

## 2015-07-23 MED ORDER — PROPOFOL 500 MG/50ML IV EMUL
INTRAVENOUS | Status: DC | PRN
  Administered 2015-07-23: 100 ug/kg/min via INTRAVENOUS

## 2015-07-23 MED ORDER — FERROUS SULFATE 325 (65 FE) MG PO TBEC: 325.0000 mg | DELAYED_RELEASE_TABLET | Freq: Two times a day (BID) | ORAL | Status: DC

## 2015-07-23 MED ORDER — PROPOFOL 10 MG/ML IV BOLUS
INTRAVENOUS | Status: AC
  Filled 2015-07-23: qty 40

## 2015-07-23 MED ORDER — LIDOCAINE HCL (CARDIAC) 20 MG/ML IV SOLN
INTRAVENOUS | Status: DC | PRN
  Administered 2015-07-23: 50 mg via INTRAVENOUS

## 2015-07-23 MED ORDER — ACETAMINOPHEN 325 MG PO TABS: 650.0000 mg | ORAL_TABLET | Freq: Four times a day (QID) | ORAL | Status: DC | PRN

## 2015-07-23 MED ORDER — B COMPLEX-C PO TABS
1.0000 | ORAL_TABLET | Freq: Every day | ORAL | Status: DC
  Administered 2015-07-23: 1 via ORAL
  Filled 2015-07-23: qty 1

## 2015-07-23 MED ORDER — PROPOFOL 10 MG/ML IV BOLUS
INTRAVENOUS | Status: AC
  Filled 2015-07-23: qty 20

## 2015-07-23 MED ORDER — OMEPRAZOLE 40 MG PO CPDR: 40.0000 mg | DELAYED_RELEASE_CAPSULE | Freq: Every day | ORAL | Status: DC

## 2015-07-23 MED ORDER — LACTATED RINGERS IV SOLN
INTRAVENOUS | Status: DC | PRN
  Administered 2015-07-23: 10:00:00 via INTRAVENOUS

## 2015-07-23 MED ORDER — PROPOFOL 500 MG/50ML IV EMUL
INTRAVENOUS | Status: DC | PRN
  Administered 2015-07-23 (×3): 30 mg via INTRAVENOUS

## 2015-07-23 SURGICAL SUPPLY — 21 items

## 2015-07-23 NOTE — Progress Notes (Signed)
Kathy Rosales   HEMATOLOGY/ONCOLOGY INPATIENT PROGRESS NOTE  Date of Service: 07/22/2015 Inpatient Attending: .Domenic Polite, MD   SUBJECTIVE  Patient is feeling much better after receiving the PRBC transfusions.  She received her IV  feraheme today and tolerated this well. Hemoglobin is up to 8.4 after transfusions.  Has been dilated by GI and is scheduled for EGD and colonoscopy tomorrow.  Fecal occult blood is negative.  OBJECTIVE:  PHYSICAL EXAMINATION: . Filed Vitals:   07/22/15 0556 07/22/15 0622 07/22/15 0900 07/22/15 2247  BP: 115/77 115/74 121/81 116/63  Pulse: 79 76 91 76  Temp: 98.1 F (36.7 C) 98.2 F (36.8 C) 97.8 F (36.6 C) 97.6 F (36.4 C)  TempSrc: Oral Oral Oral Oral  Resp: 18 19 20 19   Height:      Weight:      SpO2: 100% 98% 98% 98%   Filed Weights   07/21/15 1840  Weight: 140 lb (63.504 kg)   .Body mass index is 24.81 kg/(m^2).  GENERAL:alert, in no acute distress and comfortable SKIN: skin color, texture, turgor are normal, no rashes or significant lesions EYES: normal, conjunctiva are pink and non-injected, sclera clear OROPHARYNX:no exudate, no erythema and lips, buccal mucosa, and tongue normal  NECK: supple, no JVD, thyroid normal size, non-tender, without nodularity LYMPH:  no palpable lymphadenopathy in the cervical, axillary or inguinal LUNGS: clear to auscultation with normal respiratory effort HEART: regular rate & rhythm,  no murmurs and no lower extremity edema ABDOMEN: abdomen soft, non-tender, normoactive bowel sounds  Musculoskeletal: no cyanosis of digits and no clubbing  PSYCH: alert & oriented x 3 with fluent speech NEURO: no focal motor/sensory deficits  MEDICAL HISTORY:  Past Medical History  Diagnosis Date  . 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 No pertinent past surgical history.  SOCIAL HISTORY: Social History   Social History  . Marital Status: Married    Spouse Name: N/A  . Number of Children:  N/A  . Years of Education: N/A   Occupational History  . Not on file.   Social History Main Topics  . Smoking status: Current Some Day Smoker    Types: Cigarettes  . Smokeless tobacco: Not on file  . Alcohol Use: No  . Drug Use: No  . Sexual Activity: Yes    Birth Control/ Protection: None   Other Topics Concern  . Not on file   Social History Narrative    FAMILY HISTORY: Family History  Problem Relation Age of Onset  . Cancer Paternal Grandfather     ALLERGIES:  is allergic to leflunomide.  MEDICATIONS:  Scheduled Meds: . B-complex with vitamin C  1 tablet Oral Daily  . pantoprazole  40 mg Oral Q1200  . predniSONE  10 mg Oral Q breakfast   Continuous Infusions: . sodium chloride     PRN Meds:.acetaminophen **OR** acetaminophen, ondansetron **OR** ondansetron (ZOFRAN) IV, traMADol  REVIEW OF SYSTEMS:    10 Point review of Systems was done is negative except as noted above.   LABORATORY DATA:  I have reviewed the data as listed  . CBC Latest Ref Rng 07/22/2015 07/21/2015 02/18/2015  WBC 4.0 - 10.5 K/uL 10.3 9.3 11.5(H)  Hemoglobin 12.0 - 15.0 g/dL 8.4(L) 4.8(LL) 9.2(L)  Hematocrit 36.0 - 46.0 % 29.1(L) 17.6(L) 31.2(L)  Platelets 150 - 400 K/uL 543(H) 533(H) 336    . CMP Latest Ref Rng 07/22/2015 07/21/2015 02/18/2015  Glucose 65 - 99 mg/dL 102(H) 98 78  BUN 6 - 20  mg/dL 11 12.7 15.4  Creatinine 0.44 - 1.00 mg/dL 0.55 0.6 0.7  Sodium 135 - 145 mmol/L 136 140 141  Potassium 3.5 - 5.1 mmol/L 3.6 4.0 3.7  Chloride 101 - 111 mmol/L 98(L) - -  CO2 22 - 32 mmol/L 26 26 26   Calcium 8.9 - 10.3 mg/dL 8.7(L) 8.4 8.9  Total Protein 6.5 - 8.1 g/dL 6.9 5.9(L) 6.1(L)  Total Bilirubin 0.3 - 1.2 mg/dL 0.5 <0.30 <0.30  Alkaline Phos 38 - 126 U/L 72 67 55  AST 15 - 41 U/L 14(L) 11 14  ALT 14 - 54 U/L 16 12 19    . Lab Results  Component Value Date   IRON <11* 07/21/2015   TIBC 390 07/21/2015   IRONPCTSAT Not calculated due to Iron < linear range. 07/21/2015    (Iron and TIBC)  Lab Results  Component Value Date   FERRITIN 5* 07/21/2015      RADIOGRAPHIC STUDIES: I have personally reviewed the radiological images as listed and agreed with the findings in the report. Dg Chest 2 View  07/21/2015  CLINICAL DATA:  Intermittent left sided chest pain for 2 weeks. EXAM: CHEST  2 VIEW COMPARISON:  08/28/2014 FINDINGS: The heart size and mediastinal contours are within normal limits. Both lungs are clear. The visualized skeletal structures are unremarkable. IMPRESSION: Normal exam. Electronically Signed   By: Lorriane Shire M.D.   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 Patient has no history of anemia and was having almost normal MCV previously that rules out the possibility of thalassemia or other hemoglobinopathy. No overt bleeding. Celiac panel is negative. Given NSAID use and chronic prednisone concern for possible GI losses. Hemoglobin improved from 4.8 to 8.4 after transfusion #2 B12 deficiency - patient notes that she did not take it due to headaches. 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 Plan -hemoglobin better after transfusion.  No indication for additional transfusions at this time. -evaluated by GI and scheduled for EGD and colonoscopy tomorrow. -recommended no further NSAID use of this time. -Received 1 dose of IV Feraheme 510mg  today - tolerated this without any concerns.  Would repeat additional 1-2 doses in clinic.  -RTC with Dr Irene Limbo in 1-2 weeks post discharge for rpt labs and additional doses of IV feraheme    Sullivan Lone MD MS AAHIVMS [facility] Hematology/Oncology Physician [facility]  (Office):       [phone] (Work cell):  [phone] (Fax):           [phone]

## 2015-07-23 NOTE — Discharge Summary (Signed)
Physician Discharge Summary  Kathy Rosales [id number] DOB: [birth year] DOA: 07/21/2015  PCP: No PCP Per Patient  Admit date: 07/21/2015 Discharge date: 07/23/2015  Time spent:45 minutes  Recommendations for Outpatient Follow-up:  1. Dr.Kale in 1-2weeks for IV Iron 2. 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 female with medical history significant of still's disease, severe iron deficiency anemia, was admitted last year for symptomatic anemia this was felt to be multifactorial last year. She  was getting iron infusions of the cancer center until December 2 016 subsequently was lost to follow-up and stopped taking iron and B-12 tablets due to side effects namely nausea, headache etc. Patient reported ongoing weakness, dyspnea on exertion, dizziness last month which has been progressively worsening. She also had some chest discomfort 4/18, she went to the [facility] because she suspected anemia and was sent to the emergency room for further evaluation and management. Then found to have hemoglobin of 4.8, MCV is 63, platelets 533, ferritin is 5 iron less than 11  Hospital Course:  Severe Iron deficiency anemia -large gastric ulcer without active bleeding on EGD-due to NSAID ise -was lost to FU at [facility], was getting IV Iron till 03/2015 -Hemoccult stools negative -Celiac panel was negative -s/p 3 units of PRBC 4/19 and given IV Iron 4/20, Hb improved to 8.4 from 4.8 on admission -B12 level ok -[facility] consulted, EGD with large ulcer and colonoscopy  normal -advised stopping NSAIDs, ASA, discharged home on Iron, Omeprazole and with FU with Dr.Kale and Buccini   Easy bruising -Etiology not unclear -Platelet count is 533K and PT is 13.6,  PTT-normal too -suspect Prednisone/tramadol contributing  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 Amil Amen -Currently on prednisone 10 mg and tramadol PRN    Procedures: EGD: Large non-obstructing non-bleeding gastric ulcer   with no stigmata of bleeding. Suspicious for NSAID   induced etiology. There is no evidence of   perforation. Biopsied  Colonoscopy: Normal  Consultations:  GI -Dr.Buccini  Heme Dr.kale  Discharge Exam: Filed Vitals:   07/23/15 1120 07/23/15 1130  BP: 140/89 142/89  Pulse: 71 81  Temp:    Resp: 13 11    General: AAOx3 Cardiovascular:S1S2/RRR Respiratory: CTAB Discharge Instructions   Discharge Instructions    Diet general    Complete by:  As directed      Increase activity slowly    Complete by:  As directed           Current Discharge Medication List    START taking these medications   Details  acetaminophen (TYLENOL) 325 MG tablet Take 2 tablets (650 mg total) by mouth every 6 (six) hours as needed for mild pain or headache (or Fever >/= 101).    omeprazole (PRILOSEC) 40 MG capsule Take 1 capsule (40 mg total) by mouth daily. Qty: 30 capsule, Refills: 1      CONTINUE these medications which have CHANGED   Details  ferrous sulfate 325 (65 FE) MG EC tablet Take 1 tablet (325 mg total) by mouth 2 (two) times daily  with a meal. Start in 1 week Qty: 60 tablet, Refills: 1      CONTINUE these medications which have NOT CHANGED   Details  predniSONE (DELTASONE) 10 MG tablet Take 10 mg by mouth 3 (three) times daily.     traMADol (ULTRAM) 50 MG tablet Take 50 mg by mouth every 6 (six) hours as needed for moderate pain.    b complex vitamins capsule Take 1  capsule by mouth daily. Qty: 60 capsule, Refills: 6      STOP taking these medications     Aspirin-Salicylamide-Caffeine (BC HEADACHE POWDER PO)      ibuprofen (ADVIL,MOTRIN) 200 MG tablet      Cyanocobalamin 1000 MCG/ML LIQD        Allergies  Allergen Reactions  . Leflunomide Rash   Follow-up Information    Follow up with Sullivan Lone, MD In 2 weeks.   Specialties:  Hematology, Oncology   Contact information:   [place] [address] [phone]       Follow up with Cleotis Nipper, MD In 1 month.   Specialty:  Gastroenterology   Why:  Office will call for FU   Contact information:   [address] [place] [address] [phone]        The results of significant diagnostics from this hospitalization (including imaging, microbiology, ancillary and laboratory) are listed below for reference.    Significant Diagnostic Studies: Dg Chest 2 View  07/21/2015  CLINICAL DATA:  Intermittent left sided chest pain for 2 weeks. EXAM: CHEST  2 VIEW COMPARISON:  08/28/2014 FINDINGS: The heart size and mediastinal contours are within normal limits. Both lungs are clear. The visualized skeletal structures are unremarkable. IMPRESSION: Normal exam. Electronically Signed   By: Lorriane Shire M.D.   On: 07/21/2015 16:22    Microbiology: No results found for this or any previous visit (from the past 240 hour(s)).   Labs: Basic Metabolic Panel:  Recent Labs Lab 07/21/15 1434 07/22/15 0446  NA 140 136  K 4.0 3.6  CL  --  98*  CO2 26 26  GLUCOSE 98 102*  BUN 12.7 11  CREATININE 0.6 0.55  CALCIUM 8.4 8.7*   Liver Function Tests:  Recent Labs Lab 07/21/15 1434 07/22/15 0446  AST 11 14*  ALT 12 16  ALKPHOS 67 72  BILITOT <0.30 0.5  PROT 5.9* 6.9  ALBUMIN 3.6 4.4   No results for input(s): LIPASE, AMYLASE in the last 168 hours. No results for input(s): AMMONIA in the last 168 hours. CBC:  Recent Labs Lab 07/21/15 1434  07/22/15 0446  WBC 9.3 10.3  NEUTROABS 8.2*  --   HGB 4.8* 8.4*  HCT 17.6* 29.1*  MCV 63.7* 69.8*  PLT 533* 543*   Cardiac Enzymes: No results for input(s): CKTOTAL, CKMB, CKMBINDEX, TROPONINI in the last 168 hours. BNP: BNP (last 3 results) No results for input(s): BNP in the last 8760 hours.  ProBNP (last 3 results) No results for input(s): PROBNP in the last 8760 hours.  CBG: No results for input(s): GLUCAP in the last 168 hours.     SignedDomenic Polite MD.  [facility] 07/23/2015, 12:25 PM

## 2015-07-23 NOTE — Transfer of Care (Signed)
Immediate Anesthesia Transfer of Care Note  Patient: Kathy Rosales  Procedure(s) Performed: Procedure(s): COLONOSCOPY WITH PROPOFOL (N/A) ESOPHAGOGASTRODUODENOSCOPY (EGD) WITH PROPOFOL (N/A)  Patient Location: PACU  Anesthesia Type:MAC  Level of Consciousness: sedated, patient cooperative and responds to stimulation  Airway & Oxygen Therapy: Patient Spontanous Breathing and Patient connected to nasal cannula oxygen  Post-op Assessment: Report given to RN and Post -op Vital signs reviewed and stable  Post vital signs: Reviewed and stable  Last Vitals:  Filed Vitals:   07/23/15 0626 07/23/15 0728  BP: 129/89 133/91  Pulse: 82 74  Temp: 36.6 C 37.2 C  Resp: 20 16    Complications: No apparent anesthesia complications

## 2015-07-23 NOTE — Op Note (Signed)
[facility] Patient Name: Kathy Rosales Procedure Date: 07/23/2015 MRN: [id number] Attending MD: Ronald Lobo , MD Date of Birth: [birth year] CSN:  Age: 40 Admit Type: Inpatient Procedure:                Colonoscopy Indications:              Iron deficiency anemia--hgb 4.8, ferritin 5, stool                            heme neg, egd today showed large antral ulcer (on                            ASA/NSAID's). Providers:                Ronald Lobo, MD, Laverta Baltimore, RN, Despina Pole, Technician Referring MD:              Medicines:                Propofol per Anesthesia Complications:            No immediate complications. Estimated Blood Loss:     Estimated blood loss: none. Procedure:                Pre-Anesthesia Assessment:                           - Prior to the procedure, a History and Physical                            was performed, and patient medications and                            allergies were reviewed. The patient's tolerance of                            previous anesthesia was also reviewed. The risks                            and benefits of the procedure and the sedation                            options and risks were discussed with the patient.                            All questions were answered, and informed consent                            was obtained. Prior Anticoagulants: The patient has                            taken aspirin, last dose was 2 days prior to                            procedure.  ASA Grade Assessment: II - A patient                            with mild systemic disease. After reviewing the                            risks and benefits, the patient was deemed in                            satisfactory condition to undergo the procedure.                           After obtaining informed consent, the colonoscope                            was passed under direct vision. Throughout the                             procedure, the patient's blood pressure, pulse, and                            oxygen saturations were monitored continuously. The                            [id number] scope was introduced through                            the anus and advanced to the the terminal ileum.                            The colonoscopy was performed without difficulty.                            The patient tolerated the procedure well. The                            quality of the bowel preparation was fair; about                            5-10 percent of the colonic mucosa was obscured.                            The terminal ileum, the ileocecal valve and the                            rectum were photographed. Scope In: 10:33:26 AM Scope Out: 10:52:58 AM Scope Withdrawal Time: 0 hours 14 minutes 12 seconds  Total Procedure Duration: 0 hours 19 minutes 32 seconds  Findings:      The colon (entire examined portion) appeared normal. However, residual       stool obscured various sections of the colon. It is possible that       lesions in those areas are present and would have been missed.      Specifically, a moderate amount  of semi-solid stool was found especially       in the cecum and splenic flexure region, as well as scattered elsewhere       in the colon, interfering with visualization. We turned the patient onto       her back to try to shift the pool of stool debris in the cecum to       visualize addtional sections of the cecum, but this did not seem to help       too much.      The terminal ileum appeared normal.      No polyps, masses, diverticula, vascular ectasiae, or colitis were       observed.      The retroflexed view of the distal rectum and anal verge was normal and       showed no anal or rectal abnormalities. Impression:               - The entire visualized colon is normal, with the                            proviso that about 5% of the colon  surface area was                            obscured by stool debris that could not be                            suctioned or irrigated effectively.                           - Stool in the cecum and scattered elsewhere                            precluding visualization in those areas.                           - The examined portion of the ileum was normal.                           - The distal rectum and anal verge are normal on                            retroflexion view.                           - No specimens collected. Moderate Sedation:      This patient was sedated with monitored anesthesia care, not moderate       sedation. Recommendation:           - Repeat colonoscopy in 10 years for screening                            purposes.                           - In the meantime, consider monitor hemoccult  status and hemoglobin, and consider an earlier                            repeat exam if those are abnormal.                           - Resume previous diet.                           - No aspirin, ibuprofen, naproxen, or other                            non-steroidal anti-inflammatory drugs. Procedure Code(s):        --- Professional ---                           [phone], Colonoscopy, flexible; diagnostic, including                            collection of specimen(s) by brushing or washing,                            when performed (separate procedure) Diagnosis Code(s):        --- Professional ---                           D50.9, Iron deficiency anemia, unspecified CPT copyright 2016 American Medical Association. All rights reserved. The codes documented in this report are preliminary and upon coder review may  be revised to meet current compliance requirements. Ronald Lobo, MD  Ronald Lobo, MD 07/23/2015 11:19:23 AM This report has been signed electronically. Number of Addenda: 0

## 2015-07-23 NOTE — Anesthesia Postprocedure Evaluation (Signed)
Anesthesia Post Note  Patient: Kathy Rosales  Procedure(s) Performed: Procedure(s) (LRB): COLONOSCOPY WITH PROPOFOL (N/A) ESOPHAGOGASTRODUODENOSCOPY (EGD) WITH PROPOFOL (N/A)  Patient location during evaluation: PACU Anesthesia Type: MAC Level of consciousness: awake and alert Pain management: pain level controlled Vital Signs Assessment: post-procedure vital signs reviewed and stable Respiratory status: spontaneous breathing, nonlabored ventilation, respiratory function stable and patient connected to nasal cannula oxygen Cardiovascular status: stable and blood pressure returned to baseline Anesthetic complications: no    Last Vitals:  Filed Vitals:   07/23/15 1120 07/23/15 1130  BP: 140/89 142/89  Pulse: 71 81  Temp:    Resp: 13 11    Last Pain:  Filed Vitals:   07/23/15 1259  PainSc: 3                  Avril Busser J

## 2015-07-23 NOTE — Progress Notes (Signed)
EGD shows very large ulcer.  Colonoscopy grossly nl (suboptimal prep).  Pt advised of need to avoid ASA/NSAID's and since she has no insurance I recommended she use omeprazole OTC 40mg  qAM for the next 2 mos.  My office will contact the pt w/ bx results (for sprue and H pylori) and will arrange treatment for those if needed.  My office will also set up a f/u ov w/ me in 1-2 mos to arrange repeat egd to confirm ulcer healing.  Have ordered diet for pt.    [place] ok today from GI standpoint, since the ulcers were clean-based and did not appear to be at risk of bleeding.  Please call me if questions.  Cleotis Nipper, M.D. Pager [phone] If no answer or after 5 PM call [phone]

## 2015-07-23 NOTE — Anesthesia Preprocedure Evaluation (Addendum)
Anesthesia Evaluation  Patient identified by MRN, date of birth, ID band Patient awake  General Assessment Comment:Still's disease.  Reviewed: Allergy & Precautions, NPO status , Patient's Chart, lab work & pertinent test results  Airway Mallampati: II  TM Distance: >3 FB Neck ROM: Full    Dental no notable dental hx.    Pulmonary Current Smoker,    Pulmonary exam normal breath sounds clear to auscultation       Cardiovascular negative cardio ROS Normal cardiovascular exam Rhythm:Regular Rate:Normal     Neuro/Psych  Headaches, negative psych ROS   GI/Hepatic negative GI ROS, Neg liver ROS,   Endo/Other  negative endocrine ROS  Renal/GU negative Renal ROS  negative genitourinary   Musculoskeletal  (+) Arthritis ,   Abdominal   Peds negative pediatric ROS (+)  Hematology  (+) anemia ,   Anesthesia Other Findings   Reproductive/Obstetrics negative OB ROS                            Anesthesia Physical Anesthesia Plan  ASA: II  Anesthesia Plan: MAC   Post-op Pain Management:    Induction: Intravenous  Airway Management Planned: Natural Airway  Additional Equipment:   Intra-op Plan:   Post-operative Plan:   Informed Consent: I have reviewed the patients History and Physical, chart, labs and discussed the procedure including the risks, benefits and alternatives for the proposed anesthesia with the patient or authorized representative who has indicated his/her understanding and acceptance.   Dental advisory given  Plan Discussed with: CRNA  Anesthesia Plan Comments:         Anesthesia Quick Evaluation

## 2015-07-23 NOTE — Op Note (Signed)
[facility] Patient Name: Kathy Rosales Procedure Date: 07/23/2015 MRN: [id number] Attending MD: Ronald Lobo , MD Date of Birth: [birth year] CSN:  Age: 40 Admit Type: Inpatient Procedure:                Upper GI endoscopy Indications:              Iron deficiency anemia (hgb 4.8, MCV 64, ferritin                            5)--recurrent; heme negative; patient on BC powders                            and ibuprofen; some early satiety. Providers:                Ronald Lobo, MD, Laverta Baltimore, RN, Despina Pole, Technician Referring MD:              Medicines:                Propofol per Anesthesia Complications:            No immediate complications. Estimated Blood Loss:     Estimated blood loss was minimal. Procedure:                Pre-Anesthesia Assessment:                           - Prior to the procedure, a History and Physical                            was performed, and patient medications and                            allergies were reviewed. The patient's tolerance of                            previous anesthesia was also reviewed. The risks                            and benefits of the procedure and the sedation                            options and risks were discussed with the patient.                            All questions were answered, and informed consent                            was obtained. Prior Anticoagulants: The patient has                            taken aspirin, last dose was 2 days prior to  procedure. ASA Grade Assessment: II - A patient                            with mild systemic disease. After reviewing the                            risks and benefits, the patient was deemed in                            satisfactory condition to undergo the procedure.                           After obtaining informed consent, the endoscope was                            passed  under direct vision. Throughout the                            procedure, the patient's blood pressure, pulse, and                            oxygen saturations were monitored continuously. The                            [id number]) scope was introduced through the                            mouth, and advanced to the second part of duodenum.                            The upper GI endoscopy was accomplished without                            difficulty. The patient tolerated the procedure                            well. Scope In: Scope Out: Findings:      The larynx was normal.      The examined esophagus was normal.      There is no endoscopic evidence of esophagitis or hiatal hernia in the       entire esophagus.      One large, deep non-obstructing non-bleeding cratered benign-appearing       gastric ulcer with no stigmata of bleeding was found on the anterior       wall of the lesser curvature of the gastric antrum, very close to the       pylorus. The lesion was 20 mm by 40 mm in largest dimension. There is no       evidence of perforation. Biopsies were taken with a cold forceps for       Helicobacter pylori testing.      One additional non-bleeding shallow cratered gastric ulcer with no       stigmata of bleeding was found on the lesser curvature of the stomach       and at the incisura. The lesion was 9  mm in largest dimension.      The exam of the stomach was otherwise normal.      The cardia and gastric fundus were normal on retroflexion.      The examined duodenum was normal. Biopsies for histology were taken with       a cold forceps for evaluation of celiac disease. Impression:               - Normal larynx.                           - Normal esophagus.                           - Large non-obstructing non-bleeding gastric ulcer                            with no stigmata of bleeding. Suspicious for NSAID                            induced etiology. There is no  evidence of                            perforation. Biopsied.                           - Additional smaller, shallow, non-bleeding gastric                            ulcer with no stigmata of bleeding.                           - Normal examined duodenum. Biopsied. Moderate Sedation:      This patient was sedated with monitored anesthesia care, not moderate       sedation. Recommendation:           - Await pathology results.                           - Repeat upper endoscopy in 2 months to check                            healing.                           - Use Prilosec (omeprazole) 40 mg PO daily for 2                            months.                           - Resume previous diet.                           - No aspirin, ibuprofen, naproxen, or other                            non-steroidal anti-inflammatory drugs. Procedure Code(s):        ---  Professional ---                           [phone], Esophagogastroduodenoscopy, flexible,                            transoral; with biopsy, single or multiple Diagnosis Code(s):        --- Professional ---                           K25.9, Gastric ulcer, unspecified as acute or                            chronic, without hemorrhage or perforation                           D50.9, Iron deficiency anemia, unspecified CPT copyright 2016 American Medical Association. All rights reserved. The codes documented in this report are preliminary and upon coder review may  be revised to meet current compliance requirements. Ronald Lobo, MD  Ronald Lobo, MD 07/23/2015 11:08:42 AM This report has been signed electronically. Number of Addenda: 0

## 2015-07-23 NOTE — Telephone Encounter (Signed)
left msg confirming may appt

## 2015-07-23 NOTE — Progress Notes (Signed)
D/C instructions reviewed w/ pt. Pt verbalizes understanding and all questions answered. Pt d/c in stable condition in w/c by this writer to family's car. Pt in possession of d/c instructions, scripts, and all personal belongings.

## 2015-07-26 ENCOUNTER — Other Ambulatory Visit: Payer: Self-pay | Admitting: *Deleted

## 2015-07-27 ENCOUNTER — Encounter (HOSPITAL_COMMUNITY): Payer: Self-pay | Admitting: Gastroenterology

## 2015-08-05 ENCOUNTER — Other Ambulatory Visit: Payer: Self-pay | Admitting: *Deleted

## 2015-08-06 ENCOUNTER — Ambulatory Visit (HOSPITAL_BASED_OUTPATIENT_CLINIC_OR_DEPARTMENT_OTHER): Payer: Self-pay

## 2015-08-06 ENCOUNTER — Telehealth: Payer: Self-pay | Admitting: Hematology

## 2015-08-06 ENCOUNTER — Encounter: Payer: Self-pay | Admitting: Hematology

## 2015-08-06 ENCOUNTER — Ambulatory Visit (HOSPITAL_BASED_OUTPATIENT_CLINIC_OR_DEPARTMENT_OTHER): Payer: Self-pay | Admitting: Hematology

## 2015-08-06 ENCOUNTER — Other Ambulatory Visit (HOSPITAL_BASED_OUTPATIENT_CLINIC_OR_DEPARTMENT_OTHER): Payer: Self-pay

## 2015-08-06 VITALS — BP 121/82 | Temp 98.2°F | Resp 16

## 2015-08-06 VITALS — BP 124/75 | HR 85 | Temp 98.2°F | Resp 18 | Wt 139.4 lb

## 2015-08-06 DIAGNOSIS — D509 Iron deficiency anemia, unspecified: Secondary | ICD-10-CM

## 2015-08-06 DIAGNOSIS — M061 Adult-onset Still's disease: Secondary | ICD-10-CM

## 2015-08-06 DIAGNOSIS — K259 Gastric ulcer, unspecified as acute or chronic, without hemorrhage or perforation: Secondary | ICD-10-CM | POA: Insufficient documentation

## 2015-08-06 DIAGNOSIS — K2970 Gastritis, unspecified, without bleeding: Secondary | ICD-10-CM

## 2015-08-06 DIAGNOSIS — E538 Deficiency of other specified B group vitamins: Secondary | ICD-10-CM

## 2015-08-06 DIAGNOSIS — B9681 Helicobacter pylori [H. pylori] as the cause of diseases classified elsewhere: Secondary | ICD-10-CM | POA: Insufficient documentation

## 2015-08-06 DIAGNOSIS — K253 Acute gastric ulcer without hemorrhage or perforation: Secondary | ICD-10-CM

## 2015-08-06 DIAGNOSIS — D500 Iron deficiency anemia secondary to blood loss (chronic): Secondary | ICD-10-CM | POA: Insufficient documentation

## 2015-08-06 DIAGNOSIS — D649 Anemia, unspecified: Secondary | ICD-10-CM

## 2015-08-06 LAB — COMPREHENSIVE METABOLIC PANEL
ALT: 17 U/L (ref 0–55)
AST: 11 U/L (ref 5–34)
Albumin: 3.9 g/dL (ref 3.5–5.0)
Alkaline Phosphatase: 58 U/L (ref 40–150)
Anion Gap: 10 mEq/L (ref 3–11)
BUN: 18 mg/dL (ref 7.0–26.0)
CO2: 23 mEq/L (ref 22–29)
Calcium: 8.6 mg/dL (ref 8.4–10.4)
Chloride: 105 mEq/L (ref 98–109)
Creatinine: 0.7 mg/dL (ref 0.6–1.1)
EGFR: >90 mL/min/{1.73_m2} (ref >90)
Glucose: 153 mg/dl — ABNORMAL HIGH (ref 70–140)
Potassium: 3.4 mEq/L — ABNORMAL LOW (ref 3.5–5.1)
Sodium: 137 mEq/L (ref 136–145)
Total Bilirubin: 0.31 mg/dL (ref 0.20–1.20)
Total Protein: 6.1 g/dL — ABNORMAL LOW (ref 6.4–8.3)

## 2015-08-06 LAB — CBC & DIFF AND RETIC
BASO%: 0.1 % (ref 0.0–2.0)
Basophils Absolute: 0 10*3/uL (ref 0.0–0.1)
EOS%: 0.6 % (ref 0.0–7.0)
Eosinophils Absolute: 0.1 10*3/uL (ref 0.0–0.5)
HCT: 36 % (ref 34.8–46.6)
HGB: 11.1 g/dL — ABNORMAL LOW (ref 11.6–15.9)
Immature Retic Fract: 4.5 % (ref 1.60–10.00)
LYMPH%: 12.7 % — ABNORMAL LOW (ref 14.0–49.7)
MCH: 25.7 pg (ref 25.1–34.0)
MCHC: 30.8 g/dL — ABNORMAL LOW (ref 31.5–36.0)
MCV: 83.3 fL (ref 79.5–101.0)
MONO#: 0.3 10*3/uL (ref 0.1–0.9)
MONO%: 3.2 % (ref 0.0–14.0)
NEUT#: 8 10*3/uL — ABNORMAL HIGH (ref 1.5–6.5)
NEUT%: 83.4 % — ABNORMAL HIGH (ref 38.4–76.8)
Platelets: 262 10*3/uL (ref 145–400)
RBC: 4.32 10*6/uL (ref 3.70–5.45)
RDW: 28.8 % — ABNORMAL HIGH (ref 11.2–14.5)
Retic %: 0.87 % (ref 0.70–2.10)
Retic Ct Abs: 37.58 10*3/uL (ref 33.70–90.70)
WBC: 9.6 10*3/uL (ref 3.9–10.3)
lymph#: 1.2 10*3/uL (ref 0.9–3.3)

## 2015-08-06 LAB — TECHNOLOGIST REVIEW

## 2015-08-06 LAB — FERRITIN: Ferritin: 131 ng/ml (ref 9–269)

## 2015-08-06 MED ORDER — CLARITHROMYCIN 500 MG PO TABS: 500.0000 mg | ORAL_TABLET | Freq: Two times a day (BID) | ORAL | Status: DC

## 2015-08-06 MED ORDER — AMOXICILL-CLARITHRO-LANSOPRAZ PO MISC: Freq: Two times a day (BID) | ORAL | Status: DC

## 2015-08-06 MED ORDER — SODIUM CHLORIDE 0.9 % IV SOLN
510.0000 mg | Freq: Once | INTRAVENOUS | Status: AC
  Administered 2015-08-06: 510 mg via INTRAVENOUS
  Filled 2015-08-06: qty 17

## 2015-08-06 MED ORDER — SODIUM CHLORIDE 0.9 % IV SOLN
Freq: Once | INTRAVENOUS | Status: AC
  Administered 2015-08-06: 09:00:00 via INTRAVENOUS

## 2015-08-06 MED ORDER — AMOXICILLIN 500 MG PO CAPS: 1000.0000 mg | ORAL_CAPSULE | Freq: Two times a day (BID) | ORAL | Status: DC

## 2015-08-06 NOTE — Patient Instructions (Signed)

## 2015-08-06 NOTE — Telephone Encounter (Signed)
per pof to sch pt appt-pt to get updated copy b4 leaving °

## 2015-08-07 LAB — VITAMIN B12: Vitamin B12: 392 pg/mL (ref 211–946)

## 2015-08-08 NOTE — Progress Notes (Signed)
HEMATOLOGY CLINIC NOTE  Encounter date: 08/06/2015   Patient Care Team: No Pcp Per Patient as PCP - General (General Practice) Chasta Deshpande Juleen China, MD as Consulting Physician (Hematology and Oncology) Dr. Unice Bailey [rheumatology]-[facility]   CHIEF COMPLAINTS/PURPOSE OF CONSULTATION: Follow-up for management of anemia  DIAGNOSIS: 1. Multifactorial Anemia -  predominantly related to iron deficiency + chronic inflammation + methotrexate + B12 deficiency  2. Helicobacter pylori positive gastric ulcer with GI blood loss requiring transfusion.   Current Treatment:  IV Feraheme as needed  Interval History  Patient is here for follow-up of her anemia after recent hospitalization for severe anemia with a hemoglobin of 4.8. Patient received PRBC transfusion and 1 dose of IV Feraheme. Had an EGD that showed a large gastric ulcer which was Helicobacter pylori positive. Colonoscopy did not show any overt lesions that could cause bleeding. Patient notes that she feels much better after her transfusion and IV iron. No evidence of overt bleeding at this time.  MEDICAL HISTORY:  Past Medical History  Diagnosis Date  . 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 Colonoscopy with propofol N/A 07/23/2015    Procedure: COLONOSCOPY WITH PROPOFOL;  Surgeon: Ronald Lobo, MD;  Location: [facility] ENDOSCOPY;  Service: Endoscopy;  Laterality: N/A;  . Esophagogastroduodenoscopy (egd) with propofol N/A 07/23/2015    Procedure: ESOPHAGOGASTRODUODENOSCOPY (EGD) WITH PROPOFOL;  Surgeon: Ronald Lobo, MD;  Location: [facility] ENDOSCOPY;  Service: Endoscopy;  Laterality: N/A;    SOCIAL HISTORY: Social History   Social History  . Marital Status: Married    Spouse Name: N/A  . Number of Children: N/A  . Years of Education: N/A   Occupational History  . Not on file.   Social History Main Topics  . Smoking status: Never  Smoker   . Smokeless tobacco: Not on file  . Alcohol Use: No  . Drug Use: No  . Sexual Activity: Yes    Birth Control/ Protection: None   Other Topics Concern  . Not on file   Social History Narrative    FAMILY HISTORY: Family History  Problem Relation Age of Onset  . Cancer Paternal Grandfather     ALLERGIES:  is allergic to leflunomide.  MEDICATIONS:  Current Outpatient Prescriptions  Medication Sig Dispense Refill  . acetaminophen (TYLENOL) 325 MG tablet Take 2 tablets (650 mg total) by mouth every 6 (six) hours as needed for mild pain or headache (or Fever >/= 101).    Marland Kitchen amoxicillin (AMOXIL) 500 MG capsule Take 2 capsules (1,000 mg total) by mouth 2 (two) times daily. For 14 days 56 capsule 0  . clarithromycin (BIAXIN) 500 MG tablet Take 1 tablet (500 mg total) by mouth 2 (two) times daily. For 14 days 28 tablet 0  . ferrous sulfate 325 (65 FE) MG EC tablet Take 1 tablet (325 mg total) by mouth 2 (two) times daily with a meal. Start in 1 week 60 tablet 1  . omeprazole (PRILOSEC) 40 MG capsule Take 1 capsule (40 mg total) by mouth daily. 30 capsule 1  . predniSONE (DELTASONE) 10 MG tablet Take 10 mg by mouth 3 (three) times daily.     . traMADol (ULTRAM) 50 MG tablet Take 50 mg by mouth every 6 (six) hours as needed for moderate pain.     No current facility-administered medications for this visit.    REVIEW OF SYSTEMS:   Appropriate positive and negative review of  systems as noted in history of present illness  Rest of the 10 point review of systems done and negative other than as indicated above.  PHYSICAL EXAMINATION: ECOG PERFORMANCE STATUS: 1 - Symptomatic but completely ambulatory  Filed Vitals:   08/06/15 0841  BP: 124/75  Pulse: 85  Temp: 98.2 F (36.8 C)  Resp: 18   Filed Weights   08/06/15 0841  Weight: 139 lb 6.4 oz (63.231 kg)    GENERAL:alert, no distress and comfortable SKIN: skin color, texture, turgor are normal, no rashes or significant  lesions EYES: normal, conjunctiva are pink and non-injected, sclera clear OROPHARYNX:no exudate, no erythema and lips, buccal mucosa, and tongue normal  NECK: supple, thyroid normal size, non-tender, without nodularity LYMPH:  no palpable lymphadenopathy in the cervical, axillary or inguinal LUNGS: clear to auscultation and percussion with normal breathing effort HEART: regular rate & rhythm and no murmurs and no lower extremity edema ABDOMEN:abdomen soft, non-tender and normal bowel sounds Musculoskeletal:no cyanosis of digits and no clubbing  PSYCH: alert & oriented x 3 with fluent speech NEURO: no focal motor/sensory deficits  LABORATORY DATA:  I have reviewed the data as listed  . CBC Latest Ref Rng 08/06/2015 07/22/2015 07/21/2015  WBC 3.9 - 10.3 10e3/uL 9.6 10.3 9.3  Hemoglobin 11.6 - 15.9 g/dL 11.1(L) 8.4(L) 4.8(LL)  Hematocrit 34.8 - 46.6 % 36.0 29.1(L) 18.1(L)  Platelets 145 - 400 10e3/uL 262 543(H) 533(H)     Recent Results (from the past 2160 hour(s))  CBC & Diff and Retic     Status: Abnormal   Collection Time: 07/21/15  2:34 PM  Result Value Ref Range   WBC 9.3 3.9 - 10.3 10e3/uL   NEUT# 8.2 (H) 1.5 - 6.5 10e3/uL   HGB 4.8 (LL) 11.6 - 15.9 g/dL   HCT 17.6 (L) 34.8 - 46.6 %   Platelets 533 (H) 145 - 400 10e3/uL   MCV 63.7 (L) 79.5 - 101.0 fL   MCH 17.3 (L) 25.1 - 34.0 pg   MCHC 27.2 (L) 31.5 - 36.0 g/dL   RBC 2.77 (L) 3.70 - 5.45 10e6/uL   RDW 19.0 (H) 11.2 - 14.5 %   lymph# 0.5 (L) 0.9 - 3.3 10e3/uL   MONO# 0.5 0.1 - 0.9 10e3/uL   Eosinophils Absolute 0.0 0.0 - 0.5 10e3/uL   Basophils Absolute 0.0 0.0 - 0.1 10e3/uL   NEUT% 88.3 (H) 38.4 - 76.8 %   LYMPH% 5.5 (L) 14.0 - 49.7 %   MONO% 5.5 0.0 - 14.0 %   EOS% 0.4 0.0 - 7.0 %   BASO% 0.3 0.0 - 2.0 %   nRBC 1 (H) 0 - 0 %   Retic % 2.80 (H) 0.70 - 2.10 %   Retic Ct Abs 77.56 33.70 - 90.70 10e3/uL   Immature Retic Fract 17.20 (H) 1.60 - 10.00 %  Ferritin     Status: Abnormal   Collection Time: 07/21/15  2:34 PM    Result Value Ref Range   Ferritin 5 (L) 9 - 269 ng/ml  Iron and TIBC     Status: Abnormal   Collection Time: 07/21/15  2:34 PM  Result Value Ref Range   Iron <11 (L) 41 - 142 ug/dL   TIBC 390 236 - 444 ug/dL   UIBC Not calculated due to Iron < linear range 120 - 384 ug/dL   %SAT Not calculated due to Iron < linear range. 21 - 57 %  Comprehensive metabolic panel     Status: Abnormal  Collection Time: 07/21/15  2:34 PM  Result Value Ref Range   Sodium 140 136 - 145 mEq/L   Potassium 4.0 3.5 - 5.1 mEq/L   Chloride 107 98 - 109 mEq/L   CO2 26 22 - 29 mEq/L   Glucose 98 70 - 140 mg/dl    Comment: Glucose reference range is for nonfasting patients. Fasting glucose reference range is 70- 100.   BUN 12.7 7.0 - 26.0 mg/dL   Creatinine 0.6 0.6 - 1.1 mg/dL   Total Bilirubin <0.30 0.20 - 1.20 mg/dL   Alkaline Phosphatase 67 40 - 150 U/L   AST 11 5 - 34 U/L   ALT 12 0 - 55 U/L   Total Protein 5.9 (L) 6.4 - 8.3 g/dL   Albumin 3.6 3.5 - 5.0 g/dL   Calcium 8.4 8.4 - 10.4 mg/dL   Anion Gap 7 3 - 11 mEq/L   EGFR >90 >90 ml/min/1.73 m2    Comment: eGFR is calculated using the CKD-EPI Creatinine Equation (2009)  Type and screen [place]     Status: None   Collection Time: 07/21/15  3:57 PM  Result Value Ref Range   ABO/RH(D) O POS    Antibody Screen NEG    Sample Expiration 07/24/2015    Unit Number [id number]    Blood Component Type RBC LR PHER1    Unit division 00    Status of Unit ISSUED,FINAL    Transfusion Status OK TO TRANSFUSE    Crossmatch Result Compatible    Unit Number [id number]    Blood Component Type RBC LR PHER2    Unit division 00    Status of Unit ISSUED,FINAL    Transfusion Status OK TO TRANSFUSE    Crossmatch Result Compatible    Unit Number [id number]    Blood Component Type RBC LR PHER2    Unit division 00    Status of Unit ISSUED,FINAL    Transfusion Status OK TO TRANSFUSE    Crossmatch Result Compatible   Protime-INR - (order  if Patient is taking Coumadin / Warfarin)     Status: None   Collection Time: 07/21/15  3:58 PM  Result Value Ref Range   Prothrombin Time 13.6 11.6 - 15.2 seconds   INR 1.06 0.00 - 1.49  I-stat troponin, ED     Status: None   Collection Time: 07/21/15  4:24 PM  Result Value Ref Range   Troponin i, poc 0.01 0.00 - 0.08 ng/mL   Comment 3            Comment: Due to the release kinetics of cTnI, a negative result within the first hours of the onset of symptoms does not rule out myocardial infarction with certainty. If myocardial infarction is still suspected, repeat the test at appropriate intervals.   Occult blood, poc device     Status: None   Collection Time: 07/21/15  4:43 PM  Result Value Ref Range   Fecal Occult Bld NEGATIVE NEGATIVE  Prepare RBC     Status: None   Collection Time: 07/21/15  5:02 PM  Result Value Ref Range   Order Confirmation ORDER PROCESSED BY BLOOD BANK   Prepare RBC     Status: None   Collection Time: 07/21/15  6:00 PM  Result Value Ref Range   Order Confirmation ORDER PROCESSED BY BLOOD BANK   Vitamin B12     Status: None   Collection Time: 07/21/15  6:47 PM  Result Value Ref Range  Vitamin B-12 312 180 - 914 pg/mL    Comment: (NOTE) This assay is not validated for testing neonatal or myeloproliferative syndrome specimens for Vitamin B12 levels. Performed at [facility]   Folate RBC     Status: Abnormal   Collection Time: 07/21/15  6:47 PM  Result Value Ref Range   Folate, Hemolysate 354.6 Not Estab. ng/mL   Hematocrit 18.1 (L) 34.0 - 46.6 %   Folate, RBC 1959 >498 ng/mL    Comment: (NOTE) Performed At: [facility] [place], [place] [id number] Lindon Romp MD [id number]   Comprehensive metabolic panel     Status: Abnormal   Collection Time: 07/22/15  4:46 AM  Result Value Ref Range   Sodium 136 135 - 145 mmol/L   Potassium 3.6 3.5 - 5.1 mmol/L   Chloride 98 (L) 101 - 111 mmol/L   CO2 26 22 - 32  mmol/L   Glucose, Bld 102 (H) 65 - 99 mg/dL   BUN 11 6 - 20 mg/dL   Creatinine, Ser 0.55 0.44 - 1.00 mg/dL   Calcium 8.7 (L) 8.9 - 10.3 mg/dL   Total Protein 6.9 6.5 - 8.1 g/dL   Albumin 4.4 3.5 - 5.0 g/dL   AST 14 (L) 15 - 41 U/L   ALT 16 14 - 54 U/L   Alkaline Phosphatase 72 38 - 126 U/L   Total Bilirubin 0.5 0.3 - 1.2 mg/dL   GFR calc non Af Amer >60 >60 mL/min   GFR calc Af Amer >60 >60 mL/min    Comment: (NOTE) The eGFR has been calculated using the CKD EPI equation. This calculation has not been validated in all clinical situations. eGFR's persistently <60 mL/min signify possible Chronic Kidney Disease.    Anion gap 12 5 - 15  CBC     Status: Abnormal   Collection Time: 07/22/15  4:46 AM  Result Value Ref Range   WBC 10.3 4.0 - 10.5 K/uL   RBC 4.17 3.87 - 5.11 MIL/uL   Hemoglobin 8.4 (L) 12.0 - 15.0 g/dL   HCT 29.1 (L) 36.0 - 46.0 %   MCV 69.8 (L) 78.0 - 100.0 fL   MCH 20.1 (L) 26.0 - 34.0 pg   MCHC 28.9 (L) 30.0 - 36.0 g/dL   RDW 19.0 (H) 11.5 - 15.5 %   Platelets 543 (H) 150 - 400 K/uL  APTT     Status: None   Collection Time: 07/22/15 11:23 AM  Result Value Ref Range   aPTT 26 24 - 37 seconds  Pregnancy, urine     Status: None   Collection Time: 07/22/15 12:07 PM  Result Value Ref Range   Preg Test, Ur NEGATIVE NEGATIVE    Comment:        THE SENSITIVITY OF THIS METHODOLOGY IS >20 mIU/mL.   CBC & Diff and Retic     Status: Abnormal   Collection Time: 08/06/15  8:37 AM  Result Value Ref Range   WBC 9.6 3.9 - 10.3 10e3/uL   NEUT# 8.0 (H) 1.5 - 6.5 10e3/uL   HGB 11.1 (L) 11.6 - 15.9 g/dL   HCT 36.0 34.8 - 46.6 %   Platelets 262 145 - 400 10e3/uL   MCV 83.3 79.5 - 101.0 fL   MCH 25.7 25.1 - 34.0 pg   MCHC 30.8 (L) 31.5 - 36.0 g/dL   RBC 4.32 3.70 - 5.45 10e6/uL   RDW 28.8 (H) 11.2 - 14.5 %   lymph# 1.2 0.9 -  3.3 10e3/uL   MONO# 0.3 0.1 - 0.9 10e3/uL   Eosinophils Absolute 0.1 0.0 - 0.5 10e3/uL   Basophils Absolute 0.0 0.0 - 0.1 10e3/uL   NEUT% 83.4  (H) 38.4 - 76.8 %   LYMPH% 12.7 (L) 14.0 - 49.7 %   MONO% 3.2 0.0 - 14.0 %   EOS% 0.6 0.0 - 7.0 %   BASO% 0.1 0.0 - 2.0 %   Retic % 0.87 0.70 - 2.10 %   Retic Ct Abs 37.58 33.70 - 90.70 10e3/uL   Immature Retic Fract 4.50 1.60 - 10.00 %  TECHNOLOGIST REVIEW     Status: None   Collection Time: 08/06/15  8:37 AM  Result Value Ref Range   Technologist Review Few schistocytes, mod ovalos   Vitamin B12     Status: None   Collection Time: 08/06/15  8:37 AM  Result Value Ref Range   Vitamin B12 392 211 - 946 pg/mL  Ferritin     Status: None   Collection Time: 08/06/15  8:37 AM  Result Value Ref Range   Ferritin 131 9 - 269 ng/ml  Comprehensive metabolic panel     Status: Abnormal   Collection Time: 08/06/15  8:38 AM  Result Value Ref Range   Sodium 137 136 - 145 mEq/L   Potassium 3.4 (L) 3.5 - 5.1 mEq/L   Chloride 105 98 - 109 mEq/L   CO2 23 22 - 29 mEq/L   Glucose 153 (H) 70 - 140 mg/dl    Comment: Glucose reference range is for nonfasting patients. Fasting glucose reference range is 70- 100.   BUN 18.0 7.0 - 26.0 mg/dL   Creatinine 0.7 0.6 - 1.1 mg/dL   Total Bilirubin 0.31 0.20 - 1.20 mg/dL   Alkaline Phosphatase 58 40 - 150 U/L   AST 11 5 - 34 U/L   ALT 17 0 - 55 U/L   Total Protein 6.1 (L) 6.4 - 8.3 g/dL   Albumin 3.9 3.5 - 5.0 g/dL   Calcium 8.6 8.4 - 10.4 mg/dL   Anion Gap 10 3 - 11 mEq/L   EGFR >90 >90 ml/min/1.73 m2    Comment: eGFR is calculated using the CKD-EPI Creatinine Equation (2009)   . Lab Results  Component Value Date   IRON <11* 07/21/2015   TIBC 390 07/21/2015   IRONPCTSAT Not calculated due to Iron < linear range. 07/21/2015   (Iron and TIBC)  Lab Results  Component Value Date   FERRITIN 131 08/06/2015   B12: 392    EGD 07/23/2015: Findings: The examined esophagus was normal. There is no endoscopic evidence of esophagitis or hiatal hernia in the entire esophagus. One large, deep non-obstructing non-bleeding cratered benign-appearing gastric  ulcer with no stigmata of bleeding was found on the anterior wall of the lesser curvature of the gastric antrum, very close to the pylorus. The lesion was 20 mm by 40 mm in largest dimension. There is no evidence of perforation. Biopsies were taken with a cold forceps for Helicobacter pylori testing. One additional non-bleeding shallow cratered gastric ulcer with no stigmata of bleeding was found on the lesser curvature of the stomach and at the incisura. The lesion was 9 mm in largest dimension. The exam of the stomach was otherwise normal.  Colonoscopy 07/23/2015: - The entire visualized colon is normal, with the proviso that about 5% of the colon surface area was obscured by stool debris that could not be suctioned or irrigated effectively. - Stool in the cecum and scattered  elsewhere precluding visualization in those areas. - The examined portion of the ileum was normal. - The distal rectum and anal verge are normal on retroflexion view. - No specimens collected.   RADIOGRAPHIC STUDIES: I have personally reviewed the radiological images as listed and agreed with the findings in the report. Dg Chest 2 View  07/21/2015  CLINICAL DATA:  Intermittent left sided chest pain for 2 weeks. EXAM: CHEST  2 VIEW COMPARISON:  08/28/2014 FINDINGS: The heart size and mediastinal contours are within normal limits. Both lungs are clear. The visualized skeletal structures are unremarkable. IMPRESSION: Normal exam. Electronically Signed   By: Lorriane Shire M.D.   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 Patient has no history of anemia and was having almost normal MCV previously that rules out the possibility of thalassemia or other hemoglobinopathy. Celiac panel previously negative. Patient was recently hospitalized with a hemoglobin of 4.8 and received PRBC  transfusion and IV Feraheme. Hemoglobin much improved at 11.2. Patient feeling much better. Ferritin levels improved. EGD showed large gastric ulcer which is Helicobacter pylori positive which was likely cause of GI blood loss. Her ibuprofen use and prednisone also were additional factors causing gastric ulcer.  #2 B12 deficiency - patient notes that she did not take it due to headaches. B12 level WNL.on last visit and stable at 379 on labs today.  #3 Still's disease Plan - Patient is quite grateful that she feels much better. -1 dose of IV Feraheme today as per plan to ensure adequate iron replacement. -Amoxicillin plus clarithromycin plus PPI for treatment of H. pylori-related gastric ulcer. -Patient has also been taken off NSAIDs. -She is to continue following with her rheumatologist to try to get her on prednisone to allow for healing of her gastric ulcers. -Monitor for further GI bleeding.  Return to clinic with Dr. Irene Limbo in 8 weeks with labs CBC, CMP, ferritin  All questions were answered. The patient knows to call the clinic with any problems, questions or concerns. I spent 15 minutes counseling the patient face to face. The total time spent in the appointment was 20 minutes and more than 50% was on counseling.   Sullivan Lone MD [place] AAHIVMS [facility] Hematology/Oncology Physician Ramirez[place]Perez  (Office):       [phone] (Work cell):  [phone] (Fax):           [phone]

## 2015-09-11 ENCOUNTER — Emergency Department (HOSPITAL_COMMUNITY)
Admission: EM | Admit: 2015-09-11 | Discharge: 2015-09-12 | Disposition: A | Discharge status: Home / Self Care | Payer: Self-pay | Attending: Emergency Medicine | Admitting: Emergency Medicine

## 2015-09-11 DIAGNOSIS — X58XXXA Exposure to other specified factors, initial encounter: Secondary | ICD-10-CM | POA: Insufficient documentation

## 2015-09-11 DIAGNOSIS — S22070A Wedge compression fracture of T9-T10 vertebra, initial encounter for closed fracture: Secondary | ICD-10-CM | POA: Insufficient documentation

## 2015-09-11 DIAGNOSIS — Y999 Unspecified external cause status: Secondary | ICD-10-CM | POA: Insufficient documentation

## 2015-09-11 DIAGNOSIS — D649 Anemia, unspecified: Secondary | ICD-10-CM | POA: Insufficient documentation

## 2015-09-11 DIAGNOSIS — Y939 Activity, unspecified: Secondary | ICD-10-CM | POA: Insufficient documentation

## 2015-09-11 DIAGNOSIS — Y929 Unspecified place or not applicable: Secondary | ICD-10-CM | POA: Insufficient documentation

## 2015-09-11 HISTORY — DX: Encounter for other specified aftercare: Z51.89

## 2015-09-11 NOTE — ED Notes (Signed)
Pt from home with complaints of weakness and fatigue. Pt states that this happened about a month ago and pt received 2 units of blood. Pt states that her PCP is unsure why her hemoglobin drops. Pt states she is feeling dizzy as well. Pt has an unremarkable neuro exam and has strong bilateral radial pulses

## 2015-09-12 ENCOUNTER — Encounter (HOSPITAL_COMMUNITY): Payer: Self-pay | Admitting: Emergency Medicine

## 2015-09-12 ENCOUNTER — Emergency Department (HOSPITAL_COMMUNITY): Payer: Self-pay

## 2015-09-12 LAB — URINALYSIS, ROUTINE W REFLEX MICROSCOPIC
Bilirubin Urine: NEGATIVE
Glucose, UA: NEGATIVE mg/dL
Hgb urine dipstick: NEGATIVE
Ketones, ur: NEGATIVE mg/dL
Nitrite: NEGATIVE
Protein, ur: NEGATIVE mg/dL
Specific Gravity, Urine: 1.024 (ref 1.005–1.030)
pH: 8 (ref 5.0–8.0)

## 2015-09-12 LAB — CBC
HCT: 29.8 % — ABNORMAL LOW (ref 36.0–46.0)
Hemoglobin: 9.7 g/dL — ABNORMAL LOW (ref 12.0–15.0)
MCH: 29.8 pg (ref 26.0–34.0)
MCHC: 32.6 g/dL (ref 30.0–36.0)
MCV: 91.7 fL (ref 78.0–100.0)
Platelets: 256 10*3/uL (ref 150–400)
RBC: 3.25 MIL/uL — ABNORMAL LOW (ref 3.87–5.11)
RDW: 26.5 % — ABNORMAL HIGH (ref 11.5–15.5)
WBC: 13 10*3/uL — ABNORMAL HIGH (ref 4.0–10.5)

## 2015-09-12 LAB — BASIC METABOLIC PANEL
Anion gap: 6 (ref 5–15)
BUN: 35 mg/dL — ABNORMAL HIGH (ref 6–20)
CO2: 26 mmol/L (ref 22–32)
Calcium: 8.3 mg/dL — ABNORMAL LOW (ref 8.9–10.3)
Chloride: 102 mmol/L (ref 101–111)
Creatinine, Ser: 0.45 mg/dL (ref 0.44–1.00)
GFR calc Af Amer: >60 mL/min (ref >60)
GFR calc non Af Amer: >60 mL/min (ref >60)
Glucose, Bld: 132 mg/dL — ABNORMAL HIGH (ref 65–99)
Potassium: 4.7 mmol/L (ref 3.5–5.1)
Sodium: 134 mmol/L — ABNORMAL LOW (ref 135–145)

## 2015-09-12 LAB — CBG MONITORING, ED: Glucose-Capillary: 130 mg/dL — ABNORMAL HIGH (ref 65–99)

## 2015-09-12 LAB — PREGNANCY, URINE: Preg Test, Ur: NEGATIVE

## 2015-09-12 LAB — URINE MICROSCOPIC-ADD ON: RBC / HPF: NONE SEEN RBC/hpf (ref 0–5)

## 2015-09-12 NOTE — ED Notes (Signed)
When asked about giving a urine sample, pt. just went to the bathroom.

## 2015-09-12 NOTE — Discharge Instructions (Signed)
Anemia por deficiencia de hierro - Adultos (Iron Deficiency Anemia, Adult)  [name], your xray show a fracture in your spine.  You need to see a primary care doctor within 3 days for close follow up.  This may indicate cancer.  If any symptoms worsen, come back to the ED immediately. Thank you.   [name], su radiografa Kathy Rosales una fractura en la columna vertebral. Usted necesita ver a un mdico de atencin primaria dentro de 3 das para un seguimiento cercano. Esto puede Biomedical engineer. Si los sntomas empeoran, vuelva a la ED inmediatamente. Gracias.   La anemia sucede cuando la cantidad de glbulos rojos sanos es baja. Con frecuencia, la causa es una cantidad insuficiente de hierro. Esto se denomina anemia por deficiencia de hierro y puede provocarle cansancio y dificultad para respirar. [place] vitaminas segn las indicaciones del mdico.  Consuma alimentos que contengan hierro. Estos incluyen hgado, carne magra, pan de grano integral, huevos, frutas deshidratadas y vegetales de hojas color verde oscuro. SOLICITE AYUDA DE INMEDIATO SI:  Pierde el conocimiento (se desmaya).  Siente dolor en el pecho.  Tiene malestar estomacal (nuseas) o vomita.  Tiene mucha dificultad para respirar cuando realiza actividades.  Se siente dbil.  La frecuencia cardaca est acelerada.  Comienza a sudar sin motivo.  Se siente mareado cuando se levanta de una silla o de la cama. ASEGRESE DE QUE:  Comprende estas instrucciones.  Controlar su afeccin.  Recibir ayuda de inmediato si no mejora o si empeora.   Esta informacin no tiene Marine scientist el consejo del mdico. Asegrese de hacerle al mdico cualquier pregunta que tenga.   Document Released: 06/24/2010 Document Revised: 08/04/2014 Elsevier Interactive Patient Education 2016 Kathy Rosales vertebral (Vertebral Fracture) Kathy Rosales vertebral es una quebradura de uno de los huesos que forman la columna vertebral (vrtebras). Las vrtebras estn apiladas una sobre otra para formar la columna vertebral, sirven de soporte del cuerpo y protegen la mdula espinal. La columna vertebral se compone de una porcin superior (columna cervical), una intermedia (columna dorsal) y Kathy Rosales inferior (columna lumbar). [place] fracturas vertebrales se producen en la columna dorsal o la lumbar. Hay tres tipos principales de fracturas vertebrales:  Fractura por flexin. Ocurre cuando hay aplastamiento vertebral. Las vrtebras pueden aplastarse:  En la parte anterior (fractura por aplastamiento). Este tipo de Metallurgist es frecuente en las personas que sufren una enfermedad que les debilita los huesos y los vuelve frgiles (osteoporosis). La fractura puede hacer que una persona pierda estatura.  En la parte anterior y posterior (fractura por compresin axial).  Fractura por traccin. Ocurre cuando una fuerza externa disloca las vrtebras.  Fractura por torsin. Ocurre cuando la columna vertebral se curva mucho en una direccin. Este tipo de fractura puede causar la rotura de una vrtebra (fractura de apfisis transversa) o hacer que se desplace de su posicin normal (fractura-luxacin). Este tipo de Estate manager/land agent un alto riesgo de lesin de la mdula Kathy Rosales fracturas vertebrales pueden ser leves o muy graves. Los tipos ms graves son los que causan el desplazamiento de los huesos fracturados (inestables) y aquellas que lesionan o comprimen la mdula espinal. CAUSAS Por lo general, la causa de esta afeccin es una lesin violenta. Habitualmente, estas son las causas de este tipo de lesin:  Accidentes automovilsticos.  Cadas o saltos desde grandes alturas.  Colisiones en deportes de contacto.  Actos  violentos, como un asalto o una herida de arma de fuego. FACTORES DE RIESGO Es ms probable que esta lesin se produzca  en las personas que:  Tienen osteoporosis.  Practican deportes de contacto.  Estn en situaciones que podran tener como resultado cadas u otras lesiones violentas. SNTOMAS Los sntomas de esta lesin dependen de la [place] y del tipo de Metallurgist. El sntoma ms frecuente es el dolor de espalda que se vuelve ms intenso con el movimiento. Adems, pueden presentarse dificultades para pararse o caminar. Si la fractura da la mdula espinal o la est comprimiendo, tambin puede presentarse lo siguiente:  Entumecimiento.  Hormigueo.  Debilidad.  Prdida de la movilidad.  Prdida del control del intestino o de la vejiga. DIAGNSTICO Esta lesin se puede diagnosticar en funcin de los sntomas, la historia clnica y un examen fsico. Tambin pueden hacerle estudios de diagnstico por imgenes para confirmar el diagnstico. Estos pueden incluir lo siguiente:  Radiografa de la columna.  Tomografa computarizada.  Resonancia magntica. TRATAMIENTO El tratamiento de esta lesin depende del tipo de Metallurgist. Si la fractura est estable y no afecta la mdula espinal, se puede consolidar con un tratamiento no quirrgico, por ejemplo:  La toma de analgsicos.  El uso de un yeso o un dispositivo ortopdico.  Los ejercicios de fisioterapia. Si la fractura vertebral est inestable o afecta la mdula espinal, tal vez necesite tratamiento quirrgico, por ejemplo:  Laminectoma. En este procedimiento, se extirpa la seccin de la vrtebra que est comprimiendo la mdula espinal (ciruga de descompresin medular). Tambin se pueden extirpar fragmentos seos.  Fusin vertebral. Este procedimiento se realiza para estabilizar una fractura que est inestable. Las vrtebras se pueden unir entre s con un fragmento de hueso de otra parte del cuerpo (injerto) y [place] en su lugar con vstagos, placas o tornillos.  Vertebroplastia. En este procedimiento, se utiliza cemento seo para reconstruir  las vrtebras aplastadas. INSTRUCCIONES PARA EL CUIDADO EN EL HOGAR Instrucciones generales  Tome los medicamentos solamente como se lo haya indicado el mdico.  No conduzca ni opere maquinaria pesada mientras toma analgsicos.  Si se lo indican, aplique hielo sobre la zona lesionada:  Ponga el hielo en una bolsa plstica.  Coloque una toalla entre la piel y la bolsa de hielo.  Al principio, deje el hielo en el lugar durante [place] horas. Luego aplquese hielo cuando sea necesario.  Use el soporte para el cuello o la espalda como se lo haya indicado el mdico.  No beba alcohol. El alcohol puede interferir en el tratamiento.  Concurra a todas las visitas de control como se lo haya indicado el mdico. Esto es importante. Puede ayudar a Theatre manager, la discapacidad y Conservation officer, historic buildings prolongado (crnico). Actividad  Haga reposo en cama solo como se lo haya indicado el mdico. El reposo en cama muy prolongado puede empeorar la afeccin.  Reanude sus actividades normales como se lo haya indicado el mdico. Pregunte qu actividades son seguras para usted.  Haga ejercicios para Community education officer y Editor, commissioning espalda (fisioterapia) como se lo haya recomendado el mdico.  Haga ejercicio regularmente como se lo haya indicado el mdico. SOLICITE ATENCIN MDICA SI:  Jaclynn Guarneri.  Tiene tos que hace que el dolor sea ms intenso.  El medicamento no Production designer, theatre/television/film.  El dolor no mejora con el [place].  No puede retomar las actividades normales segn lo planeado o lo esperado. SOLICITE ATENCIN MDICA DE INMEDIATO SI:  El dolor es  muy intenso y empeora repentinamente.  No puede mover alguna parte del cuerpo (parlisis) por debajo del nivel de la lesin.  Tiene entumecimiento, hormigueo o debilidad en alguna parte del cuerpo por debajo del nivel de la lesin.  No puede controlar los intestinos o la vejiga.   Esta informacin no tiene Hydrologist el consejo del mdico. Asegrese de hacerle al mdico cualquier pregunta que tenga.   Document Released: 03/20/2005 Document Revised: 08/04/2014 Elsevier Interactive Patient Education [employer].

## 2015-09-12 NOTE — ED Provider Notes (Signed)
CSN: [id number]     Arrival date & time 09/11/15  2321 History  By signing my name below, I, Irene Pap, attest that this documentation has been prepared under the direction and in the presence of Everlene Balls, MD. Electronically Signed: Irene Pap, ED Scribe. 09/12/2015. 3:31 AM.   Chief Complaint  Patient presents with  . Weakness   The history is provided by the patient. No language interpreter was used.  HPI Comments: Kathy Rosales is a 40 y.o. 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 Pt reports associated dizziness and SOB. She reports hx of similar symptoms one month ago for which she received two units of blood. She states that her current symptoms feel similar to when she had to have the blood. Husband states that she has been evaluated for this and they are trying to pinpoint the cause of her levels dropping. Pt has only had iron given to her via IV. He reports hx of transfusion x3 in the past few years. She denies syncope.   Pt speaks Spanish and is translated by her husband.   Past Medical History  Diagnosis Date  . 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 Blood transfusion without reported diagnosis    Past Surgical History  Procedure Laterality Date  . Colonoscopy with propofol N/A 07/23/2015    Procedure: COLONOSCOPY WITH PROPOFOL;  Surgeon: Ronald Lobo, MD;  Location: [facility]COPY;  Service: Endoscopy;  Laterality: N/A;  . Esophagogastroduodenoscopy (egd) with propofol N/A 07/23/2015    Procedure: ESOPHAGOGASTRODUODENOSCOPY (EGD) WITH PROPOFOL;  Surgeon: Ronald Lobo, MD;  Location: [facility] ENDOSCOPY;  Service: Endoscopy;  Laterality: N/A;   Family History  Problem Relation Age of Onset  . Cancer Paternal Grandfather    Social History  Substance Use Topics  . Smoking status: Never Smoker   . Smokeless tobacco: None  . Alcohol Use: No   OB History    No data available      Review of Systems 10 Systems reviewed and all are negative for acute change except as noted in the HPI.  Allergies  Leflunomide  Home Medications   Prior to Admission medications   Medication Sig Start Date End Date Taking? Authorizing Provider  acetaminophen (TYLENOL) 325 MG tablet Take 2 tablets (650 mg total) by mouth every 6 (six) hours as needed for mild pain or headache (or Fever >/= 101). 07/23/15   Domenic Polite, MD  amoxicillin (AMOXIL) 500 MG capsule Take 2 capsules (1,000 mg total) by mouth 2 (two) times daily. For 14 days 08/06/15   Brunetta Genera, MD  clarithromycin (BIAXIN) 500 MG tablet Take 1 tablet (500 mg total) by mouth 2 (two) times daily. For 14 days 08/06/15   Brunetta Genera, MD  ferrous sulfate 325 (65 FE) MG EC tablet Take 1 tablet (325 mg total) by mouth 2 (two) times daily with a meal. Start in 1 week 07/30/15   Domenic Polite, MD  omeprazole (PRILOSEC) 40 MG capsule Take 1 capsule (40 mg total) by mouth daily. 07/23/15   Domenic Polite, MD  predniSONE (DELTASONE) 10 MG tablet Take 10 mg by mouth 3 (three) times daily.     Historical Provider, MD  traMADol (ULTRAM) 50 MG tablet Take 50 mg by mouth every 6 (six) hours as needed for moderate pain.    Historical Provider, MD   BP 107/72 mmHg  Pulse 83  Temp(Src) 98.4 F (36.9  C) (Oral)  Resp 16  Wt 135 lb (61.236 kg)  SpO2 100% Physical Exam  Constitutional: She is oriented to person, place, and time. She appears well-developed and well-nourished. No distress.  HENT:  Head: Normocephalic and atraumatic.  Nose: Nose normal.  Mouth/Throat: Oropharynx is clear and moist. No oropharyngeal exudate.  Eyes: Conjunctivae and EOM are normal. Pupils are equal, round, and reactive to light. No scleral icterus.  Neck: Normal range of motion. Neck supple. No JVD present. No tracheal deviation present. No thyromegaly present.  Cardiovascular: Normal rate, regular rhythm and normal heart sounds.  Exam reveals no  gallop and no friction rub.   No murmur heard. Pulmonary/Chest: Effort normal and breath sounds normal. No respiratory distress. She has no wheezes. She exhibits no tenderness.  Abdominal: Soft. Bowel sounds are normal. She exhibits no distension and no mass. There is no tenderness. There is no rebound and no guarding.  Musculoskeletal: Normal range of motion. She exhibits no edema or tenderness.  Lymphadenopathy:    She has no cervical adenopathy.  Neurological: She is alert and oriented to person, place, and time. No cranial nerve deficit. She exhibits normal muscle tone.  Skin: Skin is warm and dry. No rash noted. No erythema. No pallor.  Nursing note and vitals reviewed.   ED Course  Procedures (including critical care time) DIAGNOSTIC STUDIES: Oxygen Saturation is 100% on RA, normal by my interpretation.    COORDINATION OF CARE: 3:31 AM-Discussed treatment plan which includes labs with pt at bedside and pt agreed to plan.    Labs Review Labs Reviewed  BASIC METABOLIC PANEL - Abnormal; Notable for the following:    Sodium 134 (*)    Glucose, Bld 132 (*)    BUN 35 (*)    Calcium 8.3 (*)    All other components within normal limits  CBC - Abnormal; Notable for the following:    WBC 13.0 (*)    RBC 3.25 (*)    Hemoglobin 9.7 (*)    HCT 29.8 (*)    RDW 26.5 (*)    All other components within normal limits  URINALYSIS, ROUTINE W REFLEX MICROSCOPIC (NOT AT [facility]) - Abnormal; Notable for the following:    APPearance TURBID (*)    Leukocytes, UA SMALL (*)    All other components within normal limits  URINE MICROSCOPIC-ADD ON - Abnormal; Notable for the following:    Squamous Epithelial / LPF 0-5 (*)    Bacteria, UA RARE (*)    All other components within normal limits  CBG MONITORING, ED - Abnormal; Notable for the following:    Glucose-Capillary 130 (*)    All other components within normal limits  PREGNANCY, URINE  POC OCCULT BLOOD, ED    Imaging Review Dg Chest 2  View  09/12/2015  CLINICAL DATA:  LEFT thoracic pain and shortness of breath for 10 days, weakness and fatigue. Similar symptoms 1 month ago, received a blood transfusion. EXAM: CHEST  2 VIEW COMPARISON:  Chest radiograph July 21, 2015 FINDINGS: Cardiomediastinal silhouette is normal. The lungs are clear without pleural effusions or focal consolidations. Trachea projects midline and there is no pneumothorax. Soft tissue planes are nonsuspicious. Moderate approximate T9 compression fracture with further height loss than prior radiograph. IMPRESSION: No acute cardiopulmonary process. Moderate approximate T9 compression fracture with suspected acute component, recommend correlation with point tenderness. Electronically Signed   By: Elon Alas M.D.   On: 09/12/2015 04:14   I have personally reviewed and evaluated these  images and lab results as part of my medical decision-making.   EKG Interpretation   Date/Time:  Sunday September 12 2015 00:18:13 EDT Ventricular Rate:  95 PR Interval:  116 QRS Duration: 79 QT Interval:  303 QTC Calculation: 381 R Axis:   45 Text Interpretation:  Sinus rhythm Borderline short PR interval Borderline  repolarization abnormality Confirmed by HAVILAND MD, JULIE ([id number]) on  09/12/2015 12:26:57 AM      MDM   Final diagnoses:  None   Patient presents to the ED  For symptoms of anemia. She states she has been short of breath and weak. Hemoglobin is 9.7, white count is elevated, chest x-ray urinalysis did not reveal any infection. X-ray however does reveal T9 compression fracture. This may be a pathologic fracture related to cancer. She may have cancer causing her anemia as well as   I a source for her anemia has not been identified. She was strongly encouraged to follow-up with her primary care physician for evaluation. Steri-Strips good understanding. She appears well and in no acute distress, tachycardia has resolved, her vital signs were within her normal  limits and she is safe for discharge.   personally performed the services described in this documentation, which was scribed in my presence. The recorded information has been reviewed and is accurate.     Everlene Balls, MD 09/12/15 [phone]

## 2015-09-13 LAB — POC OCCULT BLOOD, ED: Fecal Occult Bld: NEGATIVE

## 2015-09-29 ENCOUNTER — Encounter: Payer: Self-pay | Admitting: Family Medicine

## 2015-09-29 ENCOUNTER — Ambulatory Visit (INDEPENDENT_AMBULATORY_CARE_PROVIDER_SITE_OTHER): Payer: Self-pay | Admitting: Family Medicine

## 2015-09-29 VITALS — BP 134/84 | HR 98 | Temp 98.2°F | Resp 12 | Ht 63.0 in | Wt 138.0 lb

## 2015-09-29 DIAGNOSIS — D509 Iron deficiency anemia, unspecified: Secondary | ICD-10-CM

## 2015-09-29 DIAGNOSIS — R739 Hyperglycemia, unspecified: Secondary | ICD-10-CM

## 2015-09-29 DIAGNOSIS — S22000A Wedge compression fracture of unspecified thoracic vertebra, initial encounter for closed fracture: Secondary | ICD-10-CM

## 2015-09-29 DIAGNOSIS — L298 Other pruritus: Secondary | ICD-10-CM

## 2015-09-29 DIAGNOSIS — K259 Gastric ulcer, unspecified as acute or chronic, without hemorrhage or perforation: Secondary | ICD-10-CM

## 2015-09-29 LAB — CBC
HCT: 32.8 % — ABNORMAL LOW (ref 36.0–46.0)
Hemoglobin: 10.3 g/dL — ABNORMAL LOW (ref 12.0–15.0)
MCHC: 31.4 g/dL (ref 30.0–36.0)
MCV: 94 fl (ref 78.0–100.0)
Platelets: 377 10*3/uL (ref 150.0–400.0)
RBC: 3.49 Mil/uL — ABNORMAL LOW (ref 3.87–5.11)
RDW: 21 % — ABNORMAL HIGH (ref 11.5–15.5)
WBC: 9.7 10*3/uL (ref 4.0–10.5)

## 2015-09-29 LAB — HEMOGLOBIN A1C: Hgb A1c MFr Bld: 4.3 % — ABNORMAL LOW (ref 4.6–6.5)

## 2015-09-29 MED ORDER — TRIAMCINOLONE ACETONIDE 0.1 % EX CREA: 1.0000 "application " | TOPICAL_CREAM | Freq: Two times a day (BID) | CUTANEOUS | Status: AC

## 2015-09-29 MED ORDER — HYDROXYZINE HCL 25 MG PO TABS: 25.0000 mg | ORAL_TABLET | Freq: Three times a day (TID) | ORAL | Status: DC | PRN

## 2015-09-29 NOTE — Patient Instructions (Signed)
A few things to remember from today's visit:   1. Thoracic compression fracture, closed, initial encounter ([place])  - MR Thoracic Spine Wo Contrast; Future  2. Iron deficiency anemia  - CBC  3. Hyperglycemia  - Hemoglobin A1c  4. Pruritic erythematous rash  - triamcinolone cream (KENALOG) 0.1 %; Apply 1 application topically 2 (two) times daily.  Dispense: 45 g; Refill: 0 - hydrOXYzine (ATARAX/VISTARIL) 25 MG tablet; Take 1 tablet (25 mg total) by mouth 3 (three) times daily as needed.  Dispense: 30 tablet; Refill: 0   Prednisone puede aumentar el riesgo de fracturas y [employer]. Omeprazole 2 veces at dia.   If you sign-up for My chart, you can communicate easier with [place] in case you have any question or concern.

## 2015-09-29 NOTE — Progress Notes (Signed)
Pre visit review using our clinic review tool, if applicable. No additional management support is needed unless otherwise documented below in the visit note. 

## 2015-09-29 NOTE — Progress Notes (Signed)
HPI:   [name] is a 40 y.o. female, who is here today with her friend to establish care with me.  Former PCP: follows with gyn annually. Last preventive routine visit: 2016.  Concerns today: back pain, X ray was done and  According to pt, she was told it could be ? "cancer" . This results have not been discussed with her  Hematology.  Right mid back pain radiated to hip for 1.5 month, sudden onset.  CXR:  09/12/15 No acute cardiopulmonary process. Moderate approximate T9 compression fracture with suspected acute component, recommend correlation with point tenderness.  She denies any recent injury or unusual level of activity. Intense pain in the morning, stiffness, better after 3 hours of movement.   Pain is constant with periods of exacerbation, it is not radiated to LE or abdomen, sharp like, 10/10 in intensity, with no associated LE numbness, tingling, urinary incontinence or retention, stool incontinence, or saddle anesthesia. Currently she is taking tramadol 50 mg 4 times per day as needed. Exacerbated by prolonged rest and alleviated with movement.  No erythema or edema on area, fever, chills, or abnormal wt loss.  No prior Hx of back pain.  Taking OTC medications: Tylenol and Tramadol 50 mg qid.  She is reporting history of arthritis, Still's disease, she follows with rheumatologist and last visit was about 1-2 months ago. She is on chronic oral Prednisone, she takes 10 mg 3 x day as needed, according to patient she has tried other treatments but has not tolerated well.   Easy bruising for the past few weeks, she denies any known or gum bleeding, no gross hematuria, no blood in the stool. She is reporting black stools, she has a bowel movement every 3-4 days, which is her normal. She denies any constipation or diarrhea. History of iron deficiency anemia, she had 2 U RBC's transfusion about 2 weeks ago, this was her second transfusion. She follows with  hematology, according to patient the etiology of her anemia is unknown. Currently she is not on Fe sulfate.  Denies severe/frequent headache, visual changes, chest pain, dyspnea, palpitation, claudication, focal weakness, or edema. No more fatigue than usual.   Lab Results  Component Value Date   WBC 13.0* 09/12/2015   HGB 9.7* 09/12/2015   HCT 29.8* 09/12/2015   MCV 91.7 09/12/2015   PLT 256 09/12/2015    Bloating sensation for 2 weeks.She has hx of peptic ulcer disease and H. Pylori infection,she has appointment for EDG on 10/21/15,curently on second treatment for H. Pylori.  Reporting last Pap smear in 2016, no abnormal vaginal bleeding or discharge.  + Heartburn, she is on omeprazole 40 mg once daily. She has not been consistent with her diet, still eats spicy food.     Chemistry      Component Value Date/Time   NA 134* 09/12/2015 0114   NA 137 08/06/2015 0838   K 4.7 09/12/2015 0114   K 3.4* 08/06/2015 0838   CL 102 09/12/2015 0114   CO2 26 09/12/2015 0114   CO2 23 08/06/2015 0838   BUN 35* 09/12/2015 0114   BUN 18.0 08/06/2015 0838   CREATININE 0.45 09/12/2015 0114   CREATININE 0.7 08/06/2015 0838      Component Value Date/Time   CALCIUM 8.3* 09/12/2015 0114   CALCIUM 8.6 08/06/2015 0838   ALKPHOS 58 08/06/2015 0838   ALKPHOS 72 07/22/2015 0446   AST 11 08/06/2015 0838   AST 14* 07/22/2015 0446   ALT  17 08/06/2015 0838   ALT 16 07/22/2015 0446   BILITOT 0.31 08/06/2015 0838   BILITOT 0.5 07/22/2015 0446     Elevated glucose on prior labs, no known Hx of DM.   Rash: She is also complaining of 2 weeks of very pruritic rash on lateral aspects of the abdomen and under breast, it has been stable since onset, lesions are not tender. She tells me that she has been "tested" for scabies about 2 months ago and negative.  No sick contact, new medication, new body product or detergent. She has tried over-the-counter Benadryl 25 mg tablets and topical.   Review of  Systems  Constitutional: Negative for fever, activity change, appetite change and unexpected weight change.  HENT: Negative for mouth sores, nosebleeds, sore throat and trouble swallowing.   Respiratory: Negative for cough, shortness of breath and wheezing.        Former smoker.  Cardiovascular: Positive for leg swelling (stable). Negative for chest pain.  Gastrointestinal: Positive for nausea (occasional). Negative for vomiting, abdominal pain and blood in stool.       Negative for changes in bowel habits or fecal incontinence.  Endocrine: Negative for polydipsia, polyphagia and polyuria.  Genitourinary: Negative for dysuria, hematuria, decreased urine volume, vaginal bleeding, vaginal discharge and menstrual problem.       Negative for urine incontinence.  Musculoskeletal: Positive for back pain. Negative for joint swelling, arthralgias and neck pain.  Skin: Positive for rash. Negative for pallor and wound.  Neurological: Negative for syncope, weakness, numbness and headaches.  Hematological: Negative for adenopathy. Bruises/bleeds easily.  Psychiatric/Behavioral: Negative for confusion. The patient is not nervous/anxious.       Current Outpatient Prescriptions on File Prior to Visit  Medication Sig Dispense Refill  . predniSONE (DELTASONE) 10 MG tablet Take 10 mg by mouth 3 (three) times daily.     . traMADol (ULTRAM) 50 MG tablet Take 50 mg by mouth every 6 (six) hours as needed for moderate pain.     No current facility-administered medications on file prior to visit.     Past Medical History  Diagnosis Date  . 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[place])   . Blood transfusion without reported diagnosis    Allergies  Allergen Reactions  . Leflunomide Rash    Family History  Problem Relation Age of Onset  . Cancer Paternal Grandfather     Social History   Social History  . Marital Status: Divorced    Spouse Name: N/A  . Number of Children: N/A  . Years of Education: N/A   Social  History Main Topics  . Smoking status: Never Smoker   . Smokeless tobacco: None  . Alcohol Use: No  . Drug Use: No  . Sexual Activity: Yes    Birth Control/ Protection: None   Other Topics Concern  . None   Social History Narrative    Filed Vitals:   09/29/15 0905  BP: 134/84  Pulse: 98  Temp: 98.2 F (36.8 C)  Resp: 12    Body mass index is 24.45 kg/(m^2).   SpO2 Readings from Last 3 Encounters:  09/29/15 99%  09/12/15 99%  08/06/15 100%      Physical Exam  Nursing note and vitals reviewed. Constitutional: She is oriented to person, place, and time. She appears well-developed and well-nourished. She does not appear ill. No distress.  Cushing like features, moon face.  HENT:  Head: Atraumatic.  Eyes: Conjunctivae and EOM are normal. Pupils are equal, round, and reactive to light.  Neck: No tracheal deviation present.  Cardiovascular: Normal rate and regular rhythm.   No murmur heard. Pulses:      Dorsalis pedis pulses are 2+ on the right side, and 2+ on the left side.  Respiratory: Effort normal and breath sounds normal. No respiratory distress.  GI: Soft. She exhibits no mass. There is no hepatomegaly. There is no tenderness.  Musculoskeletal: She exhibits edema (trace pitting LE edema, bilateral L>R). She exhibits no tenderness.  No significant deformity appreciated. + tenderness upon palpation of paraspinal muscles, T8-11, right.    Lymphadenopathy:    She has no cervical adenopathy.  Neurological: She is alert and oriented to person, place, and time. She has normal strength. Coordination and gait normal.  Reflex Scores:      Patellar reflexes are 2+ on the right side and 2+ on the left side. SLR negative bilateral.  Skin: Skin is warm. No rash noted. No erythema.  Psychiatric: She has a normal mood and affect.  Well groomed, good eye contact.      ASSESSMENT AND PLAN:  Lab Results  Component Value Date   WBC 9.7 09/29/2015   HGB 10.3*  09/29/2015   HCT 32.8* 09/29/2015   MCV 94.0 09/29/2015   PLT 377.0 09/29/2015   Lab Results  Component Value Date   HGBA1C 4.3* 09/29/2015      Kathy Rosales was seen today for new patient (initial visit).  Diagnoses and all orders for this visit:  Thoracic compression fracture, closed, initial encounter ([place])  With no reported Hx of injury. We discussed side effects of Prednisone and this medication could have contributed to Fx. She may need a DEXA to evaluate for osteoporosis. She is very concerns about possible malignance, thoracic MRI will be arrange, not sure if she could qualify for vertebroplasty. Continue Tramadol for pain control.   -     MR Thoracic Spine Wo Contrast; Future  Iron deficiency anemia  With possible anemia of chronic disease. She had last CBC 2 weeks ago, H/H dropped from 11.1 2 (08/06/15)  to 9.7 on 09/12/15. Since she is reporting melena, cbc done today. Further recommendations will be given accordingly. Continue following with hematologists.  -     CBC  Hyperglycemia  High risk for DM, further recommendations will be given according to results.  -     Hemoglobin A1c  Pruritic erythematous rash  For now she will try topical steroids and Hydroxyzine, some side effects discussed. I may consider scabies treatment. F/U as needed.  -     triamcinolone cream (KENALOG) 0.1 %; Apply 1 application topically 2 (two) times daily. -     hydrOXYzine (ATARAX/VISTARIL) 25 MG tablet; Take 1 tablet (25 mg total) by mouth 3 (three) times daily as needed.    Gastric ulcer, unspecified chronicity:  With ? Of upper GI bleed. Increase Omeprazole 40 mg from qd to bid. Some side effects of PPI's discussed. GERD precautions recommended. Keeps appt with GI. Bloating sensation could be related to abx, recommended OTC Probiotic. If persistent we may need to considier pelvic u/s.          Seven Marengo G. Martinique, MD  [facility]. [place]  office.

## 2015-10-05 ENCOUNTER — Encounter (HOSPITAL_COMMUNITY): Payer: Self-pay

## 2015-10-05 ENCOUNTER — Emergency Department (HOSPITAL_COMMUNITY)
Admission: EM | Admit: 2015-10-05 | Discharge: 2015-10-06 | Disposition: A | Discharge status: Home / Self Care | Payer: Medicaid Other | Attending: Emergency Medicine | Admitting: Emergency Medicine

## 2015-10-05 ENCOUNTER — Emergency Department (HOSPITAL_COMMUNITY): Payer: Medicaid Other

## 2015-10-05 DIAGNOSIS — R569 Unspecified convulsions: Secondary | ICD-10-CM | POA: Insufficient documentation

## 2015-10-05 DIAGNOSIS — R251 Tremor, unspecified: Secondary | ICD-10-CM | POA: Diagnosis present

## 2015-10-05 LAB — URINALYSIS, ROUTINE W REFLEX MICROSCOPIC
Bilirubin Urine: NEGATIVE
Glucose, UA: NEGATIVE mg/dL
Hgb urine dipstick: NEGATIVE
Ketones, ur: NEGATIVE mg/dL
Leukocytes, UA: NEGATIVE
Nitrite: NEGATIVE
Protein, ur: NEGATIVE mg/dL
Specific Gravity, Urine: 1.015 (ref 1.005–1.030)
pH: 8 (ref 5.0–8.0)

## 2015-10-05 LAB — COMPREHENSIVE METABOLIC PANEL
ALT: 28 U/L (ref 14–54)
AST: 27 U/L (ref 15–41)
Albumin: 3.8 g/dL (ref 3.5–5.0)
Alkaline Phosphatase: 120 U/L (ref 38–126)
Anion gap: 7 (ref 5–15)
BUN: 12 mg/dL (ref 6–20)
CO2: 27 mmol/L (ref 22–32)
Calcium: 8.7 mg/dL — ABNORMAL LOW (ref 8.9–10.3)
Chloride: 107 mmol/L (ref 101–111)
Creatinine, Ser: 0.53 mg/dL (ref 0.44–1.00)
GFR calc Af Amer: >60 mL/min (ref >60)
GFR calc non Af Amer: >60 mL/min (ref >60)
Glucose, Bld: 86 mg/dL (ref 65–99)
Potassium: 3.9 mmol/L (ref 3.5–5.1)
Sodium: 141 mmol/L (ref 135–145)
Total Bilirubin: 0.6 mg/dL (ref 0.3–1.2)
Total Protein: 6.3 g/dL — ABNORMAL LOW (ref 6.5–8.1)

## 2015-10-05 LAB — CBC WITH DIFFERENTIAL/PLATELET
Basophils Absolute: 0 10*3/uL (ref 0.0–0.1)
Basophils Relative: 0 %
Eosinophils Absolute: 0 10*3/uL (ref 0.0–0.7)
Eosinophils Relative: 1 %
HCT: 32.8 % — ABNORMAL LOW (ref 36.0–46.0)
Hemoglobin: 9.9 g/dL — ABNORMAL LOW (ref 12.0–15.0)
Lymphocytes Relative: 4 %
Lymphs Abs: 0.3 10*3/uL — ABNORMAL LOW (ref 0.7–4.0)
MCH: 28.9 pg (ref 26.0–34.0)
MCHC: 30.2 g/dL (ref 30.0–36.0)
MCV: 95.6 fL (ref 78.0–100.0)
Monocytes Absolute: 0.5 10*3/uL (ref 0.1–1.0)
Monocytes Relative: 7 %
Neutro Abs: 6.4 10*3/uL (ref 1.7–7.7)
Neutrophils Relative %: 88 %
Platelets: 345 10*3/uL (ref 150–400)
RBC: 3.43 MIL/uL — ABNORMAL LOW (ref 3.87–5.11)
RDW: 18.8 % — ABNORMAL HIGH (ref 11.5–15.5)
WBC: 7.2 10*3/uL (ref 4.0–10.5)

## 2015-10-05 LAB — MAGNESIUM: Magnesium: 2.2 mg/dL (ref 1.7–2.4)

## 2015-10-05 LAB — PREGNANCY, URINE: Preg Test, Ur: NEGATIVE

## 2015-10-05 MED ORDER — SODIUM CHLORIDE 0.9 % IV BOLUS (SEPSIS)
1000.0000 mL | Freq: Once | INTRAVENOUS | Status: AC
  Administered 2015-10-05: 1000 mL via INTRAVENOUS

## 2015-10-05 NOTE — ED Notes (Signed)
Pt arrived from home c/o seizures, fell, hit head, A&Ox4.

## 2015-10-05 NOTE — ED Provider Notes (Signed)
CSN: [id number]     Arrival date & time 10/05/15  2132 History   First MD Initiated Contact with Patient 10/05/15 2135     Chief Complaint  Patient presents with  . Seizures     (Consider location/radiation/quality/duration/timing/severity/associated sxs/prior Treatment) HPI Comments: Patient with a history of Stills arthritis presents with report by family of a seizure occurring earlier this evening. Per family the patient was walking in the kitchen and went to the floor with generalized shaking that lasted 2 minutes. No history of seizures. The patient denies memory of event and her first post-seizure memory was being in the ambulance with paramedics. No vomiting, tongue biting or urinary incontinence. She has returned to baseline which family states took about 20 minutes. She states she spent the day outdoors and feels she got overheated. She denies recent illness, fever, anorexia.   Patient is a 40 y.o. female presenting with seizures. The history is provided by the patient. No language interpreter was used.  Seizures Seizure activity on arrival: no   Seizure type:  Grand mal Episode characteristics: generalized shaking and unresponsiveness   Postictal symptoms: confusion   Return to baseline: yes   Duration:  2 minutes   Past Medical History  Diagnosis Date  . 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[place])   . Blood transfusion without reported diagnosis    Past Surgical History  Procedure Laterality Date  . Colonoscopy with propofol N/A 07/23/2015    Procedure: COLONOSCOPY WITH PROPOFOL;  Surgeon: Ronald Lobo, MD;  Location: [facility];  Service: Endoscopy;  Laterality: N/A;  . Esophagogastroduodenoscopy (egd) with propofol N/A 07/23/2015    Procedure: ESOPHAGOGASTRODUODENOSCOPY (EGD) WITH PROPOFOL;  Surgeon: Ronald Lobo, MD;  Location: [facility];  Service: Endoscopy;  Laterality: N/A;   Family History  Problem Relation Age of Onset  . Cancer Paternal Grandfather    Social History   Substance Use Topics  . Smoking status: Never Smoker   . Smokeless tobacco: None  . Alcohol Use: No   OB History    No data available     Review of Systems  Constitutional: Negative for fever and chills.  HENT: Negative.   Eyes: Negative for visual disturbance.  Respiratory: Negative.  Negative for shortness of breath.   Cardiovascular: Negative.   Gastrointestinal: Negative.  Negative for nausea and abdominal pain.  Genitourinary: Negative.   Musculoskeletal: Negative.   Skin: Negative.   Neurological: Positive for seizures.      Allergies  Leflunomide  Home Medications   Prior to Admission medications   Medication Sig Start Date End Date Taking? Authorizing Provider  hydrOXYzine (ATARAX/VISTARIL) 25 MG tablet Take 1 tablet (25 mg total) by mouth 3 (three) times daily as needed. Patient not taking: Reported on 10/05/2015 09/29/15   Betty G Martinique, MD  triamcinolone cream (KENALOG) 0.1 % Apply 1 application topically 2 (two) times daily. Patient not taking: Reported on 10/05/2015 09/29/15 10/09/15  Betty G Martinique, MD   BP 126/96 mmHg  Pulse 119  SpO2 98%  LMP 08/29/2015 Physical Exam  Constitutional: She is oriented to person, place, and time. She appears well-developed and well-nourished.  HENT:  Head: Normocephalic.  Eyes: Pupils are equal, round, and reactive to light.  Neck: Normal range of motion. Neck supple.  Cardiovascular: Normal rate and regular rhythm.   Pulmonary/Chest: Effort normal and breath sounds normal.  Abdominal: Soft. Bowel sounds are normal. There is no tenderness. There is no rebound and no guarding.  Musculoskeletal: Normal range of motion.  Neurological: She is  alert and oriented to person, place, and time. She has normal strength. No sensory deficit. She displays a negative Romberg sign. Coordination normal.  CN's 3-12 grossly intact. No facial asymmetry. Speech clear, focused and goal directed. No deficits of coordination. Ambulatory without  ataxia.   Skin: Skin is warm and dry. No rash noted.  Psychiatric: She has a normal mood and affect.    ED Course  Procedures (including critical care time) Labs Review Labs Reviewed  CBC WITH DIFFERENTIAL/PLATELET - Abnormal; Notable for the following:    RBC 3.43 (*)    Hemoglobin 9.9 (*)    HCT 32.8 (*)    RDW 18.8 (*)    Lymphs Abs 0.3 (*)    All other components within normal limits  COMPREHENSIVE METABOLIC PANEL - Abnormal; Notable for the following:    Calcium 8.7 (*)    Total Protein 6.3 (*)    All other components within normal limits  MAGNESIUM  URINE RAPID DRUG SCREEN, HOSP PERFORMED  URINALYSIS, ROUTINE W REFLEX MICROSCOPIC (NOT AT [facility])  PREGNANCY, URINE   Results for orders placed or performed during the hospital encounter of 10/05/15  CBC with Differential  Result Value Ref Range   WBC 7.2 4.0 - 10.5 K/uL   RBC 3.43 (L) 3.87 - 5.11 MIL/uL   Hemoglobin 9.9 (L) 12.0 - 15.0 g/dL   HCT 32.8 (L) 36.0 - 46.0 %   MCV 95.6 78.0 - 100.0 fL   MCH 28.9 26.0 - 34.0 pg   MCHC 30.2 30.0 - 36.0 g/dL   RDW 18.8 (H) 11.5 - 15.5 %   Platelets 345 150 - 400 K/uL   Neutrophils Relative % 88 %   Neutro Abs 6.4 1.7 - 7.7 K/uL   Lymphocytes Relative 4 %   Lymphs Abs 0.3 (L) 0.7 - 4.0 K/uL   Monocytes Relative 7 %   Monocytes Absolute 0.5 0.1 - 1.0 K/uL   Eosinophils Relative 1 %   Eosinophils Absolute 0.0 0.0 - 0.7 K/uL   Basophils Relative 0 %   Basophils Absolute 0.0 0.0 - 0.1 K/uL  Comprehensive metabolic panel  Result Value Ref Range   Sodium 141 135 - 145 mmol/L   Potassium 3.9 3.5 - 5.1 mmol/L   Chloride 107 101 - 111 mmol/L   CO2 27 22 - 32 mmol/L   Glucose, Bld 86 65 - 99 mg/dL   BUN 12 6 - 20 mg/dL   Creatinine, Ser 0.53 0.44 - 1.00 mg/dL   Calcium 8.7 (L) 8.9 - 10.3 mg/dL   Total Protein 6.3 (L) 6.5 - 8.1 g/dL   Albumin 3.8 3.5 - 5.0 g/dL   AST 27 15 - 41 U/L   ALT 28 14 - 54 U/L   Alkaline Phosphatase 120 38 - 126 U/L   Total Bilirubin 0.6 0.3 - 1.2  mg/dL   GFR calc non Af Amer >60 >60 mL/min   GFR calc Af Amer >60 >60 mL/min   Anion gap 7 5 - 15  Urine rapid drug screen (hosp performed)  Result Value Ref Range   Opiates NONE DETECTED NONE DETECTED   Cocaine NONE DETECTED NONE DETECTED   Benzodiazepines NONE DETECTED NONE DETECTED   Amphetamines NONE DETECTED NONE DETECTED   Tetrahydrocannabinol NONE DETECTED NONE DETECTED   Barbiturates NONE DETECTED NONE DETECTED  Urinalysis, Routine w reflex microscopic  Result Value Ref Range   Color, Urine YELLOW YELLOW   APPearance CLOUDY (A) CLEAR   Specific Gravity, Urine 1.015 1.005 -  1.030   pH 8.0 5.0 - 8.0   Glucose, UA NEGATIVE NEGATIVE mg/dL   Hgb urine dipstick NEGATIVE NEGATIVE   Bilirubin Urine NEGATIVE NEGATIVE   Ketones, ur NEGATIVE NEGATIVE mg/dL   Protein, ur NEGATIVE NEGATIVE mg/dL   Nitrite NEGATIVE NEGATIVE   Leukocytes, UA NEGATIVE NEGATIVE  Pregnancy, urine  Result Value Ref Range   Preg Test, Ur NEGATIVE NEGATIVE  Magnesium  Result Value Ref Range   Magnesium 2.2 1.7 - 2.4 mg/dL   Dg Chest 2 View  09/12/2015  CLINICAL DATA:  LEFT thoracic pain and shortness of breath for 10 days, weakness and fatigue. Similar symptoms 1 month ago, received a blood transfusion. EXAM: CHEST  2 VIEW COMPARISON:  Chest radiograph July 21, 2015 FINDINGS: Cardiomediastinal silhouette is normal. The lungs are clear without pleural effusions or focal consolidations. Trachea projects midline and there is no pneumothorax. Soft tissue planes are nonsuspicious. Moderate approximate T9 compression fracture with further height loss than prior radiograph. IMPRESSION: No acute cardiopulmonary process. Moderate approximate T9 compression fracture with suspected acute component, recommend correlation with point tenderness. Electronically Signed   By: Elon Alas M.D.   On: 09/12/2015 04:14   Ct Head Wo Contrast  10/05/2015  CLINICAL DATA:  Status post seizure and fall. Hit back of head. Initial  encounter. EXAM: CT HEAD WITHOUT CONTRAST TECHNIQUE: Contiguous axial images were obtained from the base of the skull through the vertex without intravenous contrast. COMPARISON:  None. FINDINGS: There is no evidence of acute infarction, mass lesion, or intra- or extra-axial hemorrhage on CT. The posterior fossa, including the cerebellum, brainstem and fourth ventricle, is within normal limits. The third and lateral ventricles, and basal ganglia are unremarkable in appearance. The cerebral hemispheres are symmetric in appearance, with normal gray-white differentiation. No mass effect or midline shift is seen. There is no evidence of fracture; visualized osseous structures are unremarkable in appearance. The visualized portions of the orbits are within normal limits. The paranasal sinuses and mastoid air cells are well-aerated. Mild soft tissue swelling is noted at the occiput. IMPRESSION: 1. No evidence of traumatic intracranial injury or fracture. 2. Mild soft tissue swelling at the occiput. Electronically Signed   By: Garald Balding M.D.   On: 10/05/2015 23:54    Imaging Review No results found. I have personally reviewed and evaluated these images and lab results as part of my medical decision-making.   EKG Interpretation None      MDM   Final diagnoses:  None    1. Seizure  There has been no further seizure activity in the ED. The patient has reassuring lab studies (has a history of anemia - stable today at 9.9) and a negative CT head. She feels better with IVF's and wants to be discharged home.   She is provided a referral to [facility] for further outpatient evaluation and is discharged home with family.     Charlann Lange, PA-C 10/07/15 [id number]  Blanchie Dessert, MD 10/13/15 [phone]

## 2015-10-06 ENCOUNTER — Telehealth: Payer: Self-pay | Admitting: Hematology

## 2015-10-06 ENCOUNTER — Encounter: Payer: Self-pay | Admitting: Hematology

## 2015-10-06 ENCOUNTER — Other Ambulatory Visit: Payer: Self-pay | Admitting: *Deleted

## 2015-10-06 ENCOUNTER — Telehealth: Payer: Self-pay | Admitting: Family Medicine

## 2015-10-06 ENCOUNTER — Ambulatory Visit (HOSPITAL_BASED_OUTPATIENT_CLINIC_OR_DEPARTMENT_OTHER): Payer: Self-pay | Admitting: Hematology

## 2015-10-06 ENCOUNTER — Other Ambulatory Visit (HOSPITAL_BASED_OUTPATIENT_CLINIC_OR_DEPARTMENT_OTHER): Payer: Self-pay

## 2015-10-06 VITALS — BP 128/87 | HR 93 | Temp 98.2°F | Resp 20 | Ht 63.0 in | Wt 134.2 lb

## 2015-10-06 DIAGNOSIS — R569 Unspecified convulsions: Secondary | ICD-10-CM

## 2015-10-06 DIAGNOSIS — D509 Iron deficiency anemia, unspecified: Secondary | ICD-10-CM

## 2015-10-06 DIAGNOSIS — E538 Deficiency of other specified B group vitamins: Secondary | ICD-10-CM

## 2015-10-06 LAB — RAPID URINE DRUG SCREEN, HOSP PERFORMED
Amphetamines: NOT DETECTED
Barbiturates: NOT DETECTED
Benzodiazepines: NOT DETECTED
Cocaine: NOT DETECTED
Opiates: NOT DETECTED
Tetrahydrocannabinol: NOT DETECTED

## 2015-10-06 LAB — FERRITIN: Ferritin: 53 ng/ml (ref 9–269)

## 2015-10-06 MED ORDER — ACETAMINOPHEN 325 MG PO TABS
650.0000 mg | ORAL_TABLET | Freq: Once | ORAL | Status: AC
  Administered 2015-10-06: 650 mg via ORAL
  Filled 2015-10-06: qty 2

## 2015-10-06 NOTE — Discharge Instructions (Signed)

## 2015-10-06 NOTE — Telephone Encounter (Signed)
Gave pt cal & avs... No pof, pt said 3 months

## 2015-10-06 NOTE — Telephone Encounter (Signed)
EEG can be ordered. We could also arranged neuro evaluation if she is interested. Head CT did not show major abnormalities, so if she is not having a unusual headache I think we could hold on brain MRI, it can be considered if EEG shows electric activity consistent with seizure activity.  Thanks, BJ

## 2015-10-06 NOTE — ED Notes (Signed)
Pt stable, ambulatory, states understanding of discharge instructions 

## 2015-10-06 NOTE — Telephone Encounter (Signed)
EEG order has been placed.

## 2015-10-06 NOTE — Telephone Encounter (Signed)
Pt went to the ER due to her having a seizure and they request that she have the EEG or MRI on the Brain added to her already order to see if it was a true seizure.

## 2015-10-07 ENCOUNTER — Telehealth: Payer: Self-pay | Admitting: Family Medicine

## 2015-10-07 MED ORDER — POLYSACCHARIDE IRON COMPLEX 150 MG PO CAPS: 150.0000 mg | ORAL_CAPSULE | Freq: Every day | ORAL | Status: DC

## 2015-10-07 MED ORDER — B COMPLEX VITAMINS PO CAPS: 1.0000 | ORAL_CAPSULE | Freq: Every day | ORAL | Status: DC

## 2015-10-07 MED ORDER — OMEPRAZOLE 20 MG PO CPDR: 20.0000 mg | DELAYED_RELEASE_CAPSULE | Freq: Two times a day (BID) | ORAL | Status: DC

## 2015-10-07 NOTE — Telephone Encounter (Signed)
That should be fine. The hospital she went too wanted [place] to add the EEG onto the order that was already placed. I just chose a [place] site, the hospital should be fine.

## 2015-10-07 NOTE — Progress Notes (Signed)
HEMATOLOGY CLINIC NOTE  Encounter date: .10/06/2015  Patient Care Team: Betty G Martinique, MD as PCP - General (Family Medicine) Brunetta Genera, MD as Consulting Physician (Hematology and Oncology) Dr. Unice Bailey [rheumatology]-[facility]   CHIEF COMPLAINTS/PURPOSE OF CONSULTATION: Follow-up for management of anemia  DIAGNOSIS: 1. Multifactorial Anemia -  predominantly related to iron deficiency + chronic inflammation +  B12 deficiency  2. Helicobacter pylori positive gastric ulcer with GI blood loss requiring transfusion.   Current Treatment:  IV Feraheme as needed  Interval History  Patient is here for her scheduled followup for multifactorial anemia. She notes no overt bleeding. Reports been in the ED for an episode of syncope vs seizure (CT head negative). Also notes some increasing SOB/DOE. She was recommended to call and let her PCP know immediately to direct further evaluation and workup for these 2 new symptoms. Hgb remains in the 9-10 range. She notes that she is continuing to take her PPI for her PUD. On Prednisone per rheumatology. No other acute new symptoms.  MEDICAL HISTORY:  Past Medical History  Diagnosis Date  . 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 Blood transfusion without reported diagnosis    smoker   SURGICAL HISTORY: Past Surgical History  Procedure Laterality Date  . Colonoscopy with propofol N/A 07/23/2015    Procedure: COLONOSCOPY WITH PROPOFOL;  Surgeon: Ronald Lobo, MD;  Location: [facility] ENDOSCOPY;  Service: Endoscopy;  Laterality: N/A;  . Esophagogastroduodenoscopy (egd) with propofol N/A 07/23/2015    Procedure: ESOPHAGOGASTRODUODENOSCOPY (EGD) WITH PROPOFOL;  Surgeon: Ronald Lobo, MD;  Location: [facility]OSCOPY;  Service: Endoscopy;  Laterality: N/A;    SOCIAL HISTORY: Social History   Social History  . Marital Status: Legally Separated    Spouse Name: N/A  . Number of Children: N/A  . Years of Education: N/A    Occupational History  . Not on file.   Social History Main Topics  . Smoking status: Never Smoker   . Smokeless tobacco: Not on file  . Alcohol Use: No  . Drug Use: No  . Sexual Activity: Yes    Birth Control/ Protection: None   Other Topics Concern  . Not on file   Social History Narrative    FAMILY HISTORY: Family History  Problem Relation Age of Onset  . Cancer Paternal Grandfather     ALLERGIES:  is allergic to leflunomide.  MEDICATIONS:  Current Outpatient Prescriptions  Medication Sig Dispense Refill  . hydrOXYzine (ATARAX/VISTARIL) 25 MG tablet Take 1 tablet (25 mg total) by mouth 3 (three) times daily as needed. 30 tablet 0  . triamcinolone cream (KENALOG) 0.1 % Apply 1 application topically 2 (two) times daily. 45 g 0  . b complex vitamins capsule Take 1 capsule by mouth daily. 60 capsule 3  . iron polysaccharides (NIFEREX) 150 MG capsule Take 1 capsule (150 mg total) by mouth daily. 30 capsule 3  . omeprazole (PRILOSEC) 20 MG capsule Take 1 capsule (20 mg total) by mouth 2 (two) times daily before a meal.     No current facility-administered medications for this visit.    REVIEW OF SYSTEMS:   Appropriate positive and negative review of systems as noted in history of present illness  Rest of the 10 point review of systems done and negative other than as indicated above.  PHYSICAL EXAMINATION: ECOG PERFORMANCE STATUS: 1 - Symptomatic but completely ambulatory  Filed Vitals:   10/06/15 0900  BP: 128/87  Pulse: 93  Temp: 98.2 F (36.8 C)  Resp: 20   Filed Weights   10/06/15 0900  Weight: 134 lb 3.2 oz (60.873 kg)    GENERAL:alert, no distress and comfortable SKIN: skin color, texture, turgor are normal, no rashes or significant lesions EYES: normal, conjunctiva are pink and non-injected, sclera clear OROPHARYNX:no exudate, no erythema and lips, buccal mucosa, and tongue normal  NECK: supple, thyroid normal size, non-tender, without  nodularity LYMPH:  no palpable lymphadenopathy in the cervical, axillary or inguinal LUNGS: clear to auscultation and percussion with normal breathing effort HEART: regular rate & rhythm and no murmurs and no lower extremity edema ABDOMEN:abdomen soft, non-tender and normal bowel sounds Musculoskeletal:no cyanosis of digits and no clubbing  PSYCH: alert & oriented x 3 with fluent speech NEURO: no focal motor/sensory deficits  LABORATORY DATA:  I have reviewed the data as listed  . CBC Latest Ref Rng 10/05/2015 09/29/2015 09/12/2015  WBC 4.0 - 10.5 K/uL 7.2 9.7 13.0(H)  Hemoglobin 12.0 - 15.0 g/dL 9.9(L) 10.3(L) 9.7(L)  Hematocrit 36.0 - 46.0 % 32.8(L) 32.8(L) 29.8(L)  Platelets 150 - 400 K/uL 345 377.0 256   . CBC    Component Value Date/Time   WBC 7.2 10/05/2015 2215   WBC 9.6 08/06/2015 0837   RBC 3.43* 10/05/2015 2215   RBC 4.32 08/06/2015 0837   RBC 2.08* 08/28/2014 0548   HGB 9.9* 10/05/2015 2215   HGB 11.1* 08/06/2015 0837   HCT 32.8* 10/05/2015 2215   HCT 36.0 08/06/2015 0837   HCT 18.1* 07/21/2015 1847   PLT 345 10/05/2015 2215   PLT 262 08/06/2015 0837   MCV 95.6 10/05/2015 2215   MCV 83.3 08/06/2015 0837   MCH 28.9 10/05/2015 2215   MCH 25.7 08/06/2015 0837   MCHC 30.2 10/05/2015 2215   MCHC 30.8* 08/06/2015 0837   RDW 18.8* 10/05/2015 2215   RDW 28.8* 08/06/2015 0837   LYMPHSABS 0.3* 10/05/2015 2215   LYMPHSABS 1.2 08/06/2015 0837   MONOABS 0.5 10/05/2015 2215   MONOABS 0.3 08/06/2015 0837   EOSABS 0.0 10/05/2015 2215   EOSABS 0.1 08/06/2015 0837   BASOSABS 0.0 10/05/2015 2215   BASOSABS 0.0 08/06/2015 0837     . CMP Latest Ref Rng 10/05/2015 09/12/2015 08/06/2015  Glucose 65 - 99 mg/dL 86 132(H) 153(H)  BUN 6 - 20 mg/dL 12 35(H) 18.0  Creatinine 0.44 - 1.00 mg/dL 0.53 0.45 0.7  Sodium 135 - 145 mmol/L 141 134(L) 137  Potassium 3.5 - 5.1 mmol/L 3.9 4.7 3.4(L)  Chloride 101 - 111 mmol/L 107 102 -  CO2 22 - 32 mmol/L 27 26 23   Calcium 8.9 - 10.3 mg/dL  8.7(L) 8.3(L) 8.6  Total Protein 6.5 - 8.1 g/dL 6.3(L) - 6.1(L)  Total Bilirubin 0.3 - 1.2 mg/dL 0.6 - 0.31  Alkaline Phos 38 - 126 U/L 120 - 58  AST 15 - 41 U/L 27 - 11  ALT 14 - 54 U/L 28 - 17       Lab Results  Component Value Date   IRON <11* 07/21/2015   TIBC 390 07/21/2015   IRONPCTSAT Not calculated due to Iron < linear range. 07/21/2015   (Iron and TIBC)  Lab Results  Component Value Date   FERRITIN 53 10/06/2015   B12: 392    EGD 07/23/2015: Findings: The examined esophagus was normal. There is no endoscopic evidence of esophagitis or hiatal hernia in the entire esophagus. One large, deep non-obstructing non-bleeding cratered benign-appearing gastric ulcer with no stigmata of bleeding was found on the anterior wall of the  lesser curvature of the gastric antrum, very close to the pylorus. The lesion was 20 mm by 40 mm in largest dimension. There is no evidence of perforation. Biopsies were taken with a cold forceps for Helicobacter pylori testing. One additional non-bleeding shallow cratered gastric ulcer with no stigmata of bleeding was found on the lesser curvature of the stomach and at the incisura. The lesion was 9 mm in largest dimension. The exam of the stomach was otherwise normal.  Colonoscopy 07/23/2015: - The entire visualized colon is normal, with the proviso that about 5% of the colon surface area was obscured by stool debris that could not be suctioned or irrigated effectively. - Stool in the cecum and scattered elsewhere precluding visualization in those areas. - The examined portion of the ileum was normal. - The distal rectum and anal verge are normal on retroflexion view. - No specimens collected.   RADIOGRAPHIC STUDIES: I have personally reviewed the radiological images as listed and agreed with the findings in the report. Dg Chest 2 View  09/12/2015  CLINICAL DATA:  LEFT thoracic pain and shortness of breath for 10 days, weakness and fatigue.  Similar symptoms 1 month ago, received a blood transfusion. EXAM: CHEST  2 VIEW COMPARISON:  Chest radiograph July 21, 2015 FINDINGS: Cardiomediastinal silhouette is normal. The lungs are clear without pleural effusions or focal consolidations. Trachea projects midline and there is no pneumothorax. Soft tissue planes are nonsuspicious. Moderate approximate T9 compression fracture with further height loss than prior radiograph. IMPRESSION: No acute cardiopulmonary process. Moderate approximate T9 compression fracture with suspected acute component, recommend correlation with point tenderness. Electronically Signed   By: Elon Alas M.D.   On: 09/12/2015 04:14   Ct Head Wo Contrast  10/05/2015  CLINICAL DATA:  Status post seizure and fall. Hit back of head. Initial encounter. EXAM: CT HEAD WITHOUT CONTRAST TECHNIQUE: Contiguous axial images were obtained from the base of the skull through the vertex without intravenous contrast. COMPARISON:  None. FINDINGS: There is no evidence of acute infarction, mass lesion, or intra- or extra-axial hemorrhage on CT. The posterior fossa, including the cerebellum, brainstem and fourth ventricle, is within normal limits. The third and lateral ventricles, and basal ganglia are unremarkable in appearance. The cerebral hemispheres are symmetric in appearance, with normal gray-white differentiation. No mass effect or midline shift is seen. There is no evidence of fracture; visualized osseous structures are unremarkable in appearance. The visualized portions of the orbits are within normal limits. The paranasal sinuses and mastoid air cells are well-aerated. Mild soft tissue swelling is noted at the occiput. IMPRESSION: 1. No evidence of traumatic intracranial injury or fracture. 2. Mild soft tissue swelling at the occiput. Electronically Signed   By: Garald Balding M.D.   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  Patient has no history of anemia and was having almost normal MCV previously that rules out the possibility of thalassemia or other hemoglobinopathy. Celiac panel previously negative. Patient was recently hospitalized in 07/2015 with a hemoglobin of 4.8 and received PRBC transfusion and IV Feraheme. Hemoglobin much improved at 11.2. EGD showed large gastric ulcer which is Helicobacter pylori positive which was likely cause of GI blood loss. Her ibuprofen use and prednisone also were additional factors causing gastric ulcer. hgb is relatively stable in the 9-10 range. Ferritin in gradually  trending downwards (reasonable currently at 53). No overt bleeding noted.  #2 B12 deficiency - patient notes that she did not take it due to headaches. B12 level WNL.on last visit and stable at 379 on labs today.  #3 Still's disease Plan -will start patient on Iron polysaccharide 150mg  po daily -continue B complex 1 tab po daily -on PPI for GU. -she was reminded that she will need to f/u with GI for consideration of rpt EGD to ensure healing of gastric ulcer. -monitor for acute GIB -absolutely continue to avoid NSAIDS -optimization of JRA treatment as per rheumatology. -RTC with Dr Irene Limbo with rpt cbc, cmp, ferritin, B12 in 3 months.  #4 Recent ED visit for seizure vs pseudoseizure vs syncope #5 Gradually increasing SOB/DOE -Patient strongly counseled to immediately call her PCP so that they could help her direct additional workup and management of these symptoms. -recommended not driving her car until she is cleared by her PCP.   Return to clinic with Dr. Irene Limbo in 12 weeks with labs CBC, CMP, ferritin, B12  All questions were answered. The patient knows to call the clinic with any problems, questions or concerns. I spent 25 minutes counseling the patient face to face. The total time spent in the  appointment was 25 minutes and more than 50% was on counseling.   Sullivan Lone MD [place] AAHIVMS [facility] Hematology/Oncology Physician [place]  (Office):       [phone] (Work cell):  [phone] (Fax):           [phone]

## 2015-10-07 NOTE — Telephone Encounter (Signed)
FYI: Dawn with [facility] states the Dr that does the  EEG will not return until the end of July.  order for EEG was put in today.  does the pt want to wait until end of july/ possibly august?    they are sending the pts that do not want to wait to the hospital  to get EEG done.  Neoma Laming is going to call Neuro and let them know to schedule at the hospital.

## 2015-10-08 NOTE — Telephone Encounter (Signed)
Contacted Kathy Rosales to let him know that [facility] does not do the EEG testing. Sent in the EEG to be done at [facility].

## 2015-10-08 NOTE — Telephone Encounter (Addendum)
Pt would like to have her EEG done at [facility] can you please put a order in for her to go along with the MRI that is already placed.

## 2015-10-08 NOTE — Addendum Note (Signed)
Addended by: Kateri Mc E on: 10/08/2015 12:16 PM   Modules accepted: Orders

## 2015-10-08 NOTE — Addendum Note (Signed)
Addended by: Kateri Mc E on: 10/08/2015 01:07 PM   Modules accepted: Orders

## 2015-10-09 ENCOUNTER — Ambulatory Visit
Admission: RE | Admit: 2015-10-09 | Discharge: 2015-10-09 | Disposition: A | Discharge status: Home / Self Care | Payer: Self-pay | Source: Ambulatory Visit | Attending: Family Medicine | Admitting: Family Medicine

## 2015-10-09 DIAGNOSIS — S22000A Wedge compression fracture of unspecified thoracic vertebra, initial encounter for closed fracture: Secondary | ICD-10-CM

## 2015-10-11 ENCOUNTER — Ambulatory Visit (INDEPENDENT_AMBULATORY_CARE_PROVIDER_SITE_OTHER): Payer: Self-pay | Admitting: Family Medicine

## 2015-10-11 ENCOUNTER — Encounter: Payer: Self-pay | Admitting: Family Medicine

## 2015-10-11 VITALS — BP 120/80 | HR 100 | Temp 98.0°F | Resp 12 | Ht 63.0 in | Wt 136.0 lb

## 2015-10-11 DIAGNOSIS — F419 Anxiety disorder, unspecified: Secondary | ICD-10-CM

## 2015-10-11 DIAGNOSIS — R569 Unspecified convulsions: Secondary | ICD-10-CM

## 2015-10-11 DIAGNOSIS — M546 Pain in thoracic spine: Secondary | ICD-10-CM

## 2015-10-11 MED ORDER — DULOXETINE HCL 30 MG PO CPEP: 30.0000 mg | ORAL_CAPSULE | Freq: Every day | ORAL | Status: DC

## 2015-10-11 MED ORDER — HYDROCODONE-ACETAMINOPHEN 5-325 MG PO TABS: 1.0000 | ORAL_TABLET | Freq: Three times a day (TID) | ORAL | Status: DC | PRN

## 2015-10-11 MED ORDER — LORAZEPAM 0.5 MG PO TABS: 0.5000 mg | ORAL_TABLET | Freq: Two times a day (BID) | ORAL | Status: AC | PRN

## 2015-10-11 NOTE — Patient Instructions (Addendum)
A few things to remember from today's visit:   Anxiety disorder, unspecified - Plan: DULoxetine (CYMBALTA) 30 MG capsule, LORazepam (ATIVAN) 0.5 MG tablet  Seizure-like activity ([place]) - Plan: Ambulatory referral to Neurology  Thoracic back pain, unspecified back pain laterality - Plan: HYDROcodone-acetaminophen (NORCO/VICODIN) 5-325 MG tablet    Pain is chronic and the goal with medication is to decrease pain to a level when you are able to function, pain will not be zero. Opioid medications have many side effects: constipation, nausea, vomiting,sedation, decreased psychomotor function, urinary retention, addiction among some.  Do not drive for 6 months.  Please be sure medication list is accurate. If a new problem present, please set up appointment sooner than planned today.

## 2015-10-11 NOTE — Progress Notes (Signed)
Pre visit review using our clinic review tool, if applicable. No additional management support is needed unless otherwise documented below in the visit note. 

## 2015-10-11 NOTE — Progress Notes (Signed)
HPI:   Kathy Rosales is a 40 y.o. female, who is here today with sister and friend to follow on recent office visit. I saw her on 09/29/2015, when she established care with me, her sister was not here with her. Sister is listing multiple symptoms, all of which are chronic and most likely related to her history of rheumatoid arthritis.  Sister is concerned about joint pain and edema, easy bruising, back pain, fatigue, depression, among some. She is also concerned about the fact she takes Prednisone daily.  Today she is following on recent ER visit, she was seen on 10/05/15 for possible seizure activity. No new symptoms reported.  According to sister, she got "paralyzed" and fell on floor, landed on her back. She describes her extremities being hyperextended, no shaking activity noted, no urine or bowel incontinence, and no beating of tongue. Episode lasted about 5 minutes, she was taken to the ER via EMS. According to sister she was slightly confused for hours after the episode. No prior history of seizure disorder.   Head CT 10/05/15: 1. No evidence of traumatic intracranial injury or fracture. 2. Mild soft tissue swelling at the occiput. I placed order for EEG and she was recommended at the ER to schedule appt with neurologists, which she has not done yet.   At her last visit, 09/29/2015, a thoracic back MRI was ordered to obtain more detail about a fracture seen in prior imaging, CXR. She didn't tolerate MRI machine, got anxious and started with back spasms, so she had to be rescheduled. She is asking for a prescription for something to relax her while she is having MRI.  Back pain is overall stable, he seems to be worse when she laying down and moving in bed during the night. Currently she is on tramadol, which had been prescribed by her rheumatologist.   CXR 09/12/15: No acute cardiopulmonary process.  Moderate approximate T9 compression fracture with suspected acute component,  recommend correlation with point tenderness.    Lab Results  Component Value Date   WBC 7.2 10/05/2015   HGB 9.9* 10/05/2015   HCT 32.8* 10/05/2015   MCV 95.6 10/05/2015   PLT 345 10/05/2015   Head CT: 10/05/15 1. No evidence of traumatic intracranial injury or fracture. 2. Mild soft tissue swelling at the occiput.   Concerns today: Anxiety/depression. Sister is reporting depression and anxiety, in general she is frustrated because her multiple health issues and the fact she doesn't have health insurance. She has not been able to work due to medical reasons. She has not tried any medication for depression or anxiety. She denies suicidal thoughts or prior psychiatric hospitalizations.  Since her last office visit she has followed with hematology for chronic anemia. She has not followed up with rheumatology, she states that she needs to set up a follow-up appointment.   Review of Systems  Constitutional: Positive for fatigue. Negative for fever, activity change, appetite change and unexpected weight change.  HENT: Negative for mouth sores, nosebleeds, sore throat and trouble swallowing.   Eyes: Negative for photophobia, pain and visual disturbance.  Respiratory: Negative for cough, shortness of breath and wheezing.        Former smoker.  Cardiovascular: Positive for leg swelling (at baseline). Negative for chest pain and palpitations.  Gastrointestinal: Negative for nausea, vomiting and abdominal pain.       Negative for changes in bowel habits or fecal incontinence.  Genitourinary: Negative for dysuria, hematuria and decreased urine volume.  Negative for urine incontinence.  Musculoskeletal: Positive for back pain, joint swelling and arthralgias (intermittently). Negative for neck pain.  Skin: Negative for pallor and wound.  Neurological: Positive for seizures. Negative for tremors, facial asymmetry, speech difficulty, weakness and numbness.  Psychiatric/Behavioral: Positive  for sleep disturbance. Negative for suicidal ideas and confusion. The patient is nervous/anxious.       Current Outpatient Prescriptions on File Prior to Visit  Medication Sig Dispense Refill  . b complex vitamins capsule Take 1 capsule by mouth daily. 60 capsule 3  . hydrOXYzine (ATARAX/VISTARIL) 25 MG tablet Take 1 tablet (25 mg total) by mouth 3 (three) times daily as needed. 30 tablet 0  . iron polysaccharides (NIFEREX) 150 MG capsule Take 1 capsule (150 mg total) by mouth daily. 30 capsule 3  . omeprazole (PRILOSEC) 20 MG capsule Take 1 capsule (20 mg total) by mouth 2 (two) times daily before a meal.     No current facility-administered medications on file prior to visit.     Past Medical History  Diagnosis Date  . 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 Blood transfusion without reported diagnosis    Allergies  Allergen Reactions  . Leflunomide Rash    Social History   Social History  . Marital Status: Legally Separated    Spouse Name: N/A  . Number of Children: N/A  . Years of Education: N/A   Social History Main Topics  . Smoking status: Never Smoker   . Smokeless tobacco: None  . Alcohol Use: No  . Drug Use: No  . Sexual Activity: Yes    Birth Control/ Protection: None   Other Topics Concern  . None   Social History Narrative    Filed Vitals:   10/11/15 0933  BP: 120/80  Pulse: 100  Temp: 98 F (36.7 C)  Resp: 12   Body mass index is 24.1 kg/(m^2).   SpO2 Readings from Last 3 Encounters:  10/11/15 98%  10/06/15 100%  10/06/15 100%      Physical Exam  Nursing note and vitals reviewed. Constitutional: She is oriented to person, place, and time. She appears well-developed and well-nourished. She does not appear ill. No distress.  HENT:  Head: Atraumatic.  Eyes: Conjunctivae and EOM are normal. Pupils are equal, round, and reactive to light.  Cardiovascular: Normal rate and regular rhythm.   No murmur heard. Pulses:      Dorsalis pedis pulses are 2+  on the right side, and 2+ on the left side.  Respiratory: Effort normal and breath sounds normal. No respiratory distress. She exhibits tenderness (Riht rib cage).  GI: Soft. She exhibits no mass. There is no hepatomegaly. There is no tenderness.  Musculoskeletal: She exhibits edema (trace pitting LE edema, bilateral L>R). She exhibits no tenderness.   No tenderness upon palpation of paraspinal muscles.   Neurological: She is alert and oriented to person, place, and time. She has normal strength. Coordination and gait normal.  Skin: Skin is warm. No rash noted. No erythema.  Psychiatric: Her speech is normal. Her mood appears anxious.  Flat mood. Well groomed, good eye contact.      ASSESSMENT AND PLAN:     Kathy Rosales was seen today for follow-up.  Diagnoses and all orders for this visit:  Seizure-like activity ([place])  ? Pseudoseizure.  For now we will hold on EEG, referral to neurology was placed. Stop tramadol. She was advised not to drive for at least 6 months.  -     Ambulatory referral  to Neurology  Thoracic back pain, unspecified back pain laterality  Stable. Lorazepam prescription given, recommended taking 1-2 tablets 15-20 minutes before MRI. Hydrocodone recommended instead Tramadol, some side effects discussed.   -     HYDROcodone-acetaminophen (NORCO/VICODIN) 5-325 MG tablet; Take 1 tablet by mouth every 8 (eight) hours as needed for moderate pain.  Anxiety disorder, unspecified  She agrees with trying Cymbalta, which can also help with chronic pain. Some side effects discussed. Follow-up in 4 weeks, before if needed.  -     DULoxetine (CYMBALTA) 30 MG capsule; Take 1 capsule (30 mg total) by mouth daily. -     LORazepam (ATIVAN) 0.5 MG tablet; Take 1 tablet (0.5 mg total) by mouth 2 (two) times daily as needed for anxiety or sedation. Take 1-2 tabs 15-20 min before MRI       In regard to her other chronic medical problems, rheumatoid arthritis and chronic  anemia among some, as well as current medications(excluding tramadol), I will let rheumatologist and hematologist to continue managing arthritis on anemia respectively. I explained Ms Ramirez's sister that most of the symptoms she is listing are chronic and related to some of her chronic conditions and/or chronic medications.    -She was advised to return or notify a doctor immediately if symptoms worsen or persist or new concerns arise.       Amaro Mangold G. Martinique, MD  [facility]. [place] office.

## 2015-10-12 ENCOUNTER — Ambulatory Visit: Payer: Self-pay | Attending: Internal Medicine

## 2015-10-20 ENCOUNTER — Encounter: Payer: Self-pay | Admitting: Family Medicine

## 2015-10-26 ENCOUNTER — Other Ambulatory Visit: Payer: Self-pay | Admitting: Family Medicine

## 2015-10-26 DIAGNOSIS — S22000A Wedge compression fracture of unspecified thoracic vertebra, initial encounter for closed fracture: Secondary | ICD-10-CM

## 2015-11-01 ENCOUNTER — Other Ambulatory Visit: Payer: Self-pay

## 2015-11-01 ENCOUNTER — Ambulatory Visit
Admission: RE | Admit: 2015-11-01 | Discharge: 2015-11-01 | Disposition: A | Discharge status: Home / Self Care | Payer: Self-pay | Source: Ambulatory Visit | Attending: Family Medicine | Admitting: Family Medicine

## 2015-11-01 DIAGNOSIS — S22000A Wedge compression fracture of unspecified thoracic vertebra, initial encounter for closed fracture: Secondary | ICD-10-CM

## 2015-11-04 ENCOUNTER — Encounter (HOSPITAL_COMMUNITY): Payer: Self-pay

## 2015-11-08 ENCOUNTER — Ambulatory Visit: Payer: Self-pay | Admitting: Family Medicine

## 2015-11-09 ENCOUNTER — Encounter (HOSPITAL_COMMUNITY): Payer: Self-pay | Admitting: *Deleted

## 2015-11-09 ENCOUNTER — Ambulatory Visit: Payer: Self-pay | Admitting: Family Medicine

## 2015-11-11 ENCOUNTER — Ambulatory Visit (INDEPENDENT_AMBULATORY_CARE_PROVIDER_SITE_OTHER): Payer: No Typology Code available for payment source | Admitting: Family Medicine

## 2015-11-11 ENCOUNTER — Encounter: Payer: Self-pay | Admitting: Family Medicine

## 2015-11-11 VITALS — BP 114/80 | HR 91 | Resp 12 | Ht 63.0 in | Wt 134.2 lb

## 2015-11-11 DIAGNOSIS — S22009D Unspecified fracture of unspecified thoracic vertebra, subsequent encounter for fracture with routine healing: Secondary | ICD-10-CM

## 2015-11-11 DIAGNOSIS — M25572 Pain in left ankle and joints of left foot: Secondary | ICD-10-CM

## 2015-11-11 DIAGNOSIS — F419 Anxiety disorder, unspecified: Secondary | ICD-10-CM

## 2015-11-11 MED ORDER — CELECOXIB 200 MG PO CAPS: 200.0000 mg | ORAL_CAPSULE | Freq: Every day | ORAL | 1 refills | Status: DC

## 2015-11-11 MED ORDER — TRAMADOL HCL 50 MG PO TABS: 50.0000 mg | ORAL_TABLET | Freq: Four times a day (QID) | ORAL | 0 refills | Status: DC | PRN

## 2015-11-11 MED ORDER — DULOXETINE HCL 30 MG PO CPEP: 30.0000 mg | ORAL_CAPSULE | Freq: Every day | ORAL | 1 refills | Status: DC

## 2015-11-11 NOTE — Progress Notes (Signed)
Pre visit review using our clinic review tool, if applicable. No additional management support is needed unless otherwise documented below in the visit note. 

## 2015-11-11 NOTE — Progress Notes (Signed)
HPI:   [name] is a 40 y.o. female, who is here today with her friend to follow on her last office visit, she was last seen on 10/11/2015, at that time she agreed with stopping Tramadol, trying Hydrocodone-Acetaminophen, and with starting Cymbalta. She was also referred to neurologist to evaluate for possible seizure episode. She has not heard about her appointment with neurologist. She has not had any other seizure like episode since her last visit.  She has been c/o left ankle pain and edema for a few weeks, no Hx of trauma, attributed to RA. 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 According to patient, she was told that left ankle pain was caused by "infection" and she needed to follow with her PCP. [name] is not sure when she is supposed to follow, another appointment was not arranged.  She also stopped taking Prednisone abruptly about 2 weeks ago, she feels like her fatigue is slightly worse but she hasn't noted any other problem, including arthralgias. She states that after Prednisone was discontinued her left ankle felt better and edema of LE's improved. She tells me that she read all the side effects of Prednisone, so she decided to stop it and she is not interested in resuming it. She has tried other medications for her rheumatoid arthritis, according to her friend, she does not try long enough because she is afraid of the side effects. She tells me that she tried Methotrexate and did not help.    She is still having thoracic back pain, which started around April 2017, sudden onset, no history of trauma. Pain is still "terrible" when she is not taking the Tramadol. She is taking Tramadol 50 mg every 3 hours, which helped with pain for about 3-3.5 hours.   She stopped Hydrocodone- Acetaminophen 5-325 mg because it wasn't helping. She stopped Cymbalta 30 mg because it wasn't  helping. She denies any side effect from these 2 medications.  Thoracic MRI was done 11/01/15: Acute/subacute minor compression fractures at T3, T4, T5, T8, T9, T10, T11 and T12. Fracture at T9 is somewhat more extensive with loss of height of 30-40%. Other fractures show loss of height of 10-20%. No retropulsed bone. No evidence of underlying bone lesion. Old healed minor compression deformities at T6 and T7.      Lab Results  Component Value Date   CREATININE 0.53 10/05/2015   BUN 12 10/05/2015   NA 141 10/05/2015   K 3.9 10/05/2015   CL 107 10/05/2015   CO2 27 10/05/2015    Last office visit, her sister was with her, she was concerned about anxiety and depression. In general she feels frustrated due to multiple medical problems, irritable. She denies any suicidal thoughts.   Review of Systems  Constitutional: Positive for fatigue. Negative for activity change, appetite change, fever and unexpected weight change.  HENT: Negative for mouth sores, nosebleeds, sore throat and trouble swallowing.   Eyes: Negative for photophobia, pain and visual disturbance.  Respiratory: Negative for cough, shortness of breath and wheezing.   Cardiovascular: Negative for chest pain, palpitations and leg swelling.  Gastrointestinal: Negative for abdominal pain, nausea and vomiting.       Negative for changes in bowel habits.  Genitourinary: Negative for decreased urine volume, difficulty urinating and hematuria.  Musculoskeletal: Positive for arthralgias (intermittently), back pain and joint swelling. Negative for neck pain.  Skin: Negative for rash and  wound.  Neurological: Negative for tremors, seizures, syncope, facial asymmetry, speech difficulty, weakness, numbness and headaches.  Psychiatric/Behavioral: Negative for confusion and suicidal ideas. The patient is nervous/anxious.       No current outpatient prescriptions on file prior to visit.   No current facility-administered medications  on file prior to visit.      Past Medical History:  Diagnosis Date  . Blood transfusion without reported diagnosis   . 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 Leflunomide Rash    Social History   Social History  . Marital status: Legally Separated    Spouse name: N/A  . Number of children: N/A  . Years of education: N/A   Social History Main Topics  . Smoking status: Never Smoker  . Smokeless tobacco: Never Used  . Alcohol use No  . Drug use: No  . Sexual activity: Yes    Birth control/ protection: None   Other Topics Concern  . None   Social History Narrative  . None    Vitals:   11/11/15 0813  BP: 114/80  Pulse: 91  Resp: 12   Body mass index is 23.78 kg/m.  O2 sat at RA 99%.  Physical Exam  Nursing note and vitals reviewed. Constitutional: She is oriented to person, place, and time. She appears well-developed and well-nourished. She does not appear ill. No distress.  HENT:  Head: Atraumatic.  Eyes: Conjunctivae and EOM are normal. Pupils are equal, round, and reactive to light.  Neck: No thyromegaly present.  Cardiovascular: Normal rate and regular rhythm.   No murmur heard. Pulses:      Dorsalis pedis pulses are 2+ on the right side, and 2+ on the left side.  Respiratory: Effort normal and breath sounds normal. No respiratory distress.  GI: Soft. She exhibits no mass. There is no hepatomegaly. There is no tenderness.  Musculoskeletal: She exhibits no edema.  Shoulders normal ROM, no pain elicited.  No tenderness upon palpation of paraspinal muscles. Limitation of flexion and wrist bilateral. No signs of synovitis. Left ankle: Tenderness upon palpation of  anterior talofibular ligament. ROM adequate.   Lymphadenopathy:    She has no cervical adenopathy.  Neurological: She is alert and oriented to person, place, and time. She has normal strength. Coordination and gait normal.  Skin: Skin is warm. No ecchymosis, no laceration  and no rash noted. No erythema.  Macular hyperpigmentation areas on lower extremities, pretibial.   Psychiatric: Her speech is normal. Her mood appears anxious.  Flat mood. Well groomed, good eye contact.      ASSESSMENT AND PLAN:     Kathy Rosales was seen today for follow-up.  Diagnoses and all orders for this visit:  Thoracic vertebral fracture, with routine healing, subsequent encounter  Because of thoracic vertebral fractures with no evidence of trauma DEXA was recommended to evaluate for osteoporosis. We clearly discussed side effects of Tramadol, because he helps with pain she will continue it but every 6 hours as needed. Celebrex as needed, side effects discussed. Follow-up in 6 weeks.  -     DG Bone Density; Future -     traMADol (ULTRAM) 50 MG tablet; Take 1 tablet (50 mg total) by mouth every 6 (six) hours as needed. -     celecoxib (CELEBREX) 200 MG capsule; Take 1 capsule (200 mg total) by mouth daily.  Left ankle pain  ? Sprain, RA, or OA. ABC PT exercises recommended. Since he has improved don't think imaging  is needed at this time.  Anxiety disorder, unspecified  She agrees with resuming Cymbalta 30 mg daily. She understands the risk of interaction with Tramadol. Instructed about warning signs. Follow-up in 6 weeks, before if needed.  -     DULoxetine (CYMBALTA) 30 MG capsule; Take 1 capsule (30 mg total) by mouth daily.     -Appointment with neurologist is pending. I still don't think she had a seizure episode, still I would like for her to be evaluated by neurologist. She understands.tramadol decrease at the threshold of seizure activity and can aggravate depression.  -She is not interested in resuming Prednisone, reviewed some side effects but we also reviewed the benefits and the adverse effects when stopped abruptly after several years. Instructed about warning signs. Currently she doesn't have symptoms of acute rheumatoid arthritis, if any she needs to set  up an appointment with her rheumatologist and she needs to consider other treatment options.      -[name] was advised to return sooner than planned today if new concerns arise.       Kathy Rosales G. Martinique, MD  [facility]. [place] office.

## 2015-11-11 NOTE — Patient Instructions (Addendum)
A few things to remember from today's visit:   Thoracic vertebral fracture, with routine healing, subsequent encounter - Plan: DG Bone Density, traMADol (ULTRAM) 50 MG tablet, celecoxib (CELEBREX) 200 MG capsule  Anxiety disorder, unspecified - Plan: DULoxetine (CYMBALTA) 30 MG capsule  Caution with Tramadol and Cymbalta, take Tramadol 4 per day max and in 2-3 weeks will try to decrease to 3 times daily.  Calcium 1200 mg daily, ideally through diet. Vit D 1000 U daily.    Please be sure medication list is accurate. If a new problem present, please set up appointment sooner than planned today.

## 2015-11-17 NOTE — Anesthesia Preprocedure Evaluation (Signed)
Anesthesia Evaluation  Patient identified by MRN, date of birth, ID band Patient awake  General Assessment Comment:Still's disease.  Reviewed: Allergy & Precautions, NPO status , Patient's Chart, lab work & pertinent test results  Airway Mallampati: II  TM Distance: >3 FB Neck ROM: Full    Dental no notable dental hx.    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, arthritis, rash)  Reproductive/Obstetrics negative OB ROS                             Anesthesia Physical  Anesthesia Plan  ASA: II  Anesthesia Plan: MAC   Post-op Pain Management:    Induction: Intravenous  Airway Management Planned: Natural Airway  Additional Equipment:   Intra-op Plan:   Post-operative Plan:   Informed Consent: I have reviewed the patients History and Physical, chart, labs and discussed the procedure including the risks, benefits and alternatives for the proposed anesthesia with the patient or authorized representative who has indicated his/her understanding and acceptance.   Dental advisory given  Plan Discussed with: CRNA  Anesthesia Plan Comments:         Anesthesia Quick Evaluation

## 2015-11-18 ENCOUNTER — Ambulatory Visit (HOSPITAL_COMMUNITY)
Admission: RE | Admit: 2015-11-18 | Discharge: 2015-11-18 | Disposition: A | Discharge status: Home / Self Care | Payer: No Typology Code available for payment source | Source: Ambulatory Visit | Attending: Gastroenterology | Admitting: Gastroenterology

## 2015-11-18 ENCOUNTER — Encounter (HOSPITAL_COMMUNITY)
Admission: RE | Disposition: A | Discharge status: Home / Self Care | Payer: Self-pay | Source: Ambulatory Visit | Attending: Gastroenterology

## 2015-11-18 ENCOUNTER — Other Ambulatory Visit: Payer: Self-pay | Admitting: Gastroenterology

## 2015-11-18 ENCOUNTER — Ambulatory Visit (HOSPITAL_COMMUNITY): Payer: No Typology Code available for payment source | Admitting: Anesthesiology

## 2015-11-18 ENCOUNTER — Encounter (HOSPITAL_COMMUNITY): Payer: Self-pay | Admitting: Registered Nurse

## 2015-11-18 DIAGNOSIS — Z791 Long term (current) use of non-steroidal anti-inflammatories (NSAID): Secondary | ICD-10-CM | POA: Insufficient documentation

## 2015-11-18 DIAGNOSIS — Z79899 Other long term (current) drug therapy: Secondary | ICD-10-CM | POA: Insufficient documentation

## 2015-11-18 DIAGNOSIS — K2950 Unspecified chronic gastritis without bleeding: Secondary | ICD-10-CM | POA: Insufficient documentation

## 2015-11-18 DIAGNOSIS — K259 Gastric ulcer, unspecified as acute or chronic, without hemorrhage or perforation: Secondary | ICD-10-CM | POA: Insufficient documentation

## 2015-11-18 HISTORY — PX: ESOPHAGOGASTRODUODENOSCOPY (EGD) WITH PROPOFOL: SHX5813

## 2015-11-18 SURGERY — ESOPHAGOGASTRODUODENOSCOPY (EGD) WITH PROPOFOL
Anesthesia: Monitor Anesthesia Care

## 2015-11-18 MED ORDER — LACTATED RINGERS IV SOLN: INTRAVENOUS | Status: DC

## 2015-11-18 MED ORDER — ARTIFICIAL TEARS OP OINT
TOPICAL_OINTMENT | OPHTHALMIC | Status: AC
  Filled 2015-11-18: qty 3.5

## 2015-11-18 MED ORDER — SODIUM CHLORIDE 0.9 % IV SOLN: INTRAVENOUS | Status: DC

## 2015-11-18 MED ORDER — PROPOFOL 10 MG/ML IV BOLUS
INTRAVENOUS | Status: AC
  Filled 2015-11-18: qty 20

## 2015-11-18 MED ORDER — PROPOFOL 500 MG/50ML IV EMUL
INTRAVENOUS | Status: DC | PRN
  Administered 2015-11-18: 400 ug/kg/min via INTRAVENOUS

## 2015-11-18 MED ORDER — LIDOCAINE HCL (CARDIAC) 20 MG/ML IV SOLN
INTRAVENOUS | Status: AC
  Filled 2015-11-18: qty 5

## 2015-11-18 MED ORDER — LACTATED RINGERS IV SOLN
INTRAVENOUS | Status: DC | PRN
  Administered 2015-11-18: 11:00:00 via INTRAVENOUS

## 2015-11-18 MED ORDER — GLYCOPYRROLATE 0.2 MG/ML IJ SOLN
INTRAMUSCULAR | Status: AC
  Filled 2015-11-18: qty 1

## 2015-11-18 MED ORDER — LIDOCAINE HCL (CARDIAC) 20 MG/ML IV SOLN
INTRAVENOUS | Status: DC | PRN
  Administered 2015-11-18: 75 mg via INTRAVENOUS

## 2015-11-18 MED ORDER — GLYCOPYRROLATE 0.2 MG/ML IJ SOLN
INTRAMUSCULAR | Status: DC | PRN
  Administered 2015-11-18: 0.2 mg via INTRAVENOUS

## 2015-11-18 SURGICAL SUPPLY — 14 items

## 2015-11-18 NOTE — Anesthesia Postprocedure Evaluation (Signed)
Anesthesia Post Note  Patient: Kathy Rosales  Procedure(s) Performed: Procedure(s) (LRB): ESOPHAGOGASTRODUODENOSCOPY (EGD) WITH PROPOFOL (N/A)  Patient location during evaluation: PACU Anesthesia Type: MAC Level of consciousness: awake and alert Pain management: pain level controlled Vital Signs Assessment: post-procedure vital signs reviewed and stable Respiratory status: spontaneous breathing, nonlabored ventilation, respiratory function stable and patient connected to nasal cannula oxygen Cardiovascular status: stable and blood pressure returned to baseline Anesthetic complications: no    Last Vitals:  Vitals:   11/18/15 1245 11/18/15 1252  BP:  (!) 89/56  Pulse: 97 (!) 101  Resp:  15  Temp:      Last Pain:  Vitals:   11/18/15 1058  TempSrc: Oral                 Reginal Lutes

## 2015-11-18 NOTE — Discharge Instructions (Addendum)
Esophagogastroduodenoscopy, Care After Refer to this sheet in the next few weeks. These instructions provide you with information about caring for yourself after your procedure. Your health care provider may also give you more specific instructions. Your treatment has been planned according to current medical practices, but problems sometimes occur. Call your health care provider if you have any problems or questions after your procedure. WHAT TO EXPECT AFTER THE PROCEDURE After your procedure, it is typical to feel:  Soreness in your throat.  Pain with swallowing.  Sick to your stomach (nauseous).  Bloated.  Dizzy.  Fatigued. HOME CARE INSTRUCTIONS  Do not eat or drink anything until the numbing medicine (local anesthetic) has worn off and your gag reflex has returned. You will know that the local anesthetic has worn off when you can swallow comfortably.  Do not drive or operate machinery until directed by your health care provider.  Take medicines only as directed by your health care provider. SEEK MEDICAL CARE IF:   You cannot stop coughing.  You are not urinating at all or less than usual. SEEK IMMEDIATE MEDICAL CARE IF:  You have difficulty swallowing.  You cannot eat or drink.  You have worsening throat or chest pain.  You have dizziness or lightheadedness or you faint.  You have nausea or vomiting.  You have chills.  You have a fever.  You have severe abdominal pain.  You have black, tarry, or bloody stools.   This information is not intended to replace advice given to you by your health care provider. Make sure you discuss any questions you have with your health care provider.   Document Released: 03/06/2012 Document Revised: 04/10/2014 Document Reviewed: 03/06/2012 Elsevier Interactive Patient Education 2016 [employer] TAKE OMEPRAZOLE (same as Prilosec) 20 mg ONCE A DAY.  AVOID CELEBREX, ADVIL, ASPIRIN.   LET ME KNOW IF FOR SOME REASON YOU HAVE TO BE  ON ONE OF THOSE MEDICATIONS.  MY OFFICE WILL CONTACT YOU WITH YOUR BIOPSY RESULTS IN ABOUT 1 WEEK, AND AT THAT TIME WILL ALSO ARRANGE A FOLLOW-UP APPOINTMENT.  YOU WILL NEED A REPEAT ENDOSCOPY IN ABOUT 3 MONTHS TO CONFIRM COMPLETE HEALING OF YOUR ULCER, WHICH LOOKED BETTER TODAY BUT WAS NOT COMPLETELY HEALED.

## 2015-11-18 NOTE — Op Note (Signed)
[facility] Patient Name: Kathy Rosales Procedure Date: 11/18/2015 MRN: [id number] Attending MD: Ronald Lobo , MD Date of Birth: [birth year] CSN: [id number] Age: 40 Admit Type: Outpatient Procedure:                Upper GI endoscopy Indications:              Follow-up of chronic gastric ulcer--presented w/                            hgb of 4 four months ago, while on ASA/NSAID's, H                            pylori positive now s/p Rx Providers:                Ronald Lobo, MD, Cleda Daub, RN, Ralene Bathe,                            Technician, Enrigue Catena, CRNA Referring MD:              Medicines:                Monitored Anesthesia Care Complications:            No immediate complications. Estimated Blood Loss:     Estimated blood loss was minimal. Procedure:                Pre-Anesthesia Assessment:                           - The patient came as an outpatient to the [facility] End[facility] Unit. Prior to the procedure, a                            History and Physical was performed, and patient                            medications and allergies were reviewed. The                            patient's tolerance of previous anesthesia was also                            reviewed. The risks and benefits of the procedure                            and the sedation options and risks were discussed                            with the patient. All questions were answered, and                            informed consent was obtained. Prior  Anticoagulants: The patient has taken no previous                            anticoagulant or antiplatelet agents. ASA Grade                            Assessment: II - A patient with mild systemic                            disease. After reviewing the risks and benefits,                            the patient was deemed in satisfactory condition to           undergo the procedure.                           After obtaining informed consent, the endoscope was                            passed under direct vision. Throughout the                            procedure, the patient's blood pressure, pulse, and                            oxygen saturations were monitored continuously. The                            [id number]) scope was introduced through the                            mouth, and advanced to the second part of duodenum.                            The upper GI endoscopy was accomplished without                            difficulty. The patient tolerated the procedure                            well. Scope In: Scope Out: Findings:      The examined esophagus was normal.      Two non-bleeding adjacent linear gastric ulcers with no stigmata of       bleeding were found on the anterior wall of the stomach and in the       gastric antrum. The largest lesion was 8 x 2 mm in largest dimension.       Biopsies were taken with a cold forceps for histology. Estimated blood       loss was minimal.      The exam of the stomach was otherwise normal.      The cardia and gastric fundus were normal on retroflexion.      The examined duodenum was normal. Impression:               -  Normal esophagus.                           - Residual non-bleeding gastric ulcers with no                            stigmata of bleeding. Biopsied.                           - Normal examined duodenum. Moderate Sedation:      This patient was sedated with monitored anesthesia care, not moderate       sedation. Recommendation:           - Await pathology results.                           - Repeat upper endoscopy in 3 months to check                            healing. In the meantime, rigorous NSAID avoidance                            (including Celebrex, which she has been on since                            her ulcer dx) and fastidious compliance  with daily                            PPI therapy.                           - Return to my office at appointment to be                            scheduled.                           - Resume previous diet.                           - Continue present medications. Procedure Code(s):        --- Professional ---                           [phone], Esophagogastroduodenoscopy, flexible,                            transoral; with biopsy, single or multiple Diagnosis Code(s):        --- Professional ---                           K25.7, Chronic gastric ulcer without hemorrhage or                            perforation CPT copyright 2016 American Medical Association. All rights reserved. The codes documented in this report are preliminary and upon coder review may  be revised to  meet current compliance requirements. Ronald Lobo, MD 11/18/2015 12:41:13 PM This report has been signed electronically. Number of Addenda: 0

## 2015-11-18 NOTE — H&P (Signed)
Kathy Rosales is an 40 y.o. female.   Chief Complaint: gastric ulcer HPI: Pt is 4 mos s/p presentation w/ profound anemia (hgb around 4), on Advil/ASA, egd at the time showed 2 gastric ulcers, one of which was fairly large.  H pylori +, Rx'd w/ amox/clarithro.  Now s/p several mos of antipeptic therapy and free of GI c/o's.  Past Medical History:  Diagnosis Date  . Blood transfusion without reported diagnosis   . Still's disease [facility])     Past Surgical History:  Procedure Laterality Date  . COLONOSCOPY WITH PROPOFOL N/A 07/23/2015   Procedure: COLONOSCOPY WITH PROPOFOL;  Surgeon: Ronald Lobo, MD;  Location: [facility] ENDOSCOPY;  Service: Endoscopy;  Laterality: N/A;  . ESOPHAGOGASTRODUODENOSCOPY (EGD) WITH PROPOFOL N/A 07/23/2015   Procedure: ESOPHAGOGASTRODUODENOSCOPY (EGD) WITH PROPOFOL;  Surgeon: Ronald Lobo, MD;  Location: [facility] ENDOSCOPY;  Service: Endoscopy;  Laterality: N/A;    Family History  Problem Relation Age of Onset  . Cancer Paternal Grandfather    Social History:  reports that she has never smoked. She has never used smokeless tobacco. She reports that she does not drink alcohol or use drugs.  Allergies:  Allergies  Allergen Reactions  . Leflunomide Rash    Medications Prior to Admission  Medication Sig Dispense Refill  . celecoxib (CELEBREX) 200 MG capsule Take 1 capsule (200 mg total) by mouth daily. 30 capsule 1  . DULoxetine (CYMBALTA) 30 MG capsule Take 1 capsule (30 mg total) by mouth daily. 30 capsule 1  . traMADol (ULTRAM) 50 MG tablet Take 1 tablet (50 mg total) by mouth every 6 (six) hours as needed. 60 tablet 0    No results found for this or any previous visit (from the past 48 hour(s)). No results found.  ROS No abd pn  Blood pressure 110/73, pulse 92, temperature 98.1 F (36.7 C), temperature source Oral, resp. rate 18, height 5\' 3"  (1.6 m), weight 60.8 kg (134 lb), last menstrual period 10/25/2015, SpO2 99 %. Physical Exam Healthy  appearing.  No pallor/icterus.  Chest clr, orophar benign, heart nl, abd nontender.  Assessment/Plan Gastric ulcers, H pylori s/p Rx--proceed to egd to confirm healing and check for eradication of H pylori.  Cleotis Nipper, MD 11/18/2015, 12:18 PM

## 2015-11-18 NOTE — Transfer of Care (Signed)
Immediate Anesthesia Transfer of Care Note  Patient: Kathy Rosales  Procedure(s) Performed: Procedure(s): ESOPHAGOGASTRODUODENOSCOPY (EGD) WITH PROPOFOL (N/A)  Patient Location: PACU  Anesthesia Type:MAC  Level of Consciousness: awake, alert , oriented and patient cooperative  Airway & Oxygen Therapy: Patient Spontanous Breathing and Patient connected to nasal cannula oxygen  Post-op Assessment: Report given to RN, Post -op Vital signs reviewed and stable and Patient moving all extremities X 4  Post vital signs: stable  Last Vitals:  Vitals:   11/18/15 1058  BP: 110/73  Pulse: 92  Resp: 18  Temp: 36.7 C    Last Pain:  Vitals:   11/18/15 1058  TempSrc: Oral         Complications: No apparent anesthesia complications

## 2015-11-19 ENCOUNTER — Encounter (HOSPITAL_COMMUNITY): Payer: Self-pay | Admitting: Gastroenterology

## 2015-11-26 ENCOUNTER — Ambulatory Visit: Payer: No Typology Code available for payment source | Admitting: Family Medicine

## 2015-11-26 DIAGNOSIS — Z0289 Encounter for other administrative examinations: Secondary | ICD-10-CM

## 2015-11-30 ENCOUNTER — Ambulatory Visit (HOSPITAL_BASED_OUTPATIENT_CLINIC_OR_DEPARTMENT_OTHER): Payer: No Typology Code available for payment source

## 2015-11-30 ENCOUNTER — Ambulatory Visit (HOSPITAL_BASED_OUTPATIENT_CLINIC_OR_DEPARTMENT_OTHER): Payer: No Typology Code available for payment source | Admitting: Nurse Practitioner

## 2015-11-30 ENCOUNTER — Other Ambulatory Visit: Payer: Self-pay

## 2015-11-30 VITALS — BP 90/59 | HR 97 | Temp 97.8°F | Resp 16 | Ht 63.0 in | Wt 130.3 lb

## 2015-11-30 DIAGNOSIS — D509 Iron deficiency anemia, unspecified: Secondary | ICD-10-CM

## 2015-11-30 DIAGNOSIS — R5383 Other fatigue: Secondary | ICD-10-CM

## 2015-11-30 DIAGNOSIS — D649 Anemia, unspecified: Secondary | ICD-10-CM

## 2015-11-30 DIAGNOSIS — I951 Orthostatic hypotension: Secondary | ICD-10-CM

## 2015-11-30 DIAGNOSIS — R5382 Chronic fatigue, unspecified: Secondary | ICD-10-CM

## 2015-11-30 LAB — COMPREHENSIVE METABOLIC PANEL
ALT: 13 U/L (ref 0–55)
AST: 24 U/L (ref 5–34)
Albumin: 3.2 g/dL — ABNORMAL LOW (ref 3.5–5.0)
Alkaline Phosphatase: 100 U/L (ref 40–150)
Anion Gap: 9 mEq/L (ref 3–11)
BUN: 9.1 mg/dL (ref 7.0–26.0)
CO2: 23 mEq/L (ref 22–29)
Calcium: 8.5 mg/dL (ref 8.4–10.4)
Chloride: 107 mEq/L (ref 98–109)
Creatinine: 0.6 mg/dL (ref 0.6–1.1)
EGFR: >90 mL/min/{1.73_m2} (ref >90)
Glucose: 106 mg/dl (ref 70–140)
Potassium: 3.5 mEq/L (ref 3.5–5.1)
Sodium: 139 mEq/L (ref 136–145)
Total Bilirubin: <0.3 mg/dL (ref 0.20–1.20)
Total Protein: 6 g/dL — ABNORMAL LOW (ref 6.4–8.3)

## 2015-11-30 LAB — CBC WITH DIFFERENTIAL/PLATELET
BASO%: 0.6 % (ref 0.0–2.0)
Basophils Absolute: 0 10*3/uL (ref 0.0–0.1)
EOS%: 13.7 % — ABNORMAL HIGH (ref 0.0–7.0)
Eosinophils Absolute: 0.4 10*3/uL (ref 0.0–0.5)
HCT: 36.5 % (ref 34.8–46.6)
HGB: 11.2 g/dL — ABNORMAL LOW (ref 11.6–15.9)
LYMPH%: 12.5 % — ABNORMAL LOW (ref 14.0–49.7)
MCH: 25.5 pg (ref 25.1–34.0)
MCHC: 30.8 g/dL — ABNORMAL LOW (ref 31.5–36.0)
MCV: 83 fL (ref 79.5–101.0)
MONO#: 0.2 10*3/uL (ref 0.1–0.9)
MONO%: 5.8 % (ref 0.0–14.0)
NEUT#: 2.1 10*3/uL (ref 1.5–6.5)
NEUT%: 67.4 % (ref 38.4–76.8)
Platelets: 197 10*3/uL (ref 145–400)
RBC: 4.39 10*6/uL (ref 3.70–5.45)
RDW: 19.4 % — ABNORMAL HIGH (ref 11.2–14.5)
WBC: 3.2 10*3/uL — ABNORMAL LOW (ref 3.9–10.3)
lymph#: 0.4 10*3/uL — ABNORMAL LOW (ref 0.9–3.3)

## 2015-11-30 LAB — DRAW EXTRA CLOT TUBE

## 2015-12-01 ENCOUNTER — Encounter: Payer: Self-pay | Admitting: Nurse Practitioner

## 2015-12-01 DIAGNOSIS — R5383 Other fatigue: Secondary | ICD-10-CM | POA: Insufficient documentation

## 2015-12-01 DIAGNOSIS — I951 Orthostatic hypotension: Secondary | ICD-10-CM | POA: Insufficient documentation

## 2015-12-01 NOTE — Assessment & Plan Note (Signed)
Patient reports to the Kathy Rosales today with complaint of chronic feeling of fatigue in general, low energy.  She denies any other new symptoms whatsoever.    Initial check of blood pressure was 104/73.  However, when orthostatic blood pressures checked-standing blood pressure was 104/70, sitting blood pressure was 9/80, and lying blood pressure was 90/59.  Confirmed the patient does not take any blood pressure medications whatsoever.  Patient was advised to change positions slowly; and to push fluids.  She may very well be mildly dehydrated-causing the hypotension.  Also, patient was advised to follow-up with primary care provider for further evaluation and management.  Patient states that she already has a scheduled appointment for further evaluation with her primary care physician Dr. [name] on 12/27/2015.  Advised patient she may want to try to reschedule her appointment for an earlier time if possible. She was also advised to go directly to the emergency department for any worsening symptoms whatsoever.

## 2015-12-01 NOTE — Assessment & Plan Note (Signed)
Patient has a history of chronic deficiency anemia.  Last ferritin checked was within normal limits.  Blood counts obtained today revealed WBC 3.2, ANC 2.1, hemoglobin 11.2, and platelet count 197.  Exam today reveals patient appears well.  Vital signs were essentially normal; with some hypotension when lying down to only.  Patient was afebrile.  Patient is scheduled to return on 01/03/2016 for labs, follow up visit

## 2015-12-01 NOTE — Assessment & Plan Note (Signed)
Patient reports to the [facility] today with complaint of chronic feeling of fatigue in general, low energy.  She denies any other new symptoms whatsoever.    Initial check of blood pressure was 104/73.  However, when orthostatic blood pressures checked-standing blood pressure was 104/70, sitting blood pressure was 9/80, and lying blood pressure was 90/59.  Confirmed the patient does not take any blood pressure medications whatsoever.  Patient was advised to change positions slowly; and to push fluids.  She may very well be mildly dehydrated-causing the hypotension.  Also, patient was advised to follow-up with primary care provider for further evaluation and management.  She was also advised to go directly to the emergency department for any worsening symptoms whatsoever.

## 2015-12-01 NOTE — Progress Notes (Signed)
SYMPTOM MANAGEMENT CLINIC    Chief Complaint: Fatigue  HPI:  Kathy Rosales 40 y.o. female diagnosed with deficiency anemia.  Currently undergoing observation only.    No history exists.    Review of Systems  Constitutional: Positive for malaise/fatigue.  All other systems reviewed and are negative.   Past Medical History:  Diagnosis Date  . Blood transfusion without reported diagnosis   . 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 COLONOSCOPY WITH PROPOFOL N/A 07/23/2015   Procedure: COLONOSCOPY WITH PROPOFOL;  Surgeon: Ronald Lobo, MD;  Location: [facility];  Service: Endoscopy;  Laterality: N/A;  . ESOPHAGOGASTRODUODENOSCOPY (EGD) WITH PROPOFOL N/A 07/23/2015   Procedure: ESOPHAGOGASTRODUODENOSCOPY (EGD) WITH PROPOFOL;  Surgeon: Ronald Lobo, MD;  Location: [facility];  Service: Endoscopy;  Laterality: N/A;  . 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    is allergic to leflunomide.    Medication List       Accurate as of 11/30/15 11:59 PM. Always use your most recent med list.          celecoxib 200 MG capsule Commonly known as:  CELEBREX Take 1 capsule (200 mg total) by mouth daily.   DULoxetine 30 MG capsule Commonly known as:  CYMBALTA Take 1 capsule (30 mg total) by mouth daily.   traMADol 50 MG tablet Commonly known as:  ULTRAM Take 1 tablet (50 mg total) by mouth every 6 (six) hours as needed.        PHYSICAL EXAMINATION  Oncology Vitals  11/30/2015 11/30/2015  Height - -  Weight - -  Weight (lbs) - -  BMI (kg/m2) - -  Temp - -  Pulse 97 96  Resp - -  SpO2 - -  BSA (m2) - -   BP Readings from Last 2 Encounters:  11/30/15 (!) 90/59  11/18/15 108/74    Physical Exam  Constitutional: She is oriented to person, place, and time and well-developed, well-nourished, and in no distress.  HENT:  Head: Normocephalic and atraumatic.  Eyes: Conjunctivae and EOM are normal. Pupils are equal, round, and reactive to light. Right eye exhibits no discharge. Left eye exhibits no discharge. No scleral icterus.  Neck: Normal range of motion.  Pulmonary/Chest: Effort normal. No respiratory distress.  Musculoskeletal: Normal range of motion.  Neurological: She is alert and oriented to person, place, and time. Gait normal.  Skin: Skin is warm and dry. No pallor.  Psychiatric: Affect normal.  Nursing note and vitals reviewed.   LABORATORY DATA:. Appointment on 11/30/2015  Component Date Value Ref Range Status  . WBC 11/30/2015 3.2* 3.9 - 10.3 10e3/uL Final  . NEUT# 11/30/2015 2.1  1.5 - 6.5 10e3/uL Final  . HGB 11/30/2015 11.2* 11.6 - 15.9 g/dL Final  . HCT 11/30/2015 36.5  34.8 - 46.6 % Final  . Platelets 11/30/2015 197  145 - 400 10e3/uL Final  . MCV 11/30/2015 83.0  79.5 - 101.0 fL Final  . MCH 11/30/2015 25.5  25.1 - 34.0 pg Final  . MCHC 11/30/2015 30.8* 31.5 - 36.0 g/dL Final  . RBC 11/30/2015 4.39  3.70 -  5.45 10e6/uL Final  . RDW 11/30/2015 19.4* 11.2 - 14.5 % Final  . lymph# 11/30/2015 0.4* 0.9 - 3.3 10e3/uL Final  . MONO# 11/30/2015 0.2  0.1 - 0.9 10e3/uL Final  . Eosinophils Absolute 11/30/2015 0.4  0.0 - 0.5 10e3/uL Final  . Basophils Absolute 11/30/2015 0.0  0.0 - 0.1 10e3/uL Final  . NEUT% 11/30/2015 67.4  38.4 - 76.8 % Final  . LYMPH% 11/30/2015 12.5* 14.0 - 49.7 % Final  . MONO% 11/30/2015 5.8  0.0 - 14.0 % Final  . EOS% 11/30/2015 13.7* 0.0 - 7.0 % Final  . BASO% 11/30/2015 0.6  0.0 - 2.0 % Final  . Sodium  11/30/2015 139  136 - 145 mEq/L Final  . Potassium 11/30/2015 3.5  3.5 - 5.1 mEq/L Final  . Chloride 11/30/2015 107  98 - 109 mEq/L Final  . CO2 11/30/2015 23  22 - 29 mEq/L Final  . Glucose 11/30/2015 106  70 - 140 mg/dl Final  . BUN 11/30/2015 9.1  7.0 - 26.0 mg/dL Final  . Creatinine 11/30/2015 0.6  0.6 - 1.1 mg/dL Final  . Total Bilirubin 11/30/2015 <0.30  0.20 - 1.20 mg/dL Final  . Alkaline Phosphatase 11/30/2015 100  40 - 150 U/L Final  . AST 11/30/2015 24  5 - 34 U/L Final  . ALT 11/30/2015 13  0 - 55 U/L Final  . Total Protein 11/30/2015 6.0* 6.4 - 8.3 g/dL Final  . Albumin 11/30/2015 3.2* 3.5 - 5.0 g/dL Final  . Calcium 11/30/2015 8.5  8.4 - 10.4 mg/dL Final  . Anion Gap 11/30/2015 9  3 - 11 mEq/L Final  . EGFR 11/30/2015 >90  >90 ml/min/1.73 m2 Final  . Extra Tube 11/30/2015 Sample held in lab for 7 days   Final    RADIOGRAPHIC STUDIES: No results found.  ASSESSMENT/PLAN:    Orthostatic hypotension Patient reports to the [facility] today with complaint of chronic feeling of fatigue in general, low energy.  She denies any other new symptoms whatsoever.    Initial check of blood pressure was 104/73.  However, when orthostatic blood pressures checked-standing blood pressure was 104/70, sitting blood pressure was 9/80, and lying blood pressure was 90/59.  Confirmed the patient does not take any blood pressure medications whatsoever.  Patient was advised to change positions slowly; and to push fluids.  She may very well be mildly dehydrated-causing the hypotension.  Also, patient was advised to follow-up with primary care provider for further evaluation and management.  She was also advised to go directly to the emergency department for any worsening symptoms whatsoever.  Iron deficiency anemia Patient has a history of chronic deficiency anemia.  Last ferritin checked was within normal limits.  Blood counts obtained today revealed WBC 3.2, ANC 2.1, hemoglobin 11.2, and  platelet count 197.  Exam today reveals patient appears well.  Vital signs were essentially normal; with some hypotension when lying down to only.  Patient was afebrile.  Patient is scheduled to return on 01/03/2016 for labs, follow up visit  Fatigue Patient reports to the [facility] today with complaint of chronic feeling of fatigue in general, low energy.  She denies any other new symptoms whatsoever.    Initial check of blood pressure was 104/73.  However, when orthostatic blood pressures checked-standing blood pressure was 104/70, sitting blood pressure was 9/80, and lying blood pressure was 90/59.  Confirmed the patient does not take any blood pressure medications whatsoever.  Patient was advised to change positions slowly; and  to push fluids.  She may very well be mildly dehydrated-causing the hypotension.  Also, patient was advised to follow-up with primary care provider for further evaluation and management.  Patient states that she already has a scheduled appointment for further evaluation with her primary care physician Dr. [name] on 12/27/2015.  Advised patient she may want to try to reschedule her appointment for an earlier time if possible. She was also advised to go directly to the emergency department for any worsening symptoms whatsoever.   Patient stated understanding of all instructions; and was in agreement with this plan of care. The patient knows to call the clinic with any problems, questions or concerns.   Total time spent with patient was 15 minutes;  with greater than 75 percent of that time spent in face to face counseling regarding patient's symptoms,  and coordination of care and follow up.  Disclaimer:This dictation was prepared with Dragon/digital dictation along with [employer]. Any transcriptional errors that result from this process are unintentional.  Drue Second, NP 12/01/2015

## 2015-12-08 ENCOUNTER — Emergency Department (HOSPITAL_COMMUNITY)
Admission: EM | Admit: 2015-12-08 | Discharge: 2015-12-09 | Disposition: A | Discharge status: Home / Self Care | Payer: Self-pay | Attending: Emergency Medicine | Admitting: Emergency Medicine

## 2015-12-08 ENCOUNTER — Encounter (HOSPITAL_COMMUNITY): Payer: Self-pay | Admitting: Emergency Medicine

## 2015-12-08 ENCOUNTER — Emergency Department (HOSPITAL_COMMUNITY): Payer: Self-pay

## 2015-12-08 DIAGNOSIS — N83202 Unspecified ovarian cyst, left side: Secondary | ICD-10-CM | POA: Insufficient documentation

## 2015-12-08 DIAGNOSIS — R102 Pelvic and perineal pain: Secondary | ICD-10-CM | POA: Insufficient documentation

## 2015-12-08 LAB — URINE MICROSCOPIC-ADD ON: RBC / HPF: NONE SEEN RBC/hpf (ref 0–5)

## 2015-12-08 LAB — URINALYSIS, ROUTINE W REFLEX MICROSCOPIC
Bilirubin Urine: NEGATIVE
Glucose, UA: NEGATIVE mg/dL
Hgb urine dipstick: NEGATIVE
Ketones, ur: NEGATIVE mg/dL
Nitrite: NEGATIVE
Protein, ur: NEGATIVE mg/dL
Specific Gravity, Urine: 1.013 (ref 1.005–1.030)
pH: 8.5 — ABNORMAL HIGH (ref 5.0–8.0)

## 2015-12-08 LAB — CBC
HCT: 38.6 % (ref 36.0–46.0)
Hemoglobin: 11.7 g/dL — ABNORMAL LOW (ref 12.0–15.0)
MCH: 25.1 pg — ABNORMAL LOW (ref 26.0–34.0)
MCHC: 30.3 g/dL (ref 30.0–36.0)
MCV: 82.7 fL (ref 78.0–100.0)
Platelets: 437 10*3/uL — ABNORMAL HIGH (ref 150–400)
RBC: 4.67 MIL/uL (ref 3.87–5.11)
RDW: 16.8 % — ABNORMAL HIGH (ref 11.5–15.5)
WBC: 4.4 10*3/uL (ref 4.0–10.5)

## 2015-12-08 LAB — COMPREHENSIVE METABOLIC PANEL
ALT: 55 U/L — ABNORMAL HIGH (ref 14–54)
AST: 100 U/L — ABNORMAL HIGH (ref 15–41)
Albumin: 3.4 g/dL — ABNORMAL LOW (ref 3.5–5.0)
Alkaline Phosphatase: 80 U/L (ref 38–126)
Anion gap: 7 (ref 5–15)
BUN: 6 mg/dL (ref 6–20)
CO2: 22 mmol/L (ref 22–32)
Calcium: 9 mg/dL (ref 8.9–10.3)
Chloride: 106 mmol/L (ref 101–111)
Creatinine, Ser: 0.52 mg/dL (ref 0.44–1.00)
GFR calc Af Amer: >60 mL/min (ref >60)
GFR calc non Af Amer: >60 mL/min (ref >60)
Glucose, Bld: 101 mg/dL — ABNORMAL HIGH (ref 65–99)
Potassium: 3.6 mmol/L (ref 3.5–5.1)
Sodium: 135 mmol/L (ref 135–145)
Total Bilirubin: 0.1 mg/dL — ABNORMAL LOW (ref 0.3–1.2)
Total Protein: 6 g/dL — ABNORMAL LOW (ref 6.5–8.1)

## 2015-12-08 LAB — RAPID HIV SCREEN (HIV 1/2 AB+AG)
HIV 1/2 Antibodies: NONREACTIVE
HIV-1 P24 Antigen - HIV24: NONREACTIVE

## 2015-12-08 LAB — WET PREP, GENITAL
Sperm: NONE SEEN
Trich, Wet Prep: NONE SEEN
Yeast Wet Prep HPF POC: NONE SEEN

## 2015-12-08 LAB — LIPASE, BLOOD: Lipase: 29 U/L (ref 11–51)

## 2015-12-08 LAB — I-STAT CG4 LACTIC ACID, ED: Lactic Acid, Venous: 1.05 mmol/L (ref 0.5–1.9)

## 2015-12-08 LAB — HCG, QUANTITATIVE, PREGNANCY: hCG, Beta Chain, Quant, S: 1 m[IU]/mL (ref <5)

## 2015-12-08 MED ORDER — DEXTROSE 5 % IV SOLN
1.0000 g | Freq: Once | INTRAVENOUS | Status: AC
  Administered 2015-12-09: 1 g via INTRAVENOUS
  Filled 2015-12-08: qty 10

## 2015-12-08 MED ORDER — MORPHINE SULFATE (PF) 4 MG/ML IV SOLN
4.0000 mg | Freq: Once | INTRAVENOUS | Status: AC
  Administered 2015-12-08: 4 mg via INTRAVENOUS
  Filled 2015-12-08: qty 1

## 2015-12-08 MED ORDER — AZITHROMYCIN 250 MG PO TABS
1000.0000 mg | ORAL_TABLET | Freq: Once | ORAL | Status: AC
  Administered 2015-12-09: 1000 mg via ORAL
  Filled 2015-12-08: qty 4

## 2015-12-08 MED ORDER — IOPAMIDOL (ISOVUE-300) INJECTION 61%
INTRAVENOUS | Status: AC
  Administered 2015-12-08: 100 mL
  Filled 2015-12-08: qty 100

## 2015-12-08 MED ORDER — SODIUM CHLORIDE 0.9 % IV BOLUS (SEPSIS)
1000.0000 mL | Freq: Once | INTRAVENOUS | Status: AC
  Administered 2015-12-08: 1000 mL via INTRAVENOUS

## 2015-12-08 MED ORDER — ONDANSETRON HCL 4 MG/2ML IJ SOLN
4.0000 mg | Freq: Once | INTRAMUSCULAR | Status: AC
  Administered 2015-12-08: 4 mg via INTRAVENOUS
  Filled 2015-12-08: qty 2

## 2015-12-08 NOTE — ED Notes (Signed)
Pt. Shivering and oral temp noted to be 97.5. Rectal temp taken at this time at 99.9. Pt. Given 1 blanket.

## 2015-12-08 NOTE — ED Triage Notes (Signed)
Pt states about two weeks ago she started "feeling bad, and I have some bone disease". Pt also c/o generalized body aches and poor appetite. Pt c/o n/v. Pt difficult to understand what type of bone disease due to language barrier. Pt does not remember what the disease is called.

## 2015-12-08 NOTE — ED Notes (Signed)
Pt. Wheeled to the restroom at this time. Pt. C/o feeling weak.

## 2015-12-08 NOTE — ED Notes (Signed)
Pelvic cart at bedside. 

## 2015-12-08 NOTE — ED Triage Notes (Signed)
Was able to speak to family, pt has "Still's disease".

## 2015-12-08 NOTE — ED Provider Notes (Signed)
Kathy Rosales DEPT Provider Note   CSN: [id number] Arrival date & time: 12/08/15  1719     History   Chief Complaint Chief Complaint  Patient presents with  . Headache  . Generalized Body Aches    HPI Kathy Rosales is a 40 y.o. female.  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 History obtained through brother-in-law who is at bedside. I did offer patient a medical interpreter but patient declined. Patient report ongoing lower abdominal pain for the past 2 weeks. She described pain as a sharp nonradiating pain, worsening with eating. She endorsed having nausea and occasional nonbloody nonbilious vomit. She has not passed flatus or having bowel movement for the past week. She endorsed chills, temporal headache, and generalized body aches. She has been taking her tramadol at home without adequate relief. 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 She denies any recent travel. She is a G1 P1, last menstrual period was 2 weeks ago. She denies any prior abdominal surgery. Denies any fever, URI symptoms aside from mild runny nose, chest pain, shortness of breath, dysuria, hematuria, vaginal bleeding or vaginal discharge. No change in sexual partner.  History reviewed. No pertinent past medical history.  There are no active problems to display for this patient.   History reviewed. No pertinent surgical history.  OB History    No data available       Home Medications    Prior to Admission medications   Not on File    Family History No family history on file.  Social History Social History  Substance Use Topics  . Smoking status: Never Smoker  . Smokeless tobacco: Not on file  . Alcohol use No     Allergies   Review of patient's allergies indicates no known  allergies.   Review of Systems Review of Systems  All other systems reviewed and are negative.    Physical Exam Updated Vital Signs BP 108/70 (BP Location: Right Arm)   Pulse 95   Temp 99.9 F (37.7 C) (Rectal)   Resp 15   LMP 11/17/2015   SpO2 100%   Physical Exam  Constitutional: She appears well-developed and well-nourished. No distress.  HENT:  Head: Atraumatic.  Eyes: Conjunctivae are normal.  Neck: Neck supple.  Cardiovascular: Normal rate and regular rhythm.   Pulmonary/Chest: Effort normal and breath sounds normal.  Abdominal: Soft. There is tenderness (Tenderness to suprapubic region without guarding or rebound tenderness. Negative Murphy sign, no pain at McBurney's point.).  Genitourinary:  Genitourinary Comments: Chaperone present during exam. Normal external genitalia. No inguinal lymphadenopathy or inguinal hernia noted. Pain with speculum insertion. Mild vaginal discharge noted in vaginal vault. Visualized cervical os is closed. On bimanual examination bilateral adnexal tenderness with cervical motion tenderness on exam.  Neurological: She is alert.  Skin: No rash noted.  Psychiatric: She has a normal mood and affect.  Nursing note and vitals reviewed.    ED Treatments / Results  Labs (all labs ordered are listed, but only abnormal results are displayed) Labs Reviewed  WET PREP, GENITAL - Abnormal; Notable for the following:       Result Value   Clue Cells Wet Prep HPF POC PRESENT (*)    WBC, Wet Prep HPF POC MANY (*)    All other components within normal limits  COMPREHENSIVE METABOLIC PANEL - Abnormal; Notable for the following:    Glucose, Bld 101 (*)    Total Protein 6.0 (*)    Albumin 3.4 (*)    AST 100 (*)    ALT 55 (*)    Total Bilirubin 0.1 (*)    All other components within normal limits  CBC - Abnormal; Notable for the following:    Hemoglobin 11.7 (*)    MCH 25.1 (*)    RDW 16.8 (*)    Platelets 437 (*)    All other components within  normal limits  URINALYSIS, ROUTINE W REFLEX MICROSCOPIC (NOT AT [facility]) - Abnormal; Notable for the following:    APPearance CLOUDY (*)    pH 8.5 (*)    Leukocytes, UA MODERATE (*)    All other components within normal limits  URINE MICROSCOPIC-ADD ON - Abnormal; Notable for the following:    Squamous Epithelial / LPF 6-30 (*)    Bacteria, UA RARE (*)    All other components within normal limits  LIPASE, BLOOD  RAPID HIV SCREEN (HIV 1/2 AB+AG)  HCG, QUANTITATIVE, PREGNANCY  RPR  CBG MONITORING, ED  I-STAT CG4 LACTIC ACID, ED  GC/CHLAMYDIA PROBE AMP ([place]) NOT AT [facility]    EKG  EKG Interpretation None       Radiology Ct Abdomen Pelvis W Contrast  Result Date: 12/09/2015 CLINICAL DATA:  Acute onset of nausea and vomiting. Periumbilical abdominal pain, subacute onset. Initial encounter. EXAM: CT ABDOMEN AND PELVIS WITH CONTRAST TECHNIQUE: Multidetector CT imaging of the abdomen and pelvis was performed using the standard protocol following bolus administration of intravenous contrast. CONTRAST:  100 mL ISOVUE-300 IOPAMIDOL (ISOVUE-300) INJECTION 61% COMPARISON:  CT of the abdomen and pelvis from 09/09/2008 FINDINGS: Lower chest: A 2.2 cm rounded opacity at the left lung base likely reflects atelectasis, though mild infection might have a similar appearance. Mild bibasilar atelectasis is noted. The visualized portions of the mediastinum are unremarkable. Hepatobiliary: The liver is unremarkable in appearance. There is no evidence for abnormal enhancement of the hepatic capsule to suggest Fitz-Hugh-Curtis syndrome. The gallbladder is grossly unremarkable. The common bile duct remains normal in caliber. Pancreas: The pancreas is grossly unremarkable in appearance. Spleen: The spleen is within normal limits. Adrenals/Urinary Tract: The adrenal glands are grossly unremarkable. The kidneys are within normal limits. There is no evidence of hydronephrosis. No renal or ureteral stones are seen. No  perinephric stranding is appreciated. Stomach/Bowel: The stomach is unremarkable in appearance. The small bowel is within normal limits. The appendix is normal in caliber and contains air, without evidence appendicitis. A small metallic density is noted within the mid sigmoid colon. Vascular/Lymphatic: The abdominal aorta is unremarkable in appearance. There is no evidence of calcific atherosclerotic disease. The inferior vena cava is unremarkable in appearance. No retroperitoneal lymphadenopathy is seen. No pelvic sidewall lymphadenopathy is appreciated. Reproductive: The bladder is mildly distended and contains a small amount of contrast. The uterus is grossly unremarkable in appearance. A large mildly complex cystic structure is noted anterior to the uterus, possibly arising from the left ovary, measuring 7.0 cm. Other: No significant soft tissue abnormalities are otherwise seen. Musculoskeletal: There are compression deformities of vertebral bodies T9 through T12, and of L5, most prominent at T9. These appear to be chronic in nature. There appear to be partially healed fractures of the left superior and inferior pubic rami. IMPRESSION: 1. No evidence of abnormal hepatic capsule enhancement to suggest Fitz-Hugh-Curtis syndrome; would correlate with the patient's vaginal  swab results, given clinical concern. 2. **An incidental finding of potential clinical significance has been found. Large 7.0 cm mildly complex cystic structure anterior to the uterus, possibly arising from the left ovary. Pelvic ultrasound would be helpful for further evaluation, when and as deemed clinically appropriate.** 3. Small metallic density incidentally noted within the mid sigmoid colon, likely reflecting an ingested small foreign body. 4. Compression deformities of vertebral bodies T9 through T12, and of L5, most prominent at T9. These appear to be chronic in nature. 5. Partially healed fractures of the left superior and inferior pubic  rami. Electronically Signed   By: Garald Balding M.D.   On: 12/09/2015 00:50    Procedures Procedures (including critical care time)  Medications Ordered in ED Medications  cefTRIAXone (ROCEPHIN) 1 g in dextrose 5 % 50 mL IVPB (not administered)  azithromycin (ZITHROMAX) tablet 1,000 mg (not administered)  acetaminophen (TYLENOL) tablet 650 mg (not administered)  sodium chloride 0.9 % bolus 1,000 mL (0 mLs Intravenous Stopped 12/08/15 2239)  morphine 4 MG/ML injection 4 mg (4 mg Intravenous Given 12/08/15 2211)  ondansetron (ZOFRAN) injection 4 mg (4 mg Intravenous Given 12/08/15 2211)  iopamidol (ISOVUE-300) 61 % injection (100 mLs  Contrast Given 12/08/15 2353)     Initial Impression / Assessment and Plan / ED Course  I have reviewed the triage vital signs and the nursing notes.  Pertinent labs & imaging results that were available during my care of the patient were reviewed by me and considered in my medical decision making (see chart for details).  Clinical Course   BP 108/70 (BP Location: Right Arm)   Pulse 95   Temp 99.9 F (37.7 C) (Rectal)   Resp 15   LMP 11/17/2015   SpO2 100%    Final Clinical Impressions(s) / ED Diagnoses   Final diagnoses:  Pelvic pain in female  Cyst of left ovary    New Prescriptions New Prescriptions   No medications on file   1:02 AM Pt here with abd pain, n/v, generalized body aches x 5 days.  Does have suprapubic tenderness on exam.  Pt has bilateral adnexal tenderness and cervical motion tenderness on pelvic exam.  Rocephin/zithromax given along with IVF and pain medication.  No leukocytosis but evidence of mild transaminitis.  Abd/pelvis CT scan with evidence of 7cm adnexal cyst.  No evidence of appendicitis or Fitz-Hugh-Curtis syndrome.  Will obtain pelvic US to r/o ovarian torsion or TOA.  Care discussed with Dr. Betsey Holiday, who will d/c pt pending [place] result.  Pt currently states she feels better, just having a headache.  She has no nuchal  rigidity to suggest meningitis.  Tylenol given.  Pt is updated with plan.     Domenic Moras, PA-C 12/09/15 1605    Duffy Bruce, MD 12/10/15 [phone]

## 2015-12-09 ENCOUNTER — Emergency Department (HOSPITAL_COMMUNITY): Payer: Self-pay

## 2015-12-09 LAB — GC/CHLAMYDIA PROBE AMP (~~LOC~~) NOT AT ARMC
Chlamydia: NEGATIVE
Neisseria Gonorrhea: NEGATIVE

## 2015-12-09 LAB — RPR: RPR Ser Ql: NONREACTIVE

## 2015-12-09 MED ORDER — HYDROCODONE-ACETAMINOPHEN 5-325 MG PO TABS: 1.0000 | ORAL_TABLET | ORAL | 0 refills | Status: DC | PRN

## 2015-12-09 MED ORDER — PROMETHAZINE HCL 25 MG PO TABS: 25.0000 mg | ORAL_TABLET | Freq: Four times a day (QID) | ORAL | 0 refills | Status: DC | PRN

## 2015-12-09 MED ORDER — IBUPROFEN 800 MG PO TABS: 800.0000 mg | ORAL_TABLET | Freq: Three times a day (TID) | ORAL | 0 refills | Status: DC

## 2015-12-09 MED ORDER — ACETAMINOPHEN 325 MG PO TABS
650.0000 mg | ORAL_TABLET | Freq: Once | ORAL | Status: AC
  Administered 2015-12-09: 650 mg via ORAL
  Filled 2015-12-09: qty 2

## 2015-12-09 NOTE — ED Notes (Signed)
Pt provided with d/c instructions at this time. Pt verbalizes understanding of d/c instructions as well as follow up procedure after d/c. Pt provided with RX for ibuprofen, phenergan, and norco. Pt verbalizes understanding of RX directions. Pt in no apparent distress at this time. Pt assisted out of the ED via [place] at this time.

## 2015-12-09 NOTE — ED Provider Notes (Signed)
Patient signed out to me to follow-up on pelvic ultrasound. Patient has been seen with generalized malaise, nausea, vomiting. She had associated abdominal discomfort. Abdominal CT revealed large cyst. Ultrasound performed. This confirms hemorrhagic cyst, no other acute abnormality noted. Patient is hemodynamically stable, there is no evidence of free fluid in the pelvis. Patient will be discharged with analgesia and is to follow-up with OB/GYN as an outpatient.  Results for orders placed or performed during the hospital encounter of 12/08/15  Wet prep, genital  Result Value Ref Range   Yeast Wet Prep HPF POC NONE SEEN NONE SEEN   Trich, Wet Prep NONE SEEN NONE SEEN   Clue Cells Wet Prep HPF POC PRESENT (A) NONE SEEN   WBC, Wet Prep HPF POC MANY (A) NONE SEEN   Sperm NONE SEEN   Lipase, blood  Result Value Ref Range   Lipase 29 11 - 51 U/L  Comprehensive metabolic panel  Result Value Ref Range   Sodium 135 135 - 145 mmol/L   Potassium 3.6 3.5 - 5.1 mmol/L   Chloride 106 101 - 111 mmol/L   CO2 22 22 - 32 mmol/L   Glucose, Bld 101 (H) 65 - 99 mg/dL   BUN 6 6 - 20 mg/dL   Creatinine, Ser 0.52 0.44 - 1.00 mg/dL   Calcium 9.0 8.9 - 10.3 mg/dL   Total Protein 6.0 (L) 6.5 - 8.1 g/dL   Albumin 3.4 (L) 3.5 - 5.0 g/dL   AST 100 (H) 15 - 41 U/L   ALT 55 (H) 14 - 54 U/L   Alkaline Phosphatase 80 38 - 126 U/L   Total Bilirubin 0.1 (L) 0.3 - 1.2 mg/dL   GFR calc non Af Amer >60 >60 mL/min   GFR calc Af Amer >60 >60 mL/min   Anion gap 7 5 - 15  CBC  Result Value Ref Range   WBC 4.4 4.0 - 10.5 K/uL   RBC 4.67 3.87 - 5.11 MIL/uL   Hemoglobin 11.7 (L) 12.0 - 15.0 g/dL   HCT 38.6 36.0 - 46.0 %   MCV 82.7 78.0 - 100.0 fL   MCH 25.1 (L) 26.0 - 34.0 pg   MCHC 30.3 30.0 - 36.0 g/dL   RDW 16.8 (H) 11.5 - 15.5 %   Platelets 437 (H) 150 - 400 K/uL  Urinalysis, Routine w reflex microscopic  Result Value Ref Range   Color, Urine YELLOW YELLOW   APPearance CLOUDY (A) CLEAR   Specific Gravity, Urine  1.013 1.005 - 1.030   pH 8.5 (H) 5.0 - 8.0   Glucose, UA NEGATIVE NEGATIVE mg/dL   Hgb urine dipstick NEGATIVE NEGATIVE   Bilirubin Urine NEGATIVE NEGATIVE   Ketones, ur NEGATIVE NEGATIVE mg/dL   Protein, ur NEGATIVE NEGATIVE mg/dL   Nitrite NEGATIVE NEGATIVE   Leukocytes, UA MODERATE (A) NEGATIVE  Urine microscopic-add on  Result Value Ref Range   Squamous Epithelial / LPF 6-30 (A) NONE SEEN   WBC, UA 0-5 0 - 5 WBC/hpf   RBC / HPF NONE SEEN 0 - 5 RBC/hpf   Bacteria, UA RARE (A) NONE SEEN  Rapid HIV screen (HIV 1/2 Ab+Ag)  Result Value Ref Range   HIV-1 P24 Antigen - HIV24 NON REACTIVE NON REACTIVE   HIV 1/2 Antibodies NON REACTIVE NON REACTIVE   Interpretation (HIV Ag Ab)      A non reactive test result means that HIV 1 or HIV 2 antibodies and HIV 1 p24 antigen were not detected in the specimen.  hCG, quantitative, pregnancy  Result Value Ref Range   hCG, Beta Chain, Quant, S 1 <5 mIU/mL  I-Stat CG4 Lactic Acid, ED  Result Value Ref Range   Lactic Acid, Venous 1.05 0.5 - 1.9 mmol/L   US Transvaginal Non-ob  Result Date: 12/09/2015 CLINICAL DATA:  40 year old female with pelvic pain x2 weeks. Rule out tubo-ovarian abscess. EXAM: TRANSABDOMINAL AND TRANSVAGINAL ULTRASOUND OF PELVIS DOPPLER ULTRASOUND OF OVARIES TECHNIQUE: Both transabdominal and transvaginal ultrasound examinations of the pelvis were performed. Transabdominal technique was performed for global imaging of the pelvis including uterus, ovaries, adnexal regions, and pelvic cul-de-sac. It was necessary to proceed with endovaginal exam following the transabdominal exam to visualize the abdominal CT dated 12/09/2015. Color and duplex Doppler ultrasound was utilized to evaluate blood flow to the ovaries. COMPARISON:  CT dated 12/09/2015 FINDINGS: Uterus Measurements: 6.8 x 3.7 x 4.7 cm. No fibroids or other mass visualized. Endometrium Thickness: 10 mm.  No focal abnormality visualized. Right ovary Measurements: 3.6 x 1.5 x 1.8  cm. Normal appearance/no adnexal mass. Left ovary Measurements: 5.0 x 2.2 x 3.1 cm. There is a 2.0 x 1.7 x 1.4 cm complex/hemorrhagic cyst or corpus luteum in the left ovary. This structure appears to be within the ovary and not adjacent to the ovary and therefore the possibility of an ectopic pregnancy is less likely. Correlation with HCG levels recommended. There is a 6.3 x 4.5 x 5.4 cm left paraovarian cyst with a single septation. No definite flow identified within the septation. Yearly follow-up with ultrasound is recommended. Pulsed Doppler evaluation of both ovaries demonstrates normal low-resistance arterial and venous waveforms. Other findings No abnormal free fluid. IMPRESSION: Left ovarian hemorrhagic/complex cyst or corpus luteum as well as a large left para ovarian cyst. Annual follow-up with ultrasound is recommended. No sonographic evidence of torsion. Unremarkable uterus and the right ovary. Electronically Signed   By: Anner Crete M.D.   On: 12/09/2015 02:33   US Pelvis Complete  Result Date: 12/09/2015 CLINICAL DATA:  40 year old female with pelvic pain x2 weeks. Rule out tubo-ovarian abscess. EXAM: TRANSABDOMINAL AND TRANSVAGINAL ULTRASOUND OF PELVIS DOPPLER ULTRASOUND OF OVARIES TECHNIQUE: Both transabdominal and transvaginal ultrasound examinations of the pelvis were performed. Transabdominal technique was performed for global imaging of the pelvis including uterus, ovaries, adnexal regions, and pelvic cul-de-sac. It was necessary to proceed with endovaginal exam following the transabdominal exam to visualize the abdominal CT dated 12/09/2015. Color and duplex Doppler ultrasound was utilized to evaluate blood flow to the ovaries. COMPARISON:  CT dated 12/09/2015 FINDINGS: Uterus Measurements: 6.8 x 3.7 x 4.7 cm. No fibroids or other mass visualized. Endometrium Thickness: 10 mm.  No focal abnormality visualized. Right ovary Measurements: 3.6 x 1.5 x 1.8 cm. Normal appearance/no adnexal  mass. Left ovary Measurements: 5.0 x 2.2 x 3.1 cm. There is a 2.0 x 1.7 x 1.4 cm complex/hemorrhagic cyst or corpus luteum in the left ovary. This structure appears to be within the ovary and not adjacent to the ovary and therefore the possibility of an ectopic pregnancy is less likely. Correlation with HCG levels recommended. There is a 6.3 x 4.5 x 5.4 cm left paraovarian cyst with a single septation. No definite flow identified within the septation. Yearly follow-up with ultrasound is recommended. Pulsed Doppler evaluation of both ovaries demonstrates normal low-resistance arterial and venous waveforms. Other findings No abnormal free fluid. IMPRESSION: Left ovarian hemorrhagic/complex cyst or corpus luteum as well as a large left para ovarian cyst. Annual follow-up with ultrasound is recommended.  No sonographic evidence of torsion. Unremarkable uterus and the right ovary. Electronically Signed   By: Anner Crete M.D.   On: 12/09/2015 02:33   Ct Abdomen Pelvis W Contrast  Result Date: 12/09/2015 CLINICAL DATA:  Acute onset of nausea and vomiting. Periumbilical abdominal pain, subacute onset. Initial encounter. EXAM: CT ABDOMEN AND PELVIS WITH CONTRAST TECHNIQUE: Multidetector CT imaging of the abdomen and pelvis was performed using the standard protocol following bolus administration of intravenous contrast. CONTRAST:  100 mL ISOVUE-300 IOPAMIDOL (ISOVUE-300) INJECTION 61% COMPARISON:  CT of the abdomen and pelvis from 09/09/2008 FINDINGS: Lower chest: A 2.2 cm rounded opacity at the left lung base likely reflects atelectasis, though mild infection might have a similar appearance. Mild bibasilar atelectasis is noted. The visualized portions of the mediastinum are unremarkable. Hepatobiliary: The liver is unremarkable in appearance. There is no evidence for abnormal enhancement of the hepatic capsule to suggest Fitz-Hugh-Curtis syndrome. The gallbladder is grossly unremarkable. The common bile duct remains  normal in caliber. Pancreas: The pancreas is grossly unremarkable in appearance. Spleen: The spleen is within normal limits. Adrenals/Urinary Tract: The adrenal glands are grossly unremarkable. The kidneys are within normal limits. There is no evidence of hydronephrosis. No renal or ureteral stones are seen. No perinephric stranding is appreciated. Stomach/Bowel: The stomach is unremarkable in appearance. The small bowel is within normal limits. The appendix is normal in caliber and contains air, without evidence appendicitis. A small metallic density is noted within the mid sigmoid colon. Vascular/Lymphatic: The abdominal aorta is unremarkable in appearance. There is no evidence of calcific atherosclerotic disease. The inferior vena cava is unremarkable in appearance. No retroperitoneal lymphadenopathy is seen. No pelvic sidewall lymphadenopathy is appreciated. Reproductive: The bladder is mildly distended and contains a small amount of contrast. The uterus is grossly unremarkable in appearance. A large mildly complex cystic structure is noted anterior to the uterus, possibly arising from the left ovary, measuring 7.0 cm. Other: No significant soft tissue abnormalities are otherwise seen. Musculoskeletal: There are compression deformities of vertebral bodies T9 through T12, and of L5, most prominent at T9. These appear to be chronic in nature. There appear to be partially healed fractures of the left superior and inferior pubic rami. IMPRESSION: 1. No evidence of abnormal hepatic capsule enhancement to suggest Fitz-Hugh-Curtis syndrome; would correlate with the patient's vaginal swab results, given clinical concern. 2. **An incidental finding of potential clinical significance has been found. Large 7.0 cm mildly complex cystic structure anterior to the uterus, possibly arising from the left ovary. Pelvic ultrasound would be helpful for further evaluation, when and as deemed clinically appropriate.** 3. Small  metallic density incidentally noted within the mid sigmoid colon, likely reflecting an ingested small foreign body. 4. Compression deformities of vertebral bodies T9 through T12, and of L5, most prominent at T9. These appear to be chronic in nature. 5. Partially healed fractures of the left superior and inferior pubic rami. Electronically Signed   By: Garald Balding M.D.   On: 12/09/2015 00:50   [place] Art/ven Flow Abd Pelv Doppler  Result Date: 12/09/2015 CLINICAL DATA:  40 year old female with pelvic pain x2 weeks. Rule out tubo-ovarian abscess. EXAM: TRANSABDOMINAL AND TRANSVAGINAL ULTRASOUND OF PELVIS DOPPLER ULTRASOUND OF OVARIES TECHNIQUE: Both transabdominal and transvaginal ultrasound examinations of the pelvis were performed. Transabdominal technique was performed for global imaging of the pelvis including uterus, ovaries, adnexal regions, and pelvic cul-de-sac. It was necessary to proceed with endovaginal exam following the transabdominal exam to visualize the abdominal CT dated  12/09/2015. Color and duplex Doppler ultrasound was utilized to evaluate blood flow to the ovaries. COMPARISON:  CT dated 12/09/2015 FINDINGS: Uterus Measurements: 6.8 x 3.7 x 4.7 cm. No fibroids or other mass visualized. Endometrium Thickness: 10 mm.  No focal abnormality visualized. Right ovary Measurements: 3.6 x 1.5 x 1.8 cm. Normal appearance/no adnexal mass. Left ovary Measurements: 5.0 x 2.2 x 3.1 cm. There is a 2.0 x 1.7 x 1.4 cm complex/hemorrhagic cyst or corpus luteum in the left ovary. This structure appears to be within the ovary and not adjacent to the ovary and therefore the possibility of an ectopic pregnancy is less likely. Correlation with HCG levels recommended. There is a 6.3 x 4.5 x 5.4 cm left paraovarian cyst with a single septation. No definite flow identified within the septation. Yearly follow-up with ultrasound is recommended. Pulsed Doppler evaluation of both ovaries demonstrates normal low-resistance  arterial and venous waveforms. Other findings No abnormal free fluid. IMPRESSION: Left ovarian hemorrhagic/complex cyst or corpus luteum as well as a large left para ovarian cyst. Annual follow-up with ultrasound is recommended. No sonographic evidence of torsion. Unremarkable uterus and the right ovary. Electronically Signed   By: Anner Crete M.D.   On: 12/09/2015 02:33      Orpah Greek, MD 12/09/15 [phone]

## 2015-12-10 ENCOUNTER — Encounter (HOSPITAL_COMMUNITY): Payer: Self-pay | Admitting: Emergency Medicine

## 2015-12-10 ENCOUNTER — Emergency Department (HOSPITAL_COMMUNITY): Payer: Self-pay

## 2015-12-10 ENCOUNTER — Emergency Department (HOSPITAL_COMMUNITY)
Admission: EM | Admit: 2015-12-10 | Discharge: 2015-12-12 | Disposition: A | Discharge status: Other Acute Inpatient Hospital | Payer: Federal, State, Local not specified - Other

## 2015-12-10 DIAGNOSIS — R4182 Altered mental status, unspecified: Secondary | ICD-10-CM | POA: Insufficient documentation

## 2015-12-10 DIAGNOSIS — F323 Major depressive disorder, single episode, severe with psychotic features: Secondary | ICD-10-CM | POA: Diagnosis present

## 2015-12-10 DIAGNOSIS — R4589 Other symptoms and signs involving emotional state: Secondary | ICD-10-CM

## 2015-12-10 DIAGNOSIS — F329 Major depressive disorder, single episode, unspecified: Secondary | ICD-10-CM

## 2015-12-10 DIAGNOSIS — R440 Auditory hallucinations: Secondary | ICD-10-CM

## 2015-12-10 DIAGNOSIS — Z79899 Other long term (current) drug therapy: Secondary | ICD-10-CM | POA: Insufficient documentation

## 2015-12-10 DIAGNOSIS — D649 Anemia, unspecified: Secondary | ICD-10-CM

## 2015-12-10 DIAGNOSIS — R45851 Suicidal ideations: Secondary | ICD-10-CM

## 2015-12-10 LAB — COMPREHENSIVE METABOLIC PANEL
ALT: 46 U/L (ref 14–54)
AST: 45 U/L — ABNORMAL HIGH (ref 15–41)
Albumin: 3.7 g/dL (ref 3.5–5.0)
Alkaline Phosphatase: 75 U/L (ref 38–126)
Anion gap: 7 (ref 5–15)
BUN: 6 mg/dL (ref 6–20)
CO2: 22 mmol/L (ref 22–32)
Calcium: 8.6 mg/dL — ABNORMAL LOW (ref 8.9–10.3)
Chloride: 113 mmol/L — ABNORMAL HIGH (ref 101–111)
Creatinine, Ser: 0.64 mg/dL (ref 0.44–1.00)
GFR calc Af Amer: >60 mL/min (ref >60)
GFR calc non Af Amer: >60 mL/min (ref >60)
Glucose, Bld: 87 mg/dL (ref 65–99)
Potassium: 3.8 mmol/L (ref 3.5–5.1)
Sodium: 142 mmol/L (ref 135–145)
Total Bilirubin: 0.3 mg/dL (ref 0.3–1.2)
Total Protein: 6 g/dL — ABNORMAL LOW (ref 6.5–8.1)

## 2015-12-10 LAB — RAPID URINE DRUG SCREEN, HOSP PERFORMED
Amphetamines: NOT DETECTED
Barbiturates: NOT DETECTED
Benzodiazepines: NOT DETECTED
Cocaine: NOT DETECTED
Opiates: NOT DETECTED
Tetrahydrocannabinol: NOT DETECTED

## 2015-12-10 LAB — SALICYLATE LEVEL: Salicylate Lvl: <4 mg/dL (ref 2.8–30.0)

## 2015-12-10 LAB — CBC WITH DIFFERENTIAL/PLATELET
Basophils Absolute: 0 10*3/uL (ref 0.0–0.1)
Basophils Relative: 1 %
Eosinophils Absolute: 0.3 10*3/uL (ref 0.0–0.7)
Eosinophils Relative: 6 %
HCT: 36.6 % (ref 36.0–46.0)
Hemoglobin: 11.4 g/dL — ABNORMAL LOW (ref 12.0–15.0)
Lymphocytes Relative: 24 %
Lymphs Abs: 1.2 10*3/uL (ref 0.7–4.0)
MCH: 25.6 pg — ABNORMAL LOW (ref 26.0–34.0)
MCHC: 31.1 g/dL (ref 30.0–36.0)
MCV: 82.2 fL (ref 78.0–100.0)
Monocytes Absolute: 0.4 10*3/uL (ref 0.1–1.0)
Monocytes Relative: 9 %
Neutro Abs: 2.9 10*3/uL (ref 1.7–7.7)
Neutrophils Relative %: 60 %
Platelets: 499 10*3/uL — ABNORMAL HIGH (ref 150–400)
RBC: 4.45 MIL/uL (ref 3.87–5.11)
RDW: 16.5 % — ABNORMAL HIGH (ref 11.5–15.5)
WBC: 4.7 10*3/uL (ref 4.0–10.5)

## 2015-12-10 LAB — POC URINE PREG, ED: Preg Test, Ur: NEGATIVE

## 2015-12-10 LAB — ACETAMINOPHEN LEVEL: Acetaminophen (Tylenol), Serum: <10 ug/mL — ABNORMAL LOW (ref 10–30)

## 2015-12-10 LAB — TSH: TSH: 2.756 u[IU]/mL (ref 0.350–4.500)

## 2015-12-10 LAB — ETHANOL: Alcohol, Ethyl (B): <5 mg/dL (ref <5)

## 2015-12-10 MED ORDER — TRAMADOL HCL 50 MG PO TABS
150.0000 mg | ORAL_TABLET | ORAL | Status: DC | PRN
  Administered 2015-12-11 – 2015-12-12 (×3): 150 mg via ORAL
  Filled 2015-12-10 (×3): qty 3

## 2015-12-10 MED ORDER — OLANZAPINE 5 MG PO TBDP
5.0000 mg | ORAL_TABLET | Freq: Once | ORAL | Status: AC
  Administered 2015-12-10: 5 mg via ORAL
  Filled 2015-12-10: qty 1

## 2015-12-10 MED ORDER — PROMETHAZINE HCL 25 MG PO TABS: 25.0000 mg | ORAL_TABLET | Freq: Four times a day (QID) | ORAL | Status: DC | PRN

## 2015-12-10 MED ORDER — IBUPROFEN 800 MG PO TABS
800.0000 mg | ORAL_TABLET | Freq: Once | ORAL | Status: AC
  Administered 2015-12-10: 800 mg via ORAL
  Filled 2015-12-10: qty 1

## 2015-12-10 NOTE — ED Notes (Addendum)
The patient's motherEmili Abdulrahman) will be taking the patient's belongings with her when the pt is transfered to [facility].

## 2015-12-10 NOTE — ED Notes (Signed)
Patient reports having auditory hallucinations today. States she feels like they are worse. States they tell her "to run". Currently denies the voices telling  Her to harm self or others. Denies any SI tonight. Walking with help from nurse when brought back to unit. Assured undersigned that she is able to walk on her own and it was witnessed by staff. Patient currently wanting to request blankets for being cold and a coke to drink. Appeared to be guarded with some paranoia. Wanted door to stay open.

## 2015-12-10 NOTE — ED Notes (Signed)
Patient holding hands over ears and pacing in room at times. Reports voices worse. Physician notified. Now order of Zyprexa given.

## 2015-12-10 NOTE — BH Assessment (Signed)
Assessment Note  Kathy Rosales is an 40 y.o. female that presents this date with AH telling her to "run into traffic." Patient has no prior history of inpatient [place] admissions or OP treatment history for any symptoms associated with substance abuse or mental health issues. Patient is oriented to place stating "hospital" when asked but finds it difficult to answer other questions in reference to the assessment. 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 Husband states patient has never been on any mental health medications or displayed any behaviors associated with AH until today.Patient presents with a very fearful affect and is incoherent at times not understanding this writer's questions. Collateral information from husband stated patient's sister was at their residence this date and observed patient acting "very strange like she was hearing something" and would not respond to her questions. Patient became agitated and eventually stated voices were "telling her to run, run out into the cars." Patient denies any previous S/I, H/I or VH shaking her head "no" to any AH prior to today. Patient seems to be displaying intermittent thought blocking and her affect is very flat. Patient seems not to process the content of this writer's questions at times. Admission note states: "Pt reports command hallucination telling her to "get hit by a car."  Pt is shaking, states she is cold-warm blanket given to pt.  Pt is alert and oriented to person only.  When asked if her family can visit, pt states "no." When asked if pt has any questions. Pt states "Am I safe here?"  Pt is calm and cooperative at this time. Only c/o  generalized body aches. Case was staffed with Reita Cliche DNP who recommended an inpatient admission as patient continues to be monitored.  Diagnosis: Altered Mental State  Past Medical History: History reviewed. No pertinent past medical history.  History reviewed. No pertinent surgical history.  Family History: No family history on file.  Social History:  reports that she has never smoked. She does not have any smokeless tobacco history on file. She reports that she does not drink alcohol. Her drug history is not on file.  Additional Social History:  Alcohol / Drug Use Pain Medications: See MAR Prescriptions: See MAR Over the Counter: See MAR History of alcohol / drug use?: No history of alcohol / drug abuse  CIWA: CIWA-Ar BP: 90/74 Pulse Rate: 90 COWS:    Allergies: No Known Allergies  Home Medications:  (Not in a hospital admission)  OB/GYN Status:  Patient's last menstrual period was 11/17/2015.  General Assessment Data Location of Assessment: [facility] ED TTS Assessment: In system Is this a Tele or Face-to-Face Assessment?: Face-to-Face Is this an Initial Assessment or a Re-assessment for this encounter?: Initial Assessment Marital status: Married [place] name: na Is patient pregnant?: No Pregnancy Status: No Living Arrangements: Spouse/significant other Can pt return to current living arrangement?: Yes Admission Status: Voluntary Is patient capable of signing voluntary admission?: Yes Referral Source: Self/Family/Friend Insurance type: [place] Screening Exam ([place]) Medical Exam completed: Yes  Crisis Care Plan Living Arrangements: Spouse/significant other Legal Guardian: Other: (na) Name of Psychiatrist: none Name of Therapist: none  Education Status Is patient currently in school?: No  Current Grade: na Highest grade of school patient has completed: 12 Name of school: na Contact person: na  Risk to self with the past 6  months Suicidal Ideation: No Has patient been a risk to self within the past 6 months prior to admission? : No Suicidal Intent: No Has patient had any suicidal intent within the past 6 months prior to admission? : No Is patient at risk for suicide?: No Suicidal Plan?: No Has patient had any suicidal plan within the past 6 months prior to admission? : No Access to Means: No What has been your use of drugs/alcohol within the last 12 months?: denies Previous Attempts/Gestures: No How many times?: 0 Other Self Harm Risks: none Triggers for Past Attempts: Unknown Intentional Self Injurious Behavior: None Family Suicide History: No Recent stressful life event(s): Other (Comment) (none) Persecutory voices/beliefs?: No Depression: No Depression Symptoms:  (NA) Substance abuse history and/or treatment for substance abuse?: No Suicide prevention information given to non-admitted patients: Not applicable  Risk to Others within the past 6 months Homicidal Ideation: No Does patient have any lifetime risk of violence toward others beyond the six months prior to admission? : No Thoughts of Harm to Others: No Current Homicidal Intent: No Current Homicidal Plan: No Access to Homicidal Means: No Identified Victim: na History of harm to others?: No Assessment of Violence: None Noted Violent Behavior Description: na Does patient have access to weapons?: No Criminal Charges Pending?: No Does patient have a court date: No Is patient on probation?: No  Psychosis Hallucinations: Auditory Delusions: None noted  Mental Status Report Appearance/Hygiene: Unremarkable Eye Contact: Fair Motor Activity: Freedom of movement Speech: Incoherent Level of Consciousness: Quiet/awake Mood: Fearful Affect: Blunted Anxiety Level: Moderate Thought Processes: Thought Blocking Judgement: Partial Orientation: Place Obsessive Compulsive Thoughts/Behaviors: None  Cognitive Functioning Concentration:  Decreased Memory: Recent Impaired IQ: Average Insight: Unable to Assess Impulse Control: Unable to Assess Appetite: Fair Weight Loss: 0 Weight Gain: 0 Sleep: Decreased Total Hours of Sleep: 5 Vegetative Symptoms: None  ADLScreening [facility] Assessment Services) Patient's cognitive ability adequate to safely complete daily activities?: Yes Patient able to express need for assistance with ADLs?: Yes Independently performs ADLs?: Yes (appropriate for developmental age)  Prior Inpatient Therapy Prior Inpatient Therapy: No Prior Therapy Dates: na Prior Therapy Facilty/Provider(s): na Reason for Treatment: na  Prior Outpatient Therapy Prior Outpatient Therapy: No Prior Therapy Dates: na Prior Therapy Facilty/Provider(s): na Reason for Treatment: na Does patient have an ACCT team?: No Does patient have Intensive In-House Services?  : No Does patient have [facility] services? : No Does patient have P4CC services?: No  ADL Screening (condition at time of admission) Patient's cognitive ability adequate to safely complete daily activities?: Yes Is the patient deaf or have difficulty hearing?: No Does the patient have difficulty seeing, even when wearing glasses/contacts?: No Does the patient have difficulty concentrating, remembering, or making decisions?: No Patient able to express need for assistance with ADLs?: Yes Does the patient have difficulty dressing or bathing?: No Independently performs ADLs?: Yes (appropriate for developmental age) Does the patient have difficulty walking or climbing stairs?: No Weakness of Legs: None Weakness of Arms/Hands: None  Home Assistive Devices/Equipment Home Assistive Devices/Equipment: None  Therapy Consults (therapy consults require a physician order) PT Evaluation Needed: No OT Evalulation Needed: No SLP Evaluation Needed: No Abuse/Neglect Assessment (Assessment to be complete while patient is alone) Physical Abuse: Denies Verbal Abuse:  Denies Sexual Abuse: Denies Exploitation of patient/patient's resources: Denies Self-Neglect: Denies Values / Beliefs  Cultural Requests During Hospitalization: None Spiritual Requests During Hospitalization: None Consults Spiritual Care Consult Needed: No Social Work Consult Needed: No Regulatory affairs officer (For Healthcare) Does patient have an advance directive?: No Would patient like information on creating an advanced directive?: No - patient declined information    Additional Information 1:1 In Past 12 Months?: No CIRT Risk: No Elopement Risk: No Does patient have medical clearance?: Yes     Disposition: Case was staffed with Reita Cliche DNP who recommended an inpatient admission as patient continues to be monitored.  Disposition Initial Assessment Completed for this Encounter: Yes Disposition of Patient: Inpatient treatment program Type of inpatient treatment program: Adult  On Site Evaluation by:   Reviewed with Physician:    Mamie Nick 12/10/2015 6:51 PM

## 2015-12-10 NOTE — BH Assessment (Signed)
[place] Assessment Progress Note  Case was staffed with Reita Cliche DNP who recommended an inpatient admission as patient continues to be monitored.

## 2015-12-10 NOTE — ED Notes (Signed)
Bed: [id number] Expected date:  Expected time:  Means of arrival:  Comments: SI

## 2015-12-10 NOTE — ED Provider Notes (Signed)
Level V caveat. PsychiatriccomplaintPatient feeling depressed for approximate the past 2 weeks with diminished appetite hearing voices telling her to "run"she is alert, depressed affect  Cranial nerves II through XII grossly intact moves all extremities well gait normal oriented 3 and CTA head ordered as hallucinations new in onset Results for orders placed or performed during the hospital encounter of 12/10/15  Comprehensive metabolic panel  Result Value Ref Range   Sodium 142 135 - 145 mmol/L   Potassium 3.8 3.5 - 5.1 mmol/L   Chloride 113 (H) 101 - 111 mmol/L   CO2 22 22 - 32 mmol/L   Glucose, Bld 87 65 - 99 mg/dL   BUN 6 6 - 20 mg/dL   Creatinine, Ser 0.64 0.44 - 1.00 mg/dL   Calcium 8.6 (L) 8.9 - 10.3 mg/dL   Total Protein 6.0 (L) 6.5 - 8.1 g/dL   Albumin 3.7 3.5 - 5.0 g/dL   AST 45 (H) 15 - 41 U/L   ALT 46 14 - 54 U/L   Alkaline Phosphatase 75 38 - 126 U/L   Total Bilirubin 0.3 0.3 - 1.2 mg/dL   GFR calc non Af Amer >60 >60 mL/min   GFR calc Af Amer >60 >60 mL/min   Anion gap 7 5 - 15  Ethanol  Result Value Ref Range   Alcohol, Ethyl (B) <5 <5 mg/dL  CBC with Diff  Result Value Ref Range   WBC 4.7 4.0 - 10.5 K/uL   RBC 4.45 3.87 - 5.11 MIL/uL   Hemoglobin 11.4 (L) 12.0 - 15.0 g/dL   HCT 36.6 36.0 - 46.0 %   MCV 82.2 78.0 - 100.0 fL   MCH 25.6 (L) 26.0 - 34.0 pg   MCHC 31.1 30.0 - 36.0 g/dL   RDW 16.5 (H) 11.5 - 15.5 %   Platelets 499 (H) 150 - 400 K/uL   Neutrophils Relative % 60 %   Neutro Abs 2.9 1.7 - 7.7 K/uL   Lymphocytes Relative 24 %   Lymphs Abs 1.2 0.7 - 4.0 K/uL   Monocytes Relative 9 %   Monocytes Absolute 0.4 0.1 - 1.0 K/uL   Eosinophils Relative 6 %   Eosinophils Absolute 0.3 0.0 - 0.7 K/uL   Basophils Relative 1 %   Basophils Absolute 0.0 0.0 - 0.1 K/uL  Urine rapid drug screen (hosp performed)not at [facility]  Result Value Ref Range   Opiates NONE DETECTED NONE DETECTED   Cocaine NONE DETECTED NONE DETECTED   Benzodiazepines NONE DETECTED NONE  DETECTED   Amphetamines NONE DETECTED NONE DETECTED   Tetrahydrocannabinol NONE DETECTED NONE DETECTED   Barbiturates NONE DETECTED NONE DETECTED  Salicylate level  Result Value Ref Range   Salicylate Lvl 123456 2.8 - 30.0 mg/dL  Acetaminophen level  Result Value Ref Range   Acetaminophen (Tylenol), Serum <10 (L) 10 - 30 ug/mL  TSH  Result Value Ref Range   TSH 2.756 0.350 - 4.500 uIU/mL  POC Urine Pregnancy, ED  (not at [facility])  Result Value Ref Range   Preg Test, Ur NEGATIVE NEGATIVE   Ct Head Wo Contrast  Result Date: 12/10/2015 CLINICAL DATA:  Altered mental status, hallucinations and violent behavior. EXAM: CT HEAD WITHOUT CONTRAST TECHNIQUE: Contiguous axial images were obtained from the base of the skull through the vertex without intravenous contrast. COMPARISON:  None. FINDINGS: BRAIN: The ventricles and sulci are normal. No intraparenchymal hemorrhage, mass effect nor midline shift. No acute large vascular territory infarcts. No abnormal extra-axial fluid collections. Basal cisterns are  patent. VASCULAR: Unremarkable. SKULL/SOFT TISSUES: No skull fracture. No significant soft tissue swelling. Tiny suspected sellar pars intermedius cyst. ORBITS/SINUSES: The included ocular globes and orbital contents are normal.The mastoid aircells and included paranasal sinuses are well-aerated. OTHER: None. IMPRESSION: Negative CT HEAD. Electronically Signed   By: Elon Alas M.D.   On: 12/10/2015 20:33   US Transvaginal Non-ob  Result Date: 12/09/2015 CLINICAL DATA:  40 year old female with pelvic pain x2 weeks. Rule out tubo-ovarian abscess. EXAM: TRANSABDOMINAL AND TRANSVAGINAL ULTRASOUND OF PELVIS DOPPLER ULTRASOUND OF OVARIES TECHNIQUE: Both transabdominal and transvaginal ultrasound examinations of the pelvis were performed. Transabdominal technique was performed for global imaging of the pelvis including uterus, ovaries, adnexal regions, and pelvic cul-de-sac. It was necessary to proceed with  endovaginal exam following the transabdominal exam to visualize the abdominal CT dated 12/09/2015. Color and duplex Doppler ultrasound was utilized to evaluate blood flow to the ovaries. COMPARISON:  CT dated 12/09/2015 FINDINGS: Uterus Measurements: 6.8 x 3.7 x 4.7 cm. No fibroids or other mass visualized. Endometrium Thickness: 10 mm.  No focal abnormality visualized. Right ovary Measurements: 3.6 x 1.5 x 1.8 cm. Normal appearance/no adnexal mass. Left ovary Measurements: 5.0 x 2.2 x 3.1 cm. There is a 2.0 x 1.7 x 1.4 cm complex/hemorrhagic cyst or corpus luteum in the left ovary. This structure appears to be within the ovary and not adjacent to the ovary and therefore the possibility of an ectopic pregnancy is less likely. Correlation with HCG levels recommended. There is a 6.3 x 4.5 x 5.4 cm left paraovarian cyst with a single septation. No definite flow identified within the septation. Yearly follow-up with ultrasound is recommended. Pulsed Doppler evaluation of both ovaries demonstrates normal low-resistance arterial and venous waveforms. Other findings No abnormal free fluid. IMPRESSION: Left ovarian hemorrhagic/complex cyst or corpus luteum as well as a large left para ovarian cyst. Annual follow-up with ultrasound is recommended. No sonographic evidence of torsion. Unremarkable uterus and the right ovary. Electronically Signed   By: Anner Crete M.D.   On: 12/09/2015 02:33   US Pelvis Complete  Result Date: 12/09/2015 CLINICAL DATA:  40 year old female with pelvic pain x2 weeks. Rule out tubo-ovarian abscess. EXAM: TRANSABDOMINAL AND TRANSVAGINAL ULTRASOUND OF PELVIS DOPPLER ULTRASOUND OF OVARIES TECHNIQUE: Both transabdominal and transvaginal ultrasound examinations of the pelvis were performed. Transabdominal technique was performed for global imaging of the pelvis including uterus, ovaries, adnexal regions, and pelvic cul-de-sac. It was necessary to proceed with endovaginal exam following the  transabdominal exam to visualize the abdominal CT dated 12/09/2015. Color and duplex Doppler ultrasound was utilized to evaluate blood flow to the ovaries. COMPARISON:  CT dated 12/09/2015 FINDINGS: Uterus Measurements: 6.8 x 3.7 x 4.7 cm. No fibroids or other mass visualized. Endometrium Thickness: 10 mm.  No focal abnormality visualized. Right ovary Measurements: 3.6 x 1.5 x 1.8 cm. Normal appearance/no adnexal mass. Left ovary Measurements: 5.0 x 2.2 x 3.1 cm. There is a 2.0 x 1.7 x 1.4 cm complex/hemorrhagic cyst or corpus luteum in the left ovary. This structure appears to be within the ovary and not adjacent to the ovary and therefore the possibility of an ectopic pregnancy is less likely. Correlation with HCG levels recommended. There is a 6.3 x 4.5 x 5.4 cm left paraovarian cyst with a single septation. No definite flow identified within the septation. Yearly follow-up with ultrasound is recommended. Pulsed Doppler evaluation of both ovaries demonstrates normal low-resistance arterial and venous waveforms. Other findings No abnormal free fluid. IMPRESSION: Left ovarian hemorrhagic/complex cyst  or corpus luteum as well as a large left para ovarian cyst. Annual follow-up with ultrasound is recommended. No sonographic evidence of torsion. Unremarkable uterus and the right ovary. Electronically Signed   By: Anner Crete M.D.   On: 12/09/2015 02:33   Ct Abdomen Pelvis W Contrast  Result Date: 12/09/2015 CLINICAL DATA:  Acute onset of nausea and vomiting. Periumbilical abdominal pain, subacute onset. Initial encounter. EXAM: CT ABDOMEN AND PELVIS WITH CONTRAST TECHNIQUE: Multidetector CT imaging of the abdomen and pelvis was performed using the standard protocol following bolus administration of intravenous contrast. CONTRAST:  100 mL ISOVUE-300 IOPAMIDOL (ISOVUE-300) INJECTION 61% COMPARISON:  CT of the abdomen and pelvis from 09/09/2008 FINDINGS: Lower chest: A 2.2 cm rounded opacity at the left lung base  likely reflects atelectasis, though mild infection might have a similar appearance. Mild bibasilar atelectasis is noted. The visualized portions of the mediastinum are unremarkable. Hepatobiliary: The liver is unremarkable in appearance. There is no evidence for abnormal enhancement of the hepatic capsule to suggest Fitz-Hugh-Curtis syndrome. The gallbladder is grossly unremarkable. The common bile duct remains normal in caliber. Pancreas: The pancreas is grossly unremarkable in appearance. Spleen: The spleen is within normal limits. Adrenals/Urinary Tract: The adrenal glands are grossly unremarkable. The kidneys are within normal limits. There is no evidence of hydronephrosis. No renal or ureteral stones are seen. No perinephric stranding is appreciated. Stomach/Bowel: The stomach is unremarkable in appearance. The small bowel is within normal limits. The appendix is normal in caliber and contains air, without evidence appendicitis. A small metallic density is noted within the mid sigmoid colon. Vascular/Lymphatic: The abdominal aorta is unremarkable in appearance. There is no evidence of calcific atherosclerotic disease. The inferior vena cava is unremarkable in appearance. No retroperitoneal lymphadenopathy is seen. No pelvic sidewall lymphadenopathy is appreciated. Reproductive: The bladder is mildly distended and contains a small amount of contrast. The uterus is grossly unremarkable in appearance. A large mildly complex cystic structure is noted anterior to the uterus, possibly arising from the left ovary, measuring 7.0 cm. Other: No significant soft tissue abnormalities are otherwise seen. Musculoskeletal: There are compression deformities of vertebral bodies T9 through T12, and of L5, most prominent at T9. These appear to be chronic in nature. There appear to be partially healed fractures of the left superior and inferior pubic rami. IMPRESSION: 1. No evidence of abnormal hepatic capsule enhancement to  suggest Fitz-Hugh-Curtis syndrome; would correlate with the patient's vaginal swab results, given clinical concern. 2. **An incidental finding of potential clinical significance has been found. Large 7.0 cm mildly complex cystic structure anterior to the uterus, possibly arising from the left ovary. Pelvic ultrasound would be helpful for further evaluation, when and as deemed clinically appropriate.** 3. Small metallic density incidentally noted within the mid sigmoid colon, likely reflecting an ingested small foreign body. 4. Compression deformities of vertebral bodies T9 through T12, and of L5, most prominent at T9. These appear to be chronic in nature. 5. Partially healed fractures of the left superior and inferior pubic rami. Electronically Signed   By: Garald Balding M.D.   On: 12/09/2015 00:50   [place] Art/ven Flow Abd Pelv Doppler  Result Date: 12/09/2015 CLINICAL DATA:  40 year old female with pelvic pain x2 weeks. Rule out tubo-ovarian abscess. EXAM: TRANSABDOMINAL AND TRANSVAGINAL ULTRASOUND OF PELVIS DOPPLER ULTRASOUND OF OVARIES TECHNIQUE: Both transabdominal and transvaginal ultrasound examinations of the pelvis were performed. Transabdominal technique was performed for global imaging of the pelvis including uterus, ovaries, adnexal regions, and pelvic cul-de-sac.  It was necessary to proceed with endovaginal exam following the transabdominal exam to visualize the abdominal CT dated 12/09/2015. Color and duplex Doppler ultrasound was utilized to evaluate blood flow to the ovaries. COMPARISON:  CT dated 12/09/2015 FINDINGS: Uterus Measurements: 6.8 x 3.7 x 4.7 cm. No fibroids or other mass visualized. Endometrium Thickness: 10 mm.  No focal abnormality visualized. Right ovary Measurements: 3.6 x 1.5 x 1.8 cm. Normal appearance/no adnexal mass. Left ovary Measurements: 5.0 x 2.2 x 3.1 cm. There is a 2.0 x 1.7 x 1.4 cm complex/hemorrhagic cyst or corpus luteum in the left ovary. This structure appears to be  within the ovary and not adjacent to the ovary and therefore the possibility of an ectopic pregnancy is less likely. Correlation with HCG levels recommended. There is a 6.3 x 4.5 x 5.4 cm left paraovarian cyst with a single septation. No definite flow identified within the septation. Yearly follow-up with ultrasound is recommended. Pulsed Doppler evaluation of both ovaries demonstrates normal low-resistance arterial and venous waveforms. Other findings No abnormal free fluid. IMPRESSION: Left ovarian hemorrhagic/complex cyst or corpus luteum as well as a large left para ovarian cyst. Annual follow-up with ultrasound is recommended. No sonographic evidence of torsion. Unremarkable uterus and the right ovary. Electronically Signed   By: Anner Crete M.D.   On: 12/09/2015 02:33  Patient is medically clear for psychiatric evaluation   Kathy Dakin, MD 12/10/15 2320
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
## 2015-12-10 NOTE — ED Notes (Signed)
Pt returned from radiology.

## 2015-12-11 DIAGNOSIS — R45851 Suicidal ideations: Secondary | ICD-10-CM

## 2015-12-11 DIAGNOSIS — F323 Major depressive disorder, single episode, severe with psychotic features: Secondary | ICD-10-CM | POA: Diagnosis present

## 2015-12-11 DIAGNOSIS — D649 Anemia, unspecified: Secondary | ICD-10-CM

## 2015-12-11 LAB — URINALYSIS, ROUTINE W REFLEX MICROSCOPIC
Bilirubin Urine: NEGATIVE
Glucose, UA: NEGATIVE mg/dL
Ketones, ur: NEGATIVE mg/dL
Nitrite: NEGATIVE
Protein, ur: NEGATIVE mg/dL
Specific Gravity, Urine: 1.015 (ref 1.005–1.030)
pH: 5.5 (ref 5.0–8.0)

## 2015-12-11 LAB — URINE MICROSCOPIC-ADD ON

## 2015-12-11 MED ORDER — DULOXETINE HCL 20 MG PO CPEP
20.0000 mg | ORAL_CAPSULE | Freq: Every day | ORAL | Status: DC
  Administered 2015-12-11 – 2015-12-12 (×2): 20 mg via ORAL
  Filled 2015-12-11 (×2): qty 1

## 2015-12-11 MED ORDER — RISPERIDONE 0.5 MG PO TABS
0.5000 mg | ORAL_TABLET | Freq: Two times a day (BID) | ORAL | Status: DC
  Administered 2015-12-11 – 2015-12-12 (×3): 0.5 mg via ORAL
  Filled 2015-12-11 (×3): qty 1

## 2015-12-11 NOTE — ED Notes (Signed)
Pt's brother shared information about pt. Her doctor is Dr. Maeola Harman and [facility] [place]. [phone]. He said that she has a history of anorexia and anemia and sometimes requires blood transfusion.

## 2015-12-11 NOTE — BH Assessment (Signed)
Kathy Rosales, [facility] at [facility], confirms adult unit is at capacity. Faxed clinical information to the following facilities for placement:  [facility]   [address] [place], [place], [facility], [facility] Triage Specialist [phone]

## 2015-12-11 NOTE — ED Notes (Signed)
SBAR Report received from previous nurse. Pt received calm and visible on unit. Pt denies current SI/ HI, A/V H, depression, anxiety, and pain at this time, and is otherwise stable. Pt reminded of camera surveillance, q 15 min rounds, and rules of the milieu. Will continue to assess. 

## 2015-12-11 NOTE — ED Notes (Signed)
Patient presently resting comfortably. Respirations even and non-labored. Medication effective at this time.

## 2015-12-11 NOTE — Consult Note (Signed)
[place] Psychiatry Consult   Reason for Consult:  Psychosis Referring Physician: EDP Patient Identification: Kathy Rosales MRN:  [id number] Principal Diagnosis: Major depressive disorder, single episode, severe with psychotic features [facility]) Diagnosis:   Patient Active Problem List   Diagnosis Date Noted  . Major depressive disorder, single episode, severe with psychotic features ([place]) [F32.3] 12/11/2015    Priority: High    Total Time spent with patient: 45 minutes  Subjective:   Kathy Rosales is a 40 y.o. female patient who warrants inpatient stay at this time.   HPI: Kathy Rosales is a 40 year old female who presents to the emergency room with complaints of pain as well as hearing voices telling her to "run and hide", and "run into traffic" and to hurt herself. Pt states she does feel like acting on these commands when the voices occur. Pt is noted to be disheveled and malodorous, and with little eye contact on assessment. Pt states the voices are both female and female voices. Pt with no prior history of depression or inpatient hospitalizations prior to this visit. Pt also states "My pain got so bad, I couldn't take it anymore". 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 Pt endorses depression with a dull, flat affect. Pt remains calm and cooperative at this time.  Past Psychiatric History: No past psychiatric history or inpatient visits.  Risk to Self: Suicidal Ideation: No Suicidal Intent: No Is patient at risk for suicide?: No Suicidal Plan?: No Access to Means: No What has been your use of drugs/alcohol within the last 12 months?: denies How many times?: 0 Other Self Harm Risks: none Triggers for Past Attempts: Unknown Intentional Self Injurious Behavior: None Risk to Others: Homicidal Ideation: No Thoughts of Harm to Others: No Current Homicidal  Intent: No Current Homicidal Plan: No Access to Homicidal Means: No Identified Victim: na History of harm to others?: No Assessment of Violence: None Noted Violent Behavior Description: na Does patient have access to weapons?: No Criminal Charges Pending?: No Does patient have a court date: No Prior Inpatient Therapy: Prior Inpatient Therapy: No Prior Therapy Dates: na Prior Therapy Facilty/Provider(s): na Reason for Treatment: na Prior Outpatient Therapy: Prior Outpatient Therapy: No Prior Therapy Dates: na Prior Therapy Facilty/Provider(s): na Reason for Treatment: na Does patient have an ACCT team?: No Does patient have Intensive In-House Services?  : No Does patient have [facility] services? : No Does patient have P4CC services?: No  Past Medical History: History reviewed. No pertinent past medical history. History reviewed. No pertinent surgical history. Family History: No family history on file. Family Psychiatric  History: none Social History:  History  Alcohol Use No     History  Drug use: Unknown    Social History   Social History  . Marital status: Married    Spouse name: N/A  . Number of children: N/A  . Years of education: N/A   Social History Main Topics  . Smoking status: Never Smoker  . Smokeless tobacco: None  . Alcohol use No  . Drug use: Unknown  . Sexual activity: Not Asked   Other Topics Concern  . None   Social History Narrative  . None   Additional Social History:    Allergies:  No Known Allergies  Labs:  Results for orders placed or performed during the hospital encounter of 12/10/15 (from the past 48 hour(s))  Urine  rapid drug screen (hosp performed)not at [facility]     Status: None   Collection Time: 12/10/15  6:04 PM  Result Value Ref Range   Opiates NONE DETECTED NONE DETECTED   Cocaine NONE DETECTED NONE DETECTED   Benzodiazepines NONE DETECTED NONE DETECTED   Amphetamines NONE DETECTED NONE DETECTED   Tetrahydrocannabinol NONE  DETECTED NONE DETECTED   Barbiturates NONE DETECTED NONE DETECTED    Comment:        DRUG SCREEN FOR MEDICAL PURPOSES ONLY.  IF CONFIRMATION IS NEEDED FOR ANY PURPOSE, NOTIFY LAB WITHIN 5 DAYS.        LOWEST DETECTABLE LIMITS FOR URINE DRUG SCREEN Drug Class       Cutoff (ng/mL) Amphetamine      1000 Barbiturate      200 Benzodiazepine   341 Tricyclics       962 Opiates          300 Cocaine          300 THC              50   POC Urine Pregnancy, ED  (not at [facility])     Status: None   Collection Time: 12/10/15  6:32 PM  Result Value Ref Range   Preg Test, Ur NEGATIVE NEGATIVE    Comment:        THE SENSITIVITY OF THIS METHODOLOGY IS >24 mIU/mL   Comprehensive metabolic panel     Status: Abnormal   Collection Time: 12/10/15  6:51 PM  Result Value Ref Range   Sodium 142 135 - 145 mmol/L   Potassium 3.8 3.5 - 5.1 mmol/L   Chloride 113 (H) 101 - 111 mmol/L   CO2 22 22 - 32 mmol/L   Glucose, Bld 87 65 - 99 mg/dL   BUN 6 6 - 20 mg/dL   Creatinine, Ser 0.64 0.44 - 1.00 mg/dL   Calcium 8.6 (L) 8.9 - 10.3 mg/dL   Total Protein 6.0 (L) 6.5 - 8.1 g/dL   Albumin 3.7 3.5 - 5.0 g/dL   AST 45 (H) 15 - 41 U/L   ALT 46 14 - 54 U/L   Alkaline Phosphatase 75 38 - 126 U/L   Total Bilirubin 0.3 0.3 - 1.2 mg/dL   GFR calc non Af Amer >60 >60 mL/min   GFR calc Af Amer >60 >60 mL/min    Comment: (NOTE) The eGFR has been calculated using the CKD EPI equation. This calculation has not been validated in all clinical situations. eGFR's persistently <60 mL/min signify possible Chronic Kidney Disease.    Anion gap 7 5 - 15  CBC with Diff     Status: Abnormal   Collection Time: 12/10/15  6:51 PM  Result Value Ref Range   WBC 4.7 4.0 - 10.5 K/uL   RBC 4.45 3.87 - 5.11 MIL/uL   Hemoglobin 11.4 (L) 12.0 - 15.0 g/dL   HCT 36.6 36.0 - 46.0 %   MCV 82.2 78.0 - 100.0 fL   MCH 25.6 (L) 26.0 - 34.0 pg   MCHC 31.1 30.0 - 36.0 g/dL   RDW 16.5 (H) 11.5 - 15.5 %   Platelets 499 (H) 150 - 400 K/uL    Neutrophils Relative % 60 %   Neutro Abs 2.9 1.7 - 7.7 K/uL   Lymphocytes Relative 24 %   Lymphs Abs 1.2 0.7 - 4.0 K/uL   Monocytes Relative 9 %   Monocytes Absolute 0.4 0.1 - 1.0 K/uL   Eosinophils Relative 6 %  Eosinophils Absolute 0.3 0.0 - 0.7 K/uL   Basophils Relative 1 %   Basophils Absolute 0.0 0.0 - 0.1 K/uL  Ethanol     Status: None   Collection Time: 12/10/15  6:52 PM  Result Value Ref Range   Alcohol, Ethyl (B) <5 <5 mg/dL    Comment:        LOWEST DETECTABLE LIMIT FOR SERUM ALCOHOL IS 5 mg/dL FOR MEDICAL PURPOSES ONLY   Salicylate level     Status: None   Collection Time: 12/10/15  6:52 PM  Result Value Ref Range   Salicylate Lvl <5.1 2.8 - 30.0 mg/dL  Acetaminophen level     Status: Abnormal   Collection Time: 12/10/15  6:52 PM  Result Value Ref Range   Acetaminophen (Tylenol), Serum <10 (L) 10 - 30 ug/mL    Comment:        THERAPEUTIC CONCENTRATIONS VARY SIGNIFICANTLY. A RANGE OF 10-30 ug/mL MAY BE AN EFFECTIVE CONCENTRATION FOR MANY PATIENTS. HOWEVER, SOME ARE BEST TREATED AT CONCENTRATIONS OUTSIDE THIS RANGE. ACETAMINOPHEN CONCENTRATIONS >150 ug/mL AT 4 HOURS AFTER INGESTION AND >50 ug/mL AT 12 HOURS AFTER INGESTION ARE OFTEN ASSOCIATED WITH TOXIC REACTIONS.   TSH     Status: None   Collection Time: 12/10/15  6:52 PM  Result Value Ref Range   TSH 2.756 0.350 - 4.500 uIU/mL    Current Facility-Administered Medications  Medication Dose Route Frequency Provider Last Rate Last Dose  . promethazine (PHENERGAN) tablet 25 mg  25 mg Oral Q6H PRN Gloriann Loan, PA-C      . traMADol Veatrice Bourbon) tablet 150 mg  150 mg Oral Q3H PRN Gloriann Loan, PA-C   150 mg at 12/11/15 0141   Current Outpatient Prescriptions  Medication Sig Dispense Refill  . HYDROcodone-acetaminophen (NORCO/VICODIN) 5-325 MG tablet Take 1 tablet by mouth every 4 (four) hours as needed for moderate pain. 10 tablet 0  . ibuprofen (ADVIL,MOTRIN) 800 MG tablet Take 1 tablet (800 mg total) by mouth 3  (three) times daily. 21 tablet 0  . promethazine (PHENERGAN) 25 MG tablet Take 1 tablet (25 mg total) by mouth every 6 (six) hours as needed for nausea or vomiting. 30 tablet 0  . traMADol (ULTRAM) 50 MG tablet Take 150 mg by mouth every 3 (three) hours as needed for moderate pain.      Musculoskeletal: Strength & Muscle Tone: within normal limits Gait & Station: normal Patient leans: N/A  Psychiatric Specialty Exam: Physical Exam  Constitutional: She is oriented to person, place, and time. She appears well-developed and well-nourished.  HENT:  Head: Normocephalic.  Neck: Normal range of motion.  Respiratory: Effort normal.  Musculoskeletal: Normal range of motion.  Neurological: She is alert and oriented to person, place, and time.  Skin: Skin is warm and dry.  Psychiatric: Her speech is normal. Judgment normal. Her affect is blunt. She is actively hallucinating. Cognition and memory are normal. She exhibits a depressed mood. She expresses suicidal ideation. She expresses suicidal plans.    Review of Systems  Constitutional: Negative.   HENT: Negative.   Eyes: Negative.   Respiratory: Negative.   Cardiovascular: Negative.   Gastrointestinal: Negative.   Genitourinary: Negative.   Musculoskeletal: Negative.   Skin: Negative.   Neurological: Negative.   Endo/Heme/Allergies: Negative.   Psychiatric/Behavioral: Positive for depression and suicidal ideas.    Blood pressure (!) 99/52, pulse 76, temperature 98 F (36.7 C), temperature source Oral, resp. rate 17, last menstrual period 11/17/2015, SpO2 97 %.There is no height or  weight on file to calculate BMI.  General Appearance: Disheveled  Eye Contact:  Minimal  Speech:  Clear and Coherent and Normal Rate  Volume:  Decreased  Mood:  Depressed  Affect:  Depressed and Flat  Thought Process:  Coherent  Orientation:  Full (Time, Place, and Person)  Thought Content:  Hallucinations: Auditory Command:  voices telling her to jump  into traffic  Suicidal Thoughts: Yes  Homicidal Thoughts:  No  Memory:  Immediate;   Good Recent;   Good Remote;   Good  Judgement:  Impaired  Insight:  Fair  Psychomotor Activity:  Normal  Concentration:  Concentration: Good  Recall:  Good  Fund of Knowledge:  Fair  Language:  Good  Akathisia:  No  Handed:  Right  AIMS (if indicated):     Assets:  Wellsite geologist  ADL's:  Intact  Cognition:  WNL  Sleep:        Treatment Plan Summary: Daily contact with patient to assess and evaluate symptoms and progress in treatment, Medication management and Plan inpatient admission:  Major depressive disorder, recurrent, severe with psychotic features: -Crisis stabilization -Medication management:  Medical medications restarted, started Cymbalta 20 mg daily for depression and Risperdal 0.5 mg BID for psychosis. -Individual counseling  Disposition: Recommend psychiatric Inpatient admission when medically cleared.  Waylan Boga, NP 12/11/2015 10:59 AM

## 2015-12-11 NOTE — ED Notes (Signed)
Pt's family visited, taking turns, during the morning.  Pt appeared to become increasingly distressed: She hid her head under the covers and under her arms; she would not respond verbally to questions. Her brother frequently reported her symptoms to staff and showed intense concern. Because they had taken turns visiting for several hours, this writer asked family to allow her to rest alone until later in the evening. After her brother left she crawled under the countertop in the room and would not come out. Staff kept a close eye on her and she soon got back into bed. When doing her VS (which she allowed), this writer asked if she preferred to have visitors or to be left alone to rest some, and she said that she prefers to be alone. Her behavior is now more calm and less anxious or regressed. She had complained of chronic pain and medication was given for that, which helped.

## 2015-12-11 NOTE — ED Notes (Signed)
Today pt denies SI/HI. She said that she has not heard voices since last night. She does not appear to be responding to internal stimuli. This morning she has stayed in bed and is slow to respond, but responds appropriately.

## 2015-12-12 ENCOUNTER — Inpatient Hospital Stay (HOSPITAL_COMMUNITY)
Admission: AD | Admit: 2015-12-12 | Discharge: 2015-12-15 | DRG: 885 | Disposition: A | Discharge status: Home / Self Care | Payer: Federal, State, Local not specified - Other | Source: Intra-hospital | Attending: Psychiatry | Admitting: Psychiatry

## 2015-12-12 ENCOUNTER — Encounter (HOSPITAL_COMMUNITY): Payer: Self-pay | Admitting: *Deleted

## 2015-12-12 DIAGNOSIS — F419 Anxiety disorder, unspecified: Secondary | ICD-10-CM | POA: Diagnosis present

## 2015-12-12 DIAGNOSIS — G4700 Insomnia, unspecified: Secondary | ICD-10-CM | POA: Diagnosis present

## 2015-12-12 DIAGNOSIS — F1110 Opioid abuse, uncomplicated: Secondary | ICD-10-CM | POA: Clinically undetermined

## 2015-12-12 DIAGNOSIS — D509 Iron deficiency anemia, unspecified: Secondary | ICD-10-CM | POA: Diagnosis present

## 2015-12-12 DIAGNOSIS — F321 Major depressive disorder, single episode, moderate: Secondary | ICD-10-CM | POA: Diagnosis present

## 2015-12-12 DIAGNOSIS — F05 Delirium due to known physiological condition: Secondary | ICD-10-CM | POA: Diagnosis present

## 2015-12-12 DIAGNOSIS — F411 Generalized anxiety disorder: Secondary | ICD-10-CM | POA: Diagnosis present

## 2015-12-12 DIAGNOSIS — N390 Urinary tract infection, site not specified: Secondary | ICD-10-CM | POA: Diagnosis present

## 2015-12-12 DIAGNOSIS — M0820 Juvenile rheumatoid arthritis with systemic onset, unspecified site: Secondary | ICD-10-CM | POA: Diagnosis present

## 2015-12-12 DIAGNOSIS — Z23 Encounter for immunization: Secondary | ICD-10-CM

## 2015-12-12 DIAGNOSIS — F333 Major depressive disorder, recurrent, severe with psychotic symptoms: Secondary | ICD-10-CM | POA: Diagnosis present

## 2015-12-12 MED ORDER — TRAZODONE HCL 50 MG PO TABS
50.0000 mg | ORAL_TABLET | Freq: Every evening | ORAL | Status: DC | PRN
  Administered 2015-12-12 – 2015-12-13 (×2): 50 mg via ORAL
  Filled 2015-12-12 (×2): qty 1

## 2015-12-12 MED ORDER — ACETAMINOPHEN 325 MG PO TABS
650.0000 mg | ORAL_TABLET | Freq: Four times a day (QID) | ORAL | Status: DC | PRN
  Administered 2015-12-13 – 2015-12-15 (×4): 650 mg via ORAL
  Filled 2015-12-12 (×4): qty 2

## 2015-12-12 MED ORDER — DULOXETINE HCL 20 MG PO CPEP
20.0000 mg | ORAL_CAPSULE | Freq: Every day | ORAL | Status: DC
  Administered 2015-12-13: 20 mg via ORAL
  Filled 2015-12-12 (×4): qty 1

## 2015-12-12 MED ORDER — ENSURE ENLIVE PO LIQD
237.0000 mL | Freq: Two times a day (BID) | ORAL | Status: DC
  Administered 2015-12-13 – 2015-12-15 (×5): 237 mL via ORAL

## 2015-12-12 MED ORDER — ALUM & MAG HYDROXIDE-SIMETH 200-200-20 MG/5ML PO SUSP: 30.0000 mL | ORAL | Status: DC | PRN

## 2015-12-12 MED ORDER — HYDROXYZINE HCL 25 MG PO TABS
25.0000 mg | ORAL_TABLET | Freq: Four times a day (QID) | ORAL | Status: DC | PRN
  Administered 2015-12-13 – 2015-12-14 (×2): 25 mg via ORAL
  Filled 2015-12-12: qty 10
  Filled 2015-12-12 (×2): qty 1

## 2015-12-12 MED ORDER — RISPERIDONE 0.5 MG PO TABS
0.5000 mg | ORAL_TABLET | Freq: Two times a day (BID) | ORAL | Status: DC
  Administered 2015-12-12 – 2015-12-14 (×4): 0.5 mg via ORAL
  Filled 2015-12-12 (×8): qty 1

## 2015-12-12 MED ORDER — MAGNESIUM HYDROXIDE 400 MG/5ML PO SUSP: 30.0000 mL | Freq: Every day | ORAL | Status: DC | PRN

## 2015-12-12 NOTE — Progress Notes (Signed)
Patient has been isolative to her room tonight and had family to visit her earlier. She request apples to eat and received them. She attended group and participated.she was compliant with her hs medication and requested sleep aid which she received. She denied si/hi/a/v hallucinations but seems preoccupied in her thought process. Support given and safety maintained. She reports that she would be interested in outpatient counseling. Safety maintained on unit with 15 min checks.

## 2015-12-12 NOTE — Progress Notes (Signed)
Disposition CSW completed additional patient referrals to the following inpatient psych facilities:  [facility] will continue to follow patient for placement needs.  [place] Disposition CSW [phone]

## 2015-12-12 NOTE — Tx Team (Signed)
Initial Treatment Plan 12/12/2015 5:13 PM [name] [id number]    PATIENT STRESSORS: Health problems Marital or family conflict Medication change or noncompliance   PATIENT STRENGTHS: Capable of independent living Motivation for treatment/growth Supportive family/friends   PATIENT IDENTIFIED PROBLEMS: " I am anxious"    "The voices come in and out of my head"    "The voices told me to run in traffic"             DISCHARGE CRITERIA:  Ability to meet basic life and health needs Adequate post-discharge living arrangements Improved stabilization in mood, thinking, and/or behavior  PRELIMINARY DISCHARGE PLAN: Outpatient therapy Return to previous living arrangement  PATIENT/FAMILY INVOLVEMENT: This treatment plan has been presented to and reviewed with the patient, Kathy Rosales, has been given the opportunity to ask questions and make suggestions.  Debbrah Alar, RN 12/12/2015, 5:13 PM

## 2015-12-12 NOTE — ED Provider Notes (Signed)
Pt has been accepted for psychiatric placement.  She is stable for transfer.  EMTALA form filled out.   Isla Pence, MD 12/12/15 [phone]

## 2015-12-12 NOTE — BH Assessment (Signed)
RN Maudie Mercury is requesting admission orders. Per Joselyn Arrow patient was accepted by NP Lord.

## 2015-12-12 NOTE — Progress Notes (Signed)
[facility] Group Notes:  (Nursing/MHT/Case Management/Adjunct)  Date:  12/12/2015  Time:  10:05 PM  Type of Therapy:  Psychoeducational Skills  Participation Level:  Minimal  Participation Quality:  Attentive  Affect:  Flat  Cognitive:  Lacking  Insight:  Lacking  Engagement in Group:  Lacking  Modes of Intervention:  Education  Summary of Progress/Problems: The patient described her day as having been okay but did not go into further detail.   Emric Kowalewski S 12/12/2015, 10:05 PM

## 2015-12-12 NOTE — Progress Notes (Signed)
Unable to locate orders for patient. Dawnaly RN reported that there was no provider in the [facility].

## 2015-12-12 NOTE — ED Notes (Signed)
Patient transferred to [facility].  She has no belongings with her as they all went home with her family.  Report was called to Maudie Mercury, Therapist, sports.  Patient left the unit ambulatory with [employer].

## 2015-12-12 NOTE — Progress Notes (Signed)
Patient ID: Kathy Rosales, female   DOB: Nov 14, 1975, 40 y.o.   MRN: [id number] Admission note:  Patient is a 40 yo female voluntarily admitted to [facility] for auditory hallucinations and suicidal ideation.  Patient is hispanic and her primary language is [place].  An interpreter was used during admission.  Patient states that she was prescribed tramadol for her "autoimmune disorder."  Patient states she was taking more than was prescribed and when she read the side effects, she became anxious and depressed.  Patient started having auditory hallucinations telling her "to run out into the road in front of a car."  Patient has no prior mental health admissions.  She denies any alcohol or drug use.  Patient lives with her sister and 22 yo daughter.  Patient was worried that her tramadol would prevent her from "taking care of my daughter."  Patient presents with severe anxiety.  Her mood is sad and depressed.  She has lost 24 lbs within 3 weeks.  Patient denies any physical pain.  Patient did have a husband present during her initial assessment.  He is listed at [place].  Patient has hx of Still's Disease which is an autoimmune disease.  She has a hx of iron deficiency, B12 deficiency and gastritis.  Patient was oriented to room and unit.  She is calm and cooperative.

## 2015-12-12 NOTE — Progress Notes (Signed)
Re-Assessment:   The patient was sleep but easily aroused and willing to participate with this assessment.  Patient reports severe pain in her entire body and fatigue.  Patient reports auditory hallucinations constantly and she is unable to understand what they are saying.  Patient denies current SI but will become suicidal once she is overwhelmed by the auditory hallucinations.  Patient reports the only way to decrease the hallucinations is to sleep.       Chesley Noon, MSW, Darlyn Read [facility] Triage Specialist [phone]

## 2015-12-13 ENCOUNTER — Ambulatory Visit: Payer: No Typology Code available for payment source | Admitting: Family Medicine

## 2015-12-13 ENCOUNTER — Ambulatory Visit: Payer: Self-pay | Admitting: Family Medicine

## 2015-12-13 ENCOUNTER — Telehealth: Payer: Self-pay

## 2015-12-13 ENCOUNTER — Encounter (HOSPITAL_COMMUNITY): Payer: Self-pay | Admitting: Psychiatry

## 2015-12-13 DIAGNOSIS — F1110 Opioid abuse, uncomplicated: Secondary | ICD-10-CM | POA: Clinically undetermined

## 2015-12-13 DIAGNOSIS — N390 Urinary tract infection, site not specified: Secondary | ICD-10-CM | POA: Clinically undetermined

## 2015-12-13 DIAGNOSIS — F05 Delirium due to known physiological condition: Secondary | ICD-10-CM | POA: Clinically undetermined

## 2015-12-13 DIAGNOSIS — F321 Major depressive disorder, single episode, moderate: Secondary | ICD-10-CM | POA: Clinically undetermined

## 2015-12-13 MED ORDER — OLANZAPINE 10 MG IM SOLR: 5.0000 mg | Freq: Three times a day (TID) | INTRAMUSCULAR | Status: DC | PRN

## 2015-12-13 MED ORDER — SULFAMETHOXAZOLE-TRIMETHOPRIM 800-160 MG PO TABS
1.0000 | ORAL_TABLET | Freq: Two times a day (BID) | ORAL | Status: DC
  Administered 2015-12-13 – 2015-12-15 (×5): 1 via ORAL
  Filled 2015-12-13 (×6): qty 1
  Filled 2015-12-13: qty 9
  Filled 2015-12-13 (×3): qty 1

## 2015-12-13 MED ORDER — OLANZAPINE 5 MG PO TABS
5.0000 mg | ORAL_TABLET | Freq: Three times a day (TID) | ORAL | Status: DC | PRN
Start: 2015-12-13 — End: 2015-12-15
  Administered 2015-12-13: 5 mg via ORAL
  Filled 2015-12-13: qty 2

## 2015-12-13 NOTE — Telephone Encounter (Signed)
Called and spoke with the [facility] to make sure patient was still admitted. They could not give [place] that information, but could take a message and see if patient was still there. Left message asking to cancel her appointment for today at 2:30 so she wouldn't be charged a fee.

## 2015-12-13 NOTE — Progress Notes (Signed)
Recreation Therapy Notes  Date: 12/13/15 Time: 1000 Location: 500 Hall Dayroom  Group Topic: Communication  Goal Area(s) Addresses:  Patient will effectively communicate with peers in group.  Patient will verbalize benefit of healthy communication. Patient will verbalize positive effect of healthy communication on post d/c goals.  Patient will identify communication techniques that made activity effective for group.   Behavioral Response: Engaged  Intervention:  Dry erase board, eraser, dry erase marker, strips of paper with random words   Activity: [place].  LRT are divided the group into two teams.  Each person would get a turn.  Each person would draw a strip of paer from the container.  The person has to draw whatever is on the paper,  on the board.  While they are drawing, their team is trying to guess what it is they are drawing.  If the team guesses the picture they get a point.     Education:Communication, Discharge Planning  Education Outcome: Acknowledges understanding/In group clarification offered/Needs additional education.   Clinical Observations/Feedback: Pt was engaged and active during group.  Pt peers were very encouraging to her.  Pt was also actively trying to guess the pictures of her teammates.  Pt left early with doctor and did not return.   Victorino Sparrow, LRT/CTRS        Victorino Sparrow A 12/13/2015 12:01 PM

## 2015-12-13 NOTE — Progress Notes (Signed)
NUTRITION ASSESSMENT  Pt identified as at risk on the Malnutrition Screen Tool  INTERVENTION: 1. Educated patient on the importance of nutrition and encouraged intake of food and beverages. 2. Discussed weight goals. 3. Supplements: continue Ensure Enlive po BID, each supplement provides 350 kcal and 20 grams of protein   NUTRITION DIAGNOSIS: Unintentional weight loss related to sub-optimal intake as evidenced by pt report.   Goal: Pt to meet >/= 90% of their estimated nutrition needs.  Monitor:  PO intake  Assessment:  Pt admitted for SI and auditory hallucinations. She states that she has had a poor appetite for the past 2 weeks with decrease in PO intake. She reports that in the past 2-3 weeks she has lost ~25 lbs. Based on CBW, this indicates ~17% body weight in this time frame which is significant.  Ensure Enlive already ordered BID. Continue to encourage pt with meals, snacks, and supplements.  40 y.o. female  Height: Ht Readings from Last 1 Encounters:  12/12/15 5' 0.24" (1.53 m)    Weight: Wt Readings from Last 1 Encounters:  12/12/15 122 lb (55.3 kg)    Weight Hx: Wt Readings from Last 10 Encounters:  12/12/15 122 lb (55.3 kg)    BMI:  Body mass index is 23.64 kg/m. Pt meets criteria for normal weight based on current BMI.  Estimated Nutritional Needs: Kcal: 25-30 kcal/kg Protein: > 1 gram protein/kg Fluid: 1 ml/kcal  Diet Order: Diet regular Room service appropriate? Yes; Fluid consistency: Thin Pt is also offered choice of unit snacks mid-morning and mid-afternoon.  Pt is eating as desired.   Lab results and medications reviewed.     Jarome Matin, MS, RD, LDN Inpatient Clinical Dietitian Pager # [phone] After hours/weekend pager # [phone]

## 2015-12-13 NOTE — BHH Suicide Risk Assessment (Signed)
[facility] Admission Suicide Risk Assessment   Nursing information obtained from:  Patient Demographic factors:  Low socioeconomic status Current Mental Status:  Suicidal ideation indicated by patient Loss Factors:  Decline in physical health Historical Factors:  NA Risk Reduction Factors:  Responsible for children under 40 years of age, Sense of responsibility to family  Total Time spent with patient: 30 minutes Principal Problem: Delirium due to multiple etiologies, persistent, mixed level of activity Diagnosis:   Patient Active Problem List   Diagnosis Date Noted  . 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 MDD (major depressive disorder), single episode, moderate ([place]) [F32.1] 12/13/2015  . Opioid use disorder, mild, abuse [F11.10] 12/13/2015   Subjective Data: Please see H&P.   Continued Clinical Symptoms:  Alcohol Use Disorder Identification Test Final Score (AUDIT): 0 The "Alcohol Use Disorders Identification Test", Guidelines for Use in Primary Care, Second Edition.  World Pharmacologist [facility]). Score between 0-7:  no or low risk or alcohol related problems. Score between 8-15:  moderate risk of alcohol related problems. Score between 16-19:  high risk of alcohol related problems. Score 20 or above:  warrants further diagnostic evaluation for alcohol dependence and treatment.   CLINICAL FACTORS:   Alcohol/Substance Abuse/Dependencies Medical Diagnoses and Treatments/Surgeries   Musculoskeletal: Strength & Muscle Tone: within normal limits Gait & Station: normal Patient leans: N/A  Psychiatric Specialty Exam: Physical Exam  ROS  Blood pressure 113/72, pulse 96, temperature 98.5 F (36.9 C), temperature source Oral, resp. rate 16, height 5' 0.24" (1.53 m), weight 55.3 kg (122 lb), last menstrual period 11/17/2015.Body mass index is 23.64 kg/m.        Please see H&P.                                                    COGNITIVE FEATURES THAT CONTRIBUTE TO RISK:  Closed-mindedness, Polarized thinking and Thought constriction (tunnel vision)    SUICIDE RISK:   Moderate:  Frequent suicidal ideation with limited intensity, and duration, some specificity in terms of plans, no associated intent, good self-control, limited dysphoria/symptomatology, some risk factors present, and identifiable protective factors, including available and accessible social support.   PLAN OF CARE: Please see H&P.   I certify that inpatient services furnished can reasonably be expected to improve the patient's condition.  Kewan Mcnease, MD 12/13/2015, 11:50 AM

## 2015-12-13 NOTE — Progress Notes (Addendum)
D-pt c/o generalized body aches; language barrier, interpreter provided today and is also coming back tonight A-pt took her medications and attended group R-cont to monitor for safety

## 2015-12-13 NOTE — Plan of Care (Signed)
Problem: Medication: Goal: Compliance with prescribed medication regimen will improve Outcome: Progressing Patient was compliant with scheduled hs medication.

## 2015-12-13 NOTE — Progress Notes (Signed)
[facility] Group Notes:  (Nursing/MHT/Case Management/Adjunct)  Date:  12/13/2015  Time:  9:52 PM  Type of Therapy:  Psychoeducational Skills  Participation Level:  Active  Participation Quality:  Appropriate  Affect:  Appropriate  Cognitive:  Appropriate  Insight:  Good  Engagement in Group:  Engaged  Modes of Intervention:  Education  Summary of Progress/Problems: The patient stated that she had a very difficult day. She states that she was upset about not receiving her medication when she requested it. The patient was pleased to have been visited by her parents this evening. Her goal for tomorrow is to get discharged.   Archie Balboa S 12/13/2015, 9:52 PM

## 2015-12-13 NOTE — H&P (Signed)
Psychiatric Admission Assessment Adult  Patient Identification: Kathy Rosales MRN:  [id number] Date of Evaluation:  12/13/2015 Chief Complaint: Patient states " I am hearing voices asking me to run and also kill myself."   Principal Diagnosis: Delirium due to multiple etiologies, persistent, mixed level of activity( tramadol abuse, UTI )  Diagnosis:   Patient Active Problem List   Diagnosis Date Noted  . 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 MDD (major depressive disorder), single episode, moderate ([facility] [place]) [F32.1] 12/13/2015  . Opioid use disorder, mild, abuse [F11.10] 12/13/2015  . UTI (urinary tract infection) [N39.0] 12/13/2015   History of Present Illness: Kathy Rosales is a 40 y.o. 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 Patient is oriented to place stating "hospital" when asked but finds it difficult to answer other questions in reference to the assessment. Husband Zebedee Iba [phone] who is present, stated his wife has "Still's Disease" and has never had any MH issues until today when she reported that she heard voices to "run in front of a car." Collateral from husband states patient has never had any prior attempts/gestures at self harm or ever suffering from any mental health issues. Husband states patient has never been on any mental health medications or displayed any behaviors associated with AH until today.Patient presents with a very fearful affect and is incoherent at times not understanding this writer's questions. Collateral  information from husband stated patient's sister was at their residence this date and observed patient acting "very strange like she was hearing something" and would not respond to her questions. Patient became agitated and eventually stated voices were "telling her to run, run out into the cars." Admission note states: "Pt reports command hallucination telling her to "get hit by a car." Pt is shaking, states she is cold-warm blanket given to pt. Pt is alert and oriented to person only. "   Patient seen and chart reviewed today .Discussed patient with treatment team. Patient today was evaluated with help of [place] interpreter. Pt reports that she was in her normal state of health unit a few days ago , when she started having sadness, anxiety sx, sleep issues and Command AH asking her to run and hide as well as hurt self . Pt reports she was on Tramadol for her pain , but she was abusing it by using more than what was prescribed. Per pt she stopped using tramadol abruptly and all of a sudden started having anxiety sx, sleep issues and paranoia and AH as described above. Pt reports she is hearing command AH asking her to hurt self , but her paranoia and agitation is improving since she started taking medications. Pt denies past hx of mental illness, denies hx of depression, anxiety or hx of abuse. Pt denies abusing any other substances.   Associated Signs/Symptoms: Depression Symptoms:  depressed mood, insomnia, psychomotor agitation, fatigue, feelings of worthlessness/guilt, difficulty concentrating, hopelessness, anxiety, (Hypo) Manic Symptoms:  Hallucinations, Impulsivity, Labiality of Mood, Anxiety Symptoms:  Panic Symptoms, Psychotic Symptoms:  Delusions, Hallucinations: Auditory  Command:  run, hide, kill self. Paranoia, PTSD Symptoms: Negative Total Time spent with patient: 45 minutes  Past Psychiatric History: Denies hx of mental illness or suicide attempts.  Is the patient  at risk to self? Yes.    Has the patient been a risk to self in the past 6 months? No.  Has the patient been a risk to self within the distant past? No.  Is the patient a risk to others? Yes.    Has the patient been a risk to others in the past 6 months? No.  Has the patient been a risk to others within the distant past? No.   Prior Inpatient Therapy:  denies Prior Outpatient Therapy:  denies  Alcohol Screening: 1. How often do you have a drink containing alcohol?: Never 9. Have you or someone else been injured as a result of your drinking?: No 10. Has a relative or friend or a doctor or another health worker been concerned about your drinking or suggested you cut down?: No Alcohol Use Disorder Identification Test Final Score (AUDIT): 0 Brief Intervention: AUDIT score less than 7 or less-screening does not suggest unhealthy drinking-brief intervention not indicated Substance Abuse History in the last 12 months:  Yes.  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 Family History:  Family History  Problem Relation Age of Onset  . Mental illness Neg Hx    Family Psychiatric  History: denies Tobacco Screening: Have you used any form of tobacco in the last 30 days? (Cigarettes, Smokeless Tobacco, Cigars, and/or Pipes): No Social History: Reports she is single and that she lives in [place] with her sister , who support her, has a 46 yr old child who is being taken care of by sister. History  Alcohol Use No     History  Drug Use No    Additional Social History:      Pain Medications: See MAR Prescriptions: See MAR Over the Counter: See MAR History of alcohol / drug use?: No history of alcohol / drug abuse                    Allergies:  No Known Allergies Lab Results:  Results for orders placed or performed  during the hospital encounter of 12/10/15 (from the past 48 hour(s))  Urinalysis, Routine w reflex microscopic (not at [facility])     Status: Abnormal   Collection Time: 12/11/15  7:07 PM  Result Value Ref Range   Color, Urine YELLOW YELLOW   APPearance TURBID (A) CLEAR   Specific Gravity, Urine 1.015 1.005 - 1.030   pH 5.5 5.0 - 8.0   Glucose, UA NEGATIVE NEGATIVE mg/dL   Hgb urine dipstick SMALL (A) NEGATIVE   Bilirubin Urine NEGATIVE NEGATIVE   Ketones, ur NEGATIVE NEGATIVE mg/dL   Protein, ur NEGATIVE NEGATIVE mg/dL   Nitrite NEGATIVE NEGATIVE   Leukocytes, UA LARGE (A) NEGATIVE  Urine microscopic-add on     Status: Abnormal   Collection Time: 12/11/15  7:07 PM  Result Value Ref Range   Squamous Epithelial / LPF TOO NUMEROUS TO COUNT (A) NONE SEEN   WBC, UA TOO NUMEROUS TO COUNT 0 - 5 WBC/hpf   RBC / HPF 0-5 0 - 5 RBC/hpf   Bacteria, UA MANY (A) NONE SEEN   Urine-Other YEAST PRESENT     Blood Alcohol level:  Lab Results  Component Value Date   ETH <5 [id number]    Metabolic Disorder Labs:  No results found for: HGBA1C, MPG No results found for: PROLACTIN Lab Results  Component Value Date   CHOL  09/08/2008    136        ATP III CLASSIFICATION:  <200     mg/dL   Desirable  200-239  mg/dL   Borderline High  >=240    mg/dL   High          TRIG 107 09/08/2008   HDL 32 (L) 09/08/2008   CHOLHDL 4.3 09/08/2008   VLDL 21 09/08/2008   LDLCALC  09/08/2008    83        Total Cholesterol/HDL:CHD Risk Coronary Heart Disease Risk Table                     Men   Women  1/2 Average Risk   3.4   3.3  Average Risk       5.0   4.4  2 X Average Risk   9.6   7.1  3 X Average Risk  23.4   11.0        Use the calculated Patient Ratio above and the CHD Risk Table to determine the patient's CHD Risk.        ATP III CLASSIFICATION (LDL):  <100     mg/dL   Optimal  100-129  mg/dL   Near or Above                    Optimal  130-159  mg/dL   Borderline  160-189  mg/dL   High   >190     mg/dL   Very High    Current Medications: Current Facility-Administered Medications  Medication Dose Route Frequency Provider Last Rate Last Dose  . acetaminophen (TYLENOL) tablet 650 mg  650 mg Oral Q6H PRN Niel Hummer, NP   650 mg at 12/13/15 0843  . alum & mag hydroxide-simeth (MAALOX/MYLANTA) 200-200-20 MG/5ML suspension 30 mL  30 mL Oral Q4H PRN Niel Hummer, NP      . DULoxetine (CYMBALTA) DR capsule 20 mg  20 mg Oral Daily Niel Hummer, NP   20 mg at 12/13/15 0841  . feeding supplement (ENSURE ENLIVE) (ENSURE ENLIVE) liquid 237 mL  237 mL Oral BID BM Derrill [place], NP   237 mL at 12/13/15 1000  . hydrOXYzine (ATARAX/VISTARIL) tablet 25 mg  25 mg Oral Q6H PRN Niel Hummer, NP      . magnesium hydroxide (MILK OF MAGNESIA) suspension 30 mL  30 mL Oral Daily PRN Niel Hummer, NP      . OLANZapine (ZYPREXA) tablet 5 mg  5 mg Oral TID PRN Ursula Alert, MD       Or  . OLANZapine (ZYPREXA) injection 5 mg  5 mg Intramuscular TID PRN Ursula Alert, MD      . risperiDONE (RISPERDAL) tablet 0.5 mg  0.5 mg Oral BID Niel Hummer, NP   0.5 mg at 12/13/15 0841  . sulfamethoxazole-trimethoprim (BACTRIM DS,SEPTRA DS) 800-160 MG per tablet 1 tablet  1 tablet Oral Q12H Ursula Alert, MD   1 tablet at 12/13/15 1202  . traZODone (DESYREL) tablet 50 mg  50 mg Oral QHS PRN Niel Hummer, NP   50 mg at 12/12/15 2134   PTA Medications: Prescriptions Prior to Admission  Medication Sig Dispense Refill Last Dose  .  HYDROcodone-acetaminophen (NORCO/VICODIN) 5-325 MG tablet Take 1 tablet by mouth every 4 (four) hours as needed for moderate pain. 10 tablet 0 Unknown at Unknown time  . ibuprofen (ADVIL,MOTRIN) 800 MG tablet Take 1 tablet (800 mg total) by mouth 3 (three) times daily. 21 tablet 0 Unknown at Unknown time  . promethazine (PHENERGAN) 25 MG tablet Take 1 tablet (25 mg total) by mouth every 6 (six) hours as needed for nausea or vomiting. 30 tablet 0 Unknown at Unknown time  . traMADol  (ULTRAM) 50 MG tablet Take 150 mg by mouth every 3 (three) hours as needed for moderate pain.   Unknown at Unknown time    Musculoskeletal: Strength & Muscle Tone: within normal limits Gait & Station: normal Patient leans: N/A  Psychiatric Specialty Exam: Physical Exam  Nursing note and vitals reviewed. Constitutional:  I CONCUR WITH PE done in ED.    Review of Systems  Musculoskeletal: Positive for myalgias.  Psychiatric/Behavioral: Positive for depression, hallucinations (COMMAND ASKING HER TO KILL SELF) and substance abuse. The patient is nervous/anxious.   All other systems reviewed and are negative.   Blood pressure 113/72, pulse 96, temperature 98.5 F (36.9 C), temperature source Oral, resp. rate 16, height 5' 0.24" (1.53 m), weight 55.3 kg (122 lb), last menstrual period 11/17/2015.Body mass index is 23.64 kg/m.  General Appearance: Fairly Groomed  Eye Contact:  Fair  Speech:  Slow  Volume:  Decreased  Mood:  Anxious and Depressed  Affect:  Congruent  Thought Process:  Goal Directed and Descriptions of Associations: Circumstantial  Orientation:  Other:  tp person, situation  Thought Content:  Hallucinations: Auditory Command:  run, hide, kill self, Paranoid Ideation and Rumination  Suicidal Thoughts:  denies - but is hearing voices asking her to kill self  Homicidal Thoughts:  No  Memory:  Immediate;   Fair Recent;   Fair Remote;   Fair  Judgement:  Impaired  Insight:  Shallow  Psychomotor Activity:  Decreased  Concentration:  Concentration: Fair and Attention Span: Fair  Recall:  [employer] of Knowledge:  Fair  Language:  Fair  Akathisia:  No  Handed:  Right  AIMS (if indicated):     Assets:  Desire for Improvement  ADL's:  Intact  Cognition:  WNL  Sleep:  Number of Hours: 6.5    Treatment Plan Summary:Khaliya Ricarda Kless is a 40 y.o. 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 Pt also has a UTI - hence her sx likely due to a combination of tramadol abuse as well as UTI . Daily contact with patient to assess and evaluate symptoms and progress in treatment and Medication management   Patient will benefit from inpatient treatment and stabilization.  Estimated length of stay is 5-7 days.  Reviewed past medical records,treatment plan.  Will continue Cymbalta 20 mg po daily for affective sx. Will continue Risperidone 0.5 mg po bid for psychosis. Will add Bactrim 800 mg po bid for UTI. Will continue PRN medications as per agitation protocol. Will also get TSH, lipid panel, hba1c, PL ,vitamin b12 , folate, rpr - cbc - wnl, cmp - AST - slightly elevated , pregnancy test - negative , CT scan head -  negative. Will continue to monitor vitals ,medication compliance and treatment side effects while patient is here.  Will monitor for medical issues as well as call consult as needed.  Reviewed labs ,will order as needed.  CSW will start working on disposition.  Patient to participate in therapeutic milieu .      Observation Level/Precautions:  15 minute checks    Psychotherapy:  Individual and group therapy     Consultations:  CSW  Discharge Concerns:  Stability and safety       Physician Treatment Plan for Primary Diagnosis: Delirium due to multiple etiologies, persistent, mixed level of activity Long Term Goal(s): Improvement in symptoms so as ready for discharge  Short Term Goals: Ability to disclose and discuss suicidal ideas and Ability to identify triggers associated with substance abuse/mental health issues will improve  Physician Treatment Plan for Secondary Diagnosis: Principal Problem:   Delirium due to multiple etiologies, persistent, mixed level of activity Active  Problems:   MDD (major depressive disorder), single episode, moderate (HCC)   Opioid use disorder, mild, abuse   UTI (urinary tract infection)  Long Term Goal(s): Improvement in symptoms so as ready for discharge  Short Term Goals: Ability to disclose and discuss suicidal ideas and Ability to identify triggers associated with substance abuse/mental health issues will improve  I certify that inpatient services furnished can reasonably be expected to improve the patient's condition.    Ursula Alert, MD 9/11/20171:48 PM

## 2015-12-13 NOTE — [facility] Group Notes (Signed)
[facility] LCSW Group Therapy  12/13/2015 1:15 pm  Type of Therapy: Process Group Therapy  Participation Level:  Active  Participation Quality:  Appropriate  Affect:  Flat  Cognitive:  Oriented  Insight:  Improving  Engagement in Group:  Limited  Engagement in Therapy:  Limited  Modes of Intervention:  Activity, Clarification, Education, Problem-solving and Support  Summary of Progress/Problems: Today's group addressed the issue of overcoming obstacles.  Patients were asked to identify their biggest obstacle post d/c that stands in the way of their on-going success, and then problem solve as to how to manage this. Stayed the entire time, engaged throughout.  "My main obstacle was my meds.  I din't like the side effects that I read about.  The Dr here is giving me the same meds, but at a lower dose.  I still do not want to take them."  Agreed that following up with a Dr is the best idea.   Kathy Rosales 12/13/2015   4:09 PM

## 2015-12-14 ENCOUNTER — Encounter (HOSPITAL_COMMUNITY): Payer: Self-pay | Admitting: Psychiatry

## 2015-12-14 LAB — VITAMIN B12: Vitamin B-12: >7500 pg/mL — ABNORMAL HIGH (ref 180–914)

## 2015-12-14 LAB — LIPID PANEL
Cholesterol: 149 mg/dL (ref 0–200)
HDL: 40 mg/dL — ABNORMAL LOW (ref >40)
LDL Cholesterol: 73 mg/dL (ref 0–99)
Total CHOL/HDL Ratio: 3.7 RATIO
Triglycerides: 180 mg/dL — ABNORMAL HIGH (ref <150)
VLDL: 36 mg/dL (ref 0–40)

## 2015-12-14 LAB — RPR: RPR Ser Ql: NONREACTIVE

## 2015-12-14 LAB — FOLATE: Folate: 11.2 ng/mL (ref >5.9)

## 2015-12-14 MED ORDER — RISPERIDONE 1 MG PO TABS
1.0000 mg | ORAL_TABLET | Freq: Every day | ORAL | Status: DC
  Administered 2015-12-14: 1 mg via ORAL
  Filled 2015-12-14 (×3): qty 1

## 2015-12-14 MED ORDER — TRAZODONE HCL 100 MG PO TABS
100.0000 mg | ORAL_TABLET | Freq: Every day | ORAL | Status: DC
  Administered 2015-12-14: 100 mg via ORAL
  Filled 2015-12-14 (×3): qty 1

## 2015-12-14 MED ORDER — BENZTROPINE MESYLATE 0.5 MG PO TABS
0.5000 mg | ORAL_TABLET | Freq: Every day | ORAL | Status: DC
  Administered 2015-12-14: 0.5 mg via ORAL
  Filled 2015-12-14: qty 7
  Filled 2015-12-14 (×2): qty 1

## 2015-12-14 MED ORDER — DULOXETINE HCL 30 MG PO CPEP
30.0000 mg | ORAL_CAPSULE | Freq: Every day | ORAL | Status: DC
Start: 2015-12-14 — End: 2015-12-15
  Administered 2015-12-14 – 2015-12-15 (×2): 30 mg via ORAL
  Filled 2015-12-14 (×5): qty 1

## 2015-12-14 MED ORDER — RISPERIDONE 0.5 MG PO TABS: 0.5000 mg | ORAL_TABLET | ORAL | Status: DC

## 2015-12-14 NOTE — Progress Notes (Signed)
Adult Psychoeducational Group Note  Date:  12/14/2015 Time:  8:47 PM  Group Topic/Focus:  Wrap-Up Group:   The focus of this group is to help patients review their daily goal of treatment and discuss progress on daily workbooks.   Participation Level:  Active  Participation Quality:  Appropriate  Affect:  Appropriate  Cognitive:  Appropriate  Insight: Appropriate  Engagement in Group:  Engaged  Modes of Intervention:  Discussion  Additional Comments: The patient expressed that she attended all groups.The patient also said that she ahd a difficult evening. Nash Shearer 12/14/2015, 8:47 PM

## 2015-12-14 NOTE — Tx Team (Signed)
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 acetaminophen (TYLENOL) tablet 650 mg  650 mg Oral Q6H PRN Niel Hummer, NP   650 mg at 12/13/15 1539  . alum & mag hydroxide-simeth (MAALOX/MYLANTA) 200-200-20 MG/5ML suspension 30 mL  30 mL Oral Q4H PRN Niel Hummer, NP      . DULoxetine (CYMBALTA) DR capsule 20 mg  20 mg Oral Daily Niel Hummer, NP   20 mg at 12/13/15 0841  . feeding supplement (ENSURE ENLIVE) (ENSURE ENLIVE) liquid 237 mL  237 mL Oral BID BM Derrill [place], NP   237 mL at 12/13/15 1500  . hydrOXYzine (ATARAX/VISTARIL) tablet 25 mg  25 mg Oral Q6H PRN Niel Hummer, NP   25 mg at 12/13/15 2154  . magnesium hydroxide (MILK OF MAGNESIA) suspension 30 mL  30 mL Oral Daily PRN Niel Hummer, NP      . OLANZapine (ZYPREXA) tablet 5 mg  5 mg Oral TID PRN Ursula Alert, MD   5 mg at 12/13/15 2346   Or  . OLANZapine (ZYPREXA) injection 5 mg  5 mg Intramuscular TID PRN Ursula Alert, MD      . risperiDONE (RISPERDAL) tablet 0.5 mg  0.5 mg Oral BID Niel Hummer, NP   0.5 mg at 12/13/15 1710  . sulfamethoxazole-trimethoprim (BACTRIM DS,SEPTRA DS) 800-160 MG per tablet 1 tablet  1 tablet Oral Q12H Ursula Alert, MD   1 tablet at 12/13/15 2100  . traZODone (DESYREL) tablet 50 mg  50 mg Oral QHS PRN Niel Hummer, NP   50 mg at 12/13/15 2153    PTA Medications: Prescriptions Prior to Admission  Medication Sig  Dispense Refill Last Dose  . HYDROcodone-acetaminophen (NORCO/VICODIN) 5-325 MG tablet Take 1 tablet by mouth every 4 (four) hours as needed for moderate pain. 10 tablet 0 Unknown at Unknown time  . ibuprofen (ADVIL,MOTRIN) 800 MG tablet Take 1 tablet (800 mg total) by mouth 3 (three) times daily. 21 tablet 0 Unknown at Unknown time  . promethazine (PHENERGAN) 25 MG tablet Take 1 tablet (25 mg total) by mouth every 6 (six) hours as needed for nausea or vomiting. 30 tablet 0 Unknown at Unknown time  . traMADol (ULTRAM) 50 MG tablet Take 150 mg by mouth every 3 (three) hours as needed for moderate pain.   Unknown at Unknown time    Treatment Modalities: Medication Management, Group therapy, Case management,  1 to 1 session with clinician, Psychoeducation, Recreational therapy.   Physician Treatment Plan for Primary Diagnosis: Delirium due to multiple etiologies, persistent, mixed level of activity Long Term Goal(s): Improvement in symptoms so as ready for discharge  Short Term Goals: Ability to identify changes in lifestyle to reduce recurrence of condition will improve  Medication Management: Evaluate patient's response, side effects, and tolerance of medication regimen.  Therapeutic Interventions: 1 to 1 sessions, Unit Group sessions and Medication administration.  Evaluation of Outcomes: Met  States she has no intention of taking narcotics again.  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  Therapeutic Interventions: 1 to 1 sessions, Unit Group sessions and Medication administration.  Evaluation of Outcomes: Progressing   RN Treatment Plan for Primary Diagnosis: Delirium due to multiple etiologies, persistent, mixed level of activity Long Term Goal(s): Knowledge of disease and therapeutic regimen to maintain health will improve  Short Term Goals: Ability to identify and develop effective coping behaviors will improve  Medication Management: RN will administer medications as ordered by provider, will assess and evaluate patient's response and provide education to patient for prescribed medication. RN will report any adverse and/or side effects to prescribing provider.  Therapeutic Interventions: 1 on 1 counseling sessions, Psychoeducation, Medication administration, Evaluate responses to treatment, Monitor vital signs and CBGs as ordered, Perform/monitor CIWA, COWS, AIMS and Fall Risk screenings as ordered, Perform wound care treatments as ordered.  Evaluation of Outcomes: Progressing   LCSW Treatment Plan for Primary Diagnosis: Delirium due to multiple etiologies, persistent, mixed level of activity Long Term Goal(s): Safe transition to appropriate next level of care at discharge, Engage patient in therapeutic group addressing interpersonal concerns.  Short Term Goals: Engage patient in aftercare planning with referrals and resources  Therapeutic Interventions: Assess for all discharge needs, 1 to 1 time with Social worker, Explore available resources and support systems, Assess for adequacy in community support network, Educate family and significant other(s) on suicide prevention, Complete Psychosocial Assessment, Interpersonal group therapy.  Evaluation of Outcomes: Met   Progress in Treatment: Attending groups: Yes Participating in groups: Yes Taking medication as prescribed: Yes Toleration medication:  Yes, no side effects reported at this time Family/Significant other contact made: No Patient understands diagnosis: Yes AEB asking for help with "confusion" Discussing patient identified problems/goals with staff: Yes Medical problems stabilized or resolved: Yes Denies suicidal/homicidal ideation: Yes Issues/concerns per patient self-inventory: None Other: N/A  New problem(s) identified: None identified at this time.   New Short Term/Long Term Goal(s): None identified at this time.   Discharge Plan or Barriers: return home, follow up outpt  Reason for Continuation of Hospitalization: Delusions  Depression Hallucinations Medication stabilization Withdrawal symptoms  Estimated Length of Stay: 2-4 days  Attendees: Patient: 12/14/2015  8:24 AM  Physician: Ursula Alert, MD 12/14/2015  8:24 AM  Nursing: Hoy Register, RN 12/14/2015  8:24 AM  RN Care Manager: Lars Pinks, RN 12/14/2015  8:24 AM  Social Worker: Ripley Fraise 12/14/2015  8:24 AM  Recreational Therapist: Laretta Bolster  12/14/2015  8:24 AM  Other: Norberto Sorenson 12/14/2015  8:24 AM  Other:  12/14/2015  8:24 AM    Scribe for Treatment Team:  Roque Lias 12/14/2015 8:24 AM

## 2015-12-14 NOTE — BHH Counselor (Signed)
Adult Comprehensive Assessment  Patient ID: Kathy Rosales, female   DOB: 08-13-1975, 40 y.o.   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  States she heklps out around the house with cleaning, cooking, so it works out well for everyone Physical health (include injuries & life threatening diseases): diagnosed with Still's disrease 9 years ago  Living/Environment/Situation:  Living Arrangements: Other relatives Living conditions (as described by patient or guardian): there are 5 people in the house How long has patient lived in current situation?: long time  Family History:  Marital status: Separated Separated, when?: 11 years What types of issues is patient dealing with in the relationship?: no contact Does patient have children?: Yes How many children?: 1 How is patient's relationship with their children?: 60 YO daughter, good  Childhood History:  By whom was/is the patient raised?: Both parents Additional childhood history information: Pt has lived here since 86,  Description of patient's relationship with caregiver when they were a child: good Patient's description of current relationship with people who raised him/her: good-they came to see me last night-will probably be here until the weekend, when they will rturn to [place] Does patient have siblings?: Yes Number of Siblings: 8 Description of patient's current relationship with siblings: 2 live here in [place]-relationship is good with both Did patient suffer any verbal/emotional/physical/sexual abuse as a child?: No Did patient suffer from severe childhood neglect?: No Has patient ever been sexually abused/assaulted/raped as an  adolescent or adult?: No Was the patient ever a victim of a crime or a disaster?: No Witnessed domestic violence?: No Has patient been effected by domestic violence as an adult?: No  Education:  Highest grade of school patient has completed: graduated from middle school isn [place] Currently a Ship broker?: No Learning disability?: No  Employment/Work Situation:   Employment situation: Unemployed Patient's job has been impacted by current illness: No What is the longest time patient has a held a job?: 6 years Where was the patient employed at that time?: [place] Has patient ever been in the [employer]?: No Are There Guns or Other Weapons in [place]?: No  Financial Resources:   Financial resources: No income Does patient have a Programmer, applications or guardian?: No  Alcohol/Substance Abuse:   If attempted suicide, did drugs/alcohol play a role in this?: No Has alcohol/substance abuse ever caused legal problems?: No  Social Support System:   Patient's Community Support System: Good Describe Community Support System: 2 sisters Type of faith/religion: Darrick Meigs How does patient's faith help to cope with current illness?: "I pray"  Leisure/Recreation:   Leisure and Hobbies: play basketball with my daughter  Strengths/Needs:   What things does the patient do well?: cooking, laundry, things around the house In what areas does patient struggle / problems for patient: carry something heavy, or be standing for a long time-physical things  Discharge Plan:   Does patient have access to transportation?: Yes Will patient be returning to same living situation after discharge?: Yes Currently receiving community mental health services: No If no, would patient like referral for services when discharged?: Yes (What county?) Sports coach) Does patient have financial barriers related to discharge medications?: Yes Patient description of barriers related to discharge medications: No income, no  insurance; family  helps  Summary/Recommendations:   Summary and Recommendations (to be completed by the evaluator): Kathy Rosales is a 40 YO undocumented Mexican-American who is diagnosed with 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  She presented with bizarre behavior like chasing sister around the house and laying down in the road as a result of stopping her narcotic medication in combination with a UTI.  She desires to stop narcotic medication all together. Kathy Rosales will return home and follow up outpatient. In the meantime, she can benefit from crises stabilization, medication managment, therapeutic milieu and referral for services.  Kathy Rosales. 12/14/2015

## 2015-12-14 NOTE — BHH Group Notes (Signed)
[place] LCSW Group Therapy  12/14/2015 1:48 PM   Type of Therapy:  Group Therapy  Participation Level:  Active  Participation Quality:  Attentive  Affect:  Appropriate  Cognitive:  Appropriate  Insight:  Improving  Engagement in Therapy:  Engaged  Modes of Intervention:  Clarification, Education, Exploration and Socialization  Summary of Progress/Problems: Today's group focused on resilience. Stayed the entire time, engaged throughout.  Talked about [place] the states at the age of 22 and how difficult it was not knowing the language nor culture.  "I was tempted to leave many times, but I remembered what my father said-'You have to face your fears and not run from them.'  I know he did the same thing when he came to the states when he was young, and I knew if he could do it, so could I."  Roque Lias B 12/14/2015 , 1:48 PM

## 2015-12-14 NOTE — Progress Notes (Addendum)
D: Pt presents with depressed affect and mood. Observed to be restless and fidgety at medication window, with frequent shifts in position. Denies SI, HI and AVH when assessed via interpreter. Reported to Probation officer via interpreter "I'm sleepy, I did not sleep last night and I have a headache". A: Scheduled and PRN medications ( Tylenol & Vistaril) administered as prescribed with verbal education. Emotional support and availability provided to pt. Encouraged to voice needs / concerns. Informed about changes to Trazodone due to reported insomnia. Pt was assessed by NP (May) for bilateral hand edema and left ankle edema. No new orders given, per NP monitor swelling. Pt encouraged to elevate feet when sitting.  Q 15 minutes safety checks maintained without self harm gestures or outbursts to note thus far. R: Pt receptive to care. Reported relief from headache and anxiety when reassessed. Slept 2 hours this afternoon as well, stated "I feel ok". Pt went off unit for meals and returned without issues. Attended scheduled groups. Cooperative with unit routines. Remains safe on and off unit. POC maintained.

## 2015-12-14 NOTE — Progress Notes (Signed)
Recreation Therapy Notes  Date: 12/14/15 Time: 1000 Location:  500 Hall Group Room  Group Topic: Self-Esteem  Goal Area(s) Addresses:  Patient will identify the importance of recognizing your uniqueness. Patient will verbalize benefit of increased self-esteem.  Behavioral Response: Engaged  Intervention: Visual merchandiser, magazines, scissors, glue sticks, colored pencils  Activity: Advertising.  LRT went over the concept of advertising and the purpose of it.  Patients were to look through the magazines to find pictures or words that describe and highlight the uniqueness about them.  Patients could also use colored pencils to write words or draw pictures if they couldn't find any in the magazines.   Education:  Self-Esteem, Dentist.   Education Outcome: Acknowledges education/In group clarification offered/Needs additional education  Clinical Observations/Feedback: Pt stated the activity was easy and that what she was able to find was a good representation of her.  Pt stated finding her uniqueness was important because "many people don't see the good things about you".   Victorino Sparrow, LRT/CTRS      Ria Comment, Lavere Stork A 12/14/2015 12:00 PM

## 2015-12-14 NOTE — Progress Notes (Signed)
[facility] MD Progress Note  12/14/2015 1:50 PM Kathy Rosales  MRN:  [id number] Subjective: Pt states " I am not sleeping."    Objective: Kathy Rosales a 40 y.o.hispanic female, who has a hx of ? Stills disease - states has joint/bone pain issues from the same , who is single , lives in [place] with her sister , who presented to the ED after having command [place] telling her to "run into traffic."  Patient seen and chart reviewed.Discussed patient with treatment team.  Patient today seen as withdrawn to bed , reports she continues to have some anxiety sx and sleep issues. Sleep is a major concern today. Pt otherwise is alert , oriented x3 and denies any AH. Pt reports paranoia as improving. Per staff - pt continues to have sleep issues and is observed as needing encouragement. Tolerating medications well .    Principal Problem: Delirium due to multiple etiologies, persistent, mixed level of activity ( resolving) ( tramadol, UTI, Steroid use)  Diagnosis:   Patient Active Problem List   Diagnosis Date Noted  . 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 MDD (major depressive disorder), single episode, moderate ([place]) [F32.1] 12/13/2015  . Opioid use disorder, mild, abuse [F11.10] 12/13/2015  . UTI (urinary tract infection) [N39.0] 12/13/2015  . Fatigue [R53.83] 12/01/2015  . Orthostatic hypotension [I95.1] 12/01/2015  . Helicobacter positive gastritis [B96.81] 08/06/2015  . Gastric ulcer [K25.9] 08/06/2015  . Iron deficiency anemia due to chronic blood loss [D50.0] 08/06/2015  . Anemia [D64.9] 07/21/2015  . Iron deficiency anemia [D50.9] 10/22/2014  . B12 deficiency [E53.8] 10/22/2014  . Symptomatic anemia [D64.9] 08/28/2014  . Headache [R51] 08/28/2014  . Chest pain [R07.9] 08/28/2014  . 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   Past  Medical History:  Past Medical History:  Diagnosis Date  . Blood transfusion without reported diagnosis   . Still's disease [facility])     Past Surgical History:  Procedure Laterality Date  . COLONOSCOPY WITH PROPOFOL N/A 07/23/2015   Procedure: COLONOSCOPY WITH PROPOFOL;  Surgeon: Ronald Lobo, MD;  Location: [facility] END[facility]COPY;  Service: Endoscopy;  Laterality: N/A;  . ESOPHAGOGASTRODUODENOSCOPY (EGD) WITH PROPOFOL N/A 07/23/2015   Procedure: ESOPHAGOGASTRODUODENOSCOPY (EGD) WITH PROPOFOL;  Surgeon: Ronald Lobo, MD;  Location: [facility];  Service: Endoscopy;  Laterality: N/A;  . ESOPHAGOGASTRODUODENOSCOPY (EGD) WITH PROPOFOL N/A 11/18/2015   Procedure: ESOPHAGOGASTRODUODENOSCOPY (EGD) WITH PROPOFOL;  Surgeon: Ronald Lobo, MD;  Location: [facility];  Service: Endoscopy;  Laterality: N/A;   Family History:  Family History  Problem Relation Age of Onset  . Mental illness Neg Hx   . Cancer Paternal Grandfather    Family Psychiatric  History: Please see H&P.  Social History: Please see H&P.  History  Alcohol Use No     History  Drug Use No    Social History   Social History  . Marital status: Married    Spouse name: N/A  . Number of children: N/A  . Years of education: N/A   Social History Main Topics  . Smoking status: Never Smoker  . Smokeless tobacco: Never Used  . Alcohol use No  . Drug use: No  . Sexual activity: No   Other Topics Concern  . None   Social History Narrative   ** Merged History Encounter **       Additional Social History:    Pain  Medications: See MAR Prescriptions: See MAR Over the Counter: See MAR History of alcohol / drug use?: No history of alcohol / drug abuse                    Sleep: Poor  Appetite:  Fair  Current Medications: Current Facility-Administered Medications  Medication Dose Route Frequency Provider Last Rate Last Dose  . acetaminophen (TYLENOL) tablet 650 mg  650 mg Oral Q6H PRN Niel Hummer, NP   650 mg at  12/14/15 1057  . alum & mag hydroxide-simeth (MAALOX/MYLANTA) 200-200-20 MG/5ML suspension 30 mL  30 mL Oral Q4H PRN Niel Hummer, NP      . benztropine (COGENTIN) tablet 0.5 mg  0.5 mg Oral QHS Hally Colella, MD      . DULoxetine (CYMBALTA) DR capsule 30 mg  30 mg Oral Daily Danikah Budzik, MD   30 mg at 12/14/15 1024  . feeding supplement (ENSURE ENLIVE) (ENSURE ENLIVE) liquid 237 mL  237 mL Oral BID BM Derrill Center, NP   237 mL at 12/14/15 1025  . hydrOXYzine (ATARAX/VISTARIL) tablet 25 mg  25 mg Oral Q6H PRN Niel Hummer, NP   25 mg at 12/14/15 1057  . magnesium hydroxide (MILK OF MAGNESIA) suspension 30 mL  30 mL Oral Daily PRN Niel Hummer, NP      . OLANZapine (ZYPREXA) tablet 5 mg  5 mg Oral TID PRN Ursula Alert, MD   5 mg at 12/13/15 2346   Or  . OLANZapine (ZYPREXA) injection 5 mg  5 mg Intramuscular TID PRN Ursula Alert, MD      . risperiDONE (RISPERDAL) tablet 1 mg  1 mg Oral QHS Terald Jump, MD      . sulfamethoxazole-trimethoprim (BACTRIM DS,SEPTRA DS) 800-160 MG per tablet 1 tablet  1 tablet Oral Q12H Ursula Alert, MD   1 tablet at 12/14/15 1022  . traZODone (DESYREL) tablet 100 mg  100 mg Oral QHS Ursula Alert, MD        Lab Results:  Results for orders placed or performed during the hospital encounter of 12/12/15 (from the past 48 hour(s))  Vitamin B12     Status: Abnormal   Collection Time: 12/14/15  6:28 AM  Result Value Ref Range   Vitamin B-12 >7,500 (H) 180 - 914 pg/mL    Comment: RESULT CONFIRMED BY AUTOMATED DILUTION (NOTE) This assay is not validated for testing neonatal or myeloproliferative syndrome specimens for Vitamin B12 levels. Performed at [facility]   Folate     Status: None   Collection Time: 12/14/15  6:28 AM  Result Value Ref Range   Folate 11.2 >5.9 ng/mL    Comment: Performed at [facility]  RPR     Status: None   Collection Time: 12/14/15  6:28 AM  Result Value Ref Range   RPR Ser Ql Non Reactive Non Reactive     Comment: (NOTE) Performed At: [facility] [place], [place] [id number] Lindon Romp MD [id number] Performed at [facility]   Lipid panel     Status: Abnormal   Collection Time: 12/14/15  6:28 AM  Result Value Ref Range   Cholesterol 149 0 - 200 mg/dL   Triglycerides 180 (H) <150 mg/dL   HDL 40 (L) >40 mg/dL   Total CHOL/HDL Ratio 3.7 RATIO   VLDL 36 0 - 40 mg/dL   LDL Cholesterol 73 0 - 99 mg/dL    Comment:  Total Cholesterol/HDL:CHD Risk Coronary Heart Disease Risk Table                     Men   Women  1/2 Average Risk   3.4   3.3  Average Risk       5.0   4.4  2 X Average Risk   9.6   7.1  3 X Average Risk  23.4   11.0        Use the calculated Patient Ratio above and the CHD Risk Table to determine the patient's CHD Risk.        ATP III CLASSIFICATION (LDL):  <100     mg/dL   Optimal  100-129  mg/dL   Near or Above                    Optimal  130-159  mg/dL   Borderline  160-189  mg/dL   High  >190     mg/dL   Very High Performed at [facility]     Blood Alcohol level:  Lab Results  Component Value Date   [facility] <5 [id number]    Metabolic Disorder Labs: Lab Results  Component Value Date   HGBA1C 4.3 (L) 09/29/2015   No results found for: PROLACTIN Lab Results  Component Value Date   CHOL 149 12/14/2015   TRIG 180 (H) 12/14/2015   HDL 40 (L) 12/14/2015   CHOLHDL 3.7 12/14/2015   VLDL 36 12/14/2015   LDLCALC 73 12/14/2015   LDLCALC  09/08/2008    83        Total Cholesterol/HDL:CHD Risk Coronary Heart Disease Risk Table                     Men   Women  1/2 Average Risk   3.4   3.3  Average Risk       5.0   4.4  2 X Average Risk   9.6   7.1  3 X Average Risk  23.4   11.0        Use the calculated Patient Ratio above and the CHD Risk Table to determine the patient's CHD Risk.        ATP III CLASSIFICATION (LDL):  <100     mg/dL   Optimal  100-129  mg/dL   Near or Above                     Optimal  130-159  mg/dL   Borderline  160-189  mg/dL   High  >190     mg/dL   Very High    Physical Findings: AIMS: Facial and Oral Movements Muscles of Facial Expression: None, normal Lips and Perioral Area: None, normal Jaw: None, normal Tongue: None, normal,Extremity Movements Upper (arms, wrists, hands, fingers): None, normal Lower (legs, knees, ankles, toes): None, normal, Trunk Movements Neck, shoulders, hips: None, normal, Overall Severity Severity of abnormal movements (highest score from questions above): None, normal Incapacitation due to abnormal movements: None, normal Patient's awareness of abnormal movements (rate only patient's report): No Awareness, Dental Status Current problems with teeth and/or dentures?: No Does patient usually wear dentures?: No  CIWA:    COWS:     Musculoskeletal: Strength & Muscle Tone: within normal limits Gait & Station: normal Patient leans: N/A  Psychiatric Specialty Exam: Physical Exam  Nursing note and vitals reviewed.   Review of Systems  Psychiatric/Behavioral: Positive for depression and substance abuse.  The patient has insomnia.   All other systems reviewed and are negative.   Blood pressure 98/67, pulse (!) 115, temperature 98.2 F (36.8 C), temperature source Oral, resp. rate 16, height 5' 0.24" (1.53 m), weight 55.3 kg (122 lb), last menstrual period 11/17/2015.Body mass index is 23.64 kg/m.  General Appearance: Guarded  Eye Contact:  Poor  Speech:  Slow  Volume:  Decreased  Mood:  Anxious and Dysphoric  Affect:  Constricted  Thought Process:  Goal Directed and Descriptions of Associations: Intact  Orientation:  Full (Time, Place, and Person)  Thought Content:  Paranoid Ideation and Rumination  Suicidal Thoughts:  No  Homicidal Thoughts:  No  Memory:  Immediate;   Fair Recent;   Fair Remote;   Fair  Judgement:  Impaired  Insight:  Shallow  Psychomotor Activity:  Decreased  Concentration:   Concentration: Fair and Attention Span: Fair  Recall:  [employer] of Knowledge:  Fair  Language:  Fair  Akathisia:  No  Handed:  Right  AIMS (if indicated):     Assets:  Desire for Improvement  ADL's:  Intact  Cognition:  WNL  Sleep:  Number of Hours: 4.75     Treatment Plan Summary:Kathy Rosales a 40 y.o.hispanic female, who has a hx of ? Stills disease - states has joint/bone pain issues from the same , who is single , lives in [place] with her sister , who presented to the ED after having command [place] telling her to "run into traffic." Patient states she continues to struggle with sleep, paranoia is improving, will continue treatment.  Daily contact with patient to assess and evaluate symptoms and progress in treatment and Medication management Will increase Cymbalta to 30 mg po daily for affective sx. Will change Risperidone to 1 mg po qhs for psychosis. Will add Cogentin 0.5 mg po qhs for EPS. Will increase Trazodone to 100 mg po qhs for sleep. Will continue Bactrim 800 mg po bid for UTI. Will continue PRN medications as per agitation protocol. Reviewed TSH- wnl , lipid panel - wnl , pending  hba1c, pending PL ,vitamin b12- elevated  , folate- pending , rpr NR - cbc - wnl, cmp - AST - slightly elevated , pregnancy test - negative , CT scan head - negative. Will continue to monitor vitals ,medication compliance and treatment side effects while patient is here.  Will monitor for medical issues as well as call consult as needed CSW will continue working on disposition.  Patient to participate in therapeutic milieu  Panagiotis Oelkers, MD 12/14/2015, 1:50 PM

## 2015-12-14 NOTE — Progress Notes (Signed)
D: Pt denies SI/HI/AVH. Pt is pleasant and cooperative. Pt goal for today is to work on getting better sleep. A: Pt was offered support and encouragement. Pt was given scheduled medications. Pt was encourage to attend groups. Q 15 minute checks were done for safety.  R:Pt attends groups.. Pt is taking medication. Pt has no complaints.Pt receptive to treatment and safety maintained on unit.

## 2015-12-14 NOTE — Progress Notes (Signed)
Recreation Therapy Notes  INPATIENT RECREATION THERAPY ASSESSMENT  Patient Details Name: Kathy Rosales MRN: [id number] DOB: Aug 23, 1975 Today's Date: 12/14/2015  Patient Stressors: Pt stated she was here for depression and sometimes she hears voices.  Coping Skills:   Isolate, Self-Injury, Exercise, Talking  Personal Challenges: Anger, Concentration, Decision-Making, Expressing Yourself, Stress Management  Leisure Interests (2+):  Social - Family, Sports - Basketball  Awareness of Community Resources:  No  Patient Strengths:  "Face problems, keeping busy at home"  Patient Identified Areas of Improvement:  "depression, leave medication (stop medication)  Current Recreation Participation:  Every other day  Patient Goal for Hospitalization:  "discharge with no medication (tramedal)"  [place] of Residence:  [place] of Residence:  [place]   Current SI (including self-harm):  No  Current HI:  No  Consent to Intern Participation: N/A  Victorino Sparrow, LRT/CTRS   Victorino Sparrow A 12/14/2015, 2:48 PM

## 2015-12-14 NOTE — Progress Notes (Signed)
   D: Pt was observed in the dayroom with her interpretor. Pt appeared to be watching television. During the assessment pt would sometimes answer questions before the interpretor was able to speak. Stated that she understands most english. When asked if she had any questions or concerns pt stated, that her roommate was talking all night and wanted to make the [facility] too cold. Pt requested and received another blanket. Pt had informed the interpretor that she was worse today than previous day, because of the decreased sleep. Pt has no other questions or concerns.   A: Pt was offered ear plugs and encouraged to let the writer know if she has any difficulty sleeping tonight. Support and encouragement was offered. 15 min checks continued for safety.  R: Pt remains safe.

## 2015-12-15 LAB — URINE CULTURE: Culture: <10000 — AB

## 2015-12-15 LAB — HEMOGLOBIN A1C
Hgb A1c MFr Bld: 5.8 % — ABNORMAL HIGH (ref 4.8–5.6)
Mean Plasma Glucose: 120 mg/dL

## 2015-12-15 MED ORDER — IBUPROFEN 400 MG PO TABS
ORAL_TABLET | ORAL | Status: AC
  Filled 2015-12-15: qty 1

## 2015-12-15 MED ORDER — HYDROXYZINE HCL 25 MG PO TABS: 25.0000 mg | ORAL_TABLET | Freq: Four times a day (QID) | ORAL | 0 refills | Status: DC | PRN

## 2015-12-15 MED ORDER — TRAZODONE HCL 100 MG PO TABS: 100.0000 mg | ORAL_TABLET | Freq: Every day | ORAL | 0 refills | Status: DC

## 2015-12-15 MED ORDER — BENZTROPINE MESYLATE 0.5 MG PO TABS: 0.5000 mg | ORAL_TABLET | Freq: Every day | ORAL | 0 refills | Status: DC

## 2015-12-15 MED ORDER — SULFAMETHOXAZOLE-TRIMETHOPRIM 800-160 MG PO TABS: 1.0000 | ORAL_TABLET | Freq: Two times a day (BID) | ORAL | 0 refills | Status: DC

## 2015-12-15 MED ORDER — INFLUENZA VAC SPLIT QUAD 0.5 ML IM SUSY
0.5000 mL | PREFILLED_SYRINGE | Freq: Once | INTRAMUSCULAR | Status: AC
  Administered 2015-12-15: 0.5 mL via INTRAMUSCULAR

## 2015-12-15 MED ORDER — DULOXETINE HCL 30 MG PO CPEP: 30.0000 mg | ORAL_CAPSULE | Freq: Every day | ORAL | 0 refills | Status: DC

## 2015-12-15 MED ORDER — IBUPROFEN 400 MG PO TABS
400.0000 mg | ORAL_TABLET | Freq: Four times a day (QID) | ORAL | Status: DC | PRN
  Administered 2015-12-15: 400 mg via ORAL

## 2015-12-15 MED ORDER — RISPERIDONE 1 MG PO TABS: 1.0000 mg | ORAL_TABLET | Freq: Every day | ORAL | 0 refills | Status: DC

## 2015-12-15 MED ORDER — INFLUENZA VAC SPLIT QUAD 0.5 ML IM SUSY
0.5000 mL | PREFILLED_SYRINGE | INTRAMUSCULAR | Status: DC
  Filled 2015-12-15: qty 0.5

## 2015-12-15 NOTE — Discharge Summary (Signed)
Physician Discharge Summary Note  Patient:  Kathy Rosales is an 40 y.o., female MRN:  [id number] DOB:  May 22, 1975 Patient phone:  [phone] (home)  Patient address:   [address] 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 Patient is oriented to place stating "hospital" when asked but finds it difficult to answer other questions in reference to the assessment. 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 Husband states patient has never been on any mental health medications or displayed any behaviors associated with AH until today.Patient presents with a very fearful affect and is incoherent at times not understanding this writer's questions. Collateral information from husband stated patient's sister was at their residence this date and observed patient acting "very strange like she was hearing something" and would not respond to her questions. Patient became agitated and eventually stated voices were "telling her to run, run out into the cars." Admission note states: "Pt reports command hallucination telling her to "get hit by a car." Pt  is shaking, states she is cold-warm blanket given to pt. Pt is alert and oriented to person only. "  Patient was evaluated with help of spanish interpreter. Pt reports that she was in her normal state of health unit a few days ago , when she started having sadness, anxiety sx, sleep issues and Command AH asking her to run and hide as well as hurt self . Pt reports she was on Tramadol for her pain , but she was abusing it by using more than what was prescribed. Per pt she stopped using tramadol abruptly and all of a sudden started having anxiety sx, sleep issues and paranoia and AH as described above. Pt reports she is hearing command AH asking her to hurt self , but her paranoia and agitation is improving since she started taking medications. Pt denies past hx of mental illness, denies hx of depression, anxiety or hx of abuse. Pt denies abusing any other substances.  Principal Problem: Delirium due to multiple etiologies, persistent, mixed level of activity Discharge Diagnoses: Patient Active Problem List   Diagnosis Date Noted  . 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 MDD (major depressive disorder), single episode, moderate ([place]) [F32.1] 12/13/2015  . Opioid use disorder, mild, abuse [F11.10] 12/13/2015  . UTI (urinary tract infection) [N39.0] 12/13/2015  . Fatigue [R53.83] 12/01/2015  . Orthostatic hypotension [I95.1] 12/01/2015  . Helicobacter positive gastritis [B96.81] 08/06/2015  . Gastric ulcer [K25.9] 08/06/2015  . Iron deficiency anemia due to chronic blood loss [D50.0] 08/06/2015  . Anemia [D64.9] 07/21/2015  . Iron  deficiency anemia [D50.9] 10/22/2014  . B12 deficiency [E53.8] 10/22/2014  . Symptomatic anemia [D64.9] 08/28/2014  . Headache [R51] 08/28/2014  . Chest pain [R07.9] 08/28/2014  . 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 Blood transfusion without reported diagnosis   . Still's disease [facility])     Past Surgical History:  Procedure Laterality Date  . COLONOSCOPY WITH PROPOFOL N/A 07/23/2015   Procedure: COLONOSCOPY WITH PROPOFOL;  Surgeon: Ronald Lobo, MD;  Location: [facility] END[facility]COPY;  Service: Endoscopy;  Laterality: N/A;  . ESOPHAGOGASTRODUODENOSCOPY (EGD) WITH PROPOFOL N/A 07/23/2015   Procedure: ESOPHAGOGASTRODUODENOSCOPY (EGD) WITH PROPOFOL;  Surgeon: Ronald Lobo, MD;  Location: [facility];  Service: Endoscopy;  Laterality: N/A;  . ESOPHAGOGASTRODUODENOSCOPY (EGD) WITH PROPOFOL N/A 11/18/2015   Procedure: ESOPHAGOGASTRODUODENOSCOPY (EGD) WITH PROPOFOL;  Surgeon: Ronald Lobo, MD;  Location: [facility];  Service: Endoscopy;  Laterality: N/A;   Family History:  Family History  Problem Relation Age of Onset  . Mental illness Neg Hx   . Cancer Paternal Grandfather    Family Psychiatric  History: See H&P Social History:  History  Alcohol Use No     History  Drug Use No    Social History   Social History  . Marital status: Married    Spouse name: N/A  . Number of children: N/A  . Years of education: N/A   Social History Main Topics  . Smoking status: Never Smoker  . Smokeless tobacco: Never Used  . Alcohol use No  . Drug use: No  . Sexual activity: No   Other Topics Concern  . None   Social History Narrative   ** Merged History Encounter **        Hospital Course:   Mishaela Keville was admitted for Delirium due to multiple etiologies, persistent, mixed level of activity , and crisis management.  Pt was treated discharged with the medications listed below under Medication List.  Medical problems were identified and treated as needed.  Home medications were restarted as appropriate.  Improvement was monitored by observation and Erma Pinto 's daily report of symptom reduction.  Emotional and mental status was monitored by daily self-inventory  reports completed by Erma Pinto and clinical staff.         Izzie Charette was evaluated by the treatment team for stability and plans for continued recovery upon discharge. Kerstan Buelow 's motivation was an integral factor for scheduling further treatment. Employment, transportation, bed availability, health status, family support, and any pending legal issues were also considered during hospital stay. Pt was offered further treatment options upon discharge including but not limited to Residential, Intensive Outpatient, and Outpatient treatment.  Makenah Leaders will follow up with the services as listed below under Follow Up Information.     Upon completion of this admission the patient was both mentally and medically stable for discharge denying suicidal/homicidal ideation, auditory/visual/tactile hallucinations, delusional thoughts and paranoia.    Erma Pinto responded well to treatment with cogentin, cymbalta, risperidone, trazodone, celebrex, and vistaril without adverse effects. Pt demonstrated improvement without reported or observed adverse effects to the point of stability appropriate for outpatient management. Pertinent labs include: A1C 5.8 for which outpatient follow-up is necessary for lab recheck as mentioned below. Reviewed CBC, CMP, BAL, and UDS; all unremarkable aside from noted exceptions.   Physical Findings: AIMS: Facial and Oral Movements Muscles of Facial  Expression: None, normal Lips and Perioral Area: None, normal Jaw: None, normal Tongue: None, normal,Extremity Movements Upper (arms, wrists, hands, fingers): None, normal Lower (legs, knees, ankles, toes): None, normal, Trunk Movements Neck, shoulders, hips: None, normal, Overall Severity Severity of abnormal movements (highest score from questions above): None, normal Incapacitation due to abnormal movements: None, normal Patient's awareness of abnormal movements (rate only patient's  report): No Awareness, Dental Status Current problems with teeth and/or dentures?: No Does patient usually wear dentures?: No  CIWA:    COWS:     Musculoskeletal: Strength & Muscle Tone: within normal limits Gait & Station: normal Patient leans: N/A  Psychiatric Specialty Exam: Physical Exam  Review of Systems  Psychiatric/Behavioral: Positive for depression. Negative for hallucinations, substance abuse and suicidal ideas. The patient is nervous/anxious and has insomnia.   All other systems reviewed and are negative.   Blood pressure 98/67, pulse (!) 115, temperature 98.2 F (36.8 C), temperature source Oral, resp. rate 16, height 5' 0.24" (1.53 m), weight 55.3 kg (122 lb), last menstrual period 11/17/2015.Body mass index is 23.64 kg/m.  SEE MD PSE WITHIN THE SRA   Have you used any form of tobacco in the last 30 days? (Cigarettes, Smokeless Tobacco, Cigars, and/or Pipes): No  Has this patient used any form of tobacco in the last 30 days? (Cigarettes, Smokeless Tobacco, Cigars, and/or Pipes) Yes, No  Blood Alcohol level:  Lab Results  Component Value Date   ETH <5 [id number]    Metabolic Disorder Labs:  Lab Results  Component Value Date   HGBA1C 5.8 (H) 12/14/2015   MPG 120 12/14/2015   No results found for: PROLACTIN Lab Results  Component Value Date   CHOL 149 12/14/2015   TRIG 180 (H) 12/14/2015   HDL 40 (L) 12/14/2015   CHOLHDL 3.7 12/14/2015   VLDL 36 12/14/2015   LDLCALC 73 12/14/2015   LDLCALC  09/08/2008    83        Total Cholesterol/HDL:CHD Risk Coronary Heart Disease Risk Table                     Men   Women  1/2 Average Risk   3.4   3.3  Average Risk       5.0   4.4  2 X Average Risk   9.6   7.1  3 X Average Risk  23.4   11.0        Use the calculated Patient Ratio above and the CHD Risk Table to determine the patient's CHD Risk.        ATP III CLASSIFICATION (LDL):  <100     mg/dL   Optimal  100-129  mg/dL   Near or Above                     Optimal  130-159  mg/dL   Borderline  160-189  mg/dL   High  >190     mg/dL   Very High    See Psychiatric Specialty Exam and Suicide Risk Assessment completed by Attending Physician prior to discharge.  Discharge destination:  Home  Is patient on multiple antipsychotic therapies at discharge:  No   Has Patient had three or more failed trials of antipsychotic monotherapy by history:  No  Recommended Plan for Multiple Antipsychotic Therapies: NA     Medication List    STOP taking these medications   celecoxib 200 MG capsule Commonly known as:  CELEBREX   HYDROcodone-acetaminophen 5-325  MG tablet Commonly known as:  NORCO/VICODIN   ibuprofen 800 MG tablet Commonly known as:  ADVIL,MOTRIN   promethazine 25 MG tablet Commonly known as:  PHENERGAN   traMADol 50 MG tablet Commonly known as:  ULTRAM     TAKE these medications     Indication  benztropine 0.5 MG tablet Commonly known as:  COGENTIN Take 1 tablet (0.5 mg total) by mouth at bedtime.  Indication:  Extrapyramidal Reaction caused by Medications   DULoxetine 30 MG capsule Commonly known as:  CYMBALTA Take 1 capsule (30 mg total) by mouth daily.  Indication:  Musculoskeletal Pain   hydrOXYzine 25 MG tablet Commonly known as:  ATARAX/VISTARIL Take 1 tablet (25 mg total) by mouth every 6 (six) hours as needed for anxiety.  Indication:  Anxiety Neurosis   risperiDONE 1 MG tablet Commonly known as:  RISPERDAL Take 1 tablet (1 mg total) by mouth at bedtime.  Indication:  Psychosis   sulfamethoxazole-trimethoprim 800-160 MG tablet Commonly known as:  BACTRIM DS,SEPTRA DS Take 1 tablet by mouth every 12 (twelve) hours.  Indication:  Urinary Tract Infection   traZODone 100 MG tablet Commonly known as:  DESYREL Take 1 tablet (100 mg total) by mouth at bedtime.  Indication:  [place] [facility] Follow up on 12/24/2015.   Specialty:  Family  Medicine Why:  Friday at 1:30 with Dr  Minette Brine information: [place] .   Specialty:  Professional Counselor Why:  Go to the walk-in clinic between 8:30 and 11AM for your hospital follow up appointment Contact information: [facility] [place] [address] [phone]           Follow-up recommendations:   Activity: As tolerated Diet: Heart healthy with low sodium  Comments:   Take all medications as prescribed. Keep all follow-up appointments as scheduled.  Do not consume alcohol or use illegal drugs while on prescription medications. Report any adverse effects from your medications to your primary care provider promptly.  In the event of recurrent symptoms or worsening symptoms, call 911, a crisis hotline, or go to the nearest emergency department for evaluation.   Signed: Benjamine Mola, FNP 12/15/2015, 10:03 AM

## 2015-12-15 NOTE — BHH Suicide Risk Assessment (Signed)
[place] INPATIENT:  Family/Significant Other Suicide Prevention Education  Suicide Prevention Education:  Patient Refusal for Family/Significant Other Suicide Prevention Education: The patient Kathy Rosales has refused to provide written consent for family/significant other to be provided Family/Significant Other Suicide Prevention Education during admission and/or prior to discharge.  Physician notified.  Trish Mage 12/15/2015, 10:41 AM

## 2015-12-15 NOTE — Progress Notes (Signed)
Recreation Therapy Notes  Date: 12/15/15 Time: 1000 Location: 500 Hall Dayroom  Group Topic: Coping Skills  Goal Area(s) Addresses:  Patient will be able to identify positive coping skills. Patient will be able to identify how using positive coping skills will help them post d/c.  Intervention: Worksheet, colored pencils  Activity: Building surveyor.  Patients were given a worksheet with a Building surveyor.  Patients were to identify the situations they are facing that have them "stuck" and write them within the lines of the web.  Patients were then asked to come up with coping skills for each of the situations they are facing.  Education: Radiographer, therapeutic, Dentist.   Education Outcome: Needs additional education.   Clinical Observations/Feedback: Pt did not attend group.   Victorino Sparrow, LRT/CTRS         Victorino Sparrow A 12/15/2015 12:50 PM

## 2015-12-15 NOTE — BHH Suicide Risk Assessment (Signed)
[facility] Discharge Suicide Risk Assessment   Principal Problem: Delirium due to multiple etiologies, persistent, mixed level of activity( resolved)  Discharge Diagnoses:  Patient Active Problem List   Diagnosis Date Noted  . 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 MDD (major depressive disorder), single episode, moderate ([place]) [F32.1] 12/13/2015  . Opioid use disorder, mild, abuse [F11.10] 12/13/2015  . UTI (urinary tract infection) [N39.0] 12/13/2015  . Fatigue [R53.83] 12/01/2015  . Orthostatic hypotension [I95.1] 12/01/2015  . Helicobacter positive gastritis [B96.81] 08/06/2015  . Gastric ulcer [K25.9] 08/06/2015  . Iron deficiency anemia due to chronic blood loss [D50.0] 08/06/2015  . Anemia [D64.9] 07/21/2015  . Iron deficiency anemia [D50.9] 10/22/2014  . B12 deficiency [E53.8] 10/22/2014  . Symptomatic anemia [D64.9] 08/28/2014  . Headache [R51] 08/28/2014  . Chest pain [R07.9] 08/28/2014  . 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 The patient is not nervous/anxious and does not have insomnia.   All other systems reviewed and are negative.   Blood pressure 98/67, pulse (!) 115, temperature 98.2 F (36.8 C), temperature source Oral, resp. rate 16, height 5' 0.24" (1.53 m), weight 55.3 kg (122 lb), last menstrual period 11/17/2015.Body mass index is 23.64 kg/m.  General Appearance: Fairly Groomed  Engineer, water::  Good  Speech:  Clear and Coherent409  Volume:  Normal  Mood:  Euthymic  Affect:  Appropriate  Thought Process:  Goal Directed and Descriptions of Associations: Intact  Orientation:  Full (Time, Place, and Person)  Thought Content:   Logical  Suicidal Thoughts:  No  Homicidal Thoughts:  No  Memory:  Immediate;   Fair Recent;   Fair Remote;   Fair  Judgement:  Fair  Insight:  Fair  Psychomotor Activity:  Normal  Concentration:  Fair  Recall:  [employer] of Knowledge:Fair  Language: Fair  Akathisia:  No  Handed:  Right  AIMS (if indicated):     Assets:  Communication Skills Desire for Improvement  Sleep:  Number of Hours: 6  Cognition: WNL  ADL's:  Intact   Mental Status Per Nursing Assessment::   On Admission:  Suicidal ideation indicated by patient  Demographic Factors:  hispanic  Loss Factors: NA  Historical Factors: Impulsivity  Risk Reduction Factors:   Positive social support  Continued Clinical Symptoms:  Medical Diagnoses and Treatments/Surgeries  Cognitive Features That Contribute To Risk:  None    Suicide Risk:  Minimal: No identifiable suicidal ideation.  Patients presenting with no risk factors but with morbid ruminations; may be classified as minimal risk based on the severity of the depressive symptoms  Follow-up [place] [facility] [place] Follow up on 12/24/2015.   Specialty:  Family Medicine Why:  Friday at 1:30 with Dr  Minette Brine information: [place] .   Specialty:  Professional Counselor Why:  Go to the walk-in clinic between 8:30 and 11AM for your hospital follow up appointment Contact information: [facility] [place] [address] [phone]           Plan Of Care/Follow-up recommendations:  Activity:  no restrictions Diet:  regular Other:  as needed  Narya Beavin, MD 12/15/2015, 9:24 AM

## 2015-12-15 NOTE — Progress Notes (Signed)
  [facility] Adult Case Management Discharge Plan :  Will you be returning to the same living situation after discharge:  Yes,  home with family At discharge, do you have transportation home?: Yes,  family Do you have the ability to pay for your medications: Yes  Mental health  Release of information consent forms completed and in the chart;  Patient's signature needed at discharge.  Patient to Follow up at: Follow-up [place] [facility] [place] [facility] Follow up on 12/24/2015.   Specialty:  Family Medicine Why:  Friday at 1:30 with Dr  Minette Brine information: [place] .   Specialty:  Professional Counselor Why:  Go to the walk-in clinic between 8:30 and 11AM for your hospital follow up appointment Contact information: [facility] [place] [address] [phone]           Next level of care provider has access to [place] and Suicide Prevention discussed: Yes,  yes  Have you used any form of tobacco in the last 30 days? (Cigarettes, Smokeless Tobacco, Cigars, and/or Pipes): No  Has patient been referred to the Quitline?: N/A patient is not a smoker  Patient has been referred for addiction treatment: [place] 12/15/2015, 10:41 AM

## 2015-12-15 NOTE — Tx Team (Signed)
Interdisciplinary Treatment and Diagnostic Plan Update  12/15/2015 Time of Session: 10:37 AM  Kathy Rosales MRN: [id number]  Principal Diagnosis: Delirium due to multiple etiologies, persistent, mixed level of activity  Secondary Diagnoses: Principal Problem:   Delirium due to multiple etiologies, persistent, mixed level of activity Active Problems:   MDD (major depressive disorder), single episode, moderate (HCC)   Opioid use disorder, mild, abuse   UTI (urinary tract infection)   Current Medications:  Current Facility-Administered Medications  Medication Dose Route Frequency Provider Last Rate Last Dose  . acetaminophen (TYLENOL) tablet 650 mg  650 mg Oral Q6H PRN Niel Hummer, NP   650 mg at 12/15/15 0959  . alum & mag hydroxide-simeth (MAALOX/MYLANTA) 200-200-20 MG/5ML suspension 30 mL  30 mL Oral Q4H PRN Niel Hummer, NP      . benztropine (COGENTIN) tablet 0.5 mg  0.5 mg Oral QHS Ursula Alert, MD   0.5 mg at 12/14/15 2103  . DULoxetine (CYMBALTA) DR capsule 30 mg  30 mg Oral Daily Ursula Alert, MD   30 mg at 12/15/15 0838  . feeding supplement (ENSURE ENLIVE) (ENSURE ENLIVE) liquid 237 mL  237 mL Oral BID BM Derrill Center, NP   237 mL at 12/14/15 1643  . hydrOXYzine (ATARAX/VISTARIL) tablet 25 mg  25 mg Oral Q6H PRN Niel Hummer, NP   25 mg at 12/14/15 1057  . ibuprofen (ADVIL,MOTRIN) tablet 400 mg  400 mg Oral Q6H PRN Benjamine Mola, FNP      . magnesium hydroxide (MILK OF MAGNESIA) suspension 30 mL  30 mL Oral Daily PRN Niel Hummer, NP      . OLANZapine (ZYPREXA) tablet 5 mg  5 mg Oral TID PRN Ursula Alert, MD   5 mg at 12/13/15 2346   Or  . OLANZapine (ZYPREXA) injection 5 mg  5 mg Intramuscular TID PRN Ursula Alert, MD      . risperiDONE (RISPERDAL) tablet 1 mg  1 mg Oral QHS Ursula Alert, MD   1 mg at 12/14/15 2103  . sulfamethoxazole-trimethoprim (BACTRIM DS,SEPTRA DS) 800-160 MG per tablet 1 tablet  1 tablet Oral Q12H Ursula Alert, MD   1 tablet at  12/15/15 0838  . traZODone (DESYREL) tablet 100 mg  100 mg Oral QHS Ursula Alert, MD   100 mg at 12/14/15 2103    PTA Medications: Prescriptions Prior to Admission  Medication Sig Dispense Refill Last Dose  . celecoxib (CELEBREX) 200 MG capsule Take 1 capsule (200 mg total) by mouth daily. 30 capsule 1 Taking  . HYDROcodone-acetaminophen (NORCO/VICODIN) 5-325 MG tablet Take 1 tablet by mouth every 4 (four) hours as needed for moderate pain. 10 tablet 0 Unknown at Unknown time  . ibuprofen (ADVIL,MOTRIN) 800 MG tablet Take 1 tablet (800 mg total) by mouth 3 (three) times daily. 21 tablet 0 Unknown at Unknown time  . promethazine (PHENERGAN) 25 MG tablet Take 1 tablet (25 mg total) by mouth every 6 (six) hours as needed for nausea or vomiting. 30 tablet 0 Unknown at Unknown time  . traMADol (ULTRAM) 50 MG tablet Take 1 tablet (50 mg total) by mouth every 6 (six) hours as needed. 60 tablet 0 Taking  . traMADol (ULTRAM) 50 MG tablet Take 150 mg by mouth every 3 (three) hours as needed for moderate pain.   Unknown at Unknown time  . [DISCONTINUED] DULoxetine (CYMBALTA) 30 MG capsule Take 1 capsule (30 mg total) by mouth daily. 30 capsule 1 Taking  Treatment Modalities: Medication Management, Group therapy, Case management,  1 to 1 session with clinician, Psychoeducation, Recreational therapy.   Physician Treatment Plan for Primary Diagnosis: Delirium due to multiple etiologies, persistent, mixed level of activity Long Term Goal(s): Improvement in symptoms so as ready for discharge  Short Term Goals: Ability to identify changes in lifestyle to reduce recurrence of condition will improve  Medication Management: Evaluate patient's response, side effects, and tolerance of medication regimen.  Therapeutic Interventions: 1 to 1 sessions, Unit Group sessions and Medication administration.  Evaluation of Outcomes: Met  States she has no intention of taking narcotics again.  Plans to flush her  meds when she gets home  Physician Treatment Plan for Secondary Diagnosis: Principal Problem:   Delirium due to multiple etiologies, persistent, mixed level of activity Active Problems:   MDD (major depressive disorder), single episode, moderate (HCC)   Opioid use disorder, mild, abuse   UTI (urinary tract infection)   Long Term Goal(s): Improvement in symptoms so as ready for discharge  Short Term Goals: Ability to identify and develop effective coping behaviors will improve  Medication Management: Evaluate patient's response, side effects, and tolerance of medication regimen.  Therapeutic Interventions: 1 to 1 sessions, Unit Group sessions and Medication administration.  Evaluation of Outcomes: Met   RN Treatment Plan for Primary Diagnosis: Delirium due to multiple etiologies, persistent, mixed level of activity Long Term Goal(s): Knowledge of disease and therapeutic regimen to maintain health will improve  Short Term Goals: Ability to identify and develop effective coping behaviors will improve  Medication Management: RN will administer medications as ordered by provider, will assess and evaluate patient's response and provide education to patient for prescribed medication. RN will report any adverse and/or side effects to prescribing provider.  Therapeutic Interventions: 1 on 1 counseling sessions, Psychoeducation, Medication administration, Evaluate responses to treatment, Monitor vital signs and CBGs as ordered, Perform/monitor CIWA, COWS, AIMS and Fall Risk screenings as ordered, Perform wound care treatments as ordered.  Evaluation of Outcomes: Met   LCSW Treatment Plan for Primary Diagnosis: Delirium due to multiple etiologies, persistent, mixed level of activity Long Term Goal(s): Safe transition to appropriate next level of care at discharge, Engage patient in therapeutic group addressing interpersonal concerns.  Short Term Goals: Engage patient in aftercare planning  with referrals and resources  Therapeutic Interventions: Assess for all discharge needs, 1 to 1 time with Social worker, Explore available resources and support systems, Assess for adequacy in community support network, Educate family and significant other(s) on suicide prevention, Complete Psychosocial Assessment, Interpersonal group therapy.  Evaluation of Outcomes: Met   Progress in Treatment: Attending groups: Yes Participating in groups: Yes Taking medication as prescribed: Yes Toleration medication: Yes, no side effects reported at this time Family/Significant other contact made: No Patient understands diagnosis: Yes AEB asking for help with "confusion" Discussing patient identified problems/goals with staff: Yes Medical problems stabilized or resolved: Yes Denies suicidal/homicidal ideation: Yes Issues/concerns per patient self-inventory: None Other: N/A  New problem(s) identified: None identified at this time.   New Short Term/Long Term Goal(s): None identified at this time.   Discharge Plan or Barriers: return home, follow up outpt  Reason for Continuation of Hospitalization:   Estimated Length of Stay: D/C today  Attendees: Patient: 12/15/2015  10:37 AM  Physician: Ursula Alert, MD 12/15/2015  10:37 AM  Nursing: Hoy Register, RN 12/15/2015  10:37 AM  RN Care Manager: Lars Pinks, RN 12/15/2015  10:37 AM  Social Worker: Ripley Fraise 12/15/2015  10:37 AM  Recreational Therapist: Marjette  12/15/2015  10:37 AM  Other: Norberto Sorenson 12/15/2015  10:37 AM  Other:  12/15/2015  10:37 AM    Scribe for Treatment Team:  Roque Lias 12/15/2015 10:37 AM

## 2015-12-15 NOTE — Progress Notes (Addendum)
Nursing Note 12/15/2015 0700-Discharge  Data Reports sleeping "great" with sleep med.  Affect appropriate mood euthymic.  Denies HI, SI, AVH.  Attending groups, appropriate with staff and peers.   Received discharge orders.     Action Spoke with patient 1:1, nurse offered support to patient throughout shift..   Reviewed medications, discharge instructions, and follow up appointments with patient and brother (consent signed) with interpreter present. Medication samples handed to patient scripts handed to patient.  Paperwork, AVS, SRA, and transition record handed to patient.   Escorted off of unit at 1257. Belongings returned per belongings form.  Discharged to lobby.   Response Patient verbalized understanding of discharge orders.  Agrees to contact someone or 911 if she has thoughts/intent to harm self or others.  To follow up per AVS.

## 2015-12-24 ENCOUNTER — Ambulatory Visit: Payer: Self-pay | Admitting: Family Medicine

## 2015-12-27 ENCOUNTER — Other Ambulatory Visit: Payer: Self-pay | Admitting: Family Medicine

## 2015-12-27 ENCOUNTER — Ambulatory Visit: Payer: No Typology Code available for payment source | Admitting: Family Medicine

## 2015-12-27 DIAGNOSIS — L298 Other pruritus: Secondary | ICD-10-CM

## 2015-12-27 NOTE — Telephone Encounter (Signed)
I believe this is a medication started by psychiatrists, you can send a refill but she really needs to continue following with psychiatrist as recommended. Hydroxyzine 25 mg qid as needed for anxiety # 90/0. Thanks, BJ

## 2015-12-27 NOTE — Telephone Encounter (Signed)
Okay to refill? 

## 2015-12-28 ENCOUNTER — Ambulatory Visit (INDEPENDENT_AMBULATORY_CARE_PROVIDER_SITE_OTHER): Payer: No Typology Code available for payment source | Admitting: Family Medicine

## 2015-12-28 ENCOUNTER — Encounter: Payer: Self-pay | Admitting: Family Medicine

## 2015-12-28 VITALS — BP 110/78 | HR 92 | Temp 98.3°F | Resp 12 | Ht 63.0 in | Wt 134.5 lb

## 2015-12-28 DIAGNOSIS — L298 Other pruritus: Secondary | ICD-10-CM

## 2015-12-28 MED ORDER — METHYLPREDNISOLONE ACETATE 80 MG/ML IJ SUSP
40.0000 mg | Freq: Once | INTRAMUSCULAR | Status: AC
  Administered 2015-12-28: 40 mg via INTRAMUSCULAR

## 2015-12-28 MED ORDER — PREDNISONE 20 MG PO TABS: ORAL_TABLET | ORAL | 0 refills | Status: AC

## 2015-12-28 MED ORDER — CETIRIZINE HCL 10 MG PO CAPS: 10.0000 mg | ORAL_CAPSULE | Freq: Every day | ORAL | 1 refills | Status: DC

## 2015-12-28 MED ORDER — FAMOTIDINE 20 MG PO TABS: 20.0000 mg | ORAL_TABLET | Freq: Two times a day (BID) | ORAL | 0 refills | Status: DC

## 2015-12-28 NOTE — Telephone Encounter (Signed)
Rx sent 

## 2015-12-28 NOTE — Progress Notes (Signed)
HPI:  ACUTE VISIT:  Chief Complaint  Patient presents with  . Allergic Reaction    Ms.Katheleen Hamson is a 40 y.o. female, who is here today with her sister complaining of pruritic skin rash that started on forearms about 2 days ago and now on back, upper chest, abdomen and lower extremities.   She denies any new detergent, soap, or body product. Completed 2 weeks of Bactrim 3 days ago. Started on Risperdal 1 mg,Trazodone 100 mg, and Benztropine 0.5 mg about 2 weeks ago, and started Cymbalta 3-4 weeks ago.  She denies any chills, fever, sore throat, or other viral like symptom.  No known insect bite or outdoor exposures to plants. No sick contact. No Hx of eczema or similar rash in the past.  OTC medication for this problem: Cetirizine, Benadryl,and topical OTC medications; did not help.   She denies oral lesions/edema,cough, wheezing, dyspnea, abdominal pain, nausea, or vomiting.  She was hospitalized 12/12/15 and discharged 12/15/15 Dx delirium and Tramadol abuse. Current medications have helped with symptoms, she has no other concern today.    Review of Systems  Constitutional: Negative for appetite change, chills, fatigue and fever.  HENT: Negative for congestion, ear pain, facial swelling, mouth sores, nosebleeds, sneezing, sore throat, trouble swallowing and voice change.   Eyes: Negative for discharge, redness, itching and visual disturbance.  Respiratory: Negative for cough, shortness of breath and wheezing.   Cardiovascular: Negative for chest pain, palpitations and leg swelling.  Gastrointestinal: Negative for abdominal pain, diarrhea, nausea and vomiting.  Musculoskeletal: Negative for arthralgias, joint swelling and myalgias.  Skin: Positive for rash. Negative for wound.  Allergic/Immunologic: Negative for environmental allergies and food allergies.  Neurological: Negative for weakness, numbness and headaches.  Hematological: Negative for adenopathy.  Does not bruise/bleed easily.      Current Outpatient Prescriptions on File Prior to Visit  Medication Sig Dispense Refill  . benztropine (COGENTIN) 0.5 MG tablet Take 1 tablet (0.5 mg total) by mouth at bedtime. 30 tablet 0  . DULoxetine (CYMBALTA) 30 MG capsule Take 1 capsule (30 mg total) by mouth daily. 30 capsule 0  . hydrOXYzine (ATARAX/VISTARIL) 25 MG tablet Take 1 tablet by mouth four times daily as needed for anxiety. 90 tablet 0  . risperiDONE (RISPERDAL) 1 MG tablet Take 1 tablet (1 mg total) by mouth at bedtime. 30 tablet 0  . traZODone (DESYREL) 100 MG tablet Take 1 tablet (100 mg total) by mouth at bedtime. 30 tablet 0   No current facility-administered medications on file prior to visit.      Past Medical History:  Diagnosis Date  . Blood transfusion without reported diagnosis   . 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 Leflunomide Rash    Social History   Social History  . Marital status: Married    Spouse name: N/A  . Number of children: N/A  . Years of education: N/A   Social History Main Topics  . Smoking status: Never Smoker  . Smokeless tobacco: Never Used  . Alcohol use No  . Drug use: No  . Sexual activity: No   Other Topics Concern  . None   Social History Narrative   ** Merged History Encounter **        Vitals:   12/28/15 1530  BP: 110/78  Pulse: 92  Resp: 12  Temp: 98.3 F (36.8 C)    O2 sat at RA 98%  Body mass index is 23.83  kg/m.    Physical Exam  Nursing note and vitals reviewed. Constitutional: She is oriented to person, place, and time. She appears well-developed and well-nourished. She does not appear ill. No distress.  HENT:  Head: Atraumatic.  Mouth/Throat: Oropharynx is clear and moist and mucous membranes are normal.  Eyes: Conjunctivae are normal.  Cardiovascular: Normal rate and regular rhythm.   No murmur heard. Respiratory: Effort normal and breath sounds normal. No respiratory  distress.  Musculoskeletal: She exhibits no edema.  Lymphadenopathy:       Head (right side): No submandibular adenopathy present.       Head (left side): No submandibular adenopathy present.    She has no cervical adenopathy.       Right: No supraclavicular adenopathy present.       Left: No supraclavicular adenopathy present.  Neurological: She is alert and oriented to person, place, and time.  Skin: Skin is warm. Rash noted. Rash is papular. Rash is not maculopapular and not vesicular.     Micropapular, mildly erythematous, confluent rash scattered on extremities, back, chest, and abdomen. No tender, no local heat. No facial, mucosa, palmar, or plantar involvement.  Psychiatric: She has a normal mood and affect. Her speech is normal.  Well groomed, good eye contact.      ASSESSMENT AND PLAN:     Melita was seen today for allergic reaction.  Diagnoses and all orders for this visit:  Pruritic erythematous rash -     predniSONE (DELTASONE) 20 MG tablet; 3 tabs for 3 days, 2 tabs for 3 days, 1 tabs for 3 days, and 1/2 tab for 3 days. Take tables together with breakfast. -     famotidine (PEPCID) 20 MG tablet; Take 1 tablet (20 mg total) by mouth 2 (two) times daily. -     Cetirizine HCl 10 MG CAPS; Take 1 capsule (10 mg total) by mouth daily. -     methylPREDNISolone acetate (DEPO-MEDROL) injection 40 mg; Inject 0.5 mLs (40 mg total) into the muscle once.    We discussed possible causes, not sure about which one of her new medications could be responsible for this rash. I do not want to discontinue any for now because psychiatric symptoms have improved greatly. She has an appointment tomorrow with her psychiatrist, consider reviewing medications and consider changing one at the time if appropriate.  After discussion of treatment options, she would like to proceed with DepoMedrol 40 mg IM x 1 and tomorrow continue with Prednisone taper. She has been on Prednisone before and  overall she tolerated well. She will continue with Hydroxyzine, cetirizine 10 mg daily, and start Pepcid 20 mg twice per day. We discussed side effects of each medication.  She was instructed about warning signs.       -Ms.Ferne Leverton was advised to return or notify a doctor immediately if symptoms worsen or persist or new concerns arise, she and her sister voice understanding.       Palin Tristan G. [place], MD  [facility]. [place] office.

## 2015-12-28 NOTE — Progress Notes (Signed)
Pre visit review using our clinic review tool, if applicable. No additional management support is needed unless otherwise documented below in the visit note. 

## 2015-12-28 NOTE — Patient Instructions (Signed)
A few things to remember from today's visit:   Pruritic erythematous rash - Plan: predniSONE (DELTASONE) 20 MG tablet, famotidine (PEPCID) 20 MG tablet, Cetirizine HCl 10 MG CAPS  Dificil saber cual medication esta causando brote. Manana co psychiatra decida cual medication puede parar si es apropriado.  Pepci 20 mg 2 veces al dia.  Prednisone con desayuno.   Please be sure medication list is accurate. If a new problem present, please set up appointment sooner than planned today.

## 2015-12-30 ENCOUNTER — Other Ambulatory Visit: Payer: Self-pay | Admitting: *Deleted

## 2015-12-30 DIAGNOSIS — D509 Iron deficiency anemia, unspecified: Secondary | ICD-10-CM

## 2016-01-03 ENCOUNTER — Other Ambulatory Visit: Payer: Self-pay

## 2016-01-03 ENCOUNTER — Ambulatory Visit: Payer: Self-pay | Admitting: Hematology

## 2016-01-07 ENCOUNTER — Ambulatory Visit: Payer: Self-pay | Admitting: Family Medicine

## 2016-01-26 ENCOUNTER — Ambulatory Visit: Payer: No Typology Code available for payment source | Attending: Family Medicine | Admitting: Internal Medicine

## 2016-01-26 ENCOUNTER — Encounter: Payer: Self-pay | Admitting: Internal Medicine

## 2016-01-26 ENCOUNTER — Telehealth: Payer: Self-pay | Admitting: Internal Medicine

## 2016-01-26 ENCOUNTER — Encounter: Payer: Self-pay | Admitting: Licensed Clinical Social Worker

## 2016-01-26 VITALS — BP 102/71 | HR 98 | Temp 98.3°F | Resp 16 | Wt 138.4 lb

## 2016-01-26 DIAGNOSIS — Z79899 Other long term (current) drug therapy: Secondary | ICD-10-CM | POA: Insufficient documentation

## 2016-01-26 DIAGNOSIS — F05 Delirium due to known physiological condition: Secondary | ICD-10-CM | POA: Insufficient documentation

## 2016-01-26 DIAGNOSIS — M0820 Juvenile rheumatoid arthritis with systemic onset, unspecified site: Secondary | ICD-10-CM | POA: Insufficient documentation

## 2016-01-26 DIAGNOSIS — L298 Other pruritus: Secondary | ICD-10-CM

## 2016-01-26 DIAGNOSIS — Z888 Allergy status to other drugs, medicaments and biological substances status: Secondary | ICD-10-CM | POA: Insufficient documentation

## 2016-01-26 DIAGNOSIS — L299 Pruritus, unspecified: Secondary | ICD-10-CM | POA: Insufficient documentation

## 2016-01-26 DIAGNOSIS — F321 Major depressive disorder, single episode, moderate: Secondary | ICD-10-CM | POA: Insufficient documentation

## 2016-01-26 LAB — CMP AND LIVER
ALT: 35 U/L — ABNORMAL HIGH (ref 6–29)
AST: 40 U/L — ABNORMAL HIGH (ref 10–30)
Albumin: 3.8 g/dL (ref 3.6–5.1)
Alkaline Phosphatase: 85 U/L (ref 33–115)
BUN: 15 mg/dL (ref 7–25)
Bilirubin, Direct: 0.1 mg/dL (ref <=0.2)
CO2: 24 mmol/L (ref 20–31)
Calcium: 8.8 mg/dL (ref 8.6–10.2)
Chloride: 110 mmol/L (ref 98–110)
Creat: 0.61 mg/dL (ref 0.50–1.10)
Glucose, Bld: 78 mg/dL (ref 65–99)
Indirect Bilirubin: 0.2 mg/dL (ref 0.2–1.2)
Potassium: 4.3 mmol/L (ref 3.5–5.3)
Sodium: 141 mmol/L (ref 135–146)
Total Bilirubin: 0.3 mg/dL (ref 0.2–1.2)
Total Protein: 6.2 g/dL (ref 6.1–8.1)

## 2016-01-26 LAB — BASIC METABOLIC PANEL WITH GFR
BUN: 15 mg/dL (ref 7–25)
CO2: 24 mmol/L (ref 20–31)
Calcium: 8.9 mg/dL (ref 8.6–10.2)
Chloride: 109 mmol/L (ref 98–110)
Creat: 0.64 mg/dL (ref 0.50–1.10)
GFR, Est African American: >89 mL/min (ref >=60)
GFR, Est Non African American: >89 mL/min (ref >=60)
Glucose, Bld: 80 mg/dL (ref 65–99)
Potassium: 4.2 mmol/L (ref 3.5–5.3)
Sodium: 141 mmol/L (ref 135–146)

## 2016-01-26 LAB — IRON,TIBC AND FERRITIN PANEL
%SAT: 5 % — ABNORMAL LOW (ref 11–50)
Ferritin: 23 ng/mL (ref 10–154)
Iron: 19 ug/dL — ABNORMAL LOW (ref 40–190)
TIBC: 388 ug/dL (ref 250–450)

## 2016-01-26 MED ORDER — BENZTROPINE MESYLATE 0.5 MG PO TABS: 0.5000 mg | ORAL_TABLET | Freq: Every day | ORAL | 2 refills | Status: DC

## 2016-01-26 MED ORDER — TRAZODONE HCL 100 MG PO TABS: 100.0000 mg | ORAL_TABLET | Freq: Every day | ORAL | 0 refills | Status: DC

## 2016-01-26 MED ORDER — PREDNISONE 50 MG PO TABS: 50.0000 mg | ORAL_TABLET | Freq: Every day | ORAL | 0 refills | Status: DC

## 2016-01-26 MED ORDER — DULOXETINE HCL 30 MG PO CPEP: 30.0000 mg | ORAL_CAPSULE | Freq: Every day | ORAL | 2 refills | Status: DC

## 2016-01-26 MED ORDER — HYDROXYZINE HCL 25 MG PO TABS: ORAL_TABLET | ORAL | 2 refills | Status: DC

## 2016-01-26 MED ORDER — FAMOTIDINE 20 MG PO TABS: 20.0000 mg | ORAL_TABLET | Freq: Two times a day (BID) | ORAL | 2 refills | Status: DC

## 2016-01-26 MED ORDER — DIPHENHYDRAMINE HCL 25 MG PO CAPS: 25.0000 mg | ORAL_CAPSULE | Freq: Four times a day (QID) | ORAL | 1 refills | Status: DC | PRN

## 2016-01-26 MED ORDER — RISPERIDONE 1 MG PO TABS: 1.0000 mg | ORAL_TABLET | Freq: Every day | ORAL | 2 refills | Status: DC

## 2016-01-26 MED ORDER — CETIRIZINE HCL 10 MG PO CAPS: 10.0000 mg | ORAL_CAPSULE | Freq: Every day | ORAL | 2 refills | Status: DC

## 2016-01-26 NOTE — Progress Notes (Signed)
Pt is in the office today for establish care and rash Pt states she is not in any pain Pt states she itches all over her body Pt states she has tried benadryl and nothing has helped  Pt sister states she is terribly depressed and she is on medication  Pt sister states she was in rehab for depression and she has tried to commit suicide Pt sister states she was in the rehab for a week for depression Pt sister states that last night the pt stated she wanted to die and kill Pt states she is having suicidal thoughts right now Pt states she thinks about walking and having a car run over her and if she is driving she wants to let go and have the car crash

## 2016-01-26 NOTE — Patient Instructions (Signed)
Trastorno Tour manager (Major Depressive Disorder) El trastorno depresivo mayor es una enfermedad mental. Tambin se llamado depresin clnica o depresin unipolar. Produce sentimientos de tristeza, desesperanza o desamparo. Algunas personas con trastorno depresivo mayor no se sienten particularmente tristes, pero pierden Librarian, academic las cosas que solan disfrutar (anhedonia). Tambin puede causar sntomas fsicos. Interfiere en el trabajo, la escuela, las relaciones y otras actividades diarias normales. Puede variar en gravedad, pero es ms duradera y ms grave que la tristeza que todos sentimos de vez en cuando en nuestras vidas.  Muchas veces es desencadenada por sucesos estresantes o cambios importantes en la vida. Algunos ejemplos de estos factores desencadenantes son el divorcio, la prdida del trabajo o el hogar, [place], y la muerte de un familiar o amigo cercano. A veces aparece sin ninguna razn evidente. Las personas que tienen familiares con depresin mayor o con trastorno bipolar tienen ms riesgo de desarrollar depresin mayor con o sin factores de Psychologist, forensic. Puede ocurrir a Hotel manager. Puede ocurrir slo una vez en su vida(episodio nico de trastorno depresivo mayor). Puede ocurrir varias veces (trastorno depresivo mayor recurrente).  SNTOMAS  Las personas con trastorno depresivo mayor presentan anhedonia o estado de nimo deprimido casi [employer] durante al menos 2 semanas o ms. Los sntomas son:   Sensacin de tristeza Secretary/administrator) o vaco.  Sentimientos de desesperanza o desamparo.  Lagrimeo o episodios de llanto ( es observado por los dems).  Irritabilidad (en nios y adolescentes). Adems del estado de nimo deprimido o la anhedonia o ambos, estos enfermos tienen al menos cuatro de los siguientes sntomas :   Dificultad para dormir o Aeronautical engineer.   Cambio significativo (aumento o disminucin) en el apetito o Financial controller.   Falta de energa o  motivacin.  Sentimientos de culpa o desvalorizacin.   Dificultad para concentrarse, recordar o tomar decisiones.  Movimientos inusualmente lentos (retardo psicomotor retardation) o inquietud (segn lo observado por los dems).   Deseos recurrentes de muerte, pensamientos recurrentes de autoagresin (suicidio) o intento de suicidio. Las personas con trastorno depresivo mayor suelen tener pensamientos persistentes negativos acerca de s mismos, de otras personas y del mundo. Las personas con trastorno depresivo mayor grave pueden experimentar creencias o percepciones distorsionadas sobre el mundo (delirios psicticos). Tambin pueden ver u or cosas que no son reales(alucinaciones psicticas).  DIAGNSTICO  El diagnstico se realiza mediante una evaluacin hecha por el mdico. El mdico le preguntar acerca de los aspectos de su vida cotidiana, como el [place] de nimo, el sueo y el apetito, para ver si usted tiene los sntomas de Environmental education officer. Le har preguntas sobre su historial mdico y el consumo de alcohol o drogas, incluyendo medicamentos recetados. [employer] un examen fsico y Charity fundraiser anlisis de [place]. Esto se debe a que ciertas enfermedades y el uso de determinadas sustancias pueden causar sntomas similares a la depresin (depresin secundaria). Su mdico tambin podra derivarlo a Location manager salud mental para Ardelia Mems evaluacin y [place].  TRATAMIENTO  Es Glass blower/designer los sntomas y Geographical information systems officer. Los siguientes tratamientos pueden indicarse:    Medicamentos - Generalmente se recetan antidepresivos. Los antidepresivos se piensa que corrigen los desequilibrios qumicos en el cerebro que se asocian comnmente a la depresin Chiropractor. Se pueden agregar otros tipos de medicamentos si los sntomas no responden a los antidepresivos solos o si hay ideas delirantes o alucinaciones psicticas.  Psicoterapia - ciertos tipos de psicoterapia pueden ser tiles en el  tratamiento del trastorno de  la depresin mayor, proporcionando apoyo, educacin y orientacin. Ciertos tipos de psicoterapia tambin pueden ayudar a superar los pensamientos negativos (terapia cognitivo conductual) y a problemas de relacin que desencadena la depresin mayor (terapia interpersonal). Un especialista en salud mental puede ayudarlo a determinar qu tratamiento es el mejor para usted. La [employer] de los pacientes mejoran con una combinacin de medicacin y psicoterapia. Los tratamientos que implican la estimulacin elctrica del cerebro pueden ser utilizados en situaciones con sntomas muy graves o cuando los medicamentos y la psicoterapia no funcionan despus de un [employer]. Estos tratamientos incluyen terapia electroconvulsiva, estimulacin magntica transcraneal y estimulacin del nervio vago.    Esta informacin no tiene Marine scientist el consejo del mdico. Asegrese de hacerle al mdico cualquier pregunta que tenga.   Document Released: 07/15/2012 Document Revised: 04/10/2014 Elsevier Interactive Patient Education 2016 [place] de ansiedad generalizada (Generalized Anxiety Disorder) El trastorno de ansiedad generalizada es un trastorno mental. Interfiere en las funciones vitales, incluyendo las [place], el trabajo y la escuela.  Es diferente de la ansiedad normal que todas las personas experimentan en algn momento de su vida en respuesta a sucesos y Chief Executive Officer. En verdad, la ansiedad normal nos ayuda a prepararnos y Brewing technologist acontecimientos y actividades de la vida. La ansiedad normal desaparece despus de que el evento o la actividad ha finalizado.  El trastorno de ansiedad generalizada no est necesariamente relacionada con eventos o actividades especficas. Tambin causa un exceso de ansiedad en proporcin a sucesos o actividades especficas. En este trastorno la ansiedad es difcil de Chief Technology Officer. Los sntomas pueden variar de leves a  muy graves. Las personas que sufren de trastorno de ansiedad generalizada pueden tener intensas olas de ansiedad con sntomas fsicos (ataques de pnico).  SNTOMAS  La ansiedad y la preocupacin asociada a este trastorno son difciles de Chief Technology Officer. Esta ansiedad y la preocupacin estn relacionados con muchos eventos de la vida y sus actividades y tambin ocurre durante ms [employer] que no ocurre, durante 6 meses o ms. Las personas que la sufren pueden tener tres o ms de los siguientes sntomas (uno o ms en los nios):   Customer service manager.  Dificultades de concentracin.   Irritabilidad.  Tensin muscular  Dificultad para dormirse o sueo poco satisfactorio. DIAGNSTICO  Se diagnostica a travs de una evaluacin realizada por el mdico. El mdico le har preguntas acerca de su estado de nimo, sntomas fsicos y sucesos de [place] vida. Le har preguntas sobre su historia clnica, el consumo de alcohol o drogas, incluyendo los medicamentos recetados. [employer] un examen fsico e indicar anlisis de [place]. Ciertas enfermedades y el uso de determinadas sustancias pueden causar sntomas similares a este trastorno. Su mdico lo puede derivar a Teaching laboratory technician en salud mental para una evaluacin ms profunda.Belva Crome  Las terapias siguientes se utilizan en el tratamiento de este trastorno:   Medicamentos - Se recetan antidepresivos para el control diario a Barrister's clerk. Pueden indicarse tambin medicamentos para combatir la [place] graves, especialmente cuando ocurren ataques de pnico.   Terapia conversada (psicoterapia) Ciertos tipos de psicoterapia pueden ser tiles en el tratamiento del trastorno de ansiedad generalizada, proporcionando apoyo, educacin y Fish farm manager. Una forma de psicoterapia llamada terapia cognitivo-conductual puede ensearle formas saludables de pensar y Firefighter a los eventos y actividades de la vida diaria.  Tcnicasde manejo del  estrs- Estas tcnicas incluyen el yoga, la meditacin y el ejercicio y pueden ser muy tiles  cuando se practican con regularidad. Un especialista en salud mental puede ayudar a determinar qu tratamiento es mejor para usted. Algunas personas obtienen mejora con una terapia. Sin embargo, Producer, television/film/video requieren una combinacin de terapias.    Esta informacin no tiene Marine scientist el consejo del mdico. Asegrese de hacerle al mdico cualquier pregunta que tenga.   Document Released: 07/15/2012 Document Revised: 04/10/2014 Elsevier Interactive Patient Education [employer].   -

## 2016-01-26 NOTE — BH Specialist Note (Signed)
Session Start time: 9:30 am   End Time: 10:00 pm Total Time:  30 minutes Type of Service: [place]: Yes.     Interpreter Name & Language: Fredrich Romans [id number] [place] # [facility] Visits July 2017-June 2018: 1st   SUBJECTIVE: Kathy Rosales is a 40 y.o. female  Pt. was referred by Dr. Janne Napoleon for:  anxiety and depression. Pt. reports the following symptoms/concerns: Hx of suicidal thoughts, low energy, nervousness, forgetful Duration of problem:  1 month Severity: Severe  Previous treatment: Pt recently received inpatient treatment to address MDD and Psychosis for approx 5 days. Pt has been off medication for a few days due to having no access to have them refilled   OBJECTIVE: Mood: Anxious and Depressed & Affect: Blunt Risk of harm to self or others: Pt denied SI/HI Assessments administered: PHQ-9; GAD-7  LIFE CONTEXT:  Family & Social: Pt's social support consists of two sisters (one was present during visit). 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  What is important to pt/family (values): Family, Health   GOALS ADDRESSED:  Decrease symptoms of depression Decrease symptoms of anxiety  INTERVENTIONS: Motivational Interviewing, Strength-based and Supportive   ASSESSMENT:  Pt currently experiencing depression and anxiety triggered by relapsing on prescribed medications. Pt has hx of suicidal thoughts, low energy, nervousness, and forgetfulness. Pt was recently discharged from inpatient treatment; however, was unable to refill prescribed medications resulting in reported symptoms. LCSWA discussed with pt the cycle of depression and validated how difficult it is to function with symptoms. Pt is open to taking medication and  initiating counseling. LCSWA provided pt and sister with community resources (i.e [facility]) that provides crisis intervention, therapy, and medication management. Pt plans to visit [facility] this week. Pt may benefit from psychoeducation, psychotherapy, and medication management.      PLAN: 1. F/U with behavioral health clinician: LCSWA will follow up with pt at scheduled appt (01/31/16). Pt was encouraged to contact LCSWA if symptoms worsen or fail to improve to schedule behavioral appointments at [facility]. 2. Behavioral Health meds: Cymbalta, Risperdal 3. Behavioral recommendations: LCSWA recommends that pt utilize community resources provided at visit. Pt is encouraged to schedule follow up appointment with LCSWA 4. Referral: Brief Counseling/Psychotherapy, [employer], Psychoeducation and Supportive Counseling 5. From scale of 1-10, how likely are you to follow plan: 9/10   Rebekah Chesterfield, MSW, [place] Worker 01/26/16 3:35 pm  Warmhandoff:   Warm Hand Off Completed.

## 2016-01-26 NOTE — Telephone Encounter (Signed)
Trazodone refilled.

## 2016-01-26 NOTE — Telephone Encounter (Signed)
Pt. Came in requesting a refill on traZODone (DESYREL) 100 MG tablet  Please f/u

## 2016-01-26 NOTE — Progress Notes (Signed)
Kathy Rosales, is a 40 y.o. female  [id number]  [id number]  DOB - [birth year]  CC:  Chief Complaint  Patient presents with  . Establish Care  . Rash       HPI: Kathy Rosales is a 40 y.o. female here today to establish medical care.  She has significant pmhx of Still's disease, MDD, recent hospitalization 12/12/15 - 12/15/15 for SI/delirium/psychosis/MDD.  Pt was adjusted on meds, did well, sx resolved and dc'd to home.  Per pt's sister, who is here w/ her, she did very well on her meds, sxs resolved, more happy/pleasure in life. Sister actually thought may be able to wean off meds since was doing so well.  When they went to outpt psychiatry, states went to [facility], were not able to get meds refilled. They then went back to hospital and "rehab" for med refills, w/o luck.   She recently was seen at [facility] medicine for rash 9/26 - started on steroids/pepcid/zyrtec, rash resolved.  However, pt ran out of her all her meds last 3 days and consequently increasing thoughts of suicide /sadness/crying, "hopelessness" restarted. Pt states she has had thoughts of walking into street in front of cars or crashing her car. Pt denies hi/avh currently.  Her sister states that between her and her other sister, they have been trying to be with her 24/7 lately. They drive her places and go w/ her to appts.  Sister is doing most of talking during exam.  Pt denies rash, but very pruritic all over. Scratching excessively.  Patient has No headache, No chest pain, No abdominal pain - No Nausea, No new weakness tingling or numbness, No Cough - SOB.  Interpreter was used to communicate directly with patient for the entire encounter including providing detailed patient instructions.   Review of Systems: Per HPI, o/w all systems reviewed and negative except above.   Allergies  Allergen Reactions  . Leflunomide Rash   Past Medical History:  Diagnosis Date  . Blood transfusion  without reported diagnosis   . Still's disease [facility])    Current Outpatient Prescriptions on File Prior to Visit  Medication Sig Dispense Refill  . traZODone (DESYREL) 100 MG tablet Take 1 tablet (100 mg total) by mouth at bedtime. (Patient not taking: Reported on 01/26/2016) 30 tablet 0   No current facility-administered medications on file prior to visit.    Family History  Problem Relation Age of Onset  . Mental illness Neg Hx   . Cancer Paternal Grandfather    Social History   Social History  . Marital status: Married    Spouse name: N/A  . Number of children: N/A  . Years of education: N/A   Occupational History  . Not on file.   Social History Main Topics  . Smoking status: Never Smoker  . Smokeless tobacco: Never Used  . Alcohol use No  . Drug use: No  . Sexual activity: No   Other Topics Concern  . Not on file   Social History Narrative   ** Merged History Encounter **        Objective:   Vitals:   01/26/16 0909  BP: 102/71  Pulse: 98  Resp: 16  Temp: 98.3 F (36.8 C)    Filed Weights   01/26/16 0909  Weight: 138 lb 6.4 oz (62.8 kg)    BP Readings from Last 3 Encounters:  01/26/16 102/71  12/28/15 110/78  12/14/15 98/67    Physical Exam: Constitutional: Patient appears  well-developed and well-nourished. No distress. AAOx3, flat affect, poor eye contact, but when I stand directly talking to her she will speak in English and look me in eye.  Excessive scratching throughout exam. HENT: Normocephalic, atraumatic, External right and left ear normal. Oropharynx is clear and moist.  Eyes: Conjunctivae and EOM are normal. PERRL, no scleral icterus. Neck: Normal ROM. Neck supple. No JVD.  CVS: RRR, S1/S2 +, no murmurs, no gallops, no carotid bruit.  Pulmonary: Effort and breath sounds normal, no stridor, rhonchi, wheezes, rales.  Abdominal: Soft. BS +, obese, no distension, tenderness, rebound or guarding.  Musculoskeletal: Normal range of motion.  No edema and no tenderness.  LE: bilat/ no c/c/e, pulses 2+ bilateral. Neuro: Alert. muscle tone coordination wnl. No cranial nerve deficit grossly. Skin: Skin is warm and dry. No rash noted throughout body, no excoriations from scratching.. Not diaphoretic. No erythema. No pallor. Psychiatric:   Behavior, judgment, thought content normal.  Lab Results  Component Value Date   WBC 4.7 12/10/2015   HGB 11.4 (L) 12/10/2015   HCT 36.6 12/10/2015   MCV 82.2 12/10/2015   PLT 499 (H) 12/10/2015   Lab Results  Component Value Date   CREATININE 0.64 12/10/2015   BUN 6 12/10/2015   NA 142 12/10/2015   K 3.8 12/10/2015   CL 113 (H) 12/10/2015   CO2 22 12/10/2015    Lab Results  Component Value Date   HGBA1C 5.8 (H) 12/14/2015   Lipid Panel     Component Value Date/Time   CHOL 149 12/14/2015 0628   TRIG 180 (H) 12/14/2015 0628   HDL 40 (L) 12/14/2015 0628   CHOLHDL 3.7 12/14/2015 0628   VLDL 36 12/14/2015 0628   LDLCALC 73 12/14/2015 0628       Depression screen PHQ 2/9 01/26/2016  Decreased Interest 3  Down, Depressed, Hopeless 3  PHQ - 2 Score 6  Altered sleeping 3  Tired, decreased energy 3  Change in appetite 0  Feeling bad or failure about yourself  3  Trouble concentrating 3  Moving slowly or fidgety/restless 3  Suicidal thoughts 3  PHQ-9 Score 24    Assessment and plan:   1. MDD (major depressive disorder), single episode, moderate ([place]) Suspect was doing very well on recent regimen started while inpt, but pt had trouble getting refill last 3 days, now in relapse. - dw pt and sister at length, pt contracts with me to not hurt/harm herself while I am seeing her, and the medications get back in her system. Contracts to come back and see me Monday - she has been added onto schedule Monday. Pt contracts not to drive for now until sees me again. - sister contracts that she or her other sister will try not to leave her alone.   - Ambulatory referral to Psychiatry -  urgent visit - sw to see pt today to provide area resources, including [facility] as well, sounds like they have been getting the runaround and not certain of who to turn to.  2. Delirium due to multiple etiologies, persistent, mixed level of activity Appears resolved, mentation /speach appropriate today.  3. 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 Pruritic erythematous rash - appears resolved, but significant pruritis noted today. - suspect component of drug withdrawal as well. - Cetirizine HCl 10 MG CAPS; Take 1 capsule (10 mg total) by mouth daily.  Dispense: 30 capsule; Refill: 2 - hydrOXYzine (ATARAX/VISTARIL) 25 MG tablet; Take 1 tablet by mouth four times daily as needed for anxiety.  Dispense: 90 tablet; Refill: 2 - famotidine (PEPCID) 20 MG tablet; Take 1 tablet (20 mg total) by mouth 2 (two) times daily.  Dispense: 60 tablet; Refill: 2 - benadryl rx prn - prednisone high dose rx for now  Return in about 1 week (around 02/02/2016).  The patient was given clear instructions to go to ER or return to medical center if symptoms don't improve, worsen or new problems develop. The patient verbalized understanding. The patient was told to call to get lab results if they haven't heard anything in the next week.    This note has been created with Surveyor, quantity. Any transcriptional errors are unintentional.   Maren Reamer, MD, [place], [place]   01/26/2016, 9:41 AM

## 2016-01-27 LAB — ACUTE HEP PANEL AND HEP B SURFACE AB
HCV Ab: NEGATIVE
Hep A IgM: NONREACTIVE
Hep B C IgM: NONREACTIVE
Hep B S Ab: NEGATIVE
Hepatitis B Surface Ag: NEGATIVE

## 2016-01-27 LAB — ANA: Anti Nuclear Antibody(ANA): NEGATIVE

## 2016-01-27 LAB — C-REACTIVE PROTEIN: CRP: 0.8 mg/L (ref <8.0)

## 2016-01-27 LAB — SEDIMENTATION RATE: Sed Rate: 4 mm/hr (ref 0–20)

## 2016-01-27 LAB — HIV ANTIBODY (ROUTINE TESTING W REFLEX): HIV 1&2 Ab, 4th Generation: NONREACTIVE

## 2016-01-27 LAB — RHEUMATOID FACTOR: Rhuematoid fact SerPl-aCnc: <14 IU/mL (ref <14)

## 2016-01-28 LAB — PARVOVIRUS B19 ANTIBODY, IGG AND IGM
Parovirus B19 IgG Abs: 0.6 (ref <0.9)
Parovirus B19 IgM Abs: 0.2 (ref <0.9)

## 2016-01-31 ENCOUNTER — Encounter: Payer: Self-pay | Admitting: Internal Medicine

## 2016-01-31 ENCOUNTER — Ambulatory Visit: Payer: No Typology Code available for payment source | Attending: Internal Medicine | Admitting: Internal Medicine

## 2016-01-31 VITALS — BP 102/69 | HR 81 | Temp 98.2°F | Resp 16 | Wt 137.4 lb

## 2016-01-31 DIAGNOSIS — D509 Iron deficiency anemia, unspecified: Secondary | ICD-10-CM

## 2016-01-31 DIAGNOSIS — Z23 Encounter for immunization: Secondary | ICD-10-CM

## 2016-01-31 DIAGNOSIS — Z888 Allergy status to other drugs, medicaments and biological substances status: Secondary | ICD-10-CM | POA: Insufficient documentation

## 2016-01-31 DIAGNOSIS — R5383 Other fatigue: Secondary | ICD-10-CM | POA: Insufficient documentation

## 2016-01-31 DIAGNOSIS — F323 Major depressive disorder, single episode, severe with psychotic features: Secondary | ICD-10-CM

## 2016-01-31 MED ORDER — SENNOSIDES-DOCUSATE SODIUM 8.6-50 MG PO TABS: 1.0000 | ORAL_TABLET | Freq: Every evening | ORAL | 2 refills | Status: DC | PRN

## 2016-01-31 MED ORDER — FERROUS GLUCONATE 324 (38 FE) MG PO TABS: 324.0000 mg | ORAL_TABLET | Freq: Two times a day (BID) | ORAL | 3 refills | Status: DC

## 2016-01-31 NOTE — Patient Instructions (Addendum)
Anemia por deficiencia de hierro - Adultos (Iron Deficiency Anemia, Adult) La anemia sucede cuando la cantidad de glbulos rojos sanos es baja. Con frecuencia, la causa es una cantidad insuficiente de hierro. Esto se denomina anemia por deficiencia de hierro y puede provocarle cansancio y dificultad para respirar. [place] vitaminas segn las indicaciones del mdico.  Consuma alimentos que contengan hierro. Estos incluyen hgado, carne magra, pan de grano integral, huevos, frutas deshidratadas y vegetales de hojas color verde oscuro. SOLICITE AYUDA DE INMEDIATO SI:  Pierde el conocimiento (se desmaya).  Siente dolor en el pecho.  Tiene malestar estomacal (nuseas) o vomita.  Tiene mucha dificultad para respirar cuando realiza actividades.  Se siente dbil.  La frecuencia cardaca est acelerada.  Comienza a sudar sin motivo.  Se siente mareado cuando se levanta de una silla o de la cama. ASEGRESE DE QUE:  Comprende estas instrucciones.  Controlar su afeccin.  Recibir ayuda de inmediato si no mejora o si empeora.   Esta informacin no tiene Marine scientist el consejo del mdico. Asegrese de hacerle al mdico cualquier pregunta que tenga.   Document Released: 06/24/2010 Document Revised: 08/04/2014 Elsevier Interactive Patient Education 2016 [place] Tdap (contra la difteria, el ttanos y [place]): Lo que debe saber (Tdap Vaccine [Tetanus, Diphtheria, and Pertussis]: What You Need to Know) 1. Por qu vacunarse? El ttanos, la difteria y la tosferina son enfermedades muy graves. La vacuna Tdap nos puede proteger de estas enfermedades. Adems, la vacuna Tdap que se aplica a las Chemical engineer a los bebs recin nacidos contra la tosferina. En la actualidad, el [place] (trismo) es una enfermedad poco frecuente en los [place]. Provoca la contraccin y el  endurecimiento dolorosos de los msculos, por lo general, de todo el cuerpo.  Puede causar el endurecimiento de los msculos de la cabeza y el cuello, de modo que impide abrir la boca, tragar y en algunos casos, Ambulance person. El ttanos causa la muerte de aproximadamente 1de cada 10personas que contraen la infeccin, incluso despus de que reciben la mejor atencin mdica. La DIFTERIA tambin es poco frecuente en los [place] [employer]. Puede causar la formacin de una membrana gruesa en la parte posterior de la garganta.  Esto tiene como consecuencia problemas respiratorios, insuficiencia cardaca, parlisis y [place]. La TOSFERINA (tos convulsa) provoca episodios de tos intensa que pueden dificultar la respiracin y provocar vmitos y trastornos del sueo.  Tambin puede causar prdida de peso, incontinencia y fractura de [place]. Dos de cada 100 adolescentes y 5 de cada 100 adultos con tosferina deben ser hospitalizados o tienen complicaciones, que podran incluir neumona y [place]. Estas enfermedades son provocadas por bacterias. La difteria y la tosferina se contagian de Ardelia Mems persona a otra a travs de las secreciones de la tos o el estornudo. El ttanos ingresa al organismo a travs de cortes, rasguos o heridas. Antes de las vacunas, en los [place] se informaban 200000 casos de difteria, 200000 casos de tosferina y cientos de casos de ttanos cada ao. Desde el inicio de la vacunacin, los informes de casos de ttanos y difteria han disminuido alrededor del 99%, y de tosferina, alrededor del 80%. 2. Edward Jolly Tdap La vacuna Tdap protege a adolescentes y adultos contra el ttanos, la difteria y la tosferina. Una dosis de Tdap se administra a los 2 o 12 aos. Las personas que no recibieron la vacuna Tdap  a esa edad deben recibirla tan pronto como sea posible. Es muy importante que los mdicos y todos aquellos que tengan contacto cercano con bebs menores de 15meses reciban la vacuna  Tdap. Las mujeres deben recibir una dosis de Tdap en cada Media planner, para proteger al recin nacido de la tosferina. Los nios tienen mayor riesgo de complicaciones graves y potencialmente mortales debido a la tosferina. Otra vacuna llamada Td protege contra el ttanos y la difteria, pero no contra la tosferina. Todos deben recibir una dosis de refuerzo de Td cada 10 aos. La Tdap puede aplicarse como uno de estos refuerzos si nunca antes recibi esta vacuna. Tambin se puede aplicar despus de un corte o quemadura grave para prevenir la infeccin por ttanos. El mdico o la persona que le aplique la vacuna puede darle ms informacin al [employer]. La Tdap puede administrarse de manera segura simultneamente con otras vacunas. 3. Algunas personas no deben recibir la [employer] persona que alguna vez tuvo una reaccin alrgica potencialmente mortal a Ardelia Mems dosis previa de cualquier vacuna contra el ttanos, la difteria o la tosferina, O que tenga una alergia grave a cualquiera de los componentes de esta vacuna, no debe recibir la vacuna Tdap. Informe a la persona que le aplica la vacuna si tiene cualquier alergia grave.  Una persona que estuvo en estado de coma o sufri mltiples convulsiones en el trmino de los 7das despus de recibir una dosis de DTP o DTaP, o una dosis previa de Tdap, no debe recibir la vacuna Tdap, salvo que se haya encontrado otra causa que no fuera la vacuna. An puede recibir la Td.  Consulte con su mdico si:  tiene convulsiones u otro problema del sistema nervioso,  tuvo hinchazn o dolor intenso despus de cualquier vacuna contra la difteria o el ttanos,  alguna vez ha sufrido el sndrome de [place],  no se siente Pharmacologist en que se ha programado la vacuna. 4. Riesgos Con cualquier medicamento, incluyendo las vacunas, existe la posibilidad de que aparezcan efectos secundarios. Suelen ser leves y desaparecen por s solos. Tambin son posibles las reacciones  graves, pero en raras ocasiones. La [employer] de las personas a las que se les aplica la vacuna Tdap no tienen ningn problema. Problemas leves despus de la vacuna Tdap (No interfirieron en otras actividades)  Dolor en el lugar donde se aplic la vacuna (alrededor de 3 de cada 4 adolescentes o 2 de cada 3 adultos).  Enrojecimiento o hinchazn en el lugar donde se aplic la vacuna (1 de cada 5 personas).  Fiebre leve de al menos 100,23F (38C) (hasta alrededor de 1 cada 25 adolescentes o 1 de cada 100 adultos).  Dolor de cabeza (alrededor de 3 o 4 de cada 10 personas).  Cansancio (alrededor de 1 de cada 3 o 4 personas).  Nuseas, vmitos, diarrea, dolor de estmago (hasta 1 de cada 4 adolescentes o 1 de cada 10 adultos).  Escalofros, dolores articulares (alrededor de 1de cada 10personas).  Dolores corporales (alrededor de 1de cada 3 o 4personas).  Erupcin cutnea, inflamacin de los ganglios (poco frecuente). Problemas moderados despus de recibir la vacuna Tdap (Interfirieron en otras actividades, pero no requirieron atencin mdica)  Management consultant donde se aplic la vacuna (hasta 1de cada 5 o 6).  Enrojecimiento o inflamacin en el lugar donde se aplic la vacuna (hasta alrededor de 1 de cada 16adolescentes o 1 de cada 12adultos).  Fiebre de ms de 102F (38,8C) (alrededor de 1 de cada  100 adolescentes o 1 de cada 250 adultos).  Dolor de cabeza (alrededor de 1de cada 7adolescentes o 1de cada 10adultos).  Nuseas, vmitos, diarrea, dolor de estmago (hasta 1 o 3 de cada 100 personas).  Hinchazn de todo el brazo en el que se aplic la vacuna (hasta alrededor de 1de cada 500personas). Problemas graves despus de la vacuna Tdap (Impidieron Optometrist las actividades habituales; requirieron atencin mdica)  Inflamacin, dolor intenso, sangrado y enrojecimiento en el brazo en que se aplic la vacuna (poco frecuente). Problemas que podran ocurrir despus de  cualquier vacuna:  Las personas a veces se desmayan despus de un procedimiento mdico, incluida la vacunacin. Si permanece sentado o recostado durante 15 minutos puede ayudar a [employer] y las lesiones causadas por las cadas. Informe al mdico si se siente mareado, tiene cambios en la visin o zumbidos en los odos.  Algunas personas sienten un dolor intenso en el hombro y tienen dificultad para mover el brazo donde se coloc la vacuna. Esto sucede con muy poca frecuencia.  Cualquier medicamento puede causar una reaccin alrgica grave. Dichas reacciones son Orlene Erm poco frecuentes con una vacuna (se calcula que menos de 1en un milln de dosis) y se producen de unos minutos a unas horas despus de Writer. Al igual que con cualquier [employer], existe una probabilidad muy remota de que una vacuna cause una lesin grave o la [place]. Se controla permanentemente la seguridad de las vacunas. Para obtener ms informacin, visite: http://www.aguilar.org/. 5. Qu pasa si hay un problema grave? A qu signos debo estar atento?  Observe todo lo que le preocupe, como signos de una reaccin alrgica grave, fiebre muy alta o comportamiento fuera de lo normal.  Los signos de una reaccin alrgica grave pueden incluir ronchas, hinchazn de la cara y la garganta, dificultad para respirar, latidos cardacos acelerados, mareos y debilidad. Generalmente, estos comenzaran entre unos pocos minutos y algunas horas despus de la vacunacin. Qu debo hacer?  Si usted piensa que se trata de una reaccin alrgica grave o de otra emergencia que no puede esperar, llame al 911 o lleve a la persona al hospital ms cercano. Sino, llame a su mdico.  Despus, la reaccin debe informarse al Sistema de Informacin sobre Efectos Adversos de las [place] (Vaccine Adverse Event Reporting System, VAERS). Su mdico puede presentar este informe, o puede hacerlo usted mismo a travs del sitio web de VAERS, en  www.vaers.SamedayNews.es, o llamando al [phone]. VAERS no brinda recomendaciones mdicas. 6. [place] Compensacin de Daos por [place] de Compensacin de Daos por Clinical biochemist (National Vaccine Injury Compensation Program, VICP) es un programa federal que fue creado para Patent examiner a las personas que puedan haber sufrido daos al recibir ciertas vacunas. Aquellas personas que consideren que han sufrido un dao como consecuencia de una vacuna y [place] saber ms acerca del programa y de cmo presentar Raechel Ch[place], pueden llamar al [phone] o visitar su sitio web en GoldCloset.com.ee. Hay un lmite de tiempo para presentar un reclamo de compensacin. 7. Cmo puedo obtener ms informacin?  Consulte a su mdico. Este puede darle el prospecto de la vacuna o recomendarle otras fuentes de informacin.  Comunquese con el servicio de salud de su localidad o su estado.  Comunquese con los Centros para Building surveyor y la Prevencin de Probation officer for Disease Control and Prevention , CDC).  Llame al [phone] (1-800-CDC-INFO), o  visite el sitio web [employer] en http://hunter.com/. Declaracin de informacin sobre la vacuna  contra la difteria, el ttanos y la tosferina (Tdap) de los CDC (24/02/15)   Esta informacin no tiene Marine scientist el consejo del mdico. Asegrese de hacerle al mdico cualquier pregunta que tenga.   Document Released: 03/06/2012 Document Revised: 04/10/2014 Elsevier Interactive Patient Education [employer].

## 2016-01-31 NOTE — Progress Notes (Signed)
Kathy Rosales, is a 40 y.o. female  [id number]  [id number]  DOB - Jun 18, 1975  Chief Complaint  Patient presents with  . Depression        Subjective:   Kathy Rosales is a 40 y.o. female here today for a follow up visit for depression and pruritis. She is taking her meds now, and feels better overall. No pruritis.  Less suicidal thoughts, talking much more.  Sister is w/ her. Sees slight improvement, although has bouts of sadness sometimes.  Co of chronic fatigue.  Asked about rx prednisone dosing again, they received 10mg  tabs, so have been taking 5 qday in am.    Patient has No headache, No chest pain, No abdominal pain - No Nausea, No new weakness tingling or numbness, No Cough - SOB.   Pt is here w/ her sister.  Interpreter was used to communicate directly with patient for the entire encounter including providing detailed patient instructions.   No problems updated.  ALLERGIES: Allergies  Allergen Reactions  . Leflunomide Rash    PAST MEDICAL HISTORY: Past Medical History:  Diagnosis Date  . Blood transfusion without reported diagnosis   . 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 Authorizing Provider  benztropine (COGENTIN) 0.5 MG tablet Take 1 tablet (0.5 mg total) by mouth at bedtime. 01/26/16   Maren Reamer, MD  Cetirizine HCl 10 MG CAPS Take 1 capsule (10 mg total) by mouth daily. 01/26/16 02/25/16  Maren Reamer, MD  diphenhydrAMINE (BENADRYL) 25 mg capsule Take 1 capsule (25 mg total) by mouth every 6 (six) hours as needed. 01/26/16   Maren Reamer, MD  DULoxetine (CYMBALTA) 30 MG capsule Take 1 capsule (30 mg total) by mouth daily. 01/26/16   Maren Reamer, MD  famotidine (PEPCID) 20 MG tablet Take 1 tablet (20 mg total) by mouth 2 (two) times daily. 01/26/16 02/15/16  Maren Reamer, MD  ferrous gluconate (FERGON) 324 MG tablet Take 1  tablet (324 mg total) by mouth 2 (two) times daily with a meal. 01/31/16   Maren Reamer, MD  hydrOXYzine (ATARAX/VISTARIL) 25 MG tablet Take 1 tablet by mouth four times daily as needed for anxiety. 01/26/16   Maren Reamer, MD  predniSONE (DELTASONE) 50 MG tablet Take 1 tablet (50 mg total) by mouth daily with breakfast. Take daily 50mg  daily first 10 days, than day 11-20 take 1/2 pill - take in AM. 01/26/16   Maren Reamer, MD  risperiDONE (RISPERDAL) 1 MG tablet Take 1 tablet (1 mg total) by mouth at bedtime. 01/26/16   Maren Reamer, MD  senna-docusate (SENOKOT-S) 8.6-50 MG tablet Take 1 tablet by mouth at bedtime as needed for mild constipation. 01/31/16   Maren Reamer, MD  traZODone (DESYREL) 100 MG tablet Take 1 tablet (100 mg total) by mouth at bedtime. 01/26/16   Maren Reamer, MD     Objective:   Vitals:   01/31/16 1016  BP: 102/69  Pulse: 81  Resp: 16  Temp: 98.2 F (36.8 C)  TempSrc: Oral  SpO2: 98%  Weight: 137 lb 6.4 oz (62.3 kg)    Exam General appearance : Awake, alert, not in any distress. Speech Clear. Not toxic looking, pleasant, good eye contact, speaking much more to me. Pleasant. HEENT: Atraumatic and Normocephalic, pupils equally reactive to light. Neck: supple, no JVD.  Chest:Good air entry bilaterally,  no added sounds. CVS: S1 S2 regular, no murmurs/gallups or rubs. Abdomen: Bowel sounds active, Non tender and not distended with no gaurding, rigidity or rebound. Extremities: B/L Lower Ext shows no edema, both legs are warm to touch Neurology: Awake alert, and oriented X 3, CN II-XII grossly intact, Non focal Skin:No Rash  Data Review Lab Results  Component Value Date   HGBA1C 5.8 (H) 12/14/2015   HGBA1C 4.3 (L) 09/29/2015    Depression screen PHQ 2/9 01/31/2016 01/26/2016  Decreased Interest 3 3  Down, Depressed, Hopeless 3 3  PHQ - 2 Score 6 6  Altered sleeping (No Data) 3  Tired, decreased energy 3 3  Change in appetite 0 0   Feeling bad or failure about yourself  3 3  Trouble concentrating 3 3  Moving slowly or fidgety/restless 3 3  Suicidal thoughts 1 3  PHQ-9 Score - 24      Assessment & Plan   1. Severe single current episode of major depressive disorder, with psychotic features ([place]) Cont to encourage rx, has f/u [facility] in about 1 month, encouraged to keep Pt denies suicidal paln/avh/hi today, less frequent si, but would not act upon them.  Has hot line # to call as well if SI worse.  2. Iron deficiency anemia, unspecified iron deficiency anemia type Iron bid, increase fiber, stool softeners prn  3. Chronic fatigue, suspect multifactorial #1 and #2. Should improve w/ treatment  4. tdap today  5. Needs pap     Patient have been counseled extensively about nutrition and exercise  Return in about 4 weeks (around 02/28/2016) for pap smear/ depression.  The patient was given clear instructions to go to ER or return to medical center if symptoms don't improve, worsen or new problems develop. The patient verbalized understanding. The patient was told to call to get lab results if they haven't heard anything in the next week.   This note has been created with Surveyor, quantity. Any transcriptional errors are unintentional.   Maren Reamer, MD, [facility], [place]   01/31/2016, 10:34 AM

## 2016-01-31 NOTE — Progress Notes (Signed)
Pt is in the office today for a follow up on depression Pt states her depression is not doing good  Pt states her depression is still the same Pt states she does have suicidal thoughts but it has gotten better since last visit Pt states she is having lack of energy and is very tired Pt states she is very forgetful

## 2016-02-21 ENCOUNTER — Ambulatory Visit: Payer: No Typology Code available for payment source | Attending: Internal Medicine | Admitting: Physician Assistant

## 2016-02-21 VITALS — BP 102/67 | HR 93 | Temp 99.0°F | Resp 18 | Ht 61.0 in | Wt 138.4 lb

## 2016-02-21 DIAGNOSIS — Z888 Allergy status to other drugs, medicaments and biological substances status: Secondary | ICD-10-CM | POA: Insufficient documentation

## 2016-02-21 DIAGNOSIS — F321 Major depressive disorder, single episode, moderate: Secondary | ICD-10-CM

## 2016-02-21 DIAGNOSIS — Z79899 Other long term (current) drug therapy: Secondary | ICD-10-CM | POA: Insufficient documentation

## 2016-02-21 DIAGNOSIS — R21 Rash and other nonspecific skin eruption: Secondary | ICD-10-CM | POA: Insufficient documentation

## 2016-02-21 DIAGNOSIS — L298 Other pruritus: Secondary | ICD-10-CM

## 2016-02-21 MED ORDER — CETIRIZINE HCL 10 MG PO CAPS: 10.0000 mg | ORAL_CAPSULE | Freq: Every day | ORAL | 2 refills | Status: DC

## 2016-02-21 MED ORDER — TRIAMCINOLONE ACETONIDE 0.1 % EX CREA: 1.0000 "application " | TOPICAL_CREAM | Freq: Two times a day (BID) | CUTANEOUS | 2 refills | Status: DC

## 2016-02-21 NOTE — Progress Notes (Signed)
Patient is here for Rash   Patient complains of rash being present on both sides of her neck and her legs.  Patient has taken medication today. Patient has eaten today.

## 2016-02-21 NOTE — Progress Notes (Signed)
Kathy Rosales, is a 40 y.o. female  [id number]  [id number]  DOB - 03-Nov-1975  Subjective:  Chief Complaint and HPI: Kathy Rosales is a 40 y.o. female here today for continued problems with pruritic rash. See 12/28/2015 note.  The rash improved with steroids.  Zyrtec helps some but she hasn't been taking that for several days.   She denies new soaps, detergents lotions.  The rash first started in the beginning of September.  It waxes and wanes.  It does not sound as though there was a herald patch.  The rash and itching is mostly on the radial aspect of B thumbs and her neck.  Occasionally the rash will flare on the front of her legs or her back.  The itching and rash are much more mild in these locations.  The rash was not precipitated by any medication changes.  Patient speaks quite adequate english for history.   Her depression and anxiety are being managed at [facility].  She was last seen there about 3 weeks ago and sees them again November 28th.  She feels her depression has slightly improved.  She denies SI/HI.     ROS:   Constitutional:  No f/c, No night sweats, No unexplained weight loss. EENT:  No vision changes, No blurry vision, No hearing changes. No mouth, throat, or ear problems.  Respiratory: No cough, No SOB Cardiac: No CP, no palpitations GI:  No abd pain, No N/V/D. GU: No Urinary s/sx Musculoskeletal: No joint pain Neuro: No headache, no dizziness, no motor weakness.  Skin: +rash Endocrine:  No polydipsia. No polyuria.  Psych: Denies SI/HI  No problems updated.  ALLERGIES: Allergies  Allergen Reactions  . Leflunomide Rash    PAST MEDICAL HISTORY: Past Medical History:  Diagnosis Date  . Blood transfusion without reported diagnosis   . 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 Authorizing Provider  benztropine (COGENTIN) 0.5 MG tablet Take 1 tablet (0.5  mg total) by mouth at bedtime. 01/26/16  Yes Maren Reamer, MD  Cetirizine HCl 10 MG CAPS Take 1 capsule (10 mg total) by mouth daily. 02/21/16 03/22/16 Yes Dionne Bucy Deni Lefever, Kathy Rosales  diphenhydrAMINE (BENADRYL) 25 mg capsule Take 1 capsule (25 mg total) by mouth every 6 (six) hours as needed. 01/26/16  Yes Maren Reamer, MD  DULoxetine (CYMBALTA) 30 MG capsule Take 1 capsule (30 mg total) by mouth daily. 01/26/16  Yes Maren Reamer, MD  ferrous gluconate (FERGON) 324 MG tablet Take 1 tablet (324 mg total) by mouth 2 (two) times daily with a meal. 01/31/16  Yes Maren Reamer, MD  hydrOXYzine (ATARAX/VISTARIL) 25 MG tablet Take 1 tablet by mouth four times daily as needed for anxiety. 01/26/16  Yes Maren Reamer, MD  predniSONE (DELTASONE) 50 MG tablet Take 1 tablet (50 mg total) by mouth daily with breakfast. Take daily 50mg  daily first 10 days, than day 11-20 take 1/2 pill - take in AM. 01/26/16  Yes Maren Reamer, MD  risperiDONE (RISPERDAL) 1 MG tablet Take 1 tablet (1 mg total) by mouth at bedtime. 01/26/16  Yes Dawn Lazarus Gowda, MD  senna-docusate (SENOKOT-S) 8.6-50 MG tablet Take 1 tablet by mouth at bedtime as needed for mild constipation. 01/31/16  Yes Maren Reamer, MD  traZODone (DESYREL) 100 MG tablet Take 1 tablet (100 mg total) by mouth at bedtime. 01/26/16  Yes Dawn T  Langeland, MD  famotidine (PEPCID) 20 MG tablet Take 1 tablet (20 mg total) by mouth 2 (two) times daily. 01/26/16 02/15/16  Maren Reamer, MD  triamcinolone cream (KENALOG) 0.1 % Apply 1 application topically 2 (two) times daily. 02/21/16   Argentina Donovan, Kathy Rosales     Objective:  EXAM:   Vitals:   02/21/16 1448  BP: 102/67  Pulse: 93  Resp: 18  Temp: 99 F (37.2 C)  TempSrc: Oral  SpO2: 99%  Weight: 138 lb 6.4 oz (62.8 kg)  Height: 5\' 1"  (1.549 m)    General appearance : A&OX3. NAD. Non-toxic-appearing HEENT: Atraumatic and Normocephalic.  PERRLA. EOM intact.  TM clear B. Mouth-MMM,  post pharynx WNL w/o erythema, No PND. Throat/airway patent.  No swelling of tongue Neck: supple, no JVD. No cervical lymphadenopathy. No thyromegaly Chest/Lungs:  Breathing-non-labored, Good air entry bilaterally, breath sounds normal without rales, rhonchi, or wheezing  CVS: S1 S2 regular, no murmurs, gallops, rubs  Extremities: Bilateral Lower Ext shows no edema, both legs are warm to touch with = pulse throughout Neurology:  CN II-XII grossly intact, Non focal.   Psych:  TP linear. J/I WNL. Normal speech. Appropriate eye contact and affect.  Skin:  Dyshydrotic changes present B radial aspect of thumbs.  Hives/urticaria around neck.  +dermatographia  Data Review Lab Results  Component Value Date   HGBA1C 5.8 (H) 12/14/2015   HGBA1C 4.3 (L) 09/29/2015     Assessment & Plan   1. Pruritic erythematous rash - Cetirizine HCl 10 MG CAPS; Take 1 capsule (10 mg total) by mouth daily.  Dispense: 30 capsule; Refill: 2 - Ambulatory referral to Dermatology - triamcinolone cream (KENALOG) 0.1 %; Apply 1 application topically 2 (two) times daily.  Dispense: 30 g; Refill: 2  2. MDD (major depressive disorder), single episode, moderate (HCC) Last seen at [facility] 3 weeks ago and has a f/up appt on November 28,2017.  Overall depression has improved some.  She denies SI/HI.  There are no acute safety issues.     Patient have been counseled extensively about nutrition and exercise  Return in about 1 month (around 03/22/2016) for CPE with Dr Janne Napoleon.  The patient was given clear instructions to go to ER or return to medical center if symptoms don't improve, worsen or new problems develop. The patient verbalized understanding. The patient was told to call to get lab results if they haven't heard anything in the next week.     Kathy Caldron, Kathy Rosales [facility] and [place] [employer] [place], [place]   02/21/2016, 3:19 PMPatient ID: Kathy Rosales, female    DOB: 12/01/1975, 40 y.o.   MRN: [id number]

## 2016-04-05 ENCOUNTER — Other Ambulatory Visit: Payer: Self-pay

## 2016-04-05 ENCOUNTER — Encounter: Payer: Self-pay | Admitting: Internal Medicine

## 2016-04-05 ENCOUNTER — Telehealth: Payer: Self-pay | Admitting: Internal Medicine

## 2016-04-05 ENCOUNTER — Ambulatory Visit: Payer: Self-pay | Attending: Internal Medicine | Admitting: Internal Medicine

## 2016-04-05 ENCOUNTER — Ambulatory Visit (HOSPITAL_COMMUNITY)
Admission: RE | Admit: 2016-04-05 | Discharge: 2016-04-05 | Disposition: A | Discharge status: Home / Self Care | Payer: Self-pay | Source: Ambulatory Visit | Attending: Internal Medicine | Admitting: Internal Medicine

## 2016-04-05 VITALS — BP 124/82 | HR 103 | Temp 97.6°F | Resp 16 | Wt 135.4 lb

## 2016-04-05 DIAGNOSIS — F321 Major depressive disorder, single episode, moderate: Secondary | ICD-10-CM | POA: Insufficient documentation

## 2016-04-05 DIAGNOSIS — I517 Cardiomegaly: Secondary | ICD-10-CM | POA: Insufficient documentation

## 2016-04-05 DIAGNOSIS — Z124 Encounter for screening for malignant neoplasm of cervix: Secondary | ICD-10-CM

## 2016-04-05 DIAGNOSIS — M4854XD Collapsed vertebra, not elsewhere classified, thoracic region, subsequent encounter for fracture with routine healing: Secondary | ICD-10-CM | POA: Insufficient documentation

## 2016-04-05 DIAGNOSIS — I10 Essential (primary) hypertension: Secondary | ICD-10-CM | POA: Insufficient documentation

## 2016-04-05 DIAGNOSIS — M4854XA Collapsed vertebra, not elsewhere classified, thoracic region, initial encounter for fracture: Secondary | ICD-10-CM

## 2016-04-05 DIAGNOSIS — Z79899 Other long term (current) drug therapy: Secondary | ICD-10-CM | POA: Insufficient documentation

## 2016-04-05 DIAGNOSIS — D649 Anemia, unspecified: Secondary | ICD-10-CM | POA: Insufficient documentation

## 2016-04-05 DIAGNOSIS — R918 Other nonspecific abnormal finding of lung field: Secondary | ICD-10-CM | POA: Insufficient documentation

## 2016-04-05 DIAGNOSIS — R0602 Shortness of breath: Secondary | ICD-10-CM | POA: Insufficient documentation

## 2016-04-05 DIAGNOSIS — R938 Abnormal findings on diagnostic imaging of other specified body structures: Secondary | ICD-10-CM | POA: Insufficient documentation

## 2016-04-05 DIAGNOSIS — S22000A Wedge compression fracture of unspecified thoracic vertebra, initial encounter for closed fracture: Secondary | ICD-10-CM

## 2016-04-05 DIAGNOSIS — Z888 Allergy status to other drugs, medicaments and biological substances status: Secondary | ICD-10-CM | POA: Insufficient documentation

## 2016-04-05 LAB — CBC WITH DIFFERENTIAL/PLATELET
Basophils Absolute: 0 cells/uL (ref 0–200)
Basophils Relative: 0 %
Eosinophils Absolute: 156 cells/uL (ref 15–500)
Eosinophils Relative: 3 %
HCT: 39.3 % (ref 35.0–45.0)
Hemoglobin: 12.5 g/dL (ref 11.7–15.5)
Lymphocytes Relative: 19 %
Lymphs Abs: 988 cells/uL (ref 850–3900)
MCH: 27 pg (ref 27.0–33.0)
MCHC: 31.8 g/dL — ABNORMAL LOW (ref 32.0–36.0)
MCV: 84.9 fL (ref 80.0–100.0)
MPV: 9.9 fL (ref 7.5–12.5)
Monocytes Absolute: 364 cells/uL (ref 200–950)
Monocytes Relative: 7 %
Neutro Abs: 3692 cells/uL (ref 1500–7800)
Neutrophils Relative %: 71 %
Platelets: 207 10*3/uL (ref 140–400)
RBC: 4.63 MIL/uL (ref 3.80–5.10)
RDW: 16.9 % — ABNORMAL HIGH (ref 11.0–15.0)
WBC: 5.2 10*3/uL (ref 3.8–10.8)

## 2016-04-05 LAB — D-DIMER, QUANTITATIVE: D-Dimer, Quant: 2.32 mcg/mL FEU — ABNORMAL HIGH (ref <0.50)

## 2016-04-05 MED ORDER — ALBUTEROL SULFATE HFA 108 (90 BASE) MCG/ACT IN AERS: 2.0000 | INHALATION_SPRAY | Freq: Four times a day (QID) | RESPIRATORY_TRACT | 2 refills | Status: DC | PRN

## 2016-04-05 MED ORDER — FUROSEMIDE 20 MG PO TABS: 20.0000 mg | ORAL_TABLET | Freq: Every day | ORAL | 0 refills | Status: DC

## 2016-04-05 NOTE — Telephone Encounter (Signed)
Called numerous cell phones, left vm [phone], no answer [phone].   Called home, left message w/ sister to tell pt to call us back  cxr noted, ?chf vs bronchopna - no f/c in clinic. Added bnp to labs. Sx ongoing x 3 wks, less likely infectious.  Trial lasix 20mg  qday x 5 days while await bnp. May need echo.

## 2016-04-05 NOTE — Progress Notes (Signed)
Kathy Rosales, is a 41 y.o. female  [id number]  [id number]  DOB - 06-05-1975  Chief Complaint  Patient presents with  . Hypertension  . Gynecologic Exam        Subjective:   Kathy Rosales is a 41 y.o. female here today for a follow up visit of mdd and papsmear.  Pt has hx of noted T-spine multiple compression fractures on MRI 7/17, had dexa scan ordered 11/11/15 but never done.  Pt co of constant sob last 3wks or so, w/ intermittent pleuritic pain and meg pain.  Denies le edema.  Denies n/v, but feels week, denies syncope/presyncope.  Intermittent dry cough.  No f/c.  Pt has bf with her today and helping her interpret.  She feels better overall, but still not as sexually active as prior.   Denies vag discharge.  Last menses finished last week.    Co of intermittent lower back pain as well, known compressions. Hx of opioid dependence /withdrawal, and does not want to go back to that.   Patient has No headache, No chest pain, No abdominal pain - No Nausea, No new weakness tingling or numbness.  No problems updated.  ALLERGIES: Allergies  Allergen Reactions  . Leflunomide Rash    PAST MEDICAL HISTORY: Past Medical History:  Diagnosis Date  . Blood transfusion without reported diagnosis   . 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 Authorizing Provider  albuterol (PROVENTIL HFA;VENTOLIN HFA) 108 (90 Base) MCG/ACT inhaler Inhale 2 puffs into the lungs every 6 (six) hours as needed for wheezing or shortness of breath. 04/05/16   Maren Reamer, MD  benztropine (COGENTIN) 0.5 MG tablet Take 1 tablet (0.5 mg total) by mouth at bedtime. 01/26/16   Maren Reamer, MD  Cetirizine HCl 10 MG CAPS Take 1 capsule (10 mg total) by mouth daily. 02/21/16 03/22/16  Argentina Donovan, PA-C  diphenhydrAMINE (BENADRYL) 25 mg capsule Take 1 capsule (25 mg total) by mouth every 6  (six) hours as needed. 01/26/16   Maren Reamer, MD  DULoxetine (CYMBALTA) 30 MG capsule Take 1 capsule (30 mg total) by mouth daily. 01/26/16   Maren Reamer, MD  famotidine (PEPCID) 20 MG tablet Take 1 tablet (20 mg total) by mouth 2 (two) times daily. 01/26/16 02/15/16  Maren Reamer, MD  ferrous gluconate (FERGON) 324 MG tablet Take 1 tablet (324 mg total) by mouth 2 (two) times daily with a meal. 01/31/16   Maren Reamer, MD  hydrOXYzine (ATARAX/VISTARIL) 25 MG tablet Take 1 tablet by mouth four times daily as needed for anxiety. 01/26/16   Maren Reamer, MD  predniSONE (DELTASONE) 50 MG tablet Take 1 tablet (50 mg total) by mouth daily with breakfast. Take daily 50mg  daily first 10 days, than day 11-20 take 1/2 pill - take in AM. 01/26/16   Maren Reamer, MD  risperiDONE (RISPERDAL) 1 MG tablet Take 1 tablet (1 mg total) by mouth at bedtime. 01/26/16   Maren Reamer, MD  senna-docusate (SENOKOT-S) 8.6-50 MG tablet Take 1 tablet by mouth at bedtime as needed for mild constipation. 01/31/16   Maren Reamer, MD  traZODone (DESYREL) 100 MG tablet Take 1 tablet (100 mg total) by mouth at bedtime. 01/26/16   Maren Reamer, MD  triamcinolone cream (KENALOG) 0.1 % Apply 1 application topically 2 (two) times daily. 02/21/16   Levada Dy  [place], PA-C     Objective:   Vitals:   04/05/16 1044  BP: 124/82  Pulse: (!) 103  Resp: 16  Temp: 97.6 F (36.4 C)  TempSrc: Oral  SpO2: 97%  Weight: 135 lb 6.4 oz (61.4 kg)    Exam General appearance : Awake, alert, not in any distress. Speech Clear. Not toxic looking, pleasant,  HEENT: Atraumatic and Normocephalic, pupils equally reactive to light. Neck: supple, no JVD.  Chest:Good air entry bilaterally, no added sounds.  No pleuritic pain or reproducible meg pain today.  Breast /axilla: bilat nml appearance, not dippling noted. No palpable masses/nodules/nipple discharge noted on exam  CVS: S1 S2 regular, no murmurs/gallups  or rubs. Abdomen: Bowel sounds active, Non tender and not distended with no gaurding, rigidity or rebound. Pelvic Exam: Cervix normal in appearance, external genitalia normal, no adnexal masses or tenderness, no cervical motion tenderness, rectovaginal septum normal, uterus normal size, shape, and consistency and vagina normal with thick white discharge , no odor.  Extremities: B/L Lower Ext shows no edema, both legs are warm to touch, neg Homan's sign Neurology: Awake alert, and oriented X 3, CN II-XII grossly intact, Non focal Skin:No Rash  Data Review Lab Results  Component Value Date   HGBA1C 5.8 (H) 12/14/2015   HGBA1C 4.3 (L) 09/29/2015    Depression screen PHQ 2/9 04/05/2016 02/21/2016 01/31/2016 01/26/2016  Decreased Interest 2 3 3 3   Down, Depressed, Hopeless 2 3 3 3   PHQ - 2 Score 4 6 6 6   Altered sleeping 3 - (No Data) 3  Tired, decreased energy 3 1 3 3   Change in appetite (No Data) 1 0 0  Feeling bad or failure about yourself  2 3 3 3   Trouble concentrating (No Data) 3 3 3   Moving slowly or fidgety/restless 0 0 3 3  Suicidal thoughts 0 3 1 3   PHQ-9 Score 12 - - 24      Assessment & Plan   1. Pap smear for cervical cancer screening - Cytology - PAP - lasts menses last week.  2. MDD (major depressive disorder), single episode, moderate ([place]) Clinically much improved, continue therapy w/ [facility], meds per [facility].  3. Anemia, unspecified type - as cause of weakness and sob? - chk cbc today.  4. Compression fracture of body of thoracic vertebra (HCC) w. Mild ttp diffuse thoracic spine. - had bone scan ordered by Dr [name] 8/17, but pt never had done, will reorder. - hx of steroid use in past. - DG Bone Density; Future  5. Shortness of breath, etiology unclear, ongoing x 3 wks, lungs pretty clear. See #3 vs bronchitis? (no hx of asthma per pt) vs med s/e ? (review of her trazodone, risperadal, congentin and atarax due not have sob as s/e) vs pe (less  doubtful on ddx though), not hypoxic today. Vs unknown cause/panic /anxiety? - trial abulterol mdi, instructed by Pharmacy on use, appreciate assistance - CBC with Differential - DG Chest 2 View; Future - D-dimer, quantitative (not at [facility])     Patient have been counseled extensively about nutrition and exercise  Return in about 4 weeks (around 05/03/2016) for sob / .  The patient was given clear instructions to go to ER or return to medical center if symptoms don't improve, worsen or new problems develop. The patient verbalized understanding. The patient was told to call to get lab results if they haven't heard anything in the next week.   This note has been created  with Surveyor, quantity. Any transcriptional errors are unintentional.   Maren Reamer, MD, [place] [facility], [place]   04/05/2016, 11:16 AM

## 2016-04-05 NOTE — Patient Instructions (Signed)
Shortness of Breath  Shortness of breath means you have trouble breathing. Shortness of breath needs medical care right away.  HOME CARE   ? Do not smoke.  ? Avoid being around chemicals or things (paint fumes, dust) that may bother your breathing.  ? Rest as needed. Slowly begin your normal activities.  ? Only take medicines as told by your doctor.  ? Keep all doctor visits as told.  GET HELP RIGHT AWAY IF:   ? Your shortness of breath gets worse.  ? You feel lightheaded, pass out (faint), or have a cough that is not helped by medicine.  ? You cough up blood.  ? You have pain with breathing.  ? You have pain in your chest, arms, shoulders, or belly (abdomen).  ? You have a fever.  ? You cannot walk up stairs or exercise the way you normally do.  ? You do not get better in the time expected.  ? You have a hard time doing normal activities even with rest.  ? You have problems with your medicines.  ? You have any new symptoms.  MAKE SURE YOU:  ? Understand these instructions.  ? Will watch your condition.  ? Will get help right away if you are not doing well or get worse.  This information is not intended to replace advice given to you by your health care provider. Make sure you discuss any questions you have with your health care provider.  Document Released: 09/06/2007 Document Revised: 03/25/2013 Document Reviewed: 06/05/2011  Elsevier Interactive Patient Education ? 2017 Elsevier Inc[employer]

## 2016-04-05 NOTE — Addendum Note (Signed)
Addended byLottie Mussel T on: 04/05/2016 12:59 PM   Modules accepted: Orders

## 2016-04-06 ENCOUNTER — Other Ambulatory Visit: Payer: Self-pay

## 2016-04-06 ENCOUNTER — Telehealth: Payer: Self-pay

## 2016-04-06 DIAGNOSIS — I509 Heart failure, unspecified: Secondary | ICD-10-CM

## 2016-04-06 LAB — CERVICOVAGINAL ANCILLARY ONLY
Chlamydia: NEGATIVE
Neisseria Gonorrhea: NEGATIVE
Wet Prep (BD Affirm): NEGATIVE

## 2016-04-06 LAB — BRAIN NATRIURETIC PEPTIDE: Brain Natriuretic Peptide: 1416.4 pg/mL — ABNORMAL HIGH (ref <100)

## 2016-04-06 MED ORDER — TRAZODONE HCL 100 MG PO TABS: 100.0000 mg | ORAL_TABLET | Freq: Every day | ORAL | 0 refills | Status: DC

## 2016-04-06 NOTE — Addendum Note (Signed)
Addended by: Lottie Mussel T on: 04/06/2016 09:10 PM   Modules accepted: Orders

## 2016-04-06 NOTE — Telephone Encounter (Signed)
Pt came into the office today for xray results went over results with patient and went over the lasix to take 1 tablet once a day for 5 days. Pt states she understands. Pt states she is nervous about the fluid in her lungs. I explained to patient to not be nervous that everything will be okay. Just think positive. I informed pt that once we get her blood work back that Dr. Janne Napoleon or myself will be contacting her to over results. Pt states she appreciates it. Pt states she didn't receive her refill for the trazadone for sleep. I informed pt that I will have to ask to see if necessary. Pt states she understands. Went to ask Dr. Doreene Burke showed Dr. Janne Napoleon office note. Per Dr. Doreene Burke it is okay to send to pharmacy. Made pt aware that I will be sending rx for trazadone to the pharmacy and she can pick up today

## 2016-04-06 NOTE — Telephone Encounter (Signed)
Please help pt schedule echo. Ordered. Asap. thanks

## 2016-04-07 ENCOUNTER — Telehealth: Payer: Self-pay

## 2016-04-07 LAB — CYTOLOGY - PAP
Diagnosis: NEGATIVE
HPV: NOT DETECTED

## 2016-04-07 LAB — BRAIN NATRIURETIC PEPTIDE

## 2016-04-07 NOTE — Telephone Encounter (Signed)
Contacted pt and went over her lab results pt is concerned. Pt is schedule for her echo on Jan 10 @1pm  and pt is aware of the appointment

## 2016-04-11 ENCOUNTER — Other Ambulatory Visit: Payer: Self-pay

## 2016-04-12 ENCOUNTER — Ambulatory Visit (HOSPITAL_COMMUNITY)
Admission: RE | Admit: 2016-04-12 | Discharge: 2016-04-12 | Disposition: A | Discharge status: Home / Self Care | Payer: BLUE CROSS/BLUE SHIELD | Source: Ambulatory Visit | Attending: Internal Medicine | Admitting: Internal Medicine

## 2016-04-12 DIAGNOSIS — I340 Nonrheumatic mitral (valve) insufficiency: Secondary | ICD-10-CM | POA: Insufficient documentation

## 2016-04-12 DIAGNOSIS — I509 Heart failure, unspecified: Secondary | ICD-10-CM | POA: Diagnosis not present

## 2016-04-12 NOTE — Progress Notes (Signed)
Echocardiogram 2D Echocardiogram has been performed.  Kathy Rosales 04/12/2016, 2:09 PM

## 2016-04-14 ENCOUNTER — Other Ambulatory Visit: Payer: Self-pay

## 2016-04-14 ENCOUNTER — Encounter: Payer: Self-pay | Admitting: Physician Assistant

## 2016-04-14 ENCOUNTER — Ambulatory Visit: Payer: BLUE CROSS/BLUE SHIELD | Attending: Internal Medicine | Admitting: Physician Assistant

## 2016-04-14 VITALS — BP 106/74 | HR 93 | Temp 98.4°F | Resp 16 | Wt 128.0 lb

## 2016-04-14 DIAGNOSIS — I5189 Other ill-defined heart diseases: Secondary | ICD-10-CM

## 2016-04-14 DIAGNOSIS — R6889 Other general symptoms and signs: Secondary | ICD-10-CM

## 2016-04-14 DIAGNOSIS — L298 Other pruritus: Secondary | ICD-10-CM

## 2016-04-14 DIAGNOSIS — I340 Nonrheumatic mitral (valve) insufficiency: Secondary | ICD-10-CM

## 2016-04-14 DIAGNOSIS — I519 Heart disease, unspecified: Secondary | ICD-10-CM

## 2016-04-14 DIAGNOSIS — I071 Rheumatic tricuspid insufficiency: Secondary | ICD-10-CM | POA: Diagnosis not present

## 2016-04-14 DIAGNOSIS — G4700 Insomnia, unspecified: Secondary | ICD-10-CM

## 2016-04-14 DIAGNOSIS — R9431 Abnormal electrocardiogram [ECG] [EKG]: Secondary | ICD-10-CM | POA: Diagnosis not present

## 2016-04-14 LAB — BASIC METABOLIC PANEL
BUN: 16 mg/dL (ref 7–25)
CO2: 25 mmol/L (ref 20–31)
Calcium: 9.2 mg/dL (ref 8.6–10.2)
Chloride: 108 mmol/L (ref 98–110)
Creat: 0.7 mg/dL (ref 0.50–1.10)
Glucose, Bld: 86 mg/dL (ref 65–99)
Potassium: 4.2 mmol/L (ref 3.5–5.3)
Sodium: 139 mmol/L (ref 135–146)

## 2016-04-14 MED ORDER — TRIAMCINOLONE ACETONIDE 0.1 % EX CREA: 1.0000 "application " | TOPICAL_CREAM | Freq: Two times a day (BID) | CUTANEOUS | 2 refills | Status: DC

## 2016-04-14 MED ORDER — MELATONIN 5 MG PO TABS: ORAL_TABLET | ORAL | 3 refills | Status: DC

## 2016-04-14 MED ORDER — FUROSEMIDE 40 MG PO TABS: 40.0000 mg | ORAL_TABLET | Freq: Every day | ORAL | 1 refills | Status: DC

## 2016-04-14 NOTE — Patient Instructions (Signed)
Melatonin 5mg  1-2 tablets about 1 hour before bedtime

## 2016-04-14 NOTE — Progress Notes (Signed)
Kathy Rosales, is a 41 y.o. female  [id number]  [id number]  DOB - 02/18/1976  Subjective:  Chief Complaint and HPI: Kathy Rosales is a 41 y.o. female here today to go over echocardiogram results.  She has felt better after taking lasix but continue to experience fatigue.   She also has mild SOB, but this has improved some.  Her boyfriend is with her interpreting.  I discussed the results with Kathy. Janne Rosales and the patient.  She continues to have trouble sleeping.  Trazadone is not helping    ROS:   Constitutional:  No f/c, No night sweats, No unexplained weight loss. + fatigue EENT:  No vision changes, No blurry vision, No hearing changes. No mouth, throat, or ear problems.  Respiratory: No cough, +mild SOB Cardiac: No CP, no palpitations GI:  No abd pain, No N/V/D. GU: No Urinary s/sx Musculoskeletal: No joint pain Neuro: No headache, no dizziness, no motor weakness.  Skin: No rash Endocrine:  No polydipsia. No polyuria.  Psych: Denies SI/HI  No problems updated.  ALLERGIES: Allergies  Allergen Reactions  . Leflunomide Rash    PAST MEDICAL HISTORY: Past Medical History:  Diagnosis Date  . Blood transfusion without reported diagnosis   . 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 Authorizing Provider  albuterol (PROVENTIL HFA;VENTOLIN HFA) 108 (90 Base) MCG/ACT inhaler Inhale 2 puffs into the lungs every 6 (six) hours as needed for wheezing or shortness of breath. 04/05/16  Yes Kathy Reamer, MD  benztropine (COGENTIN) 0.5 MG tablet Take 1 tablet (0.5 mg total) by mouth at bedtime. 01/26/16  Yes Kathy Reamer, MD  DULoxetine (CYMBALTA) 30 MG capsule Take 1 capsule (30 mg total) by mouth daily. 01/26/16  Yes Kathy Reamer, MD  triamcinolone cream (KENALOG) 0.1 % Apply 1 application topically 2 (two) times daily. 02/21/16  Yes Kathy Donovan, PA-C    Cetirizine HCl 10 MG CAPS Take 1 capsule (10 mg total) by mouth daily. 02/21/16 03/22/16  Kathy Donovan, PA-C  diphenhydrAMINE (BENADRYL) 25 mg capsule Take 1 capsule (25 mg total) by mouth every 6 (six) hours as needed. Patient not taking: Reported on 04/14/2016 01/26/16   Kathy Reamer, MD  famotidine (PEPCID) 20 MG tablet Take 1 tablet (20 mg total) by mouth 2 (two) times daily. 01/26/16 02/15/16  Kathy Reamer, MD  ferrous gluconate (FERGON) 324 MG tablet Take 1 tablet (324 mg total) by mouth 2 (two) times daily with a meal. Patient not taking: Reported on 04/14/2016 01/31/16   Kathy Reamer, MD  furosemide (LASIX) 40 MG tablet Take 1 tablet (40 mg total) by mouth daily. 04/14/16   Kathy Donovan, PA-C  hydrOXYzine (ATARAX/VISTARIL) 25 MG tablet Take 1 tablet by mouth four times daily as needed for anxiety. Patient not taking: Reported on 04/14/2016 01/26/16   Kathy Reamer, MD  Melatonin 5 MG TABS 1-2 tabs as needed for sleep 04/14/16   Kathy Donovan, PA-C  predniSONE (DELTASONE) 50 MG tablet Take 1 tablet (50 mg total) by mouth daily with breakfast. Take daily 50mg  daily first 10 days, than day 11-20 take 1/2 pill - take in AM. Patient not taking: Reported on 04/05/2016 01/26/16   Kathy Reamer, MD  risperiDONE (RISPERDAL) 1 MG tablet Take 1 tablet (1 mg total) by mouth at bedtime. Patient not taking: Reported on 04/14/2016 01/26/16  Kathy Reamer, MD     Objective:  EXAM:   Vitals:   04/14/16 1018  BP: 106/74  Pulse: 93  Resp: 16  Temp: 98.4 F (36.9 C)  TempSrc: Oral  SpO2: 97%  Weight: 128 lb (58.1 kg)    General appearance : A&OX3. NAD. Non-toxic-appearing HEENT: Atraumatic and Normocephalic.  PERRLA. EOM intact.  TM clear B. Mouth-MMM, post pharynx WNL w/o erythema, No PND. Neck: supple, no JVD. No cervical lymphadenopathy. No thyromegaly.  No JVD Chest/Lungs:  Breathing-non-labored, Good air entry bilaterally, breath sounds normal without rales,  rhonchi, or wheezing  CVS: S1 S2 regular, no murmurs, gallops, rubs  Abdomen: Bowel sounds present, Non tender and not distended with no gaurding, rigidity or rebound. Extremities: Bilateral Lower Ext shows minimal to mild pretibial edema-no pitting, both legs are warm to touch with = pulse throughout Neurology:  CN II-XII grossly intact, Non focal.   Psych:  TP linear. J/I WNL. Normal speech. Appropriate eye contact and affect.  Skin:  No Rash  Data Review Lab Results  Component Value Date   HGBA1C 5.8 (H) 12/14/2015   HGBA1C 4.3 (L) 09/29/2015     Assessment & Plan   1. Diastolic dysfunction Increase Lasix to 40mg  bid for now.  Discussed case with Kathy. Janne Rosales. Abnormal echocardiogram - Ambulatory referral to Cardiology  2. Mitral valve insufficiency, unspecified etiology - Ambulatory referral to Cardiology  3. Tricuspid valve insufficiency, unspecified etiology - Ambulatory referral to Cardiology  4. Hypokinesis - Ambulatory referral to Cardiology  5. Insomnia, unspecified type Stop trazadone - Melatonin 5 MG TABS; 1-2 tabs as needed for sleep  Dispense: 60 tablet; Refill: 3  Patient have been counseled extensively about nutrition and exercise  Return in about 2 weeks (around 04/28/2016) for f/up Kathy Rosales.  The patient was given clear instructions to go to ER or return to medical center if symptoms don't improve, worsen or new problems develop. The patient verbalized understanding. The patient was told to call to get lab results if they haven't heard anything in the next week.     Kathy Caldron, PA-C [facility] [place], [place]   04/14/2016, 11:07 AMPatient ID: Kathy Rosales, female   DOB: [birth year], 41 y.o.   MRN: [id number]

## 2016-04-14 NOTE — Progress Notes (Signed)
Complaining of joint pain, finger edema/pain and back pain. Itching intermittent: legs/back.Hydroxizine did not help. Side effects of medication, decrease sexual drive. Trazadone not helping with sleeping.

## 2016-04-21 ENCOUNTER — Ambulatory Visit: Payer: Self-pay

## 2016-04-21 ENCOUNTER — Telehealth: Payer: Self-pay

## 2016-04-21 NOTE — Telephone Encounter (Signed)
Contacted pt to go over lab results pt is aware of results and doesn't have any questions or concerns 

## 2016-05-08 NOTE — Progress Notes (Signed)
Referring: Lottie Mussel MD  HPI: 41 year old female for evaluation of CHF and mitral regurgitation. Patient seen January 3 by primary care with complaints of dyspnea and echocardiogram ordered. Echocardiogram January 2018 showed ejection fraction 25-30%, moderate left ventricular enlargement, grade 3 diastolic dysfunction, moderate to severe mitral regurgitation, severe left atrial enlargement, moderate right ventricular enlargement and moderate tricuspid regurgitation. Chest x-ray January 2018 showed cardiomegaly and mild edema. BNP 1416. Patient states that for the past 3 months she has had progressive dyspnea on exertion and orthopnea. No pedal edema, palpitations or syncope. She has had some chest tightness with laying flat improved with sitting up. Because of the above we were asked to evaluate. Note her symptoms have improved with addition of Lasix but not back to baseline.  Current Outpatient Prescriptions  Medication Sig Dispense Refill  . albuterol (PROVENTIL HFA;VENTOLIN HFA) 108 (90 Base) MCG/ACT inhaler Inhale 2 puffs into the lungs every 6 (six) hours as needed for wheezing or shortness of breath. 1 Inhaler 2  . furosemide (LASIX) 20 MG tablet Take 1 tablet (20 mg total) by mouth daily. 30 tablet 1  . Melatonin 5 MG TABS 1-2 tabs as needed for sleep 60 tablet 3  . PARoxetine (PAXIL) 20 MG tablet Take 20 mg by mouth daily.    . riboflavin (VITAMIN B-2) 100 MG TABS tablet Take 100 mg by mouth daily.    . traZODone (DESYREL) 100 MG tablet Take 200 mg by mouth at bedtime.    . triamcinolone cream (KENALOG) 0.1 % Apply 1 application topically 2 (two) times daily. 80 g 2   No current facility-administered medications for this visit.     Allergies  Allergen Reactions  . Leflunomide Rash     Past Medical History:  Diagnosis Date  . Blood transfusion without reported diagnosis   . 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 COLONOSCOPY WITH  PROPOFOL N/A 07/23/2015   Procedure: COLONOSCOPY WITH PROPOFOL;  Surgeon: Ronald Lobo, MD;  Location: [facility];  Service: Endoscopy;  Laterality: N/A;  . ESOPHAGOGASTRODUODENOSCOPY (EGD) WITH PROPOFOL N/A 07/23/2015   Procedure: ESOPHAGOGASTRODUODENOSCOPY (EGD) WITH PROPOFOL;  Surgeon: Ronald Lobo, MD;  Location: [facility];  Service: Endoscopy;  Laterality: N/A;  . ESOPHAGOGASTRODUODENOSCOPY (EGD) WITH PROPOFOL N/A 11/18/2015   Procedure: ESOPHAGOGASTRODUODENOSCOPY (EGD) WITH PROPOFOL;  Surgeon: Ronald Lobo, MD;  Location: [facility];  Service: Endoscopy;  Laterality: N/A;    Social History   Social History  . Marital status: Married    Spouse name: N/A  . Number of children: 1  . Years of education: N/A   Occupational History  . Not on file.   Social History Main Topics  . Smoking status: Former Research scientist (life sciences)  . Smokeless tobacco: Never Used  . Alcohol use No  . Drug use: No  . Sexual activity: No   Other Topics Concern  . Not on file   Social History Narrative   ** Merged History Encounter **        Family History  Problem Relation Age of Onset  . Cancer Paternal Grandfather   . Mental illness Neg Hx     ROS: no fevers or chills, productive cough, hemoptysis, dysphasia, odynophagia, melena, hematochezia, dysuria, hematuria, rash, seizure activity, orthopnea, PND, pedal edema, claudication. Remaining systems are negative.  Physical Exam:   Blood pressure 100/60, pulse 70, weight 124 lb 12.8 oz (56.6 kg).  General:  Well developed/well nourished in NAD Skin warm/dry Patient not depressed  No peripheral clubbing Back-normal HEENT-normal/normal eyelids Neck supple/normal carotid upstroke bilaterally; no JVD; no thyromegaly chest - CTA/ normal expansion CV - RRR/normal S1 and S2; no rubs or gallops;  PMI nondisplaced, 3/6 systolic murmur apex Abdomen -NT/ND, no HSM, no mass, + bowel sounds, no bruit 2+ femoral pulses, no bruits Ext-no edema, chords, 2+  DP Neuro-grossly nonfocal  ECG 04/14/2016-sinus rhythm, nonspecific ST changes, prolonged QT interval.  A/P  1 acute systolic congestive heart failure-patient remains mildly symptomatic following addition of Lasix 20 mg daily. We will continue with that dose and add spironolactone 25 mg daily.  2 cardiomyopathy-etiology unclear. She does not have a history of hypertension or alcohol abuse. No history of recent viral infections. Check TSH. I will arrange a cardiac catheterization to exclude coronary disease. The risks and benefits including myocardial infarction, CVA and death discussed and she agrees to proceed. Add lisinopril 2.5 mg daily. Check potassium and renal function in 1 week. I will have her return in 4 weeks and we will add carvedilol if blood pressure allows. Note her systolic blood pressure is 100 and I am hesitant to add beta blocker at this point. She will need repeat echocardiogram in 3 months after medications titrated. If ejection fraction less than 35% would need to consider ICD.   3 mitral regurgitation-I have personally reviewed the patient's echocardiogram. It appears to be severe. This may be related to reduced LV function/left ventricular dilatation. We will plan follow-up echocardiogram once medications titrated. If mitral regurgitation remains severe with need to consider transesophageal echocardiogram to further evaluate mitral valve morphology.  Kirk Ruths, MD

## 2016-05-09 ENCOUNTER — Encounter: Payer: Self-pay | Admitting: Internal Medicine

## 2016-05-09 ENCOUNTER — Ambulatory Visit: Payer: BLUE CROSS/BLUE SHIELD | Attending: Internal Medicine | Admitting: Internal Medicine

## 2016-05-09 VITALS — BP 120/83 | HR 102 | Temp 97.6°F | Resp 16 | Wt 131.8 lb

## 2016-05-09 DIAGNOSIS — F529 Unspecified sexual dysfunction not due to a substance or known physiological condition: Secondary | ICD-10-CM

## 2016-05-09 DIAGNOSIS — R37 Sexual dysfunction, unspecified: Secondary | ICD-10-CM | POA: Insufficient documentation

## 2016-05-09 DIAGNOSIS — F329 Major depressive disorder, single episode, unspecified: Secondary | ICD-10-CM | POA: Diagnosis not present

## 2016-05-09 DIAGNOSIS — I5040 Unspecified combined systolic (congestive) and diastolic (congestive) heart failure: Secondary | ICD-10-CM | POA: Insufficient documentation

## 2016-05-09 DIAGNOSIS — M0820 Juvenile rheumatoid arthritis with systemic onset, unspecified site: Secondary | ICD-10-CM | POA: Insufficient documentation

## 2016-05-09 DIAGNOSIS — Z888 Allergy status to other drugs, medicaments and biological substances status: Secondary | ICD-10-CM | POA: Diagnosis not present

## 2016-05-09 DIAGNOSIS — I5041 Acute combined systolic (congestive) and diastolic (congestive) heart failure: Secondary | ICD-10-CM

## 2016-05-09 DIAGNOSIS — F321 Major depressive disorder, single episode, moderate: Secondary | ICD-10-CM | POA: Diagnosis not present

## 2016-05-09 DIAGNOSIS — G4700 Insomnia, unspecified: Secondary | ICD-10-CM | POA: Diagnosis not present

## 2016-05-09 DIAGNOSIS — Z79899 Other long term (current) drug therapy: Secondary | ICD-10-CM | POA: Insufficient documentation

## 2016-05-09 LAB — CMP AND LIVER
ALT: 54 U/L — ABNORMAL HIGH (ref 6–29)
AST: 46 U/L — ABNORMAL HIGH (ref 10–30)
Albumin: 3.9 g/dL (ref 3.6–5.1)
Alkaline Phosphatase: 58 U/L (ref 33–115)
BUN: 14 mg/dL (ref 7–25)
Bilirubin, Direct: 0.1 mg/dL (ref <=0.2)
CO2: 23 mmol/L (ref 20–31)
Calcium: 8.6 mg/dL (ref 8.6–10.2)
Chloride: 110 mmol/L (ref 98–110)
Creat: 0.71 mg/dL (ref 0.50–1.10)
Glucose, Bld: 101 mg/dL — ABNORMAL HIGH (ref 65–99)
Indirect Bilirubin: 0.3 mg/dL (ref 0.2–1.2)
Potassium: 3.9 mmol/L (ref 3.5–5.3)
Sodium: 140 mmol/L (ref 135–146)
Total Bilirubin: 0.4 mg/dL (ref 0.2–1.2)
Total Protein: 5.9 g/dL — ABNORMAL LOW (ref 6.1–8.1)

## 2016-05-09 MED ORDER — FUROSEMIDE 20 MG PO TABS: 20.0000 mg | ORAL_TABLET | Freq: Every day | ORAL | 1 refills | Status: DC

## 2016-05-09 NOTE — Progress Notes (Signed)
Kathy Rosales, is a 41 y.o. female  [id number]  [id number]  DOB - 04-10-1975  Chief Complaint  Patient presents with  . Sexual Problem        Subjective:   Kathy Rosales is a 41 y.o. female here today for a follow up visit for new onset combined systolic and diastolic chf, MDD, and c/o of sexual dysfunction. Pt states she is feeling better overall, denies chest pain/syncope.  States she sleeps w/ 4 pillows, but that has been unchanged since all the breathing issues. Denies pnd.  Of note, she can walk longer distances now w/o getting extremely dyspnea. She is done w/ lasix. Denies le edema.  She is here w/ her bf, who is very concerned w/ her decrease sexual drive. Pt recently saw [facility] psyche, only as aterax prn now, and started on trazodone 200mg  qhs for insomnia - which she has not picked up/started yet.  She does drink coffee in afternoons.    Patient has No headache, No chest pain, No abdominal pain - No Nausea, No new weakness tingling or numbness, No Cough - SOB.  No problems updated.  ALLERGIES: Allergies  Allergen Reactions  . Leflunomide Rash    PAST MEDICAL HISTORY: Past Medical History:  Diagnosis Date  . Blood transfusion without reported diagnosis   . 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)     MEDICATIONS AT HOME: Prior to Admission medications   Medication Sig Start Date End Date Taking? Authorizing Provider  traZODone (DESYREL) 100 MG tablet Take 200 mg by mouth at bedtime.   Yes Historical Provider, MD  albuterol (PROVENTIL HFA;VENTOLIN HFA) 108 (90 Base) MCG/ACT inhaler Inhale 2 puffs into the lungs every 6 (six) hours as needed for wheezing or shortness of breath. 04/05/16   Maren Reamer, MD  Cetirizine HCl 10 MG CAPS Take 1 capsule (10 mg total) by mouth daily. 02/21/16 03/22/16  Argentina Donovan, PA-C  diphenhydrAMINE (BENADRYL) 25 mg capsule Take 1 capsule (25 mg total) by mouth every 6 (six) hours as needed. Patient not taking: Reported  on 04/14/2016 01/26/16   Maren Reamer, MD  famotidine (PEPCID) 20 MG tablet Take 1 tablet (20 mg total) by mouth 2 (two) times daily. 01/26/16 02/15/16  Maren Reamer, MD  ferrous gluconate (FERGON) 324 MG tablet Take 1 tablet (324 mg total) by mouth 2 (two) times daily with a meal. Patient not taking: Reported on 04/14/2016 01/31/16   Maren Reamer, MD  furosemide (LASIX) 20 MG tablet Take 1 tablet (20 mg total) by mouth daily. 05/09/16   Maren Reamer, MD  hydrOXYzine (ATARAX/VISTARIL) 25 MG tablet Take 1 tablet by mouth four times daily as needed for anxiety. Patient not taking: Reported on 04/14/2016 01/26/16   Maren Reamer, MD  Melatonin 5 MG TABS 1-2 tabs as needed for sleep 04/14/16   Argentina Donovan, PA-C  triamcinolone cream (KENALOG) 0.1 % Apply 1 application topically 2 (two) times daily. 04/14/16   Argentina Donovan, PA-C     Objective:   Vitals:   05/09/16 0858  BP: 120/83  Pulse: (!) 102  Resp: 16  Temp: 97.6 F (36.4 C)  TempSrc: Oral  SpO2: 98%  Weight: 131 lb 12.8 oz (59.8 kg)    Exam General appearance : Awake, alert, not in any distress. Speech Clear. Not toxic looking, obese, plesant HEENT: Atraumatic and Normocephalic, pupils equally reactive to light. Neck: supple, no JVD. Chest:Good air entry bilaterally, no added sounds. CVS: S1 S2  regular, no murmurs/gallups or rubs. Abdomen: Bowel sounds active, Non tender and not distended with no gaurding, rigidity or rebound. Extremities: B/L Lower Ext shows trace edema, both legs are warm to touch Neurology: Awake alert, and oriented X 3, CN II-XII grossly intact, Non focal Skin:No Rash  Data Review Lab Results  Component Value Date   HGBA1C 5.8 (H) 12/14/2015   HGBA1C 4.3 (L) 09/29/2015    Depression screen PHQ 2/9 05/09/2016 04/14/2016 04/14/2016 04/05/2016 02/21/2016  Decreased Interest 2 0 1 2 3   Down, Depressed, Hopeless 1 0 2 2 3   PHQ - 2 Score 3 0 3 4 6   Altered sleeping 3 1 3 3  -  Tired,  decreased energy 3 1 3 3 1   Change in appetite 2 1 2  (No Data) 1  Feeling bad or failure about yourself  0 0 0 2 3  Trouble concentrating 1 0 1 (No Data) 3  Moving slowly or fidgety/restless 0 0 0 0 0  Suicidal thoughts 0 0 0 0 3  PHQ-9 Score 12 3 12 12  -      Assessment & Plan   1. 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 - defer to cards - trace edema bilat le today, but lungs clear/no jvd. - lasix 20 qd for now - advised to keep Cards appt 2/12 at 8am appt, - CMP and Liver - Brain natriuretic peptide  2. MDD (major depressive disorder), single episode, moderate ([place]) - meds reviewed, only on hydroxyzine prn, off congentin and risperdal now.   3. Insomnia, unspecified type Pt was started on trazodone recently by [facility] for insomnia, but has not picked up rx yet. Noted s/e included qt prolongation and sexual dysfuction.  Advised her not to start at this time, due to concerns of chf and sex dysfunction. - better sleep hygeine recd, no coffee in afternoons,  - prn melatonin safest for now.  4. 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 - trazodone will affect this more.     Patient have been counseled extensively about nutrition and exercise  Return in about 2 months (around 07/07/2016) for heart failure.  The patient was given clear instructions to go to ER or return to medical center if symptoms don't improve, worsen or new problems develop. The patient verbalized understanding. The patient was told to call to get lab results if they haven't heard anything in the next week.   This note has been created with Surveyor, quantity. Any transcriptional errors are unintentional.   Maren Reamer, MD, [place] and [facility], [place]   05/09/2016, 9:25 AM

## 2016-05-09 NOTE — Patient Instructions (Addendum)
Avoid alcohol and tobacco smoke;   Insuficiencia cardaca (Heart Failure) Insuficiencia cardaca significa que el corazn tiene problemas para bombear la [place]. Esto dificulta el buen funcionamiento del organismo. La insuficiencia cardaca es una enfermedad de larga duracin (crnica). Es importante que se cuide mucho y que siga el plan de tratamiento que le indique el mdico. CUIDADOS EN EL [employer] medicamentos para el corazn tal como se los prescribi el mdico.  No deje de tomar medicamentos excepto que su mdico lo indique  No se saltee ninguna dosis del medicamento.  Provase de los medicamentos antes de que se acaben.  Tome medicamentos slo como lo indique su mdico o Development worker, international aid.  Permanezca activo si el mdico lo indica. Las Financial trader y los que tengan insuficiencia cardaca grave deben hablar con el mdico acerca de la actividad fsica.  Consuma alimentos saludables para el corazn. Elija alimentos que no contengan grasas trans y sean bajas en grasas saturadas, colesterol y sal (sodio). Esto incluye frutas frescas o congeladas y vegetales, pescado, carnes [place], productos lcteos sin grasa o bajos en grasa, granos enteros y alimentos ricos en fibra. Las lentejas, arvejas y frijoles (legumbres) son tambin buenas opciones.  Limitar el consumo de sal segn lo aconsejado por su mdico.  Cocine en forma saludable. Prepare los alimentos asados, a la parrilla, al horno, hervidos, al vapor o salteados.  Limite los lquidos segn lo aconsejado por su mdico.  Controle su peso todas las [place]. Hgalo despus de hacer pis (orinar) y antes de tomar el desayuno. Anote su peso para llevarlo a la consulta con el mdico.  Tmese la presin arterial y antela, si su mdico se lo indica.  Pregunte al mdico como controlarse el pulso. Controle su pulso segn las indicaciones.  Baje de peso si el mdico se lo indica.  Deje de fumar o mascar tabaco. No use goma de  Higher education careers adviser o parches para dejar de fumar sin la aprobacin de su mdico.  Programe y concurra a las citas con el mdico segn lo indicado.  Las mujeres no embarazadas no deben tomar ms de 1 bebida al [employer]. Los hombres no deben tomar ms de 2 bebidas al [employer]. Hable con su mdico acerca de su consumo de alcohol.  No consuma drogas.  [place] (immunizaciones).  Controle sus enfermedades segn lo indicado por su mdico.  Aprenda a Engineer, maintenance (IT).  Descanse cuando se sienta cansado.  Si hace mucho calor en el exterior:  Evite las actividades intensas.  Utilice aire acondicionado o ventiladores, o pngase en un lugar ms fresco.  Evite la cafena y el alcohol.  Use ropa holgada, ligera y de colores claros.  Si hace mucho fro en el exterior:  Evite las actividades intensas.  Vstase con ropa en capas.  Use mitones o guantes, un sombrero y [place] bufanda cuando salga.  Evite el alcohol.  Aprenda todo sobre la insuficiencia cardaca y [place] apoyo si lo necesita.  Obtenga ayuda para mantener o mejorar su calidad de vida y su capacidad para cuidarse a s mismo, si lo necesita. SOLICITE AYUDA SI:  Aumenta de peso rpidamente.  Le falta el aire ms que lo habitual.  No puede hacer sus actividades habituales.  Se cansa con facilidad.  Tose ms de lo normal, especialmente al realizar actividad fsica.  Observa que se le hinchan o le aumenta la hinchazn (inflamacin) en reas como las manos, los pies, los tobillos o el vientre (abdomen).  Marin Comment cuesta  dormir debido a Printmaker.  Siente que el corazn palpita rpido (palpitaciones).  Siente mareos o vahdos al pararse. SOLICITE AYUDA DE INMEDIATO SI:  Tiene dificultad para respirar.  Hay un cambio en su estado mental, como estar menos alerta o no poder concentrarse.  Siente dolor u opresin en el pecho.  Se desmaya. ASEGRESE DE QUE:  Comprende estas  instrucciones.  Controlar su enfermedad.  Solicitar ayuda de inmediato si no mejora o si empeora. Esta informacin no tiene Marine scientist el consejo del mdico. Asegrese de hacerle al mdico cualquier pregunta que tenga. Document Released: 06/16/2008 Document Revised: 04/10/2014 Document Reviewed: 05/06/2012 Elsevier Interactive Patient Education  2017 [place] de alimentacin con bajo contenido de sodio (Low-Sodium Eating Plan) El sodio aumenta la presin arterial y hace que el cuerpo retenga lquidos. El consumo de alimentos con menos sodio ayuda a Armed forces technical officer presin arterial, a Forensic psychologist y a Tour manager, el hgado y los riones. Agregar sal (cloruro de sodio) a los alimentos aumenta el aporte de [place]. La mayor parte del sodio proviene de los alimentos enlatados, envasados y congelados. La pizza, la comida rpida y la comida de los restaurantes tambin contienen mucho sodio. Aunque usted tome medicamentos para bajar la presin arterial o reducir el lquido del cuerpo, es importante que disminuya el aporte de sodio de los alimentos. EN QU CONSISTE EL PLAN? La mayora de las personas deberan limitar la ingesta de sodio a 2300mg  por Training and development officer. El mdico le recomienda que limite su consumo de sodio a 000mg  por Training and development officer. QU DEBO SABER ACERCA DE ESTE PLAN DE [place]? Para el plan de alimentacin con bajo contenido de sodio, debe seguir estas pautas generales:  Elija alimentos con un valor porcentual diario de sodio de menos del 5% (segn se indica en la etiqueta).  Use hierbas o aderezos sin sal, en lugar de sal de mesa o sal marina.  Consulte al mdico o farmacutico antes de usar sustitutos de la sal.  Coma alimentos frescos.  Coma ms frutas y verduras.  Limite las verduras enlatadas. Si las consume, enjuguelas bien para disminuir el sodio.  Limite el consumo de queso a 1onza (28g) por Training and development officer.  Coma productos con bajo contenido de  sodio, cuya etiqueta suele decir "bajo contenido de sodio" o "sin agregado de sal".  Evite alimentos que contengan glutamato monosdico (MSG), que a veces se agrega a la comida [place] y a algunos alimentos enlatados.  Consulte las etiquetas de los alimentos (etiquetas de informacin nutricional) para saber cunto sodio contiene una porcin.  Consuma ms comida casera y menos de restaurante, de buf y comida rpida.  Cuando coma en un restaurante, pida que preparen su comida con menos sal o, en lo posible, sin nada de sal. CMO LEO LA INFORMACIN SOBRE EL SODIO EN LAS ETIQUETAS DE LOS ALIMENTOS? La etiqueta de informacin nutricional indica la cantidad de sodio en una porcin de alimento. Si come ms de una porcin, debe multiplicar la cantidad indicada de sodio por la cantidad de porciones. Las etiquetas de los alimentos tambin pueden indicar lo siguiente:  Sin sodio: menos de 5mg  por porcin.  Cantidad muy baja de sodio: 35mg  o menos por porcin.  Cantidad baja de sodio: 140mg  o menos por porcin.  Menor cantidad de sodio: 50% menos de sodio en una porcin. Por ejemplo, si un alimento generalmente contiene 300 mg de sodio se modifica para ser [employer], tendr 150 mg  de sodio.  Sodio reducido: 25% menos de Agricultural consultant. Por ejemplo, si un alimento que por lo general contiene 400mg  de sodio se modifica para convertirse en un alimento de sodio reducido, tendr 300mg  de sodio. QU ALIMENTOS PUEDO COMER? Cereales  Cereales con bajo contenido de sodio, como [place], arroz y trigo [place], y trigo triturado. Galletas con bajo contenido de [place]. Arroz y pastas sin sal. Pan con bajo contenido de [place]. Verduras  Verduras frescas o congeladas. Verduras enlatadas con bajo contenido de sodio o reducido de sodio. Pasta y salsa de tomate con contenido bajo o [place]. Jugos de tomate y verduras con contenido bajo o reducido de sodio. [place] Mody  Frutas frescas,  congeladas y IT sales professional. Jugo de frutas. Carnes y otros productos con protenas  Atn y salmn enlatado con bajo contenido de [place]. Carne de vaca o ave, pescado y frutos de mar frescos o congelados. Cordero. Frutos secos sin sal. Lentejas, frijoles y guisantes secos, sin sal agregada. Frijoles enlatados sin sal. Sopas caseras sin sal. Huevos. Lcteos  Leche. Leche de soja. Queso ricota. Quesos con contenido bajo o reducido de sodio. Yogur. Condimentos  Hierbas y especias frescas y secas. Aderezos sin sal. [place] y ajo en polvo. Variedades de [place] y ketchup con bajo contenido de sodio. Rbano picante fresco o refrigerado. Jugo de limn. Grasas y aceites  Aderezos para ensalada con contenido reducido de [place]. Mantequilla sin sal. Otros  Palomitas de maz y pretzels sin sal. Los artculos mencionados arriba pueden no ser [employer] de las bebidas o los alimentos recomendados. Comunquese con el nutricionista para conocer ms opciones.  QU ALIMENTOS NO SE RECOMIENDAN? Cereales  Cereales instantneos para comer caliente. Mezclas para bizcochos, panqueques y rellenos de pan. Crutones. Mezclas para pastas o arroz con condimento. Envases comerciales de sopa de fideos. Macarrones con queso envasados o congelados. Harina leudante. Galletas saladas comunes. Verduras  Verduras enlatadas comunes. Pasta y salsa de tomate en lata comunes. Jugos comunes de tomate y de verduras. Verduras Surveyor, minerals. Papas fritas saladas. Aceitunas. Pepinillos. Salsas. Chucrut. Salsa. Carnes y otros productos con protenas  Carne de vaca, pescado o frutos de mar que est salada, [place], [place], condimentada con especias o con pickles. Panceta, jamn, salchichas, perros calientes, carne curada, carne picada (carne envasada de buey) y embutidos. Cerdo salado. Cecina o charqui. Arenque en escabeche. Anchoas, atn enlatado comn y sardinas. Frutos secos con sal. Ines [place] para untar y quesos  procesados. Requesn. Queso azul y cottage. Suero de [place]. Condimentos  Sal de cebolla y ajo, sal condimentada, sal de mesa y sal marina. Salsas en lata y envasadas. Salsa [place]. Salsa trtara. Salsa barbacoa. Salsa teriyaki. Salsa de soja, incluso la que tiene contenido reducido de [place]. Salsa de carne. Salsa de pescado. Salsa de [place]. Salsa rosada. Rbano picante envasado. Ketchup y mostaza comunes. Saborizantes y tiernizantes para carne. Caldo en cubitos. Salsa picante. Salsa tabasco. Adobos. Aderezos para tacos. Salsas. Grasas y aceites  Aderezos comunes para ensalada. Cundiyo con sal. Margarina. Mantequilla clarificada. Grasa de panceta. Otros  Nachos y papas fritas envasadas. Maz inflado y frituras de maz. Palomitas de maz y pretzels con sal. Sopas enlatadas o en polvo. Pizza. Pasteles y entradas congeladas. Los artculos mencionados arriba pueden no ser [employer] de las bebidas y los alimentos que se Higher education careers adviser. Comunquese con el nutricionista para obtener ms informacin.  Esta informacin no tiene Marine scientist el consejo del mdico. Asegrese de hacerle al mdico  cualquier pregunta que tenga. Document Released: 03/20/2005 Document Revised: 04/10/2014 Document Reviewed: 01/22/2013 Elsevier Interactive Patient Education  2017 [place] (Insomnia) El insomnio es un trastorno del sueo que causa dificultades para conciliar el sueo o para [place]. Puede producir cansancio (fatiga), falta de energa, dificultad para concentrarse, cambios en el estado de nimo y mal rendimiento escolar o laboral. Hay tres formas diferentes de clasificar el insomnio:  Dificultad para conciliar el sueo.  Dificultad para mantener el sueo.  Despertar muy precoz por la maana. Cualquier tipo de insomnio puede ser a Barrister's clerk (crnico) o a Control and instrumentation engineer (agudo). Ambos son frecuentes. Generalmente, el insomnio a corto plazo dura tres meses o menos tiempo.  El crnico ocurre al menos tres veces por semana durante ms de tres meses. CAUSAS El insomnio puede deberse a otra afeccin, situacin o sustancia, por ejemplo:  Ansiedad.  Algunos medicamentos.  Enfermedad por reflujo gastroesofgico (ERGE) u otras enfermedades gastrointestinales.  Asma y otras enfermedades respiratorias.  Sndrome de las piernas inquietas, apnea del sueo u otros trastornos del sueo.  Dolor crnico.  Menopausia, que puede incluir calores repentinos.  Ictus.  Consumo excesivo de alcohol, tabaco u drogas ilegales.  Depresin.  Cafena.  Trastornos neurolgicos, como enfermedad de Alzheimer.  Hiperactividad tiroidea (hipertiroidismo). Es posible que la causa del insomnio no se conozca. FACTORES DE RIESGO Los factores de riesgo de tener insomnio incluyen lo siguiente:  El sexo. La mujeres se ven ms afectadas que los hombres.  La edad. El insomnio es ms frecuente a medida que una persona envejece.  El estrs. Esto puede incluir su vida profesional o personal.  Los ingresos. El insomnio es ms frecuente en las personas cuyos ingresos son ms bajos.  La falta de actividad fsica.  Los horarios de trabajo irregulares o los turnos nocturnos.  Los viajes a lugares de diferentes zonas horarias. SIGNOS Y SNTOMAS Si tiene insomnio, el sntoma principal es la dificultad para conciliar el sueo o mantenerlo. Esto puede derivar en otros sntomas, por ejemplo:  Sentirse fatigado.  Ponerse nervioso por [employer] irse a dormir.  No sentirse descansado por la maana.  Tener dificultad para concentrarse.  Sentirse irritable, ansioso o deprimido. TRATAMIENTO El tratamiento para el insomnio depende de la causa. Si se debe a una enfermedad preexistente, el tratamiento se centrar en el abordaje de la enfermedad. El tratamiento tambin puede incluir lo siguiente:  Medicamentos que lo ayuden a dormir.  Asesoramiento psicolgico o terapia.  Cambios en el  estilo de vida. [place] los medicamentos solamente como se lo haya indicado el mdico.  Establezca horarios habituales para dormir y Clinical cytogeneticist. No tome siestas.  Lleve un registro del sueo ya que podra ser de utilidad para que usted y a su mdico puedan determinar qu podra estar causndole insomnio. Incluya lo siguiente:  Cundo duerme.  Cundo se despierta durante la noche.  Qu tan bien duerme.  Qu tan relajado se siente al da siguiente.  Cualquier efecto secundario de los medicamentos que toma.  Lo que usted come y bebe.  Convierta a su habitacin en un lugar cmodo donde sea fcil conciliar el sueo:  Coloque persianas o cortinas especiales oscuras que impidan la entrada de la luz del exterior.  Para bloquear los ruidos, use un aparato que reproduce sonidos ambientales o relajantes de fondo.  Mantenga baja la temperatura.  Haga ejercicio regularmente como se lo haya indicado el mdico. No haga ejercicio justo antes de la  hora de acostarse.  Utilice tcnicas de relajacin para controlar el estrs. Pdale al mdico que le sugiera algunas tcnicas que sean adecuadas para usted. Estos pueden incluir lo siguiente:  Ejercicios de respiracin.  Rutinas para aliviar la tensin muscular.  Visualizacin de escenas apacibles.  Disminucin del consumo de alcohol, bebidas con cafena y cigarrillos, especialmente cerca de la hora de [place], ya que pueden perturbarle el sueo.  No coma en exceso ni consuma comidas picantes justo antes de la hora de [place]. Esto puede causarle molestias digestivas y dificultades para dormir.  Limite el uso de pantallas antes de la hora de [place]. Esto incluye lo siguiente:  Mirar televisin.  Usar el telfono inteligente, la tableta y la computadora.  Siga una rutina. Esto puede ayudarlo a conciliar el sueo ms rpidamente. Intente hacer una actividad tranquila, cepillarse los dientes e  irse a la cama a la misma hora todas las noches.  Levntese de la cama si sigue despierto despus de 7minutos de haber intentado dormirse. [place] luces, pero intente leer o hacer una actividad tranquila. Cuando tenga sueo, regrese a Futures trader.  Conduzca con cuidado. No conduzca si est muy somnoliento.  Concurra a todas las visitas de control, segn le indique su mdico. Esto es importante. SOLICITE ATENCIN MDICA SI:  Est cansado durante el da o tiene dificultades en su rutina diaria debido a la somnolencia.  Sigue teniendo problemas para dormir o Press photographer. SOLICITE ATENCIN MDICA DE INMEDIATO SI:  Tiene pensamientos serios acerca de lastimarse a usted mismo o daar a Nurse, children's. Esta informacin no tiene Marine scientist el consejo del mdico. Asegrese de hacerle al mdico cualquier pregunta que tenga. Document Released: 03/20/2005 Document Revised: 04/10/2014 Document Reviewed: 12/19/2013 Elsevier Interactive Patient Education  2017 [employer].

## 2016-05-10 LAB — BRAIN NATRIURETIC PEPTIDE: Brain Natriuretic Peptide: 1340.7 pg/mL — ABNORMAL HIGH (ref <100)

## 2016-05-11 ENCOUNTER — Telehealth: Payer: Self-pay

## 2016-05-11 NOTE — Telephone Encounter (Signed)
Pt returned call and went over lab results pt is aware of results and doesn't have any questions or concerns

## 2016-05-11 NOTE — Telephone Encounter (Signed)
Contacted pt to go over lab results pt didn't answer and was unable to lvm  

## 2016-05-15 ENCOUNTER — Encounter: Payer: Self-pay | Admitting: *Deleted

## 2016-05-15 ENCOUNTER — Encounter: Payer: Self-pay | Admitting: Cardiology

## 2016-05-15 ENCOUNTER — Ambulatory Visit (INDEPENDENT_AMBULATORY_CARE_PROVIDER_SITE_OTHER): Payer: BLUE CROSS/BLUE SHIELD | Admitting: Cardiology

## 2016-05-15 VITALS — BP 100/60 | HR 70 | Wt 124.8 lb

## 2016-05-15 DIAGNOSIS — I340 Nonrheumatic mitral (valve) insufficiency: Secondary | ICD-10-CM | POA: Diagnosis not present

## 2016-05-15 DIAGNOSIS — I509 Heart failure, unspecified: Secondary | ICD-10-CM | POA: Diagnosis not present

## 2016-05-15 DIAGNOSIS — I429 Cardiomyopathy, unspecified: Secondary | ICD-10-CM | POA: Diagnosis not present

## 2016-05-15 DIAGNOSIS — Z01818 Encounter for other preprocedural examination: Secondary | ICD-10-CM | POA: Diagnosis not present

## 2016-05-15 LAB — BASIC METABOLIC PANEL
BUN: 14 mg/dL (ref 7–25)
CO2: 24 mmol/L (ref 20–31)
Calcium: 8.8 mg/dL (ref 8.6–10.2)
Chloride: 110 mmol/L (ref 98–110)
Creat: 0.67 mg/dL (ref 0.50–1.10)
Glucose, Bld: 99 mg/dL (ref 65–99)
Potassium: 4.6 mmol/L (ref 3.5–5.3)
Sodium: 141 mmol/L (ref 135–146)

## 2016-05-15 LAB — PROTIME-INR
INR: 1.1
Prothrombin Time: 11.9 s — ABNORMAL HIGH (ref 9.0–11.5)

## 2016-05-15 LAB — CBC
HCT: 40.1 % (ref 35.0–45.0)
Hemoglobin: 12.6 g/dL (ref 11.7–15.5)
MCH: 26.3 pg — ABNORMAL LOW (ref 27.0–33.0)
MCHC: 31.4 g/dL — ABNORMAL LOW (ref 32.0–36.0)
MCV: 83.5 fL (ref 80.0–100.0)
MPV: 9.3 fL (ref 7.5–12.5)
Platelets: 293 10*3/uL (ref 140–400)
RBC: 4.8 MIL/uL (ref 3.80–5.10)
RDW: 16.1 % — ABNORMAL HIGH (ref 11.0–15.0)
WBC: 5.5 10*3/uL (ref 3.8–10.8)

## 2016-05-15 MED ORDER — LISINOPRIL 2.5 MG PO TABS: 2.5000 mg | ORAL_TABLET | Freq: Every day | ORAL | 3 refills | Status: DC

## 2016-05-15 MED ORDER — ASPIRIN EC 81 MG PO TBEC: 81.0000 mg | DELAYED_RELEASE_TABLET | Freq: Every day | ORAL | 3 refills | Status: DC

## 2016-05-15 MED ORDER — SPIRONOLACTONE 25 MG PO TABS: 25.0000 mg | ORAL_TABLET | Freq: Every day | ORAL | 3 refills | Status: DC

## 2016-05-15 NOTE — Patient Instructions (Addendum)
Medication Instructions:   START SPIRONOLACTONE 25 MG ONCE DAILY  START LISINOPRIL 2.5 MG ONCE DAILY  START ASPIRIN 81 MG ONCE DAILY  Labwork:  Your physician recommends that you HAVE LAB WORK TODAY  Your physician recommends that you return for lab work in: [place]  Testing/Procedures:  Your physician has requested that you have a cardiac catheterization. Cardiac catheterization is used to diagnose and/or treat various heart conditions. Doctors may recommend this procedure for a number of different reasons. The most common reason is to evaluate chest pain. Chest pain can be a symptom of coronary artery disease (CAD), and cardiac catheterization can show whether plaque is narrowing or blocking your heart's arteries. This procedure is also used to evaluate the valves, as well as measure the blood flow and oxygen levels in different parts of your heart. For further information please visit HugeFiesta.tn. Please follow instruction sheet, as given.      Angiografa coronaria (Coronary Angiogram) Una angiografa coronaria, tambin denominada angiograma coronario, es una radiografa que se [place] para observar las arterias del corazn. En este procedimiento se inyecta un material de contraste a travs de un tubo largo y [place] (catter). El catter tiene [employer] de un espagueti cocido y se inserta a travs de la ingle, la [place] o el brazo. La sustancia de contraste se inyecta en cada arteria y se toman radiografas para ver si hay una obstruccin en las arterias cardacas. INFORME A SU MDICO:  [employer] tenga, incluidas alergias a Occupational hygienist o a sustancias de [place].  Todos los [employer], incluidos vitaminas, hierbas, gotas oftlmicas, cremas y medicamentos de venta libre.  Problemas previos que usted o los [employer] de su familia hayan tenido con el uso de anestsicos.  Enfermedades de la sangre que tenga.  Cirugas  previas.  Antecedentes de problemas renales o insuficiencia renal.  Otras enfermedades que tenga. RIESGOS Y COMPLICACIONES  En general, la angiografa coronaria es un procedimiento seguro. Sin embargo, pueden ocurrir [employer], por ejemplo:  Risk analyst a la sustancia de Programmer, multimedia.  Hemorragia en el lugar de acceso u otras zonas.  Lesin renal, especialmente en las personas con trastornos en la funcin renal.  Ictus (poco frecuente).  Ataque cardaco (poco frecuente). ANTES DEL PROCEDIMIENTO   No coma ni beba nada despus de la medianoche anterior al procedimiento, o segn le haya indicado su mdico.  Consulte a su mdico si debe cambiar o suspender los medicamentos que toma habitualmente. Esto es muy importante si toma medicamentos para la diabetes o anticoagulantes. PROCEDIMIENTO  Podrn administrarle una medicacin que lo ayudar a relajarse (sedante) antes del procedimiento. Se la administrarn a travs de un catter por va intravenosa (IV) que se inserta en una de las venas.  La zona en la que se insertar el catter se lava y se rasura. Generalmente se inserta en la ingle, pero puede insertarse en el pliegue del brazo (cerca del codo) o en la [place].  Le aplicarn un medicamento para adormecer la zona en la que se insertar el catter (anestesia local).  El mdico insertar el catter en una arteria. El catter se guiar utilizando un tipo especial de radiografa (fluoroscopia) del vaso sanguneo examinado.  Luego se inyectar una sustancia de contraste especial por el catter y se tomarn radiografas. La sustancia de contraste mostrar si hay estrechamientos u obstrucciones en las arterias cardacas. DESPUS DEL PROCEDIMIENTO   Si el procedimiento se realiza a travs de la pierna, deber [employer] cama  durante varias horas. Le indicarn que no flexione ni cruce las piernas.  El lugar de la insercin ser controlado con frecuencia.  Le  controlarn con frecuencia el pulso en el pie o en la [place].  Le harn anlisis de [employer], le tomarn radiografas y Publishing rights manager. Esta informacin no tiene Marine scientist el consejo del mdico. Asegrese de hacerle al mdico cualquier pregunta que tenga. Document Released: 12/28/2004 Document Revised: 04/10/2014 Elsevier Interactive Patient Education  2017 [employer].

## 2016-05-22 ENCOUNTER — Observation Stay (HOSPITAL_COMMUNITY)
Admission: RE | Admit: 2016-05-22 | Discharge: 2016-05-23 | Disposition: A | Discharge status: Home / Self Care | Payer: BLUE CROSS/BLUE SHIELD | Source: Ambulatory Visit | Attending: Cardiology | Admitting: Cardiology

## 2016-05-22 ENCOUNTER — Encounter (HOSPITAL_COMMUNITY)
Admission: RE | Disposition: A | Discharge status: Home / Self Care | Payer: Self-pay | Source: Ambulatory Visit | Attending: Cardiology

## 2016-05-22 ENCOUNTER — Observation Stay (HOSPITAL_COMMUNITY): Payer: BLUE CROSS/BLUE SHIELD

## 2016-05-22 ENCOUNTER — Encounter (HOSPITAL_COMMUNITY): Payer: Self-pay | Admitting: Cardiology

## 2016-05-22 DIAGNOSIS — Z87891 Personal history of nicotine dependence: Secondary | ICD-10-CM | POA: Insufficient documentation

## 2016-05-22 DIAGNOSIS — Z79899 Other long term (current) drug therapy: Secondary | ICD-10-CM | POA: Insufficient documentation

## 2016-05-22 DIAGNOSIS — I5043 Acute on chronic combined systolic (congestive) and diastolic (congestive) heart failure: Principal | ICD-10-CM | POA: Insufficient documentation

## 2016-05-22 DIAGNOSIS — I429 Cardiomyopathy, unspecified: Secondary | ICD-10-CM | POA: Diagnosis not present

## 2016-05-22 DIAGNOSIS — R55 Syncope and collapse: Secondary | ICD-10-CM | POA: Diagnosis not present

## 2016-05-22 DIAGNOSIS — I5022 Chronic systolic (congestive) heart failure: Secondary | ICD-10-CM | POA: Diagnosis present

## 2016-05-22 DIAGNOSIS — I959 Hypotension, unspecified: Secondary | ICD-10-CM | POA: Diagnosis not present

## 2016-05-22 DIAGNOSIS — I272 Pulmonary hypertension, unspecified: Secondary | ICD-10-CM | POA: Diagnosis not present

## 2016-05-22 DIAGNOSIS — I340 Nonrheumatic mitral (valve) insufficiency: Secondary | ICD-10-CM | POA: Diagnosis present

## 2016-05-22 DIAGNOSIS — I5021 Acute systolic (congestive) heart failure: Secondary | ICD-10-CM | POA: Diagnosis present

## 2016-05-22 DIAGNOSIS — R4182 Altered mental status, unspecified: Secondary | ICD-10-CM

## 2016-05-22 HISTORY — DX: Acute systolic (congestive) heart failure: I50.21

## 2016-05-22 HISTORY — PX: RIGHT/LEFT HEART CATH AND CORONARY ANGIOGRAPHY: CATH118266

## 2016-05-22 LAB — POCT I-STAT 3, ART BLOOD GAS (G3+)
Acid-base deficit: 3 mmol/L — ABNORMAL HIGH (ref 0.0–2.0)
Bicarbonate: 21.8 mmol/L (ref 20.0–28.0)
O2 Saturation: 99 %
TCO2: 23 mmol/L (ref 0–100)
pCO2 arterial: 38.1 mmHg (ref 32.0–48.0)
pH, Arterial: 7.365 (ref 7.350–7.450)
pO2, Arterial: 142 mmHg — ABNORMAL HIGH (ref 83.0–108.0)

## 2016-05-22 LAB — POCT I-STAT 3, VENOUS BLOOD GAS (G3P V)
Acid-base deficit: 3 mmol/L — ABNORMAL HIGH (ref 0.0–2.0)
Bicarbonate: 22.9 mmol/L (ref 20.0–28.0)
O2 Saturation: 72 %
TCO2: 24 mmol/L (ref 0–100)
pCO2, Ven: 43.4 mmHg — ABNORMAL LOW (ref 44.0–60.0)
pH, Ven: 7.331 (ref 7.250–7.430)
pO2, Ven: 40 mmHg (ref 32.0–45.0)

## 2016-05-22 LAB — PREGNANCY, URINE: Preg Test, Ur: NEGATIVE

## 2016-05-22 LAB — GLUCOSE, CAPILLARY: Glucose-Capillary: 101 mg/dL — ABNORMAL HIGH (ref 65–99)

## 2016-05-22 SURGERY — RIGHT/LEFT HEART CATH AND CORONARY ANGIOGRAPHY

## 2016-05-22 MED ORDER — ZOLPIDEM TARTRATE 5 MG PO TABS: 5.0000 mg | ORAL_TABLET | Freq: Every evening | ORAL | Status: DC | PRN

## 2016-05-22 MED ORDER — PAROXETINE HCL 20 MG PO TABS
20.0000 mg | ORAL_TABLET | Freq: Every day | ORAL | Status: DC
  Administered 2016-05-23: 20 mg via ORAL
  Filled 2016-05-22: qty 1

## 2016-05-22 MED ORDER — SODIUM CHLORIDE 0.9% FLUSH: 3.0000 mL | Freq: Two times a day (BID) | INTRAVENOUS | Status: DC

## 2016-05-22 MED ORDER — HEPARIN (PORCINE) IN NACL 2-0.9 UNIT/ML-% IJ SOLN
INTRAMUSCULAR | Status: DC | PRN
  Administered 2016-05-22: 1000 mL

## 2016-05-22 MED ORDER — HEPARIN SODIUM (PORCINE) 1000 UNIT/ML IJ SOLN
INTRAMUSCULAR | Status: DC | PRN
  Administered 2016-05-22: 3000 [IU] via INTRAVENOUS

## 2016-05-22 MED ORDER — SODIUM CHLORIDE 0.9 % IV SOLN: 250.0000 mL | INTRAVENOUS | Status: DC | PRN

## 2016-05-22 MED ORDER — FENTANYL CITRATE (PF) 100 MCG/2ML IJ SOLN
INTRAMUSCULAR | Status: AC
  Filled 2016-05-22: qty 2

## 2016-05-22 MED ORDER — ONDANSETRON HCL 4 MG/2ML IJ SOLN: 4.0000 mg | Freq: Four times a day (QID) | INTRAMUSCULAR | Status: DC | PRN

## 2016-05-22 MED ORDER — VERAPAMIL HCL 2.5 MG/ML IV SOLN
INTRAVENOUS | Status: DC | PRN
  Administered 2016-05-22: 10 mL via INTRA_ARTERIAL

## 2016-05-22 MED ORDER — B COMPLEX-C PO TABS
1.0000 | ORAL_TABLET | Freq: Every day | ORAL | Status: DC
  Administered 2016-05-23: 1 via ORAL
  Filled 2016-05-22 (×2): qty 1

## 2016-05-22 MED ORDER — HEPARIN (PORCINE) IN NACL 2-0.9 UNIT/ML-% IJ SOLN
INTRAMUSCULAR | Status: AC
  Filled 2016-05-22: qty 1000

## 2016-05-22 MED ORDER — SODIUM CHLORIDE 0.45 % IV SOLN: INTRAVENOUS | Status: DC

## 2016-05-22 MED ORDER — FUROSEMIDE 10 MG/ML IJ SOLN
20.0000 mg | Freq: Once | INTRAMUSCULAR | Status: AC
  Administered 2016-05-22: 20 mg via INTRAVENOUS

## 2016-05-22 MED ORDER — ASPIRIN 81 MG PO CHEW
CHEWABLE_TABLET | ORAL | Status: AC
  Filled 2016-05-22: qty 1

## 2016-05-22 MED ORDER — SODIUM CHLORIDE 0.9% FLUSH
3.0000 mL | Freq: Two times a day (BID) | INTRAVENOUS | Status: DC
  Administered 2016-05-22: 3 mL via INTRAVENOUS

## 2016-05-22 MED ORDER — MIDAZOLAM HCL 2 MG/2ML IJ SOLN
INTRAMUSCULAR | Status: DC | PRN
  Administered 2016-05-22: 1 mg via INTRAVENOUS

## 2016-05-22 MED ORDER — ENOXAPARIN SODIUM 40 MG/0.4ML ~~LOC~~ SOLN
40.0000 mg | SUBCUTANEOUS | Status: DC
  Administered 2016-05-22: 40 mg via SUBCUTANEOUS
  Filled 2016-05-22: qty 0.4

## 2016-05-22 MED ORDER — ALBUTEROL SULFATE (2.5 MG/3ML) 0.083% IN NEBU
3.0000 mL | INHALATION_SOLUTION | Freq: Four times a day (QID) | RESPIRATORY_TRACT | Status: DC | PRN
  Administered 2016-05-22: 3 mL via RESPIRATORY_TRACT
  Filled 2016-05-22 (×2): qty 3

## 2016-05-22 MED ORDER — ATROPINE SULFATE 1 MG/10ML IJ SOSY
PREFILLED_SYRINGE | INTRAMUSCULAR | Status: AC
  Filled 2016-05-22: qty 10

## 2016-05-22 MED ORDER — SODIUM CHLORIDE 0.9% FLUSH: 3.0000 mL | INTRAVENOUS | Status: DC | PRN

## 2016-05-22 MED ORDER — VITAMIN B-2 100 MG PO TABS
100.0000 mg | ORAL_TABLET | Freq: Every day | ORAL | Status: DC
  Filled 2016-05-22: qty 1

## 2016-05-22 MED ORDER — HEPARIN SODIUM (PORCINE) 1000 UNIT/ML IJ SOLN
INTRAMUSCULAR | Status: AC
  Filled 2016-05-22: qty 1

## 2016-05-22 MED ORDER — MIDAZOLAM HCL 2 MG/2ML IJ SOLN
INTRAMUSCULAR | Status: AC
  Filled 2016-05-22: qty 2

## 2016-05-22 MED ORDER — FUROSEMIDE 10 MG/ML IJ SOLN
INTRAMUSCULAR | Status: AC
  Administered 2016-05-22: 20 mg via INTRAVENOUS
  Filled 2016-05-22: qty 4

## 2016-05-22 MED ORDER — ALPRAZOLAM 0.25 MG PO TABS: 0.2500 mg | ORAL_TABLET | Freq: Two times a day (BID) | ORAL | Status: DC | PRN

## 2016-05-22 MED ORDER — FENTANYL CITRATE (PF) 100 MCG/2ML IJ SOLN
INTRAMUSCULAR | Status: DC | PRN
  Administered 2016-05-22 (×2): 25 ug via INTRAVENOUS

## 2016-05-22 MED ORDER — LIDOCAINE HCL (PF) 1 % IJ SOLN
INTRAMUSCULAR | Status: AC
  Filled 2016-05-22: qty 30

## 2016-05-22 MED ORDER — SODIUM CHLORIDE 0.9 % WEIGHT BASED INFUSION
3.0000 mL/kg/h | INTRAVENOUS | Status: AC
  Administered 2016-05-22: 3 mL/kg/h via INTRAVENOUS

## 2016-05-22 MED ORDER — ASPIRIN 81 MG PO CHEW
81.0000 mg | CHEWABLE_TABLET | ORAL | Status: AC
  Administered 2016-05-22: 81 mg via ORAL

## 2016-05-22 MED ORDER — NITROGLYCERIN 0.4 MG SL SUBL: 0.4000 mg | SUBLINGUAL_TABLET | SUBLINGUAL | Status: DC | PRN

## 2016-05-22 MED ORDER — IOPAMIDOL (ISOVUE-370) INJECTION 76%
INTRAVENOUS | Status: AC
  Filled 2016-05-22: qty 100

## 2016-05-22 MED ORDER — TRAZODONE HCL 100 MG PO TABS
100.0000 mg | ORAL_TABLET | Freq: Every day | ORAL | Status: DC
  Administered 2016-05-22: 100 mg via ORAL
  Filled 2016-05-22: qty 1

## 2016-05-22 MED ORDER — ASPIRIN EC 81 MG PO TBEC
81.0000 mg | DELAYED_RELEASE_TABLET | Freq: Every day | ORAL | Status: DC
  Administered 2016-05-23: 81 mg via ORAL
  Filled 2016-05-22: qty 1

## 2016-05-22 MED ORDER — ACETAMINOPHEN 325 MG PO TABS
650.0000 mg | ORAL_TABLET | ORAL | Status: DC | PRN
  Administered 2016-05-22: 650 mg via ORAL
  Filled 2016-05-22: qty 2

## 2016-05-22 MED ORDER — IOPAMIDOL (ISOVUE-370) INJECTION 76%
INTRAVENOUS | Status: DC | PRN
  Administered 2016-05-22: 65 mL via INTRAVENOUS

## 2016-05-22 MED ORDER — SODIUM CHLORIDE 0.9 % WEIGHT BASED INFUSION: 1.0000 mL/kg/h | INTRAVENOUS | Status: DC

## 2016-05-22 MED ORDER — VERAPAMIL HCL 2.5 MG/ML IV SOLN
INTRAVENOUS | Status: AC
  Filled 2016-05-22: qty 2

## 2016-05-22 MED ORDER — LIDOCAINE HCL (PF) 1 % IJ SOLN
INTRAMUSCULAR | Status: DC | PRN
  Administered 2016-05-22: 5 mL via SUBCUTANEOUS

## 2016-05-22 SURGICAL SUPPLY — 13 items
CATH BALLN WEDGE 5F 110CM (CATHETERS) ×2 IMPLANT
CATH IMPULSE 5F ANG/FL3.5 (CATHETERS) ×2 IMPLANT
DEVICE RAD COMP TR BAND LRG (VASCULAR PRODUCTS) ×2 IMPLANT
GLIDESHEATH SLEND SS 6F .021 (SHEATH) ×2 IMPLANT
GUIDEWIRE .025 260CM (WIRE) ×2 IMPLANT
GUIDEWIRE INQWIRE 1.5J.035X260 (WIRE) ×1 IMPLANT
INQWIRE 1.5J .035X260CM (WIRE) ×2
KIT HEART LEFT (KITS) ×2 IMPLANT
PACK CARDIAC CATHETERIZATION (CUSTOM PROCEDURE TRAY) ×2 IMPLANT
SHEATH FAST CATH BRACH 5F 5CM (SHEATH) ×2 IMPLANT
SYR MEDRAD MARK V 150ML (SYRINGE) ×2 IMPLANT
TRANSDUCER W/STOPCOCK (MISCELLANEOUS) ×2 IMPLANT
TUBING CIL FLEX 10 FLL-RA (TUBING) ×2 IMPLANT

## 2016-05-22 NOTE — Progress Notes (Addendum)
Client walked to bathroom with sister and her sister called out and said her sister was sick and when entered bathroom client unresponsive to verbal stimuli and color pale and client diaphoretic; client vomited clear liquid; called rapid response and Armanda Heritage in and called code stroke

## 2016-05-22 NOTE — Code Documentation (Signed)
41 y.o. female with PMH of CHF and mitral regurgitation with EF 25%-30% states that for the past 3 months she has had progressive dyspnea on exertion and orthopnea. Today she presented for a Right/Left Heart Cath and Coronary Angiography. S/p cardiac cath, pt was recovering in the procedural treatment area awaiting discharge. The pt's RN checked on her about 1045 and cleared her to go to the bathroom with her sisters help. At 1050 the pt's sister alerted the RN the the pt had become unresponsive to verbal stimuli, pale in color and diaphoretic. RN noted pt then had 1 episode of clear emesis. RRT and PA notified. Code stroke called. Upon stroke team arrival, pt was assessed. NIHSS 15. SBP in the 80's. On exam pt obtunded with generalized weakness. Does not seem to be aphasic as she is able to follow intermittent commands and per Spanish interpreter is able to intermittently name objects. No focal deficits appreciated. Code stroke and CT to be canceled per Dr. Shon Hale.

## 2016-05-22 NOTE — Progress Notes (Signed)
Pt to 3west, still not responding to voice or following commands. Easy to arouse, and responds to painful stimuli

## 2016-05-22 NOTE — Progress Notes (Signed)
Trish Fountain RN and Weston Brass RN at bedside to assess patient and assist with care.  To call if assistance needed.

## 2016-05-22 NOTE — Progress Notes (Signed)
Transferred to 3-W-37 and report given to RN for 3-W-37

## 2016-05-22 NOTE — Progress Notes (Signed)
Pt now Alert and Oriented x 4. She verbalized she was ready to go home. RN discussed her blood pressures, and the need to monitor. Pt verbalized understanding

## 2016-05-22 NOTE — Interval H&P Note (Signed)
History and Physical Interval Note:  05/22/2016 7:27 AM  Kathy Rosales Rickea Schutz  has presented today for surgery, with the diagnosis of cm - hf  The various methods of treatment have been discussed with the patient and family. After consideration of risks, benefits and other options for treatment, the patient has consented to  Procedure(s): Right/Left Heart Cath and Coronary Angiography (N/A) as a surgical intervention .  The patient's history has been reviewed, patient examined, no change in status, stable for surgery.  I have reviewed the patient's chart and labs.  Questions were answered to the patient's satisfaction.     Oddie Kuhlmann [place] MD,FACC 05/22/2016 7:27 AM

## 2016-05-22 NOTE — Progress Notes (Signed)
Grips weak and equal and answering yes/no

## 2016-05-22 NOTE — Progress Notes (Signed)
Dr Martinique notified of client c/o 8/10 chest pain and shortness of breath and O2 started at 3l/min and medication given as ordered

## 2016-05-22 NOTE — Progress Notes (Signed)
Called to see pt re: MS changes  Pt had gotten IV Lasix 20 mg post-cath 2nd severe MR and had some elevation in her pressures on R heart cath.  Her BP was 123XX123 systolic when the Lasix was given. She had not had food, but had had some apple juice. She was still having some chest pain and she was also having some cath site pain.  She was off the monitor because she was getting ready to go home. When she got up to the bathroom, she became weak, nauseated and vomited some clear fluid. Her sister was with her. The RN was called and they were able to get pt back to bed.   Her responsiveness decreased and Rapid Response and cardiology were called.   On arrival, pt head turned to the L and I was not able to move it easily. Pt barely responsive to questions, could not see face clearly but no obvious cranial nerve deficits. Minimal response to commands but some movement of hands and feet.   Code stroke called initially due to lack of responsiveness and inability to move head which was held to the L.   SBP initially thought to be elevated at 220, but not that high on recheck. Accurate BP was 88 systolic. With LOC changes, 200 cc fluid bolus given. Pt became gradually more responsive, SBP 90s.  Dr Martinique in to see pt, recommended overnight admit for observation.  With BP borderline and pt still less responsive than earlier, will hold the Lasix, lisinopril and aldactone today. Plan to restart tomorrow if pt does well.  No focal signs or symptoms, probably orthostatic hypotension in the setting of low baseline BP, recent sedation, and meds, possible vagal component as well.    Rosaria Ferries, Hershal Coria 05/22/2016 11:55 AM Beeper [phone]

## 2016-05-22 NOTE — H&P (View-Only) (Signed)
Referring: Lottie Mussel MD  HPI: 41 year old female for evaluation of CHF and mitral regurgitation. Patient seen January 3 by primary care with complaints of dyspnea and echocardiogram ordered. Echocardiogram January 2018 showed ejection fraction 25-30%, moderate left ventricular enlargement, grade 3 diastolic dysfunction, moderate to severe mitral regurgitation, severe left atrial enlargement, moderate right ventricular enlargement and moderate tricuspid regurgitation. Chest x-ray January 2018 showed cardiomegaly and mild edema. BNP 1416. Patient states that for the past 3 months she has had progressive dyspnea on exertion and orthopnea. No pedal edema, palpitations or syncope. She has had some chest tightness with laying flat improved with sitting up. Because of the above we were asked to evaluate. Note her symptoms have improved with addition of Lasix but not back to baseline.  Current Outpatient Prescriptions  Medication Sig Dispense Refill  . albuterol (PROVENTIL HFA;VENTOLIN HFA) 108 (90 Base) MCG/ACT inhaler Inhale 2 puffs into the lungs every 6 (six) hours as needed for wheezing or shortness of breath. 1 Inhaler 2  . furosemide (LASIX) 20 MG tablet Take 1 tablet (20 mg total) by mouth daily. 30 tablet 1  . Melatonin 5 MG TABS 1-2 tabs as needed for sleep 60 tablet 3  . PARoxetine (PAXIL) 20 MG tablet Take 20 mg by mouth daily.    . riboflavin (VITAMIN B-2) 100 MG TABS tablet Take 100 mg by mouth daily.    . traZODone (DESYREL) 100 MG tablet Take 200 mg by mouth at bedtime.    . triamcinolone cream (KENALOG) 0.1 % Apply 1 application topically 2 (two) times daily. 80 g 2   No current facility-administered medications for this visit.     Allergies  Allergen Reactions  . Leflunomide Rash     Past Medical History:  Diagnosis Date  . Blood transfusion without reported diagnosis   . 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)     Past Surgical History:  Procedure Laterality Date  . COLONOSCOPY WITH  PROPOFOL N/A 07/23/2015   Procedure: COLONOSCOPY WITH PROPOFOL;  Surgeon: Ronald Lobo, MD;  Location: [facility];  Service: Endoscopy;  Laterality: N/A;  . ESOPHAGOGASTRODUODENOSCOPY (EGD) WITH PROPOFOL N/A 07/23/2015   Procedure: ESOPHAGOGASTRODUODENOSCOPY (EGD) WITH PROPOFOL;  Surgeon: Ronald Lobo, MD;  Location: [facility];  Service: Endoscopy;  Laterality: N/A;  . ESOPHAGOGASTRODUODENOSCOPY (EGD) WITH PROPOFOL N/A 11/18/2015   Procedure: ESOPHAGOGASTRODUODENOSCOPY (EGD) WITH PROPOFOL;  Surgeon: Ronald Lobo, MD;  Location: [facility];  Service: Endoscopy;  Laterality: N/A;    Social History   Social History  . Marital status: Married    Spouse name: N/A  . Number of children: 1  . Years of education: N/A   Occupational History  . Not on file.   Social History Main Topics  . Smoking status: Former Research scientist (life sciences)  . Smokeless tobacco: Never Used  . Alcohol use No  . Drug use: No  . Sexual activity: No   Other Topics Concern  . Not on file   Social History Narrative   ** Merged History Encounter **        Family History  Problem Relation Age of Onset  . Cancer Paternal Grandfather   . Mental illness Neg Hx     ROS: no fevers or chills, productive cough, hemoptysis, dysphasia, odynophagia, melena, hematochezia, dysuria, hematuria, rash, seizure activity, orthopnea, PND, pedal edema, claudication. Remaining systems are negative.  Physical Exam:   Blood pressure 100/60, pulse 70, weight 124 lb 12.8 oz (56.6 kg).  General:  Well developed/well nourished in NAD Skin warm/dry Patient not depressed  No peripheral clubbing Back-normal HEENT-normal/normal eyelids Neck supple/normal carotid upstroke bilaterally; no JVD; no thyromegaly chest - CTA/ normal expansion CV - RRR/normal S1 and S2; no rubs or gallops;  PMI nondisplaced, 3/6 systolic murmur apex Abdomen -NT/ND, no HSM, no mass, + bowel sounds, no bruit 2+ femoral pulses, no bruits Ext-no edema, chords, 2+  DP Neuro-grossly nonfocal  ECG 04/14/2016-sinus rhythm, nonspecific ST changes, prolonged QT interval.  A/P  1 acute systolic congestive heart failure-patient remains mildly symptomatic following addition of Lasix 20 mg daily. We will continue with that dose and add spironolactone 25 mg daily.  2 cardiomyopathy-etiology unclear. She does not have a history of hypertension or alcohol abuse. No history of recent viral infections. Check TSH. I will arrange a cardiac catheterization to exclude coronary disease. The risks and benefits including myocardial infarction, CVA and death discussed and she agrees to proceed. Add lisinopril 2.5 mg daily. Check potassium and renal function in 1 week. I will have her return in 4 weeks and we will add carvedilol if blood pressure allows. Note her systolic blood pressure is 100 and I am hesitant to add beta blocker at this point. She will need repeat echocardiogram in 3 months after medications titrated. If ejection fraction less than 35% would need to consider ICD.   3 mitral regurgitation-I have personally reviewed the patient's echocardiogram. It appears to be severe. This may be related to reduced LV function/left ventricular dilatation. We will plan follow-up echocardiogram once medications titrated. If mitral regurgitation remains severe with need to consider transesophageal echocardiogram to further evaluate mitral valve morphology.  Kirk Ruths, MD

## 2016-05-22 NOTE — Progress Notes (Signed)
Interpretor [place] with patient [phone]

## 2016-05-23 ENCOUNTER — Encounter (HOSPITAL_COMMUNITY): Payer: Self-pay | Admitting: Cardiology

## 2016-05-23 DIAGNOSIS — I5021 Acute systolic (congestive) heart failure: Secondary | ICD-10-CM

## 2016-05-23 DIAGNOSIS — R55 Syncope and collapse: Secondary | ICD-10-CM

## 2016-05-23 DIAGNOSIS — I340 Nonrheumatic mitral (valve) insufficiency: Secondary | ICD-10-CM

## 2016-05-23 DIAGNOSIS — I5043 Acute on chronic combined systolic (congestive) and diastolic (congestive) heart failure: Secondary | ICD-10-CM | POA: Diagnosis not present

## 2016-05-23 LAB — BASIC METABOLIC PANEL
Anion gap: 8 (ref 5–15)
BUN: 8 mg/dL (ref 6–20)
CO2: 23 mmol/L (ref 22–32)
Calcium: 8.6 mg/dL — ABNORMAL LOW (ref 8.9–10.3)
Chloride: 107 mmol/L (ref 101–111)
Creatinine, Ser: 0.64 mg/dL (ref 0.44–1.00)
GFR calc Af Amer: >60 mL/min (ref >60)
GFR calc non Af Amer: >60 mL/min (ref >60)
Glucose, Bld: 91 mg/dL (ref 65–99)
Potassium: 3.6 mmol/L (ref 3.5–5.1)
Sodium: 138 mmol/L (ref 135–145)

## 2016-05-23 MED ORDER — IVABRADINE HCL 5 MG PO TABS: 2.5000 mg | ORAL_TABLET | Freq: Two times a day (BID) | ORAL | Status: DC

## 2016-05-23 MED ORDER — IVABRADINE HCL 5 MG PO TABS: 2.5000 mg | ORAL_TABLET | Freq: Two times a day (BID) | ORAL | 11 refills | Status: DC

## 2016-05-23 MED ORDER — LISINOPRIL 2.5 MG PO TABS: 1.2500 mg | ORAL_TABLET | Freq: Every day | ORAL | 3 refills | Status: DC

## 2016-05-23 NOTE — Progress Notes (Signed)
Called Heart care on call MD for B/P 70-80's/40-50's with a MAP of 58-64, patient is C/O blurred vision, feeling lightheaded and dizzy with some nausea, skin warm and dry, pulses all palpable and patient's mentation is intact and she is able to answer questions correctly and follow commands, relayed all information to MD and no orders received except to continue to monitor patient and call if needed.

## 2016-05-23 NOTE — Progress Notes (Signed)
Progress Note  Patient Name: Kathy Rosales Date of Encounter: 05/23/2016  Primary Cardiologist: Dr. Stanford Breed  Subjective   Patient is feeling well; denies chest pain, SOB, and palpitations. No further nausea, vomiting, or change in mental status.   Inpatient Medications    Scheduled Meds: . aspirin EC  81 mg Oral Daily  . B-complex with vitamin C  1 tablet Oral Daily  . enoxaparin (LOVENOX) injection  40 mg Subcutaneous Q24H  . PARoxetine  20 mg Oral Daily  . sodium chloride flush  3 mL Intravenous Q12H  . traZODone  100 mg Oral QHS   Continuous Infusions: . sodium chloride     PRN Meds: sodium chloride, acetaminophen, albuterol, ALPRAZolam, nitroGLYCERIN, ondansetron (ZOFRAN) IV, sodium chloride flush, zolpidem   Vital Signs    Vitals:   05/22/16 1614 05/22/16 2149 05/23/16 0000 05/23/16 0500  BP: (!) 82/65 (!) 87/62 (!) 77/51 91/63  Pulse:  78 80 83  Resp:   16 14  Temp:  98.4 F (36.9 C)  97.9 F (36.6 C)  TempSrc:  Oral  Oral  SpO2: 100% 99% 93% 99%  Weight:    121 lb 6.4 oz (55.1 kg)  Height:        Intake/Output Summary (Last 24 hours) at 05/23/16 0920 Last data filed at 05/23/16 0858  Gross per 24 hour  Intake              600 ml  Output                0 ml  Net              600 ml   Filed Weights   05/22/16 0629 05/23/16 0500  Weight: 124 lb (56.2 kg) 121 lb 6.4 oz (55.1 kg)     Physical Exam   General: Well developed, well nourished, female appearing in no acute distress. Head: Normocephalic, atraumatic.  Neck: Supple without bruits, no JVD. Lungs:  Resp regular and unlabored, CTA. Heart: RRR, S1, S2, grade 4/6 systolic murmur; no rub. Abdomen: Soft, non-tender, non-distended with normoactive bowel sounds. No hepatomegaly. No rebound/guarding. No obvious abdominal masses. Extremities: No clubbing, cyanosis, Trace edema. Distal pedal pulses are 2+ bilaterally. Neuro: Alert and oriented X 3. Moves all extremities  spontaneously. Psych: Normal affect.  Labs    Chemistry Recent Labs Lab 05/23/16 0646  NA 138  K 3.6  CL 107  CO2 23  GLUCOSE 91  BUN 8  CREATININE 0.64  CALCIUM 8.6*  GFRNONAA >60  GFRAA >60  ANIONGAP 8     HematologyNo results for input(s): WBC, RBC, HGB, HCT, MCV, MCH, MCHC, RDW, PLT in the last 168 hours.  Cardiac EnzymesNo results for input(s): TROPONINI in the last 168 hours. No results for input(s): TROPIPOC in the last 168 hours.   BNPNo results for input(s): BNP, PROBNP in the last 168 hours.   DDimer No results for input(s): DDIMER in the last 168 hours.   Radiology    Ct Head Wo Contrast  Result Date: 05/22/2016 CLINICAL DATA:  Acute onset confusion with nausea and vomiting EXAM: CT HEAD WITHOUT CONTRAST TECHNIQUE: Contiguous axial images were obtained from the base of the skull through the vertex without intravenous contrast. COMPARISON:  December 10, 2015 FINDINGS: Brain: The ventricles are normal in size and configuration. There is no intracranial mass, hemorrhage, extra-axial fluid collection, or midline shift. Gray-white compartments are normal. No acute infarct evident. Vascular: There is no hyperdense vessel evident.  There is no appreciable vascular calcification. Skull: Bony calvarium appears intact. Sinuses/Orbits: There is mucosal thickening in several ethmoid air cells bilaterally. Visualized paranasal sinuses elsewhere clear. Orbits appear symmetric bilaterally. Other: Mastoid air cells are clear. IMPRESSION: Areas of mucosal thickening in several ethmoid air cells. No intracranial mass hemorrhage, or extra-axial fluid collection. Gray-white compartments are normal. Electronically Signed   By: Lowella Grip III M.D.   On: 05/22/2016 15:12     Telemetry    NSR in the 80s - Personally Reviewed  ECG    05/22/16 sinus rhythm - Personally Reviewed   Cardiac Studies   Echocardiogram 04/12/16:  Study Conclusions - Left ventricle: The cavity size  was moderately dilated. Wall   thickness was normal. Systolic function was severely reduced. The   estimated ejection fraction was in the range of 25% to 30%.   Diffuse hypokinesis. Doppler parameters are consistent with a   reversible restrictive pattern, indicative of decreased left   ventricular diastolic compliance and/or increased left atrial   pressure (grade 3 diastolic dysfunction). - Mitral valve: There was moderate to severe regurgitation. - Left atrium: The atrium was severely dilated. - Right ventricle: The cavity size was moderately dilated. - Tricuspid valve: There was moderate regurgitation. - Pulmonary arteries: Systolic pressure was moderately increased.   PA peak pressure: 41 mm Hg (S).  Impressions: - The LV function is severely depressed.   The MR is at least moderate -severe . It appears to almost   completely fill the LA in some views   Right/Left Heart Catheterization 04/21/16:  There is severe left ventricular systolic dysfunction.  LV end diastolic pressure is mildly elevated.  The left ventricular ejection fraction is less than 25% by visual estimate.  Hemodynamic findings consistent with mild pulmonary hypertension.   1. Normal coronary anatomy 2. Marked LV enlargement with severe global HK and EF 20-25% 3. Mild pulmonary HTN 4. Elevated LVEDP. Prominent V waves on PCWP tracing c/w severe MR and/or LV restriction 5. MR- difficult to quantify on angiography due to incomplete opacification of the LV with contrast.  Plan: optimize medical therapy.   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   Assessment & Plan    1. Combine systolic and diastolic heart failure - heart catheterization showed normal coronaries, reduced EF  (20-25%)  - mitral valve was not easily visualized, may need TEE in the future to better assess MR - will medically optimize for discharge - beta blocker previously not started due to marginal sBP (office BP 100/60 on 05/15/16); sBP 70-90s - continue ASA - restart 1.25 mg lisinopril as pressure tolerates. - hold home spironolactone - sCr stable 0.64 - refer to Heart failure clinic for possible corlanor, started 2.5 BID   2. Change in mental status - Following the catheterization yesterday, Ms Kathy Rosales attempted to go to the bathroom but because weak, nauseous and vomited. She was helped back to bed, but her responsiveness decreased. RR called and she was noted to have her head turned tot he left with severely decreased responsiveness.  Code Stroke was called and her BP was noted to the systolic in the 123XX123. 200 cc fluid bolus given with response in sBP to the 90s. Head CT performed, final read pending. Decision was made to  keep her overnight for observation - Today, patient is responsive, neuro exam grossly intact. She is ready for discharge. Will discuss with MD.    Signed, Tami Lin Duke , PA-C 9:20 AM 05/23/2016 Pager: [phone]   I have seen and examined the patient along with Ledora Bottcher , PA-C.  I have reviewed the chart, notes and new data.  I agree with PA's note.  Key new complaints: feels much better than yesterday. Key examination changes: BP is back in her usual range (SBP 85-95). Lying completely flat without dyspnea. HR 80s at rest Key new findings / data: cath images an hemodynamics reviewed.  PLAN: Had a lengthy discussion about the diagnosis (dilated CMP, MR), prognosis, treatment, primary prevention ICD. Therapy is limited due to very low BP. Good candidate for Corlanor. Will start 2.5 mg BID. Will temporarily cut back lisinopril dose. No room for carvedilol, unfortunately. Discussed with Dr. Haroldine Laws and set up for CHF clinic follow up.  Sanda Klein,  MD, [place] [phone] 05/23/2016, 10:59 AM

## 2016-05-23 NOTE — Progress Notes (Signed)
Report received via [place] RN in patient's room using [employer], reviewed orders, labs, VS, meds and patient's general condition, assumed care of patient.

## 2016-05-23 NOTE — Discharge Summary (Signed)
Discharge Summary    Patient ID: Kathy Rosales,  MRN: [id number], DOB/AGE: 41/12/1975 41 y.o.  Admit date: 05/22/2016 Discharge date: 05/23/2016   Primary Care Provider: Maren Rosales Primary Cardiologist: Dr. Stanford Rosales  Discharge Diagnoses    Principal Problem:   Acute systolic CHF (congestive heart failure) [facility]) Active Problems:   Mitral regurgitation   Near syncope   Allergies Allergies  Allergen Reactions  . Tramadol Itching and Other (See Comments)    Red spots all over body and itching  . Leflunomide Rash     History of Present Illness     Kathy Rosales is a 41 year old female who was seen in our clinic (Dr. Stanford Rosales) for evaluation of CHF and mitral regurgitation. Patient seen January 3 by primary care with complaints of dyspnea. Echocardiogram January 2018 showed ejection fraction 25-30%, moderate left ventricular enlargement, grade 3 diastolic dysfunction, moderate to severe mitral regurgitation, severe left atrial enlargement, moderate right ventricular enlargement and moderate tricuspid regurgitation. Chest x-ray January 2018 showed cardiomegaly and mild edema. BNP 1416 at that time. Patient states that for the past 3 months she has had progressive dyspnea on exertion and orthopnea. No pedal edema, palpitations or syncope. She has had some chest tightness with laying flat improved with sitting up. Her symptoms have improved with addition of Lasix but not back to baseline.   She presented to [facility] on 05/22/16 for a schedule right and left heart catheterization.    Hospital Course     Consultants: None  Kathy Rosales presented for scheduled left and right heart catheterization 05/22/16. She had normal coronaries, but showed LVEF of less than 25%.    Following the catheterization, Kathy Rosales attempted to go to the bathroom but became weak, nauseous and vomited. She was helped back to bed, but her responsiveness decreased. RR called and she was noted to  have her head turned tot he left with severely decreased responsiveness.  Code Stroke was called and her BP was noted to the systolic in the [id number]. 200 cc fluid bolus given with response in sBP to the 90s. Head CT performed, final read pending. Decision was made to keep her overnight for observation.  Today, patient feels much better, neuro exam is grossly intact and she responds to questions appropriately. She wants to discharge home. She continues to be hypotensive, sBP 70-80s.   She was discharged on 1.25 mg lisinopril, but holding home spironolactone and lasix. She was started on 2.5 mg BID corlanor.  No beta blocker started on this admission for marginal pressures. She was setup to see Heart Failure clinic next week.  Patient seen and examined by Dr. Sallyanne Rosales  today and was stable for discharge. All follow up has been arranged.  _____________  Discharge Vitals Blood pressure 91/63, pulse 83, temperature 97.9 F (36.6 C), temperature source Oral, resp. rate 14, height 5\' 1"  (1.549 m), weight 121 lb 6.4 oz (55.1 kg), last menstrual period 04/27/2016, SpO2 99 %.  Filed Weights   05/22/16 0629 05/23/16 0500  Weight: 124 lb (56.2 kg) 121 lb 6.4 oz (55.1 kg)    Labs & Radiologic Studies    CBC No results for input(s): WBC, NEUTROABS, HGB, HCT, MCV, PLT in the last 72 hours. Basic Metabolic Panel  Recent Labs  05/23/16 0646  NA 138  K 3.6  CL 107  CO2 23  GLUCOSE 91  BUN 8  CREATININE 0.64  CALCIUM 8.6*   Liver Function Tests No results for input(s):  AST, ALT, ALKPHOS, BILITOT, PROT, ALBUMIN in the last 72 hours. No results for input(s): LIPASE, AMYLASE in the last 72 hours. Cardiac Enzymes No results for input(s): CKTOTAL, CKMB, CKMBINDEX, TROPONINI in the last 72 hours. BNP Invalid input(s): POCBNP D-Dimer No results for input(s): DDIMER in the last 72 hours. Hemoglobin A1C No results for input(s): HGBA1C in the last 72 hours. Fasting Lipid Panel No results for input(s):  CHOL, HDL, LDLCALC, TRIG, CHOLHDL, LDLDIRECT in the last 72 hours. Thyroid Function Tests No results for input(s): TSH, T4TOTAL, T3FREE, THYROIDAB in the last 72 hours.  Invalid input(s): FREET3 _____________  Ct Head Wo Contrast  Result Date: 05/22/2016 CLINICAL DATA:  Acute onset confusion with nausea and vomiting EXAM: CT HEAD WITHOUT CONTRAST TECHNIQUE: Contiguous axial images were obtained from the base of the skull through the vertex without intravenous contrast. COMPARISON:  December 10, 2015 FINDINGS: Brain: The ventricles are normal in size and configuration. There is no intracranial mass, hemorrhage, extra-axial fluid collection, or midline shift. Gray-white compartments are normal. No acute infarct evident. Vascular: There is no hyperdense vessel evident. There is no appreciable vascular calcification. Skull: Bony calvarium appears intact. Sinuses/Orbits: There is mucosal thickening in several ethmoid air cells bilaterally. Visualized paranasal sinuses elsewhere clear. Orbits appear symmetric bilaterally. Other: Mastoid air cells are clear. IMPRESSION: Areas of mucosal thickening in several ethmoid air cells. No intracranial mass hemorrhage, or extra-axial fluid collection. Gray-white compartments are normal. Electronically Signed   By: Lowella Grip III M.D.   On: 05/22/2016 15:12     Diagnostic Studies/Procedures    Echocardiogram 04/12/16:  Study Conclusions - Left ventricle: The cavity size was moderately dilated. Wall thickness was normal. Systolic function was severely reduced. The estimated ejection fraction was in the range of 25% to 30%. Diffuse hypokinesis. Doppler parameters are consistent with a reversible restrictive pattern, indicative of decreased left ventricular diastolic compliance and/or increased left atrial pressure (grade 3 diastolic dysfunction). - Mitral valve: There was moderate to severe regurgitation. - Left atrium: The atrium was severely  dilated. - Right ventricle: The cavity size was moderately dilated. - Tricuspid valve: There was moderate regurgitation. - Pulmonary arteries: Systolic pressure was moderately increased. PA peak pressure: 41 mm Hg (S).  Impressions: - The LV function is severely depressed. The MR is at least moderate -severe . It appears to almost completely fill the LA in some views   Right/Left Heart Catheterization 04/21/16:  There is severe left ventricular systolic dysfunction.  LV end diastolic pressure is mildly elevated.  The left ventricular ejection fraction is less than 25% by visual estimate.  Hemodynamic findings consistent with mild pulmonary hypertension.  1. Normal coronary anatomy 2. Marked LV enlargement with severe global HK and EF 20-25% 3. Mild pulmonary HTN 4. Elevated LVEDP. Prominent V waves on PCWP tracing c/w severe MR and/or LV restriction 5. MR- difficult to quantify on angiography due to incomplete opacification of the LV with contrast.  Plan: optimize medical therapy.   Disposition   Pt is being discharged home today in good condition.  Follow-up Plans & Appointments    Follow-up Information    Darrick Grinder, NP Follow up on 06/01/2016.   Specialty:  Cardiology Why:  3:00pm appt for hospital follow up of new heart failure + hypotension Contact information: [address] [place] [address] [phone]          Discharge Instructions    Call MD for:  redness, tenderness, or signs of infection (pain, swelling, redness, odor  or green/yellow discharge around incision site)    Complete by:  As directed    Diet - low sodium heart healthy    Complete by:  As directed    Discharge instructions    Complete by:  As directed    [place].  PLEASE ATTEND ALL SCHEDULED FOLLOW-UP APPOINTMENTS.   Activity: Increase activity slowly as tolerated. You may shower, but no  soaking baths (or swimming) for 1 week. No driving for 24 hours. No lifting over 5 lbs for 1 week. No sexual activity for 1 week.   You May Return to Work: in 1 week (if applicable)  Wound Care: You may wash cath site gently with soap and water. Keep cath site clean and dry. If you notice pain, swelling, bleeding or pus at your cath site, please call [phone].   Increase activity slowly    Complete by:  As directed       Discharge Medications   Current Discharge Medication List    START taking these medications   Details  ivabradine (CORLANOR) 5 MG TABS tablet Take 0.5 tablets (2.5 mg total) by mouth 2 (two) times daily with a meal. Qty: 30 tablet, Refills: 11      CONTINUE these medications which have CHANGED   Details  lisinopril (PRINIVIL,ZESTRIL) 2.5 MG tablet Take 0.5 tablets (1.25 mg total) by mouth daily. Qty: 90 tablet, Refills: 3   Associated Diagnoses: Cardiomyopathy, unspecified type ([place])      CONTINUE these medications which have NOT CHANGED   Details  albuterol (PROVENTIL HFA;VENTOLIN HFA) 108 (90 Base) MCG/ACT inhaler Inhale 2 puffs into the lungs every 6 (six) hours as needed for wheezing or shortness of breath. Qty: 1 Inhaler, Refills: 2    aspirin EC 81 MG tablet Take 1 tablet (81 mg total) by mouth daily. Qty: 90 tablet, Refills: 3    PARoxetine (PAXIL) 20 MG tablet Take 20 mg by mouth daily.    riboflavin (VITAMIN B-2) 100 MG TABS tablet Take 100 mg by mouth daily.    traZODone (DESYREL) 100 MG tablet Take 100 mg by mouth at bedtime.       STOP taking these medications     furosemide (LASIX) 20 MG tablet      spironolactone (ALDACTONE) 25 MG tablet      triamcinolone cream (KENALOG) 0.1 %            Outstanding Labs/Studies   None  Duration of Discharge Encounter   Greater than 30 minutes including physician time.  Signed, Tami Lin Duke PA-C 05/23/2016, 11:02 AM

## 2016-05-23 NOTE — Progress Notes (Signed)
Discharge and RX instructions reivewed with pt and family with interpreter in room with verb understanding. Pt left via wheelchair in NAD with family and belongings with pt.

## 2016-06-01 ENCOUNTER — Emergency Department (HOSPITAL_COMMUNITY): Payer: BLUE CROSS/BLUE SHIELD

## 2016-06-01 ENCOUNTER — Emergency Department (HOSPITAL_COMMUNITY)
Admission: EM | Admit: 2016-06-01 | Discharge: 2016-06-02 | Disposition: A | Discharge status: Home / Self Care | Payer: BLUE CROSS/BLUE SHIELD | Attending: Emergency Medicine | Admitting: Emergency Medicine

## 2016-06-01 ENCOUNTER — Ambulatory Visit (HOSPITAL_BASED_OUTPATIENT_CLINIC_OR_DEPARTMENT_OTHER)
Admit: 2016-06-01 | Discharge: 2016-06-01 | Disposition: A | Discharge status: Home / Self Care | Payer: BLUE CROSS/BLUE SHIELD | Source: Ambulatory Visit | Attending: Internal Medicine | Admitting: Internal Medicine

## 2016-06-01 VITALS — BP 102/66 | HR 92 | Wt 133.2 lb

## 2016-06-01 DIAGNOSIS — I428 Other cardiomyopathies: Secondary | ICD-10-CM

## 2016-06-01 DIAGNOSIS — Z7982 Long term (current) use of aspirin: Secondary | ICD-10-CM | POA: Insufficient documentation

## 2016-06-01 DIAGNOSIS — J189 Pneumonia, unspecified organism: Secondary | ICD-10-CM | POA: Insufficient documentation

## 2016-06-01 DIAGNOSIS — I5022 Chronic systolic (congestive) heart failure: Secondary | ICD-10-CM | POA: Insufficient documentation

## 2016-06-01 DIAGNOSIS — I509 Heart failure, unspecified: Secondary | ICD-10-CM | POA: Diagnosis not present

## 2016-06-01 DIAGNOSIS — R1011 Right upper quadrant pain: Secondary | ICD-10-CM | POA: Insufficient documentation

## 2016-06-01 DIAGNOSIS — Z79899 Other long term (current) drug therapy: Secondary | ICD-10-CM | POA: Insufficient documentation

## 2016-06-01 DIAGNOSIS — I5021 Acute systolic (congestive) heart failure: Secondary | ICD-10-CM | POA: Insufficient documentation

## 2016-06-01 DIAGNOSIS — I340 Nonrheumatic mitral (valve) insufficiency: Secondary | ICD-10-CM

## 2016-06-01 DIAGNOSIS — R42 Dizziness and giddiness: Secondary | ICD-10-CM

## 2016-06-01 DIAGNOSIS — Z87891 Personal history of nicotine dependence: Secondary | ICD-10-CM

## 2016-06-01 DIAGNOSIS — I959 Hypotension, unspecified: Secondary | ICD-10-CM | POA: Insufficient documentation

## 2016-06-01 DIAGNOSIS — R1013 Epigastric pain: Secondary | ICD-10-CM | POA: Diagnosis present

## 2016-06-01 DIAGNOSIS — R0602 Shortness of breath: Secondary | ICD-10-CM

## 2016-06-01 LAB — CBC
HCT: 37.1 % (ref 36.0–46.0)
Hemoglobin: 12.1 g/dL (ref 12.0–15.0)
MCH: 27.2 pg (ref 26.0–34.0)
MCHC: 32.6 g/dL (ref 30.0–36.0)
MCV: 83.4 fL (ref 78.0–100.0)
Platelets: 216 10*3/uL (ref 150–400)
RBC: 4.45 MIL/uL (ref 3.87–5.11)
RDW: 16.6 % — ABNORMAL HIGH (ref 11.5–15.5)
WBC: 6.5 10*3/uL (ref 4.0–10.5)

## 2016-06-01 LAB — URINALYSIS, ROUTINE W REFLEX MICROSCOPIC
Bacteria, UA: NONE SEEN
Bilirubin Urine: NEGATIVE
Glucose, UA: NEGATIVE mg/dL
Ketones, ur: NEGATIVE mg/dL
Nitrite: NEGATIVE
Protein, ur: NEGATIVE mg/dL
Specific Gravity, Urine: 1.006 (ref 1.005–1.030)
pH: 7 (ref 5.0–8.0)

## 2016-06-01 LAB — COMPREHENSIVE METABOLIC PANEL
ALT: 58 U/L — ABNORMAL HIGH (ref 14–54)
AST: 42 U/L — ABNORMAL HIGH (ref 15–41)
Albumin: 3.9 g/dL (ref 3.5–5.0)
Alkaline Phosphatase: 69 U/L (ref 38–126)
Anion gap: 6 (ref 5–15)
BUN: 13 mg/dL (ref 6–20)
CO2: 22 mmol/L (ref 22–32)
Calcium: 8.5 mg/dL — ABNORMAL LOW (ref 8.9–10.3)
Chloride: 113 mmol/L — ABNORMAL HIGH (ref 101–111)
Creatinine, Ser: 0.67 mg/dL (ref 0.44–1.00)
GFR calc Af Amer: >60 mL/min (ref >60)
GFR calc non Af Amer: >60 mL/min (ref >60)
Glucose, Bld: 122 mg/dL — ABNORMAL HIGH (ref 65–99)
Potassium: 4.3 mmol/L (ref 3.5–5.1)
Sodium: 141 mmol/L (ref 135–145)
Total Bilirubin: 0.8 mg/dL (ref 0.3–1.2)
Total Protein: 6.2 g/dL — ABNORMAL LOW (ref 6.5–8.1)

## 2016-06-01 LAB — BRAIN NATRIURETIC PEPTIDE: B Natriuretic Peptide: 2199.3 pg/mL — ABNORMAL HIGH (ref 0.0–100.0)

## 2016-06-01 LAB — I-STAT BETA HCG BLOOD, ED (MC, WL, AP ONLY): I-stat hCG, quantitative: <5 m[IU]/mL (ref <5)

## 2016-06-01 LAB — LIPASE, BLOOD: Lipase: 15 U/L (ref 11–51)

## 2016-06-01 MED ORDER — MORPHINE SULFATE (PF) 4 MG/ML IV SOLN
4.0000 mg | Freq: Once | INTRAVENOUS | Status: AC
  Administered 2016-06-01: 4 mg via INTRAVENOUS
  Filled 2016-06-01: qty 1

## 2016-06-01 MED ORDER — IOPAMIDOL (ISOVUE-300) INJECTION 61%
INTRAVENOUS | Status: AC
  Filled 2016-06-01: qty 100

## 2016-06-01 MED ORDER — FUROSEMIDE 10 MG/ML IJ SOLN
40.0000 mg | Freq: Once | INTRAMUSCULAR | Status: AC
  Administered 2016-06-01: 40 mg via INTRAVENOUS
  Filled 2016-06-01: qty 4

## 2016-06-01 MED ORDER — IVABRADINE HCL 5 MG PO TABS: 5.0000 mg | ORAL_TABLET | Freq: Two times a day (BID) | ORAL | 3 refills | Status: DC

## 2016-06-01 MED ORDER — IOPAMIDOL (ISOVUE-300) INJECTION 61%
100.0000 mL | Freq: Once | INTRAVENOUS | Status: AC | PRN
  Administered 2016-06-01: 100 mL via INTRAVENOUS

## 2016-06-01 MED ORDER — POTASSIUM CHLORIDE ER 20 MEQ PO TBCR: 20.0000 meq | EXTENDED_RELEASE_TABLET | Freq: Every day | ORAL | 3 refills | Status: DC

## 2016-06-01 MED ORDER — FUROSEMIDE 20 MG PO TABS: 20.0000 mg | ORAL_TABLET | Freq: Every day | ORAL | 3 refills | Status: DC

## 2016-06-01 NOTE — Progress Notes (Signed)
PCP: Dr. Murrell Redden Primary Cardiologist: Dr. Stanford Breed  HF MD: Dr Aundra Dubin   HPI:  Kathy Rosales is a 41 year old female with a past medical history of NICM EF 25-30% (cath in 05/2016 with normal cors), moderate mitral regurgitation, and tricuspid regurgitation. No FH of coronary disease.   Diagnosed with CHF and  went to her PCP in January 2018 for dyspnea. Says she started getting SOB in the fall. Denies any type of viral illness. No family history of heart disease. Echo was performed and showed reduced EF 20-25% with mod-severe MR. at 25-30%. She was referred to Dr. Stanford Breed who then set her up for a left and right heart cath. Admitted over night on 2/19 after cath due to changes in LOC after cath, was hypotensive. Symptoms resolved, head CT negative. Optimization of her medications was prohibited by hypotension prior to discharge. Started on corlanor.   Today she returns for post hospital follow for HF. Overall feeling poorly. SOB at rest and with exeriton. + orthopnea. Now sleeping on 4 pillows. + Cough at night. Tries to follow low salt diet. Drinking  Not able to eat much. Weight at home trending up 123 to 131 pounds. Lives with her Mom. Unable to work right now due to cough and fatigue. Works as Electrical engineer.   RHC/LHC 2/19/018  RA 3 PCWP  20 CO/CI 4.43/2.88 LHC normal cors.   12/10/2015 TSH 2.7   ECHO - 04/2016 EF 20-25%. Mod-Severe MR, Mod TR.   SH: Lives with Mom. No alcohol or drug abuse. Does not smoke cigarettes FH: No history of CAD.   ROS: All systems negative except as listed in HPI, PMH and Problem List.  SH:  Social History   Social History  . Marital status: Married    Spouse name: N/A  . Number of children: 1  . Years of education: N/A   Occupational History  . unemployeed    Social History Main Topics  . Smoking status: Former Research scientist (life sciences)  . Smokeless tobacco: Never Used  . Alcohol use No  . Drug use: No  . Sexual activity: No   Other Topics Concern  . Not on  file   Social History Narrative   ** Merged History Encounter **        FH:  Family History  Problem Relation Age of Onset  . Cancer Paternal Grandfather   . Mental illness Neg Hx     Past Medical History:  Diagnosis Date  . Acute systolic CHF (congestive heart failure) ([place]) 05/22/2016  . Blood transfusion without reported diagnosis   . 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 albuterol (PROVENTIL HFA;VENTOLIN HFA) 108 (90 Base) MCG/ACT inhaler Inhale 2 puffs into the lungs every 6 (six) hours as needed for wheezing or shortness of breath. 1 Inhaler 2  . aspirin EC 81 MG tablet Take 1 tablet (81 mg total) by mouth daily. 90 tablet 3  . ivabradine (CORLANOR) 5 MG TABS tablet Take 0.5 tablets (2.5 mg total) by mouth 2 (two) times daily with a meal. 30 tablet 11  . lisinopril (PRINIVIL,ZESTRIL) 2.5 MG tablet Take 0.5 tablets (1.25 mg total) by mouth daily. 90 tablet 3  . PARoxetine (PAXIL) 20 MG tablet Take 20 mg by mouth daily.    . riboflavin (VITAMIN B-2) 100 MG TABS tablet Take 100 mg by mouth daily.    . traZODone (DESYREL) 100 MG tablet Take 100 mg by mouth at  bedtime.      No current facility-administered medications for this encounter.     Vitals:   06/01/16 1501  BP: 102/66  Pulse: 92  SpO2: 99%  Weight: 133 lb 3.2 oz (60.4 kg)    PHYSICAL EXAM: Interpreter present  General:  Appear fatigued. Mild dyspnea with exertion. Walked slowly in the clinic HEENT: normal Neck: supple. JVP to jaw. Carotids 2+ bilaterally; no bruits. No lymphadenopathy or thryomegaly appreciated. Cor: PMI normal. Regular rate & rhythm. No rubs, gallops or systolic 3/6 murmur. Lungs: decreasd in the bases.  Abdomen: soft, nontender, nondistended. No hepatosplenomegaly. No bruits or masses. Good bowel sounds. Extremities: no cyanosis, clubbing, rash, edema trace edema. Extremities warm.  Neuro: alert & orientedx3, cranial nerves grossly  intact. Moves all 4 extremities w/o difficulty. Affect pleasant.   ASSESSMENT & PLAN: 1. A/C Systolic Heart Failure  NICM. LHC 05/2016 no coronary disease. ECHO 05/2016 EF 20-25% mod MR.  Will need to set up CMRI next visit after volume status improves.  NYHA IIIB. Volume status elevated. Add lasix 40 mg daily x 2 days then 20 mg daily.  Instructed to hold lasix if weight < 125 pounds. Add 20 meq potassium daily.  She has had issues with hypotension and dizziness. No BB for now. Consider dig next visit.  Increase corlanor 5 mg twice a day. Switch 1.25 mg lisinopril to bed time. Hopefully this will eliminate dizziness.  2. Mitral Regurgitation- Moderate on ECHO 05/2016  Dr Aundra Dubin reviewed ECHO today. Hopefully will improve after HF meds optimized.    Follow up in 1 week with Pharmacy. Provided scale and Spanish HF booklet. May need Paramedicine.    Greater than 50% of the (total minutes 25) visit spent in counseling/coordination of care regarding heart failure.   Yocheved Depner NP-C  9:08 AM

## 2016-06-01 NOTE — ED Notes (Signed)
Pt requested pain medication and informed Nehemiah Settle, [place].

## 2016-06-01 NOTE — Patient Instructions (Addendum)
START taking potassium 20 mEq (1 Tablet) Once Daily  INCREASE Ivabradine to 5 mg (1 Tablet) Two times Daily  For TWO DAYS ONLY....take Lasix 40 mg (2 Tablets) Once Daily, then take Lasix 20 mg (1 Tablet) Once Daily from now on.  Follow up with Doroteo Bradford, PharmD next  Follow up in 4 weeks.

## 2016-06-01 NOTE — ED Notes (Signed)
Patient reports has urinated three times since the Lasix. Educated patient and pt's friend on the importance of measurement of urine.

## 2016-06-01 NOTE — ED Provider Notes (Signed)
[facility] DEPT Provider Note   CSN: [place]:[id number] Arrival date & time: 06/01/16  1750     History   Chief Complaint Chief Complaint  Patient presents with  . Abdominal Pain    HPI Kathy Rosales is a 41 y.o. female.  HPI   55  YOF female presents today with complaints of epigastric pain.  Patient notes that symptoms started on Sunday in the epigastric region.  She notes that she feels like she is unable to pass food through her abdomen.  She notes pain has stayed persistent since, worse with eating.  She denies any vomiting, reports small amount of nausea.  She reports she has been able tolerate food, continues to have passed normal bowel movements.  She denies any fever, lower abdominal pain.  She reports pain is worse with forward flexion.  No history of the same.  Patient also notes around the same time she started to have cough, nonproductive.  No associated fevers or shortness of breath.  No lower extremity swelling or edema.   Past Medical History:  Diagnosis Date  . Acute systolic CHF (congestive heart failure) ([place]) 05/22/2016  . Blood transfusion without reported diagnosis   . 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 Acute systolic CHF (congestive heart failure) ([place]) 05/22/2016  . Mitral regurgitation 05/22/2016  . Near syncope 05/22/2016  . Delirium due to multiple etiologies, persistent, mixed level of activity 12/13/2015  . MDD (major depressive disorder), single episode, moderate ([place]) 12/13/2015  . Opioid use disorder, mild, abuse 12/13/2015  . UTI (urinary tract infection) 12/13/2015  . Fatigue 12/01/2015  . Orthostatic hypotension 12/01/2015  . Helicobacter positive gastritis 08/06/2015  . Gastric ulcer 08/06/2015  . Iron deficiency anemia due to chronic blood loss 08/06/2015  . Anemia 07/21/2015  . Iron deficiency anemia 10/22/2014  . B12 deficiency 10/22/2014  . Symptomatic anemia 08/28/2014  .  Headache 08/28/2014  . Chest pain 08/28/2014  . 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 COLONOSCOPY WITH PROPOFOL N/A 07/23/2015   Procedure: COLONOSCOPY WITH PROPOFOL;  Surgeon: Ronald Lobo, MD;  [facility];  Service: Endoscopy;  Laterality: N/A;  . ESOPHAGOGASTRODUODENOSCOPY (EGD) WITH PROPOFOL N/A 07/23/2015   Procedure: ESOPHAGOGASTRODUODENOSCOPY (EGD) WITH PROPOFOL;  Surgeon: Ronald Lobo, MD;  [facility];  Service: Endoscopy;  Laterality: N/A;  . ESOPHAGOGASTRODUODENOSCOPY (EGD) WITH PROPOFOL N/A 11/18/2015   Procedure: ESOPHAGOGASTRODUODENOSCOPY (EGD) WITH PROPOFOL;  Surgeon: Ronald Lobo, MD;  [facility];  Service: Endoscopy;  Laterality: N/A;  . RIGHT/LEFT HEART CATH AND CORONARY ANGIOGRAPHY N/A 05/22/2016   Procedure: Right/Left Heart Cath and Coronary Angiography;  Surgeon: Peter M Martinique, MD;  Location: [facility] CV LAB;  Service: Cardiovascular;  Laterality: N/A;    OB History    No data available       Home Medications    Prior to Admission medications   Medication Sig Start Date End Date Taking? Authorizing Provider  albuterol (PROVENTIL HFA;VENTOLIN HFA) 108 (90 Base) MCG/ACT inhaler Inhale 2 puffs into the lungs every 6 (six) hours as needed for wheezing or shortness of breath. 04/05/16  Yes Maren Reamer, MD  aspirin EC 81 MG tablet Take 1 tablet (81 mg total) by mouth daily. 05/15/16  Yes Lelon Perla, MD  hydrOXYzine (VISTARIL) 25 MG capsule Take 25 mg by mouth 2 (two) times daily as needed for anxiety.   Yes  Historical Provider, MD  ivabradine (CORLANOR) 5 MG TABS tablet Take 1 tablet (5 mg total) by mouth 2 (two) times daily with a meal. Patient taking differently: Take 2.5 mg by mouth 2 (two) times daily with a meal.  06/01/16  Yes Amy D Clegg, NP  lisinopril (PRINIVIL,ZESTRIL) 2.5 MG tablet Take 0.5 tablets (1.25 mg total) by mouth daily. 05/23/16 08/21/16 Yes Tami Lin [place], PA  PARoxetine (PAXIL) 20 MG tablet Take 20 mg by mouth daily.   Yes Historical Provider, MD  traZODone (DESYREL) 100 MG tablet Take 200 mg by mouth at bedtime.    Yes Historical Provider, MD  furosemide (LASIX) 20 MG tablet Take 1 tablet (20 mg total) by mouth daily. 06/01/16   Amy D Clegg, NP  potassium chloride 20 MEQ TBCR Take 20 mEq by mouth daily. 06/01/16   Amy Estrella Deeds, NP    Family History Family History  Problem Relation Age of Onset  . Cancer Paternal Grandfather   . Mental illness Neg Hx     Social History Social History  Substance Use Topics  . Smoking status: Former Research scientist (life sciences)  . Smokeless tobacco: Never Used  . Alcohol use No     Allergies   Tramadol and Leflunomide   Review of Systems Review of Systems  All other systems reviewed and are negative.    Physical Exam Updated Vital Signs BP 115/91 (BP Location: Left Arm)   Pulse 87   Temp 98.1 F (36.7 C) (Oral)   Resp 16   SpO2 97%   Physical Exam  Constitutional: She is oriented to person, place, and time. She appears well-developed and well-nourished.  HENT:  Head: Normocephalic and atraumatic.  Eyes: Conjunctivae are normal. Pupils are equal, round, and reactive to light. Right eye exhibits no discharge. Left eye exhibits no discharge. No scleral icterus.  Neck: Normal range of motion. No JVD present. No tracheal deviation present.  Cardiovascular: Normal rate and regular rhythm.   Pulmonary/Chest: Effort normal and breath sounds normal. No stridor. No respiratory distress. She has no wheezes. She has no rales. She exhibits no tenderness.  Abdominal: Soft. She exhibits no distension and no mass. There is tenderness. There is guarding. There is no rebound. No hernia.  Epigastric TTP   Musculoskeletal: Normal range of motion. She exhibits no edema.  Neurological: She is alert and oriented to person, place, and time. Coordination normal.  Skin: Skin is warm.  Psychiatric: She has a normal mood  and affect. Her behavior is normal. Judgment and thought content normal.  Nursing note and vitals reviewed.    ED Treatments / Results  Labs (all labs ordered are listed, but only abnormal results are displayed) Labs Reviewed  COMPREHENSIVE METABOLIC PANEL - Abnormal; Notable for the following:       Result Value   Chloride 113 (*)    Glucose, Bld 122 (*)    Calcium 8.5 (*)    Total Protein 6.2 (*)    AST 42 (*)    ALT 58 (*)    All other components within normal limits  CBC - Abnormal; Notable for the following:    RDW 16.6 (*)    All other components within normal limits  LIPASE, BLOOD  URINALYSIS, ROUTINE W REFLEX MICROSCOPIC  BRAIN NATRIURETIC PEPTIDE  I-STAT BETA HCG BLOOD, ED ([facility], [facility], [facility] ONLY)    EKG  EKG Interpretation None       Radiology Ct Abdomen Pelvis W Contrast  Result Date: 06/01/2016 CLINICAL DATA:  Acute onset of sensation of epigastric abdominal mass, with swelling. Tenderness to palpation. Initial encounter. EXAM: CT ABDOMEN AND PELVIS WITH CONTRAST TECHNIQUE: Multidetector CT imaging of the abdomen and pelvis was performed using the standard protocol following bolus administration of intravenous contrast. CONTRAST:  119mL ISOVUE-300 IOPAMIDOL (ISOVUE-300) INJECTION 61% COMPARISON:  Pelvic ultrasound and CT of the abdomen and pelvis performed 12/09/2015 FINDINGS: Lower chest: Small to moderate right and trace left pleural effusions are seen, with patchy bibasilar airspace opacification, raising question for underlying infection. The heart is enlarged, with reflux of contrast into the hepatic veins and IVC, raising concern for some degree of right heart insufficiency. Hepatobiliary: The liver is unremarkable in appearance. Prominent gallbladder wall thickening is noted, of uncertain significance. The common bile duct remains borderline normal in caliber. Pancreas: The pancreas is within normal limits. Spleen: The spleen is unremarkable in appearance.  Adrenals/Urinary Tract: The adrenal glands are unremarkable in appearance. The kidneys are within normal limits. There is no evidence of hydronephrosis. No renal or ureteral stones are identified. No perinephric stranding is seen. Stomach/Bowel: The stomach is unremarkable in appearance. The small bowel is within normal limits. The appendix is normal in caliber, without evidence of appendicitis. The colon is unremarkable in appearance. Vascular/Lymphatic: The abdominal aorta is unremarkable in appearance. The inferior vena cava is grossly unremarkable. No retroperitoneal lymphadenopathy is seen. No pelvic sidewall lymphadenopathy is identified. Reproductive: The bladder is relatively decompressed and grossly unremarkable. The uterus is unremarkable in appearance. A large 7 cm left adnexal cystic structure is again noted, similar in appearance to September. Other:  A small amount of ascites is seen tracking about the liver. Musculoskeletal: No acute osseous abnormalities are identified. There is chronic compression deformity involving the superior endplates of 624THL and L5. The visualized musculature is unremarkable in appearance. IMPRESSION: 1. Small to moderate right and trace left pleural effusions, with patchy bibasilar airspace opacification, raising question for underlying infection. 2. Cardiomegaly, new from the prior study, with reflux of contrast into the hepatic veins and IVC, raising concern for some degree of right heart insufficiency. This corresponds to the patient's known acute systolic congestive heart failure. 3. Prominent gallbladder wall thickening, of uncertain significance. This may reflect underlying volume overload. 4. Large 7 cm left adnexal cystic structure again noted, essentially unchanged from September. Annual follow-up was recommended on prior ultrasound. 5. Small amount of ascites noted tracking about the liver. 6. Chronic compression deformity involving the superior plates of 624THL and L5.  Electronically Signed   By: Garald Balding M.D.   On: 06/01/2016 22:08    Procedures Procedures (including critical care time)  Medications Ordered in ED Medications  iopamidol (ISOVUE-300) 61 % injection (not administered)  furosemide (LASIX) injection 40 mg (not administered)  iopamidol (ISOVUE-300) 61 % injection 100 mL (100 mLs Intravenous Contrast Given 06/01/16 2138)     Initial Impression / Assessment and Plan / ED Course  I have reviewed the triage vital signs and the nursing notes.  Pertinent labs & imaging results that were available during my care of the patient were reviewed by me and considered in my medical decision making (see chart for details).     Final Clinical Impressions(s) / ED Diagnoses   Final diagnoses:  RUQ abdominal pain    Labs: Stat beta-hCG, lipase, CMP, CBC  Imaging: CT Abd pelvis w contrast , right upper quadrant ultrasound  Consults:  Therapeutics: Lasix  Discharge Meds:   Assessment/Plan: 41 year old female presents today with complaints of abdominal  pain.  Patient afebrile nontoxic in no acute distress.  CT scan shows gallbladder wall thickening, this will be further evaluated with ultrasound.  Patient also noted to have questionable pneumonia.  She has had a dry cough over this last week, and will likely need outpatient antibiotics pending ultrasound.  Patient does have very small pleural effusions, and a weight gain since her hospital discharge.  She does not appear to be fluid overloaded, no respiratory distress, no lower extremity edema.  Patient will be given a dose of Lasix here, she will be referred back to cardiology for management of her ongoing heart failure.  Patient care will be signed out to oncoming provider pending ultrasound and disposition.    New Prescriptions New Prescriptions   No medications on file     Okey Regal, PA-C 06/01/16 [place], MD 06/02/16 1323

## 2016-06-01 NOTE — ED Triage Notes (Signed)
Pt c/o epigastric mass onset Sunday, worse with swallowing, feels as though food bolus cannot pass mass. Tender to palpation. No emesis, diarrhea, blood in stool.

## 2016-06-01 NOTE — ED Provider Notes (Signed)
[facility], ?LUQ, some RUQ X 4 days Some cough, dry, no fever CT abs/pel - ?PNA - abx for home Labs ok, added BNP Getting lasix for mild effusions on imaging, weight gain, SOB.  There was GB wall thickening on CT - [place] pending for evaluation  Plan - review [place] results If home, then home with abx for PNA  US abdomen c/w CHF - wall thickening, ascites, small amount pericholecystic fluid, no gall stones. She does not have leukocytosis. Elevated BNP of 2199.   She is feeling much better on re-evaluation. She required a dose of pain medication but is comfortable now. No vomiting. Discussed discharge home with patient and SO and she is agreeable to discharge plan. Will have her take 40 mg Lasix and potassium supplement for the next 3 days, then return to prescribed dose. Follow up with MD on Monday (in 4 days).    Charlann Lange, PA-C 06/02/16 0033    Daleen Bo, MD 06/02/16 1323

## 2016-06-02 ENCOUNTER — Encounter: Payer: Self-pay | Admitting: Cardiology

## 2016-06-02 ENCOUNTER — Telehealth (HOSPITAL_COMMUNITY): Payer: Self-pay

## 2016-06-02 MED ORDER — HYDROCODONE-ACETAMINOPHEN 5-325 MG PO TABS: 1.0000 | ORAL_TABLET | ORAL | 0 refills | Status: DC | PRN

## 2016-06-02 MED ORDER — AZITHROMYCIN 250 MG PO TABS: 250.0000 mg | ORAL_TABLET | Freq: Every day | ORAL | 0 refills | Status: DC

## 2016-06-02 NOTE — Discharge Instructions (Signed)
Your Lasix dose as prescribed by your doctor is 20 mg daily and your potassium dose is 20 mEq daily. Take twice this amount of each for the next 3 days (40 mg Lasix and 40 mEq potassium). See your doctor on Monday, 06/05/16. Return to the emergency department if you have worsening symptoms or new concerns.

## 2016-06-02 NOTE — ED Notes (Signed)
Removed a 20g IV from patients right AC. Catheter was intact. No swelling noted to area. Removal due to therapy completed. Site is dry, clean. Applied gauze with tape over site. Pt tolerated it well.

## 2016-06-02 NOTE — Telephone Encounter (Signed)
Corlanor 5mg  PA approved by [employer].   Belia Heman, PharmD PGY1 Pharmacy Resident [phone] (Pager) 06/02/2016 3:42 PM

## 2016-06-05 NOTE — Progress Notes (Signed)
HPI: FU CHF, CM and MR. Echocardiogram January 2018 showed ejection fraction 25-30%, moderate left ventricular enlargement, grade 3 diastolic dysfunction, moderate to severe mitral regurgitation, severe left atrial enlargement, moderate right ventricular enlargement and moderate tricuspid regurgitation. Cardiac catheterization February 2018 showed ejection fraction 25%, normal coronary arteries, mild pulmonary hypertension and elevated left ventricular end-diastolic pressure. Since last seen, she has significant dyspnea on exertion. She has a cough with lying flat. She denies pedal edema, palpitations or syncope. Some chest tightness with lying flat. Recent laboratories March 7 showed BNP 1471.  Current Outpatient Prescriptions  Medication Sig Dispense Refill  . albuterol (PROVENTIL HFA;VENTOLIN HFA) 108 (90 Base) MCG/ACT inhaler Inhale 2 puffs into the lungs every 6 (six) hours as needed for wheezing or shortness of breath. 1 Inhaler 2  . aspirin EC 81 MG tablet Take 1 tablet (81 mg total) by mouth daily. 90 tablet 3  . azithromycin (ZITHROMAX) 250 MG tablet Take 1 tablet (250 mg total) by mouth daily. Take first 2 tablets together, then 1 every day until finished. 6 tablet 0  . furosemide (LASIX) 20 MG tablet Take 1 tablet (20 mg total) by mouth daily. 90 tablet 3  . HYDROcodone-acetaminophen (NORCO/VICODIN) 5-325 MG tablet Take 1 tablet by mouth every 4 (four) hours as needed. 8 tablet 0  . ivabradine (CORLANOR) 5 MG TABS tablet Take 1 tablet (5 mg total) by mouth 2 (two) times daily with a meal. 60 tablet 5  . losartan (COZAAR) 25 MG tablet Take 0.5 tablets (12.5 mg total) by mouth daily. 15 tablet 5  . PARoxetine (PAXIL) 20 MG tablet Take 20 mg by mouth daily.    . potassium chloride 20 MEQ TBCR Take 20 mEq by mouth daily. 90 tablet 3  . riboflavin (VITAMIN B-2) 100 MG TABS tablet Take 200 mg by mouth daily.    . traZODone (DESYREL) 100 MG tablet Take 200 mg by mouth at bedtime.       No current facility-administered medications for this visit.      Past Medical History:  Diagnosis Date  . Acute systolic CHF (congestive heart failure) ([place]) 05/22/2016  . Blood transfusion without reported diagnosis   . 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 COLONOSCOPY WITH PROPOFOL N/A 07/23/2015   Procedure: COLONOSCOPY WITH PROPOFOL;  Surgeon: Ronald Lobo, MD;  [facility];  Service: Endoscopy;  Laterality: N/A;  . ESOPHAGOGASTRODUODENOSCOPY (EGD) WITH PROPOFOL N/A 07/23/2015   Procedure: ESOPHAGOGASTRODUODENOSCOPY (EGD) WITH PROPOFOL;  Surgeon: Ronald Lobo, MD;  [facility];  Service: Endoscopy;  Laterality: N/A;  . ESOPHAGOGASTRODUODENOSCOPY (EGD) WITH PROPOFOL N/A 11/18/2015   Procedure: ESOPHAGOGASTRODUODENOSCOPY (EGD) WITH PROPOFOL;  Surgeon: Ronald Lobo, MD;  [facility];  Service: Endoscopy;  Laterality: N/A;  . RIGHT/LEFT HEART CATH AND CORONARY ANGIOGRAPHY N/A 05/22/2016   Procedure: Right/Left Heart Cath and Coronary Angiography;  Surgeon: Peter M Martinique, MD;  Location: [place] CV LAB;  Service: Cardiovascular;  Laterality: N/A;    Social History   Social History  . Marital status: Married    Spouse name: N/A  . Number of children: 1  . Years of education: N/A   Occupational History  . unemployeed    Social History Main Topics  . Smoking status: Former Research scientist (life sciences)  . Smokeless tobacco: Never Used  . Alcohol use No  . Drug use: No  . Sexual activity: No   Other Topics Concern  . Not on  file   Social History Narrative   ** Merged History Encounter **        Family History  Problem Relation Age of Onset  . Cancer Paternal Grandfather   . Mental illness Neg Hx     ROS: no fevers or chills, productive cough, hemoptysis, dysphasia, odynophagia, melena, hematochezia, dysuria, hematuria, rash, seizure activity, PND, pedal edema, claudication. Remaining systems are  negative.  Physical Exam: Well-developed well-nourished in no acute distress.  Skin is warm and dry.  HEENT is normal.  Neck is supple. No bruits Chest is clear to auscultation with normal expansion.  Cardiovascular exam is regular rate and rhythm.  Abdominal exam nontender or distended. No masses palpated. Extremities show no edema. neuro grossly intact  A/P  1 nonischemic cardiomyopathy-etiology unclear. No history of alcohol abuse or significant hypertension. Reduce TSH normal. Catheterization revealed no coronary disease. Plan medical therapy. Her blood pressure is borderline and I will not advance ARB or add beta-blockade at this point. Add digoxin 0.125 mg daily. Add beta blocker later as blood pressure and heart failure symptoms allow. Follow up in the CHF clinic for medication titration. Once medications fully titrated with plan echocardiogram 3 months later and if ejection fraction less than 35% would require ICD.  2 chronic systolic congestive heart failure-patient remains volume overloaded. Increase Lasix to 40 mg daily. Check potassium and renal function in 1 week. We will consider addition of spironolactone in the future.  3 moderate to severe mitral regurgitation-this is likely secondary to congestive heart failure and annular dilatation. This will be reassessed with follow-up echocardiogram in the future.  Kirk Ruths, MD

## 2016-06-07 ENCOUNTER — Ambulatory Visit (HOSPITAL_COMMUNITY)
Admission: RE | Admit: 2016-06-07 | Discharge: 2016-06-07 | Disposition: A | Discharge status: Home / Self Care | Payer: BLUE CROSS/BLUE SHIELD | Source: Ambulatory Visit | Attending: Cardiology | Admitting: Cardiology

## 2016-06-07 VITALS — BP 100/70 | HR 89 | Wt 129.0 lb

## 2016-06-07 DIAGNOSIS — I5022 Chronic systolic (congestive) heart failure: Secondary | ICD-10-CM | POA: Insufficient documentation

## 2016-06-07 DIAGNOSIS — I340 Nonrheumatic mitral (valve) insufficiency: Secondary | ICD-10-CM | POA: Insufficient documentation

## 2016-06-07 DIAGNOSIS — Z79899 Other long term (current) drug therapy: Secondary | ICD-10-CM | POA: Insufficient documentation

## 2016-06-07 LAB — BASIC METABOLIC PANEL
Anion gap: 6 (ref 5–15)
BUN: 11 mg/dL (ref 6–20)
CO2: 22 mmol/L (ref 22–32)
Calcium: 8.3 mg/dL — ABNORMAL LOW (ref 8.9–10.3)
Chloride: 113 mmol/L — ABNORMAL HIGH (ref 101–111)
Creatinine, Ser: 0.74 mg/dL (ref 0.44–1.00)
GFR calc Af Amer: >60 mL/min (ref >60)
GFR calc non Af Amer: >60 mL/min (ref >60)
Glucose, Bld: 99 mg/dL (ref 65–99)
Potassium: 4.3 mmol/L (ref 3.5–5.1)
Sodium: 141 mmol/L (ref 135–145)

## 2016-06-07 LAB — BRAIN NATRIURETIC PEPTIDE: B Natriuretic Peptide: 1471.6 pg/mL — ABNORMAL HIGH (ref 0.0–100.0)

## 2016-06-07 MED ORDER — IVABRADINE HCL 5 MG PO TABS: 5.0000 mg | ORAL_TABLET | Freq: Two times a day (BID) | ORAL | 5 refills | Status: DC

## 2016-06-07 MED ORDER — LOSARTAN POTASSIUM 25 MG PO TABS: 12.5000 mg | ORAL_TABLET | Freq: Every day | ORAL | 5 refills | Status: DC

## 2016-06-07 NOTE — Progress Notes (Signed)
HF MD: Graviel Payeur  HPI:  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 No FH of coronary disease.   Diagnosed with CHF and  went to her PCP in January 2018 for dyspnea. Says she started getting SOB in the fall. Denies any type of viral illness. No family history of heart disease. Echo was performed and showed reduced EF 20-25% with mod-severe MR. at 25-30%. She was referred to Dr. Stanford Breed who then set her up for a left and right heart cath. Admitted over night on 2/19 after cath due to changes in LOC after cath, was hypotensive. Symptoms resolved, head CT negative. Optimization of her medications was prohibited by hypotension prior to discharge. Started on corlanor.   Today she returns for pharmacist-led HF medication titration. At last HF clinic visit on 06/01/16, she was instructed to start taking KCl 20 mEq daily and to increase Corlanor to 5 mg BID (which she did not do). She was also instructed to increase her furosemide for 2 days. Overall still feeling poorly. SOB at rest and with exertion. Still having dizziness most days usually upon waking and standing too quickly. Still having dry cough at night despite being prescribed a Z-pak for presumed CAP. Lives with her Mom. Unable to work right now due to cough and fatigue. Works as Electrical engineer.     . Shortness of breath/dyspnea on exertion? yes  . Orthopnea/PND? yes . Edema? no . Lightheadedness/dizziness? yes . Daily weights at home? Yes - stable between 127-129 lb . Blood pressure/heart rate monitoring at home? no . Following low-sodium/fluid-restricted diet? yes  HF Medications: Furosemide 20 mg PO daily Corlanor 5 mg PO BID Lisinopril 1.25 mg PO QHS KCl 20 mEq PO daily   Has the patient been experiencing any side effects to the medications prescribed?  Yes - dry cough may be due to lisinopril since  started soon after initiation  Does the patient have any problems obtaining medications due to transportation or finances?   No - [employer]   Understanding of regimen: fair Understanding of indications: fair Potential of compliance: good Patient understands to avoid NSAIDs. Patient understands to avoid decongestants.    Pertinent Lab Values: . 06/07/16: Serum creatinine 0.75, BUN 11, Potassium 4.3, Sodium 141, BNP 1471.6 (down from 2199 on 3/1)  Vital Signs: . Weight: 129 lb (dry weight: 121 lb) . Blood pressure: 100/70 mmHg  . Heart rate: 89 bpm    Assessment: 1. Chronicsystolic CHF (EF 35/00%), due to NICM. NYHA class IIIsymptoms.  - Volume status slightly elevated but improved from last week   - Still having difficulty with dizziness and hypotension  - Switch from lisinopril to losartan 12.5 mg QHS to see if this helps with dry cough  - Increase Corlanor to 5 mg BID as instructed at last visit  - Continue furosemide 20 mg daily along with KCl 20 meq daily  - Basic disease state pathophysiology, medication indication, mechanism and side effects reviewed at length with patient and he verbalized understanding 2. Mitral Regurgitation- Moderate on ECHO 05/2016   - Hopefully will improve after HF meds optimized   Plan: 1) Medication changes: Based on clinical presentation, vital signs and recent labs will switch lisinopril to losartan 12.5 mg daily and increase Corlanor to 5 mg BID 2) Labs: BMET/BNP today 3) Follow-up: Amy Ninfa Meeker, NP-C on 06/29/16   [name],  PharmD, BCPS, CPP Clinical Pharmacist Pager: [phone] Phone: [phone] 06/07/2016 2:58 PM

## 2016-06-07 NOTE — Patient Instructions (Signed)
Por favor deja de tomar lisinopril.  Empieza losartan 12.5 mg (1/2 tableta) una vez al dia en la noche.  Aumenta el Corlanor a 5 mg (1 tableta completa) dos veces al dia.   [place], NP-C 06/29/16.

## 2016-06-09 ENCOUNTER — Encounter: Payer: Self-pay | Admitting: Cardiology

## 2016-06-09 ENCOUNTER — Ambulatory Visit (INDEPENDENT_AMBULATORY_CARE_PROVIDER_SITE_OTHER): Payer: BLUE CROSS/BLUE SHIELD | Admitting: Cardiology

## 2016-06-09 VITALS — BP 116/78 | HR 91 | Ht 61.0 in | Wt 128.0 lb

## 2016-06-09 DIAGNOSIS — I428 Other cardiomyopathies: Secondary | ICD-10-CM | POA: Diagnosis not present

## 2016-06-09 DIAGNOSIS — I5022 Chronic systolic (congestive) heart failure: Secondary | ICD-10-CM | POA: Diagnosis not present

## 2016-06-09 MED ORDER — DIGOXIN 125 MCG PO TABS: 0.1250 mg | ORAL_TABLET | Freq: Every day | ORAL | 3 refills | Status: DC

## 2016-06-09 MED ORDER — FUROSEMIDE 20 MG PO TABS: 40.0000 mg | ORAL_TABLET | Freq: Every day | ORAL | 3 refills | Status: DC

## 2016-06-09 NOTE — Patient Instructions (Signed)
Medication Instructions:   INCREASE FUROSEMIDE TO 40 MG ONCE DAILY= 2 OF THE 20 MG TABLETS ONCE DAILY  START DIGOXIN 0.125 MG ONCE DAILY  Labwork:  Your physician recommends that you return for lab work in: [place]:  Your physician recommends that you schedule a follow-up appointment in: [place]

## 2016-06-14 ENCOUNTER — Inpatient Hospital Stay: Payer: Self-pay | Admitting: Internal Medicine

## 2016-06-15 LAB — BASIC METABOLIC PANEL
BUN: 15 mg/dL (ref 7–25)
CO2: 24 mmol/L (ref 20–31)
Calcium: 9 mg/dL (ref 8.6–10.2)
Chloride: 103 mmol/L (ref 98–110)
Creat: 0.7 mg/dL (ref 0.50–1.10)
Glucose, Bld: 79 mg/dL (ref 65–99)
Potassium: 4.2 mmol/L (ref 3.5–5.3)
Sodium: 138 mmol/L (ref 135–146)

## 2016-06-29 ENCOUNTER — Ambulatory Visit (HOSPITAL_COMMUNITY)
Admission: RE | Admit: 2016-06-29 | Discharge: 2016-06-29 | Disposition: A | Discharge status: Home / Self Care | Payer: BLUE CROSS/BLUE SHIELD | Source: Ambulatory Visit | Attending: Internal Medicine | Admitting: Internal Medicine

## 2016-06-29 ENCOUNTER — Encounter (HOSPITAL_COMMUNITY): Payer: Self-pay

## 2016-06-29 VITALS — BP 110/66 | HR 82 | Wt 122.0 lb

## 2016-06-29 DIAGNOSIS — Z79899 Other long term (current) drug therapy: Secondary | ICD-10-CM | POA: Insufficient documentation

## 2016-06-29 DIAGNOSIS — Z87891 Personal history of nicotine dependence: Secondary | ICD-10-CM | POA: Insufficient documentation

## 2016-06-29 DIAGNOSIS — I428 Other cardiomyopathies: Secondary | ICD-10-CM | POA: Insufficient documentation

## 2016-06-29 DIAGNOSIS — I340 Nonrheumatic mitral (valve) insufficiency: Secondary | ICD-10-CM | POA: Diagnosis not present

## 2016-06-29 DIAGNOSIS — R5383 Other fatigue: Secondary | ICD-10-CM | POA: Diagnosis not present

## 2016-06-29 DIAGNOSIS — Z7982 Long term (current) use of aspirin: Secondary | ICD-10-CM | POA: Insufficient documentation

## 2016-06-29 DIAGNOSIS — I5022 Chronic systolic (congestive) heart failure: Secondary | ICD-10-CM | POA: Diagnosis not present

## 2016-06-29 MED ORDER — LOSARTAN POTASSIUM 25 MG PO TABS: 12.5000 mg | ORAL_TABLET | Freq: Every day | ORAL | 5 refills | Status: DC

## 2016-06-29 NOTE — Patient Instructions (Signed)
Follow up in 2 weeks with pharmacy.  (Seguimiento en 2 semanas con farmacia.)  Follow up in 4 weeks with Amy.   (Haga un seguimiento con Amy en 4 semanas.)  Decrease Losartan to 12.5mg  at bedtime.  (Disminuya Losartan a 12.5 mg al acostarse.)

## 2016-06-29 NOTE — Progress Notes (Signed)
PCP: Dr. Murrell Redden Primary Cardiologist: Dr. Stanford Breed  HF MD: Dr Aundra Dubin   HPI:  Kathy Rosales is a 41 year old female with a past medical history of NICM EF 25-30% (cath in 05/2016 with normal cors), moderate mitral regurgitation, and tricuspid regurgitation. No FH of coronary disease.   Diagnosed with CHF and  went to her PCP in January 2018 for dyspnea. Says she started getting SOB in the fall. Denies any type of viral illness. No family history of heart disease. Echo was performed and showed reduced EF 20-25% with mod-severe MR. at 25-30%. She was referred to Dr. Stanford Breed who then set her up for a left and right heart cath. Admitted over night on 2/19 after cath due to changes in LOC after cath, was hypotensive. Symptoms resolved, head CT negative. Optimization of her medications was prohibited by hypotension prior to discharge. Started on corlanor.   Today she returns for HF follow up. Overall feeling good. SOB  With exertion. Complaining of fatigue. Weight at home 122-125 pounds. Ran out of losartan a few days ago. She was taking 12.5 mg losartan twice a day instead of once a day. Complaining of dizziness. Lives with her Mom. Not working right now.   RHC/LHC 2/19/018  RA 3 PCWP  20 CO/CI 4.43/2.88 LHC normal cors.   12/10/2015 TSH 2.7   ECHO - 04/2016 EF 20-25%. Mod-Severe MR, Mod TR.   SH: Lives with Mom. No alcohol or drug abuse. Does not smoke cigarettes FH: No history of CAD.   ROS: All systems negative except as listed in HPI, PMH and Problem List.  SH:  Social History   Social History  . Marital status: Married    Spouse name: N/A  . Number of children: 1  . Years of education: N/A   Occupational History  . unemployeed    Social History Main Topics  . Smoking status: Former Research scientist (life sciences)  . Smokeless tobacco: Never Used  . Alcohol use No  . Drug use: No  . Sexual activity: No   Other Topics Concern  . Not on file   Social History Narrative   ** Merged History Encounter  **        FH:  Family History  Problem Relation Age of Onset  . Cancer Paternal Grandfather   . Mental illness Neg Hx     Past Medical History:  Diagnosis Date  . Acute systolic CHF (congestive heart failure) ([place]) 05/22/2016  . Blood transfusion without reported diagnosis   . 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 albuterol (PROVENTIL HFA;VENTOLIN HFA) 108 (90 Base) MCG/ACT inhaler Inhale 2 puffs into the lungs every 6 (six) hours as needed for wheezing or shortness of breath. 1 Inhaler 2  . aspirin EC 81 MG tablet Take 1 tablet (81 mg total) by mouth daily. 90 tablet 3  . digoxin (LANOXIN) 0.125 MG tablet Take 1 tablet (0.125 mg total) by mouth daily. 90 tablet 3  . furosemide (LASIX) 20 MG tablet Take 2 tablets (40 mg total) by mouth daily. 180 tablet 3  . ivabradine (CORLANOR) 5 MG TABS tablet Take 1 tablet (5 mg total) by mouth 2 (two) times daily with a meal. 60 tablet 5  . losartan (COZAAR) 25 MG tablet Take 0.5 tablets (12.5 mg total) by mouth daily. 15 tablet 5  . PARoxetine (PAXIL) 20 MG tablet Take 20 mg by mouth daily.    . potassium chloride 20 MEQ  TBCR Take 20 mEq by mouth daily. 90 tablet 3  . riboflavin (VITAMIN B-2) 100 MG TABS tablet Take 200 mg by mouth daily.    . traZODone (DESYREL) 100 MG tablet Take 200 mg by mouth at bedtime.      No current facility-administered medications for this encounter.     Vitals:   06/29/16 1517  BP: 110/66  Pulse: 82  SpO2: 99%  Weight: 122 lb (55.3 kg)    PHYSICAL EXAM: Interpreter present  General:  NAD. Walked in the clinic.  HEENT: normal Neck: supple. JVP 5-6. Carotids 2+ bilaterally; no bruits. No lymphadenopathy or thryomegaly appreciated. Cor: PMI normal. RRR No rubs, gallops or systolic 3/6 murmur. Lungs: CTAB.  Abdomen: soft, NT, ND. No hepatosplenomegaly. No bruits or masses. Good bowel sounds. Extremities: no cyanosis, clubbing, rash, no edema.  Extremities warm.  Neuro: alert & orientedx3, cranial nerves grossly intact. Moves all 4 extremities w/o difficulty. Affect pleasant.   ASSESSMENT & PLAN: 1. Chronic Systolic Heart Failure  NICM. LHC 05/2016 no coronary disease. ECHO 05/2016 EF 20-25% mod MR.  Will need to set up CMRI next visit after volume status improves.  NYHA IIIB. Volume status stable. Continue  lasix 40 mg daily.  Today I will not add bb due to fatigue.  Cut back losartan to 12.5 mg at bed time.  Increase corlanor 5 mg twice a day. Repeat ECHO in June  2. Mitral Regurgitation- Moderate on ECHO 05/2016 . Repeat ECHO in June   Today she is referred to the HF Paramedicine due to medication errors. Encouraged to refill medications when she runs out. Today she was out losartan for a few days and taking to much.   Follow up with pharmacy in 2 week and 4 weeks with APP.   Greater than 50% of the (total minutes 25) visit spent in counseling/coordination of care regarding heart failure.   Kathy Nessel NP-C  3:25 PM

## 2016-06-30 ENCOUNTER — Telehealth (HOSPITAL_COMMUNITY): Payer: Self-pay | Admitting: Surgery

## 2016-06-30 NOTE — Telephone Encounter (Signed)
Ms.  Kathy Rosales has been referred to the HF Community Paramedicine program.  I have sent the appropriate form via secure email to [employer].

## 2016-07-11 ENCOUNTER — Encounter: Payer: Self-pay | Admitting: Internal Medicine

## 2016-07-11 ENCOUNTER — Ambulatory Visit: Payer: BLUE CROSS/BLUE SHIELD | Attending: Internal Medicine | Admitting: Internal Medicine

## 2016-07-11 VITALS — BP 102/65 | HR 86 | Temp 98.1°F | Resp 16 | Wt 122.8 lb

## 2016-07-11 DIAGNOSIS — Z888 Allergy status to other drugs, medicaments and biological substances status: Secondary | ICD-10-CM | POA: Diagnosis not present

## 2016-07-11 DIAGNOSIS — I5021 Acute systolic (congestive) heart failure: Secondary | ICD-10-CM | POA: Diagnosis not present

## 2016-07-11 DIAGNOSIS — I5020 Unspecified systolic (congestive) heart failure: Secondary | ICD-10-CM | POA: Diagnosis not present

## 2016-07-11 DIAGNOSIS — Z79899 Other long term (current) drug therapy: Secondary | ICD-10-CM | POA: Insufficient documentation

## 2016-07-11 DIAGNOSIS — I340 Nonrheumatic mitral (valve) insufficiency: Secondary | ICD-10-CM | POA: Insufficient documentation

## 2016-07-11 DIAGNOSIS — Z7982 Long term (current) use of aspirin: Secondary | ICD-10-CM | POA: Insufficient documentation

## 2016-07-11 DIAGNOSIS — R05 Cough: Secondary | ICD-10-CM | POA: Insufficient documentation

## 2016-07-11 DIAGNOSIS — I428 Other cardiomyopathies: Secondary | ICD-10-CM | POA: Insufficient documentation

## 2016-07-11 DIAGNOSIS — R059 Cough, unspecified: Secondary | ICD-10-CM

## 2016-07-11 MED ORDER — BENZONATATE 100 MG PO CAPS: 100.0000 mg | ORAL_CAPSULE | Freq: Three times a day (TID) | ORAL | 3 refills | Status: DC | PRN

## 2016-07-11 NOTE — Progress Notes (Signed)
Kathy Rosales, is a 41 y.o. female  GXQ:[id number]  XKG:[id number]  DOB - 1975-08-26  Chief Complaint  Patient presents with  . Cough        Subjective:   Kathy Rosales is a 41 y.o. female here today for a follow up visit for chf, systolic. Overall she is doing much better. She is now seeing cardiology and heart failure clinic.  She is watching her diet, and her weight is staying at 122-125 lbs.   She sleeps w/ 1 pillow, but does c/o of nagging unproductive cough at night. Denies gerd-like sx.  She is taking all her psyche meds, mood much better than prior, and things are going well w/ her bf (who is not here today)  Does not smoke or drink etoh.  Patient has No headache, No chest pain, No abdominal pain - No Nausea, No new weakness tingling or numbness.  No problems updated.  ALLERGIES: Allergies  Allergen Reactions  . Tramadol Itching and Other (See Comments)    Red spots all over body and itching  . Leflunomide Rash    PAST MEDICAL HISTORY: Past Medical History:  Diagnosis Date  . Acute systolic CHF (congestive heart failure) ([place]) 05/22/2016  . Blood transfusion without reported diagnosis   . 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 Authorizing Provider  albuterol (PROVENTIL HFA;VENTOLIN HFA) 108 (90 Base) MCG/ACT inhaler Inhale 2 puffs into the lungs every 6 (six) hours as needed for wheezing or shortness of breath. 04/05/16   Maren Reamer, MD  aspirin EC 81 MG tablet Take 1 tablet (81 mg total) by mouth daily. 05/15/16   Lelon Perla, MD  benzonatate (TESSALON PERLES) 100 MG capsule Take 1 capsule (100 mg total) by mouth 3 (three) times daily as needed for cough. 07/11/16   Maren Reamer, MD  digoxin (LANOXIN) 0.125 MG tablet Take 1 tablet (0.125 mg total) by mouth daily. 06/09/16   Lelon Perla, MD  furosemide (LASIX) 20 MG tablet Take 2 tablets (40 mg  total) by mouth daily. 06/09/16   Lelon Perla, MD  ivabradine (CORLANOR) 5 MG TABS tablet Take 1 tablet (5 mg total) by mouth 2 (two) times daily with a meal. 06/07/16   Jolaine Artist, MD  losartan (COZAAR) 25 MG tablet Take 0.5 tablets (12.5 mg total) by mouth at bedtime. 06/29/16   Amy D Clegg, NP  PARoxetine (PAXIL) 20 MG tablet Take 20 mg by mouth daily.    Historical Provider, MD  potassium chloride 20 MEQ TBCR Take 20 mEq by mouth daily. 06/01/16   Amy D Clegg, NP  riboflavin (VITAMIN B-2) 100 MG TABS tablet Take 200 mg by mouth daily.    Historical Provider, MD  traZODone (DESYREL) 100 MG tablet Take 200 mg by mouth at bedtime.     Historical Provider, MD     Objective:   Vitals:   07/11/16 1459  BP: 102/65  Pulse: 86  Resp: 16  Temp: 98.1 F (36.7 C)  TempSrc: Oral  SpO2: 98%  Weight: 122 lb 12.8 oz (55.7 kg)   When we last saw in clinic,  05/09/16 - she weighed 131 lbs.  Exam General appearance : Awake, alert, not in any distress. Speech Clear. Not toxic looking, pleasant. HEENT: Atraumatic and Normocephalic, pupils equally reactive to light. Neck: supple, no JVD. No cervical lymphadenopathy.  Chest:Good air entry bilaterally,  no added sounds. CVS: S1 S2 regular, no murmurs/gallups or rubs. Abdomen: Bowel sounds active, Non tender and not distended with no gaurding, rigidity or rebound. Extremities: B/L Lower Ext shows no edema, both legs are warm to touch Neurology: Awake alert, and oriented X 3, CN II-XII grossly intact, Non focal Skin:No Rash  Data Review Lab Results  Component Value Date   HGBA1C 5.8 (H) 12/14/2015   HGBA1C 4.3 (L) 09/29/2015    Depression screen PHQ 2/9 07/11/2016 05/09/2016 04/14/2016 04/14/2016 04/05/2016  Decreased Interest 0 2 0 1 2  Down, Depressed, Hopeless 1 1 0 2 2  PHQ - 2 Score 1 3 0 3 4  Altered sleeping - 3 1 3 3   Tired, decreased energy - 3 1 3 3   Change in appetite - 2 1 2  (No Data)  Feeling bad or failure about yourself  - 0 0  0 2  Trouble concentrating - 1 0 1 (No Data)  Moving slowly or fidgety/restless - 0 0 0 0  Suicidal thoughts - 0 0 0 0  PHQ-9 Score - 12 3 12 12       Assessment & Plan   1. NICM w/ hx of systolic CHF (congestive heart failure) ([place]) Currently being followed by cards and chf team, appreciate all help - I believe she is at her base weight currently. - cough may still be related to chf, but she states unchanged last 3 months or so. Trial tessalon pearls for now.   - pending repeat echo in 23months, if still w/ persistent CM, will need AICD.  2. Non-rheumatic mitral regurgitation Per cards  3. Cough See #1 - if remains persistent, consider further wkup.     Patient have been counseled extensively about nutrition and exercise  Return in about 3 months (around 10/10/2016), or if symptoms worsen or fail to improve.  The patient was given clear instructions to go to ER or return to medical center if symptoms don't improve, worsen or new problems develop. The patient verbalized understanding. The patient was told to call to get lab results if they haven't heard anything in the next week.   This note has been created with Surveyor, quantity. Any transcriptional errors are unintentional.   Maren Reamer, MD, [place] and [facility] [place], [place]   07/11/2016, 4:06 PM

## 2016-07-11 NOTE — Patient Instructions (Addendum)
Mantenimiento de la salud - Mujeres (Health Maintenance, Female) Un estilo de vida saludable y los cuidados preventivos pueden favorecer considerablemente a la salud y el bienestar. Pregunte a su mdico cul es el cronograma de exmenes peridicos apropiado para usted. Esta es una buena oportunidad para consultarlo sobre cmo prevenir enfermedades y mantenerse sano. Adems de los controles, hay muchas otras cosas que puede hacer usted mismo. Los expertos han realizado numerosas investigaciones sobre los cambios en el estilo de vida y las medidas de prevencin que, muy probablemente, lo ayudarn a mantenerse sano. Solicite a su mdico ms informacin. EL PESO Y LA DIETA Consuma una dieta saludable.   Asegrese de incluir muchas verduras, frutas, productos lcteos de bajo contenido de grasa y protenas magras.  No consuma muchos alimentos de alto contenido de grasas slidas, azcares agregados o sal.  Realice actividad fsica con regularidad. Esta es una de las prcticas ms importantes que puede hacer por su salud.  La mayora de los adultos deben hacer ejercicio durante al menos 150minutos por semana. El ejercicio debe aumentar la frecuencia cardaca y provocar la transpiracin (ejercicio de intensidad moderada).  La mayora de los adultos tambin deben hacer ejercicios de elongacin al menos dos veces a la semana. Agregue esto al su plan de ejercicio de intensidad moderada. Mantenga un peso saludable.   El ndice de masa corporal (IMC) es una medida que puede utilizarse para identificar posibles problemas de peso. Proporciona una estimacin de la grasa corporal basndose en el peso y la altura. Su mdico puede ayudarle a determinar su IMC y a lograr o mantener un peso saludable.  Para las mujeres de 20aos o ms:  Un IMC menor de 18,5 se considera bajo peso.  Un IMC entre 18,5 y 24,9 es normal.  Un IMC entre 25 y 29,9 se considera sobrepeso.  Un IMC de 30 o ms se considera  obesidad. Observe los niveles de colesterol y lpidos en la sangre.   Debe comenzar a realizarse anlisis de lpidos y colesterol en la sangre a los 20aos y luego repetirlos cada 5aos.  Es posible que necesite controlar los niveles de colesterol con mayor frecuencia si:  Sus niveles de lpidos y colesterol son altos.  Es mayor de 50aos.  Presenta un alto riesgo de padecer enfermedades cardacas. DETECCIN DE CNCER Cncer de pulmn   Se recomienda realizar exmenes de deteccin de cncer de pulmn a personas adultas entre 55 y 80 aos que estn en riesgo de desarrollar cncer de pulmn por sus antecedentes de consumo de tabaco.  Se recomienda una tomografa computarizada de baja dosis de los pulmones todos los aos a las personas que:  Fuman actualmente.  Hayan dejado el hbito en algn momento en los ltimos 15aos.  Hayan fumado durante 30aos un paquete diario. Un paquete-ao equivale a fumar un promedio de un paquete de cigarrillos diario durante un ao.  Los exmenes de deteccin anuales deben continuar hasta que hayan pasado 15aos desde que dej de fumar.  Ya no debern realizarse si tiene un problema de salud que le impida recibir tratamiento para el cncer de pulmn. Cncer de mama   Practique la autoconciencia de la mama. Esto significa reconocer la apariencia normal de sus mamas y cmo las siente.  Tambin significa realizar autoexmenes regulares de las mamas. Informe a su mdico sobre cualquier cambio, sin importar cun pequeo sea.  Si tiene entre 20 y 30 aos, un mdico debe realizarle un examen clnico de las mamas como parte del examen regular   de salud, cada 1 a 3aos.  Si tiene 40aos o ms, debe realizarse un examen clnico de las mamas todos los aos. Tambin considere realizarse una radiografa de las mamas (mamografa) todos los aos.  Si tiene antecedentes familiares de cncer de mama, hable con su mdico para someterse a un estudio  gentico.  Si tiene alto riesgo de padecer cncer de mama, hable con su mdico para someterse a una resonancia magntica y una mamografa todos los aos.  La evaluacin del gen del cncer de mama (BRCA) se recomienda a mujeres que tengan familiares con cnceres relacionados con el BRCA. Los cnceres relacionados con el BRCA incluyen los siguientes:  Mama.  Ovario.  Trompas.  Cnceres de peritoneo.  Los resultados de la evaluacin determinarn la necesidad de asesoramiento gentico y de anlisis de BRCA1 y BRCA2. Cncer de cuello del tero  El mdico puede recomendarle que se haga pruebas peridicas de deteccin de cncer de los rganos de la pelvis (ovarios, tero y vagina). Estas pruebas incluyen un examen plvico, que abarca controlar si se produjeron cambios microscpicos en la superficie del cuello del tero (prueba de Papanicolaou). Pueden recomendarle que se haga estas pruebas cada 3aos, a partir de los 21aos.  A las mujeres que tienen entre 30 y 65aos, los mdicos pueden recomendarles que se sometan a exmenes plvicos y pruebas de Papanicolaou cada 3aos, o a la prueba de Papanicolaou y el examen plvico en combinacin con estudios de deteccin del virus del papiloma humano (VPH) cada 5aos. Algunos tipos de VPH aumentan el riesgo de padecer cncer de cuello del tero. La prueba para la deteccin del VPH tambin puede realizarse a mujeres de cualquier edad cuyos resultados de la prueba de Papanicolaou no sean claros.  Es posible que otros mdicos no recomienden exmenes de deteccin a mujeres no embarazadas que se consideran sujetos de bajo riesgo de padecer cncer de pelvis y que no tienen sntomas. Pregntele al mdico si un examen plvico de deteccin es adecuado para usted.  Si ha recibido un tratamiento para el cncer cervical o una enfermedad que podra causar cncer, necesitar realizarse una prueba de Papanicolaou y controles durante al menos 20 aos de concluido el  tratamiento. Si no se ha hecho el Papanicolaou con regularidad, debern volver a evaluarse los factores de riesgo (como tener un nuevo compaero sexual), para determinar si debe realizarse los estudios nuevamente. Algunas mujeres sufren problemas mdicos que aumentan la probabilidad de contraer cncer de cuello del tero. En estos casos, el mdico podr indicar que se realicen controles y pruebas de Papanicolaou con ms frecuencia. Cncer colorrectal   Este tipo de cncer puede detectarse y a menudo prevenirse.  Por lo general, los estudios de rutina se deben comenzar a hacer a partir de los 50 aos y hasta los 75 aos.  Sin embargo, el mdico podr aconsejarle que lo haga antes, si tiene factores de riesgo para el cncer de colon.  Tambin puede recomendarle que use un kit de prueba para hallar sangre oculta en la materia fecal.  Es posible que se use una pequea cmara en el extremo de un tubo para examinar directamente el colon (sigmoidoscopia o colonoscopia) a fin de detectar formas tempranas de cncer colorrectal.  Los exmenes de rutina generalmente comienzan a los 50aos.  El examen directo del colon se debe repetir cada 5 a 10aos hasta los 75aos. Sin embargo, es posible que se realicen exmenes con mayor frecuencia, si se detectan formas tempranas de plipos precancerosos o pequeos bultos.   Cncer de piel   Revise la piel de la cabeza a los pies con regularidad.  Informe a su mdico si aparecen nuevos lunares o los que tiene se modifican, especialmente en su forma y color.  Tambin notifique al mdico si tiene un lunar que es ms grande que el tamao de una goma de lpiz.  Siempre use pantalla solar. Aplique pantalla solar de manera libre y repetida a lo largo del da.  Protjase usando mangas y pantalones largos, un sombrero de ala ancha y gafas para el sol, siempre que se encuentre en el exterior. ENFERMEDADES CARDACAS, DIABETES E HIPERTENSIN ARTERIAL  La hipertensin  arterial causa enfermedades cardacas y aumenta el riesgo de ictus. La hipertensin arterial es ms probable en los siguientes casos:  Las personas que tienen la presin arterial en el extremo del rango normal (100-139/85-89 mm Hg).  Las personas con sobrepeso u obesidad.  Las personas afroamericanas.  Si usted tiene entre 18 y 39 aos, debe medirse la presin arterial cada 3 a 5 aos. Si usted tiene 40 aos o ms, debe medirse la presin arterial todos los aos. Debe medirse la presin arterial dos veces: una vez cuando est en un hospital o una clnica y la otra vez cuando est en otro sitio. Registre el promedio de las dos mediciones. Para controlar su presin arterial cuando no est en un hospital o una clnica, puede usar lo siguiente:  Una mquina automtica para medir la presin arterial en una farmacia.  Un monitor para medir la presin arterial en el hogar.  Si tiene entre 55 y 79 aos, consulte a su mdico si debe tomar aspirina para prevenir el ictus.  Realcese exmenes de deteccin de la diabetes con regularidad. Esto incluye la toma de una muestra de sangre para controlar el nivel de azcar en la sangre durante el ayuno.  Si tiene un peso normal y un bajo riesgo de padecer diabetes, realcese este anlisis cada tres aos despus de los 45aos.  Si tiene sobrepeso y un alto riesgo de padecer diabetes, considere someterse a este anlisis antes o con mayor frecuencia. PREVENCIN DE INFECCIONES HepatitisB   Si tiene un riesgo ms alto de contraer hepatitis B, debe someterse a un examen de deteccin de este virus. Se considera que tiene un alto riesgo de contraer hepatitis B si:  Naci en un pas donde la hepatitis B es frecuente. Pregntele a su mdico qu pases son considerados de alto riesgo.  Sus padres nacieron en un pas de alto riesgo y usted no recibi una vacuna que lo proteja contra la hepatitis B (vacuna contra la hepatitis B).  Tiene VIH o sida.  Usa agujas  para inyectarse drogas.  Vive con alguien que tiene hepatitis B.  Ha tenido sexo con alguien que tiene hepatitis B.  Recibe tratamiento de hemodilisis.  Toma ciertos medicamentos para el cncer, trasplante de rganos y afecciones autoinmunitarias. Hepatitis C   Se recomienda un anlisis de sangre para:  Todos los que nacieron entre 1945 y 1965.  Todas las personas que tengan un riesgo de haber contrado hepatitis C. Enfermedades de transmisin sexual (ETS).   Debe realizarse pruebas de deteccin de enfermedades de transmisin sexual (ETS), incluidas gonorrea y clamidia si:  Es sexualmente activo y es menor de 24aos.  Es mayor de 24aos, y el mdico le informa que corre riesgo de tener este tipo de infecciones.  La actividad sexual ha cambiado desde que le hicieron la ltima prueba de deteccin y tiene un riesgo   tiene riesgo.  Si no tiene el VIH, pero corre riesgo de infectarse por el virus, se recomienda tomar diariamente un medicamento recetado para evitar la infeccin. Esto se conoce como profilaxis previa a la exposicin. Se considera que est en riesgo si:  Es [place] sexualmente y no [place] preservativos habitualmente o no conoce el estado del VIH de sus Chief Strategy Officer.  Se inyecta drogas.  Es [place] sexualmente con Neomia Dear pareja que tiene VIH. Consulte a su mdico para saber si tiene un alto riesgo de infectarse por el VIH. Si opta por comenzar la profilaxis previa a la exposicin, primero debe realizarse anlisis de deteccin del VIH. Luego, le harn anlisis cada mientras est tomando los medicamentos para la profilaxis previa a la exposicin. [facility]  Si es premenopusica y puede quedar [place], solicite a su mdico asesoramiento previo a la concepcin.  Si puede quedar embarazada, tome 400 a (mcg) de cido [employer].  Si desea evitar el embarazo, hable con su mdico sobre el  control de la natalidad (anticoncepcin). OSTEOPOROSIS Y MENOPAUSIA  La osteoporosis es una enfermedad en la que los huesos pierden los minerales y la fuerza por el avance de la edad. El resultado pueden ser fracturas graves en los [place]. El riesgo de osteoporosis puede identificarse con Neomia Dear prueba de densidad sea.  Si tiene 65aos o ms, o si est en riesgo de sufrir osteoporosis y fracturas, pregunte a su mdico si debe someterse a exmenes.  Consulte a su mdico si debe tomar un suplemento de calcio o de vitamina D para reducir el riesgo de osteoporosis.  La menopausia puede presentar ciertos sntomas fsicos y Herbalist.  La terapia de reemplazo hormonal puede reducir algunos de estos sntomas y Herbalist. Consulte a su mdico para saber si la terapia de reemplazo hormonal es conveniente para usted. INSTRUCCIONES PARA EL CUIDADO EN EL HOGAR  Realcese los estudios de rutina de [address], dentales y de Wellsite geologist.  Mantngase al da con las vacunas.  No consuma ningn producto que contenga tabaco, lo que incluye cigarrillos, tabaco de Theatre manager o Administrator, Civil Service.  Si est embarazada, no beba alcohol.  Si est amamantando, reduzca el consumo de alcohol y la frecuencia con la que consume.  Si es mujer y no est embarazada limite el consumo de alcohol a no ms de 1 medida por da. Una medida equivale a 12onzas de cerveza, 5onzas de vino o 1onzas de bebidas alcohlicas de alta graduacin.  No consuma drogas.  No comparta agujas.  Solicite ayuda a su mdico si necesita apoyo o informacin para abandonar las drogas.  Informe a su mdico si a menudo se siente deprimido.  Notifique a su mdico si alguna vez ha sido vctima de abuso o si no se siente seguro en su hogar. Esta informacin no tiene Theme park manager el consejo del mdico. Asegrese de hacerle al mdico cualquier pregunta que tenga. Document Released: 03/09/2011 Document Revised: 04/10/2014 Document Reviewed:  12/22/2014 Elsevier Interactive Patient Education  2017 Elsevier Inc.  -  Insuficiencia cardaca (Heart Failure) Insuficiencia cardaca significa que el corazn tiene problemas para bombear la [place]. Esto dificulta el buen funcionamiento del organismo. La insuficiencia cardaca es una enfermedad de larga duracin (crnica). Es importante que se cuide mucho y que siga el plan de tratamiento que le indique el mdico. CUIDADOS EN EL [employer] medicamentos para el corazn tal como se los prescribi el mdico.  No deje de tomar medicamentos excepto que su mdico lo  indique  No se saltee ninguna dosis del medicamento.  Provase de los medicamentos antes de que se acaben.  Tome medicamentos slo como lo indique su mdico o Development worker, international aid.  Permanezca activo si el mdico lo indica. Las Financial trader y los que tengan insuficiencia cardaca grave deben hablar con el mdico acerca de la actividad fsica.  Consuma alimentos saludables para el corazn. Elija alimentos que no contengan grasas trans y sean bajas en grasas saturadas, colesterol y sal (sodio). Esto incluye frutas frescas o congeladas y vegetales, pescado, carnes [place], productos lcteos sin grasa o bajos en grasa, granos enteros y alimentos ricos en fibra. Las lentejas, arvejas y frijoles (legumbres) son tambin buenas opciones.  Limitar el consumo de sal segn lo aconsejado por su mdico.  Cocine en forma saludable. Prepare los alimentos asados, a la parrilla, al horno, hervidos, al vapor o salteados.  Limite los lquidos segn lo aconsejado por su mdico.  Controle su peso todas las [place]. Hgalo despus de hacer pis (orinar) y antes de tomar el desayuno. Anote su peso para llevarlo a la consulta con el mdico.  Tmese la presin arterial y antela, si su mdico se lo indica.  Pregunte al mdico como controlarse el pulso. Controle su pulso segn las indicaciones.  Baje de peso si el mdico se lo indica.  Deje de  fumar o mascar tabaco. No use goma de Higher education careers adviser o parches para dejar de fumar sin la aprobacin de su mdico.  Programe y concurra a las citas con el mdico segn lo indicado.  Las mujeres no embarazadas no deben tomar ms de 1 bebida al [employer]. Los hombres no deben tomar ms de 2 bebidas al [employer]. Hable con su mdico acerca de su consumo de alcohol.  No consuma drogas.  [place] (immunizaciones).  Controle sus enfermedades segn lo indicado por su mdico.  Aprenda a Engineer, maintenance (IT).  Descanse cuando se sienta cansado.  Si hace mucho calor en el exterior:  Evite las actividades intensas.  Utilice aire acondicionado o ventiladores, o pngase en un lugar ms fresco.  Evite la cafena y el alcohol.  Use ropa holgada, ligera y de colores claros.  Si hace mucho fro en el exterior:  Evite las actividades intensas.  Vstase con ropa en capas.  Use mitones o guantes, un sombrero y [place] bufanda cuando salga.  Evite el alcohol.  Aprenda todo sobre la insuficiencia cardaca y [place] apoyo si lo necesita.  Obtenga ayuda para mantener o mejorar su calidad de vida y su capacidad para cuidarse a s mismo, si lo necesita. SOLICITE AYUDA SI:  Aumenta de peso rpidamente.  Le falta el aire ms que lo habitual.  No puede hacer sus actividades habituales.  Se cansa con facilidad.  Tose ms de lo normal, especialmente al realizar actividad fsica.  Observa que se le hinchan o le aumenta la hinchazn (inflamacin) en reas como las manos, los pies, los tobillos o el vientre (abdomen).  Le cuesta dormir debido a que Film/video editor.  Siente que el corazn palpita rpido (palpitaciones).  Siente mareos o vahdos al pararse. SOLICITE AYUDA DE INMEDIATO SI:  Tiene dificultad para respirar.  Hay un cambio en su estado mental, como estar menos alerta o no poder concentrarse.  Siente dolor u opresin en el pecho.  Se desmaya. ASEGRESE DE  QUE:  Comprende estas instrucciones.  Controlar su enfermedad.  Solicitar ayuda de inmediato si no mejora o si empeora. Esta informacin no tiene  como fin reemplazar el consejo del mdico. Asegrese de hacerle al mdico cualquier pregunta que tenga. Document Released: 06/16/2008 Document Revised: 04/10/2014 Document Reviewed: 05/06/2012 Elsevier Interactive Patient Education  2017 [employer].

## 2016-07-14 ENCOUNTER — Other Ambulatory Visit (HOSPITAL_COMMUNITY): Payer: Self-pay

## 2016-07-14 NOTE — Progress Notes (Signed)
Paramedicine Encounter    Patient ID: Kathy Rosales, female    DOB: 1975/08/12, 41 y.o.   MRN: [id number]   Patient Care Team: Maren Reamer, MD as PCP - General (Internal Medicine) Brunetta Genera, MD as Consulting Physician (Hematology and Oncology) Betty G Martinique, MD (Family Medicine)  Patient Active Problem List   Diagnosis Date Noted  . Acute systolic CHF (congestive heart failure) ([place]) 05/22/2016  . Mitral regurgitation 05/22/2016  . Near syncope 05/22/2016  . Delirium due to multiple etiologies, persistent, mixed level of activity 12/13/2015  . MDD (major depressive disorder), single episode, moderate ([place]) 12/13/2015  . Opioid use disorder, mild, abuse 12/13/2015  . UTI (urinary tract infection) 12/13/2015  . Fatigue 12/01/2015  . Orthostatic hypotension 12/01/2015  . Helicobacter positive gastritis 08/06/2015  . Gastric ulcer 08/06/2015  . Iron deficiency anemia due to chronic blood loss 08/06/2015  . Anemia 07/21/2015  . Iron deficiency anemia 10/22/2014  . B12 deficiency 10/22/2014  . Symptomatic anemia 08/28/2014  . Headache 08/28/2014  . Chest pain 08/28/2014  . 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  albuterol (PROVENTIL HFA;VENTOLIN HFA) 108 (90 Base) MCG/ACT inhaler, Inhale 2 puffs into the lungs every 6 (six) hours as needed for wheezing or shortness of breath., Disp: 1 Inhaler, Rfl: 2 .  aspirin EC 81 MG tablet, Take 1 tablet (81 mg total) by mouth daily., Disp: 90 tablet, Rfl: 3 .  benzonatate (TESSALON PERLES) 100 MG capsule, Take 1 capsule (100 mg total) by mouth 3 (three) times daily as needed for cough., Disp: 90 capsule, Rfl: 3 .  digoxin (LANOXIN) 0.125 MG tablet, Take 1 tablet (0.125 mg total) by mouth daily., Disp: 90 tablet, Rfl: 3 .  furosemide (LASIX) 20 MG tablet, Take 2 tablets (40 mg total) by mouth daily., Disp: 180 tablet, Rfl: 3 .  losartan (COZAAR) 25 MG tablet, Take 0.5 tablets (12.5  mg total) by mouth at bedtime., Disp: 15 tablet, Rfl: 5 .  PARoxetine (PAXIL) 20 MG tablet, Take 20 mg by mouth daily., Disp: , Rfl:  .  potassium chloride 20 MEQ TBCR, Take 20 mEq by mouth daily., Disp: 90 tablet, Rfl: 3 .  riboflavin (VITAMIN B-2) 100 MG TABS tablet, Take 200 mg by mouth daily., Disp: , Rfl:  .  traZODone (DESYREL) 100 MG tablet, Take 200 mg by mouth at bedtime. , Disp: , Rfl:  .  ivabradine (CORLANOR) 5 MG TABS tablet, Take 1 tablet (5 mg total) by mouth 2 (two) times daily with a meal., Disp: 60 tablet, Rfl: 5 Allergies  Allergen Reactions  . Tramadol Itching and Other (See Comments)    Red spots all over body and itching  . Leflunomide Rash     Social History   Social History  . Marital status: Married    Spouse name: N/A  . Number of children: 1  . Years of education: N/A   Occupational History  . unemployeed    Social History Main Topics  . Smoking status: Former Research scientist (life sciences)  . Smokeless tobacco: Never Used  . Alcohol use No  . Drug use: No  . Sexual activity: No   Other Topics Concern  . Not on file   Social History Narrative   ** Merged History Encounter **        Physical Exam  Constitutional: She is oriented to person, place, and time. She appears well-nourished.  Neck: Normal range of motion. No JVD present.  Cardiovascular: Normal rate and regular rhythm.   Pulmonary/Chest: Effort normal and breath sounds normal. No respiratory distress. She has no wheezes. She has no rales.  Abdominal: Soft. She exhibits no distension.  Musculoskeletal: She exhibits no edema.  Neurological: She is alert and oriented to person, place, and time.  Skin: Skin is warm and dry.  Psychiatric: She has a normal mood and affect.        [place] - 07/14/16 1700      Situation   Admitting diagnosis CFH   Heart failure history Exisiting   Comorbidities Anemia;Atrial fibillation;CKD/renal insufficiency   Readmitted within 30 days No   Hospital admission within past  12 months Yes   number of hospital admissions 5   number of ED visits 4   Target Weight 115 lbs     Assessment   Lives alone No   Primary support person Sister   Mode of transportation personal car   Other services involved None   Home equipement Scale     Weight   Weighs self daily Yes   Scale provided No   Records on weight chart Yes     Resources   Has "Living better w/heart failure" book No   Has HF Zone tool No   Able to identify yellow zone signs/when to call MD No   Records zone daily No     Medications   Uses a pill box No   Difficulty obtaining medications Yes   Barriers to medication Getting a ride   Clinical biochemist obtains medications from pharmacy No   Mail order medications No   Missed one or more doses of medications per week No     Nutrition   Patient receives meals on wheels No   Patient follows low sodium diet Yes   Has foods at home that meet the current recommended diet Yes   Patient follows low sugar/card diet Yes   Nutritional concerns/issues None     Activity Level   ADL's/Mobility Independent   How many feet can patient ambulate 20 ft   Typical activity level Active  inactive   Barriers Health    Actvity tolerance: NYHA Class 3     Urine   Difficulty urinating No   Changes in urine None     Time spent with patient   Time spent with patient  [place] - 07/14/16 1600      Outside of House   Sidewalk and pathway to house is level and free from any hazards Yes   Driveway is free from debris/snow/ice Yes   Outside stairs are stable and have sturdy handrail Yes   Porch lights are working and provide adequate lighting Yes     Living Room   Furniture is of adequate height and offers arm rests that assist in getting up and down Yes   Floor is free from any clutter that would create tripping hazards Yes   All cords are either behind furniture or secured in a manner that does not cause  trip hazards Yes   All rugs are secured to floor with double-sided tape N/A   Lighting is adequate to light room Yes   All lighting has an easily accessible on/off switch Yes   Phone is readily accessible near favorite seating areas Yes   Emergency numbers are printed near all phones in house No     Kitchen   Items used most  often are within easy reach on low shelves Yes   Step stool is present, is sturdy and has a handrail Yes   Floor mats are non-slip tread and secured to floor No   Oven controls are within easy reach Yes   Kitchen lighting is adequate and easy to reach switches Yes   ABC fire extinguisher is located in kitchen Yes     [place] is properly secured to stairs and/or all wood is properly secured Yes   Handrail is present and sturdy Yes   Stairs are free from any clutter Yes   Stairway is adequately lit Yes     Bathroom   Tub and shower have a non-slip surface Yes   Tub and/or shower have a grab bar for stability Yes   Toilet has a raised seat No   Grab bar is attached near toilet for assistance No   Pathway from bedroom to bathroom is free from clutter and well lit for ease of movement in the middle of the night Yes     Bedroom   Floor is free from clutter Yes   Light is near bed and is easy to turn on Yes   Phone is next to bed and within easy reach Yes   Flashlight is near bed in case of emergency Yes     General   Smoke detectors in all areas of the house (each floor) and tested Yes   CO detectors on each floor of house and tested No   Flashlights are handy throughout the home Yes   Resident has all medical information readily available and in an area emergency providers will easily find Yes   All heaters are away from any type of flammable material N/A     Overall Tips   Homeowner ha good non-skid shoes to move around house Yes   All assisted walking devices are readily accessible and in good condition Yes   There is a phone near the floor for  ease of reach in case of a fall YES   All O2 tubing is less than 50 ft. and is not a trip hazard N/A   Resident has had an annual hearing and vision check by a physician No   Resident has the proper hearing and visual aids prescribed and are in good working order No   All medications are properly stored and labeled to avoid confusion on dosage, time to take, and avoidance of missed doses Yes       Future Appointments Date Time Provider [place]  07/17/2016 2:45 PM [facility] PHARMACY [facility] None  07/27/2016 3:00 PM [facility] PA/NP [facility] None  09/28/2016 4:20 PM Lelon Perla, MD [facility]  10/17/2016 3:45 PM Arnoldo Morale, MD [facility] None   BP 118/82 (BP Location: Right Arm, Patient Position: Sitting, Cuff Size: Normal)   Pulse 90   Resp 16   Wt 115 lb (52.2 kg)   SpO2 98%   BMI 21.73 kg/m  Weight yesterday-115 lb Last visit weight- Not applicable   Kathy Rosales was seen at home today for her first Paramedicine visit. Flowsheets were completed and "Living With Heart Failure" book was provided. Medications were verified and a pillbox was provided and filled. She reports dizziness since starting Corlanor so I instructed her not to increase her dose (specifics noted in medication section of her chart) until speaking with the clinic on Monday. She has concerns regarding short term memory issues and is interested in trying  OTC supplements but was instructed to wait until Doroteo Bradford could be consulted. I also want to have memory issue followed up at clinic visit with a provider. She also has some concerns with her hearing and vision deficits but has not been evaluated by a physician for same.   Kathy Rosales, EMT 07/14/16  ACTION: Home visit completed Next visit planned for Monday (4/16)

## 2016-07-17 ENCOUNTER — Other Ambulatory Visit (HOSPITAL_COMMUNITY): Payer: Self-pay

## 2016-07-17 ENCOUNTER — Encounter (HOSPITAL_COMMUNITY): Payer: Self-pay

## 2016-07-17 ENCOUNTER — Ambulatory Visit (HOSPITAL_COMMUNITY)
Admission: RE | Admit: 2016-07-17 | Discharge: 2016-07-17 | Disposition: A | Discharge status: Home / Self Care | Payer: BLUE CROSS/BLUE SHIELD | Source: Ambulatory Visit | Attending: Internal Medicine | Admitting: Internal Medicine

## 2016-07-17 VITALS — BP 116/74 | HR 74 | Wt 120.0 lb

## 2016-07-17 DIAGNOSIS — Z79899 Other long term (current) drug therapy: Secondary | ICD-10-CM | POA: Diagnosis not present

## 2016-07-17 DIAGNOSIS — I428 Other cardiomyopathies: Secondary | ICD-10-CM | POA: Insufficient documentation

## 2016-07-17 DIAGNOSIS — I5022 Chronic systolic (congestive) heart failure: Secondary | ICD-10-CM | POA: Insufficient documentation

## 2016-07-17 DIAGNOSIS — I5021 Acute systolic (congestive) heart failure: Secondary | ICD-10-CM

## 2016-07-17 DIAGNOSIS — I340 Nonrheumatic mitral (valve) insufficiency: Secondary | ICD-10-CM | POA: Diagnosis not present

## 2016-07-17 LAB — DIGOXIN LEVEL: Digoxin Level: 0.3 ng/mL — ABNORMAL LOW (ref 0.8–2.0)

## 2016-07-17 LAB — BASIC METABOLIC PANEL
Anion gap: 7 (ref 5–15)
BUN: 14 mg/dL (ref 6–20)
CO2: 26 mmol/L (ref 22–32)
Calcium: 8.9 mg/dL (ref 8.9–10.3)
Chloride: 104 mmol/L (ref 101–111)
Creatinine, Ser: 0.64 mg/dL (ref 0.44–1.00)
GFR calc Af Amer: >60 mL/min (ref >60)
GFR calc non Af Amer: >60 mL/min (ref >60)
Glucose, Bld: 92 mg/dL (ref 65–99)
Potassium: 3.8 mmol/L (ref 3.5–5.1)
Sodium: 137 mmol/L (ref 135–145)

## 2016-07-17 LAB — MAGNESIUM: Magnesium: 2.1 mg/dL (ref 1.7–2.4)

## 2016-07-17 LAB — BRAIN NATRIURETIC PEPTIDE: B Natriuretic Peptide: 426.7 pg/mL — ABNORMAL HIGH (ref 0.0–100.0)

## 2016-07-17 NOTE — Progress Notes (Signed)
HF MD: Treyden Hakim  HPI:  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 No FH of coronary disease.   Diagnosed with CHF and went to her PCP in January 2018 for dyspnea. Says she started getting SOB in the fall. Denies any type of viral illness. No family history of heart disease. Echo was performed and showed reduced EF 20-25% with mod-severe MR. at 25-30%. She was referred to Dr. Stanford Breed who then set her up for a left and right heart cath. Admitted over night on 2/19 after cath due to changes in LOC after cath, was hypotensive. Symptoms resolved, head CT negative. Optimization of her medications was prohibited by hypotension prior to discharge. Started on corlanor.   Today she returns for pharmacist-led HF medication titration. At last HF clinic visit on 06/29/16, she was instructed to reduce dose of losartan to 12.5mg  at bed time and to increase Corlanor to 5 mg BID (which she did not do since she states that she becomes dizzy with this dose). Today she still has complaints of dizziness, SOB, and fatigue. She states that these have worsened over the last week and that she is now sleeping with three pillows (up from 1). Weight at home stable between 122-125 pounds. Lives with herMom. 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   . Shortness of breath/dyspnea on exertion? yes - worse at night . Orthopnea/PND? Yes - now up to 3 pillows  . Edema? no . Lightheadedness/dizziness? Yes - worse in the morning and when she stands up quickly . Daily weights at home? Yes (has a daily log) - 120-125 lb . Blood pressure/heart rate monitoring at home? no . Following low-sodium/fluid-restricted diet? yes  HF Medications: Ivabradine 2.5mg  BID Losartan 12.5mg  daily Digoxin 0.125mg  daily Furosemide 40mg  daily Potassium 77mEq daily  Has the patient been  experiencing any side effects to the medications prescribed?  yes  Does the patient have any problems obtaining medications due to transportation or finances?   No - [employer]   Understanding of regimen: good Understanding of indications: fair Potential of compliance: poor Patient understands to avoid NSAIDs. Patient understands to avoid decongestants.    Pertinent Lab Values: . 4/16: Serum creatinine 0.64, BUN 14, Potassium 3.8, Sodium 137, BNP 426.7, Magnesium 2.1, Digoxin 0.3   Vital Signs: . Weight: 120 lbs (dry weight: 120 lbs) . Blood pressure:  o Sitting: 124/78 mmHg o Standing: 116/74 mmHg . Heart rate: 74 bpm   Assessment: 1. Chronicsystolic CHF (EF 60-45%), due to NICM, mod MR. NYHA class IIIsymptoms.  - Volume status stable   - Still complaining of SOB, fatigue, and dizziness (although not orthostatic) so will continue to hold off on starting BB  - Patient continues to only take ivabradine 2.5mg  (0.5 tablet) BID and says that she does not want to resume a full tablet since she states that it causes increased dizziness  - Continue digoxin 0.125mg  daily, losartan 12.5mg  daily, lasix 40mg  daily, and potassium 20 mEq daily  - Amy Clegg attended visit with [place] today, will get CTX and ECHO before next follow up visit with her - Basic disease state pathophysiology, medication indication, mechanism and side effects reviewed at length with patient and he verbalized understanding 2. Mitral Regurgitation  - Moderate on ECHO 05/2016   - Repeat ECHO before next clinic visit   Plan: 1) Medication changes:  Based on clinical presentation, vital signs and recent labs will continue digoxin 0.125mg  daily, losartan 12.5mg  daily, lasix 40mg  (20mg  tablet) daily, and potassium 20 mEq daily. 2) Labs: BMET, Mg, BNP, digoxin level 3) Follow-up: Darrick Grinder, NP-C 07/27/16  Cruz Condon, PharmD, BCPS PGY2 Pharmacy Resident  Ruta Hinds. Velva Harman, PharmD, BCPS, CPP Clinical  Pharmacist Pager: [phone] Phone: [phone] 07/17/2016 3:09 PM  Agree.  Glori Bickers, MD  3:34 PM

## 2016-07-17 NOTE — Progress Notes (Signed)
Paramedicine Encounter   Patient ID: Kathy Rosales , female,   DOB: [birth year],40 y.o.,  MRN: [id number]   Mikaiya was seen in the clinic today by pharmacy staff and Amy. No changes to record from Friday. Doroteo Bradford has advised her not to take the OTC supplements. Time spent with patient 50 minutes   Jacquiline Doe, EMT-Paramedic 07/17/2016   ACTION: Next visit planned for Friday

## 2016-07-17 NOTE — Patient Instructions (Addendum)
It was great to see you today. Thank you for coming in. (Fue un placer ver la hoy.)  Please continue to take your medications as prescribed. (Por favor continua sus medicamentos.)  We are getting labs today and will follow up if there are any issues. (Analisis de sangre hoy. La llamamos si hay problemas.)  Please make an appointment for CPX and ECHO. (Haga una cita para la prueba de estres y Lawyer.)  You will follow up with Darrick Grinder, NP-C on 07/27/16. (Tiene una cita con Amy Clegg 07/27/16)

## 2016-07-18 ENCOUNTER — Encounter (HOSPITAL_COMMUNITY): Payer: Self-pay | Admitting: Emergency Medicine

## 2016-07-18 ENCOUNTER — Emergency Department (HOSPITAL_COMMUNITY)
Admission: EM | Admit: 2016-07-18 | Discharge: 2016-07-18 | Disposition: A | Discharge status: Home / Self Care | Payer: BLUE CROSS/BLUE SHIELD | Attending: Emergency Medicine | Admitting: Emergency Medicine

## 2016-07-18 ENCOUNTER — Other Ambulatory Visit (HOSPITAL_COMMUNITY): Payer: Self-pay | Admitting: Pharmacist

## 2016-07-18 ENCOUNTER — Encounter (HOSPITAL_COMMUNITY): Payer: Self-pay

## 2016-07-18 ENCOUNTER — Emergency Department (HOSPITAL_COMMUNITY): Payer: BLUE CROSS/BLUE SHIELD

## 2016-07-18 ENCOUNTER — Other Ambulatory Visit (HOSPITAL_COMMUNITY): Payer: Self-pay | Admitting: Adult Health

## 2016-07-18 ENCOUNTER — Other Ambulatory Visit: Payer: Self-pay

## 2016-07-18 ENCOUNTER — Ambulatory Visit (HOSPITAL_COMMUNITY): Payer: BLUE CROSS/BLUE SHIELD

## 2016-07-18 DIAGNOSIS — R200 Anesthesia of skin: Secondary | ICD-10-CM | POA: Diagnosis not present

## 2016-07-18 DIAGNOSIS — Z7901 Long term (current) use of anticoagulants: Secondary | ICD-10-CM | POA: Diagnosis not present

## 2016-07-18 DIAGNOSIS — Z87891 Personal history of nicotine dependence: Secondary | ICD-10-CM | POA: Diagnosis not present

## 2016-07-18 DIAGNOSIS — I5022 Chronic systolic (congestive) heart failure: Secondary | ICD-10-CM | POA: Diagnosis not present

## 2016-07-18 DIAGNOSIS — R55 Syncope and collapse: Secondary | ICD-10-CM | POA: Insufficient documentation

## 2016-07-18 DIAGNOSIS — I5021 Acute systolic (congestive) heart failure: Secondary | ICD-10-CM | POA: Insufficient documentation

## 2016-07-18 DIAGNOSIS — Z79899 Other long term (current) drug therapy: Secondary | ICD-10-CM | POA: Diagnosis not present

## 2016-07-18 DIAGNOSIS — Z7982 Long term (current) use of aspirin: Secondary | ICD-10-CM | POA: Insufficient documentation

## 2016-07-18 DIAGNOSIS — R404 Transient alteration of awareness: Secondary | ICD-10-CM | POA: Diagnosis not present

## 2016-07-18 DIAGNOSIS — I5043 Acute on chronic combined systolic (congestive) and diastolic (congestive) heart failure: Secondary | ICD-10-CM

## 2016-07-18 DIAGNOSIS — R531 Weakness: Secondary | ICD-10-CM | POA: Diagnosis not present

## 2016-07-18 DIAGNOSIS — G40909 Epilepsy, unspecified, not intractable, without status epilepticus: Secondary | ICD-10-CM

## 2016-07-18 LAB — CBC WITH DIFFERENTIAL/PLATELET
Basophils Absolute: 0 10*3/uL (ref 0.0–0.1)
Basophils Relative: 0 %
Eosinophils Absolute: 0.1 10*3/uL (ref 0.0–0.7)
Eosinophils Relative: 1 %
HCT: 40.2 % (ref 36.0–46.0)
Hemoglobin: 13 g/dL (ref 12.0–15.0)
Lymphocytes Relative: 20 %
Lymphs Abs: 1.2 10*3/uL (ref 0.7–4.0)
MCH: 27.5 pg (ref 26.0–34.0)
MCHC: 32.3 g/dL (ref 30.0–36.0)
MCV: 85.2 fL (ref 78.0–100.0)
Monocytes Absolute: 0.3 10*3/uL (ref 0.1–1.0)
Monocytes Relative: 4 %
Neutro Abs: 4.6 10*3/uL (ref 1.7–7.7)
Neutrophils Relative %: 75 %
Platelets: 218 10*3/uL (ref 150–400)
RBC: 4.72 MIL/uL (ref 3.87–5.11)
RDW: 17.6 % — ABNORMAL HIGH (ref 11.5–15.5)
WBC: 6.2 10*3/uL (ref 4.0–10.5)

## 2016-07-18 LAB — URINALYSIS, ROUTINE W REFLEX MICROSCOPIC
Bacteria, UA: NONE SEEN
Bilirubin Urine: NEGATIVE
Glucose, UA: >=500 mg/dL — AB
Ketones, ur: NEGATIVE mg/dL
Leukocytes, UA: NEGATIVE
Nitrite: NEGATIVE
Protein, ur: NEGATIVE mg/dL
Specific Gravity, Urine: 1.022 (ref 1.005–1.030)
pH: 5 (ref 5.0–8.0)

## 2016-07-18 LAB — CBG MONITORING, ED: Glucose-Capillary: 141 mg/dL — ABNORMAL HIGH (ref 65–99)

## 2016-07-18 LAB — I-STAT TROPONIN, ED: Troponin i, poc: 0 ng/mL (ref 0.00–0.08)

## 2016-07-18 LAB — COMPREHENSIVE METABOLIC PANEL
ALT: 21 U/L (ref 14–54)
AST: 26 U/L (ref 15–41)
Albumin: 4 g/dL (ref 3.5–5.0)
Alkaline Phosphatase: 52 U/L (ref 38–126)
Anion gap: 7 (ref 5–15)
BUN: 12 mg/dL (ref 6–20)
CO2: 25 mmol/L (ref 22–32)
Calcium: 8.5 mg/dL — ABNORMAL LOW (ref 8.9–10.3)
Chloride: 107 mmol/L (ref 101–111)
Creatinine, Ser: 0.68 mg/dL (ref 0.44–1.00)
GFR calc Af Amer: >60 mL/min (ref >60)
GFR calc non Af Amer: >60 mL/min (ref >60)
Glucose, Bld: 171 mg/dL — ABNORMAL HIGH (ref 65–99)
Potassium: 3 mmol/L — ABNORMAL LOW (ref 3.5–5.1)
Sodium: 139 mmol/L (ref 135–145)
Total Bilirubin: 0.5 mg/dL (ref 0.3–1.2)
Total Protein: 6.6 g/dL (ref 6.5–8.1)

## 2016-07-18 LAB — I-STAT BETA HCG BLOOD, ED (MC, WL, AP ONLY): I-stat hCG, quantitative: <5 m[IU]/mL (ref <5)

## 2016-07-18 LAB — DIGOXIN LEVEL: Digoxin Level: 0.3 ng/mL — ABNORMAL LOW (ref 0.8–2.0)

## 2016-07-18 LAB — BRAIN NATRIURETIC PEPTIDE: B Natriuretic Peptide: 295.9 pg/mL — ABNORMAL HIGH (ref 0.0–100.0)

## 2016-07-18 MED ORDER — IOPAMIDOL (ISOVUE-370) INJECTION 76%
INTRAVENOUS | Status: AC
  Administered 2016-07-18: 100 mL
  Filled 2016-07-18: qty 100

## 2016-07-18 MED ORDER — LORAZEPAM 2 MG/ML IJ SOLN
1.0000 mg | Freq: Once | INTRAMUSCULAR | Status: DC
Start: 2016-07-18 — End: 2016-07-18
  Filled 2016-07-18: qty 0.5

## 2016-07-18 MED ORDER — SODIUM CHLORIDE 0.9 % IV BOLUS (SEPSIS): 1000.0000 mL | Freq: Once | INTRAVENOUS | Status: DC

## 2016-07-18 MED ORDER — POTASSIUM CHLORIDE CRYS ER 20 MEQ PO TBCR
40.0000 meq | EXTENDED_RELEASE_TABLET | Freq: Once | ORAL | Status: AC
  Administered 2016-07-18: 40 meq via ORAL
  Filled 2016-07-18: qty 2

## 2016-07-18 MED ORDER — KETOROLAC TROMETHAMINE 30 MG/ML IJ SOLN
30.0000 mg | Freq: Once | INTRAMUSCULAR | Status: AC
  Administered 2016-07-18: 30 mg via INTRAVENOUS
  Filled 2016-07-18: qty 1

## 2016-07-18 NOTE — Discharge Instructions (Signed)
Stay hydrated.  Call Dr. Clayborne Dana office for follow up   Return to ER if you have chest pain, trouble breathing, weakness, passing out, trouble speaking.

## 2016-07-18 NOTE — ED Notes (Signed)
Portable XR at bedside

## 2016-07-18 NOTE — Consult Note (Signed)
Neurology Consult Note  Reason for Consultation: Possible seizure  Requesting provider: Glori Bickers, MD  CC: Dizzy  HPI: This is a 41 year old woman who is in the heart failure clinic this afternoon for a routine appointment. History is obtained from the patient but she is only able to offer limited information. I've also reviewed her chart and discussed the case with Dr. Haroldine Laws and his team.   She was scheduled for CPX test today. The test was completed and after finishing the test according to nurses notes she became short of breath, complaining of dizziness with chest pain/pressure. She was then observed to become unresponsive. Vital signs at the time included heart rate 78, systolic blood pressure 765. Blood glucose was measured at 82. She was given 1 amp of D50 and started on normal saline. She was noted to be unresponsive to painful stimuli with intact gag reflex. There is also some fluttering of her eyelids, which were closed. This raised some concern for possible seizure activity and neurology was consulted for further evaluation. No abnormal movements of the arms or legs were seen. The patient was urgently transported to the emergency department.  I met the patient during transfer to the emergency department. She indeed was laying on the stretcher with eyes closed with fine fluttering of both eyelids. When eyes were passively opened, she did not have any gaze deviation. Pupils were reactive. No abnormal movements of the extremities were seen. An ammonia capsule was placed under her nose and she would weakly turn her head away from the stimulus. In the emergency room, she was able to assist nursing when they were trying to address her and put her in a gown. She opened her eyes after she was placed in the room and would fix and track. She began answering questions appropriately with the assistance of an interpreter. Within a few minutes, she tells me that she feels that she is very close  to her normal baseline although she still reports feeling a little bit dizzy.  The patient reports that she has had prior episodes of syncope, usually when she has had blood drawn or procedures performed. She wants to know she can go home.   PMH:  Past Medical History:  Diagnosis Date  . Acute systolic CHF (congestive heart failure) ([place]) 05/22/2016  . Blood transfusion without reported diagnosis   . 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 COLONOSCOPY WITH PROPOFOL N/A 07/23/2015   Procedure: COLONOSCOPY WITH PROPOFOL;  Surgeon: Ronald Lobo, MD;  [facility];  Service: Endoscopy;  Laterality: N/A;  . ESOPHAGOGASTRODUODENOSCOPY (EGD) WITH PROPOFOL N/A 07/23/2015   Procedure: ESOPHAGOGASTRODUODENOSCOPY (EGD) WITH PROPOFOL;  Surgeon: Ronald Lobo, MD;  [facility];  Service: Endoscopy;  Laterality: N/A;  . ESOPHAGOGASTRODUODENOSCOPY (EGD) WITH PROPOFOL N/A 11/18/2015   Procedure: ESOPHAGOGASTRODUODENOSCOPY (EGD) WITH PROPOFOL;  Surgeon: Ronald Lobo, MD;  [facility];  Service: Endoscopy;  Laterality: N/A;  . RIGHT/LEFT HEART CATH AND CORONARY ANGIOGRAPHY N/A 05/22/2016   Procedure: Right/Left Heart Cath and Coronary Angiography;  Surgeon: Peter M Martinique, MD;  Location: [facility];  Service: Cardiovascular;  Laterality: N/A;    Family history: Family History  Problem Relation Age of Onset  . Cancer Paternal Grandfather   . Mental illness Neg Hx     Social history:  Social History   Social History  . Marital status: Married    Spouse name: N/A  . Number of children:  1  . Years of education: N/A   Occupational History  . unemployeed    Social History Main Topics  . Smoking status: Former Research scientist (life sciences)  . Smokeless tobacco: Never Used  . Alcohol use No  . Drug use: No  . Sexual activity: No   Other Topics Concern  . Not on file   Social History Narrative   ** Merged History Encounter **         Current outpatient meds: Medications reviewed and reconciled.  No outpatient prescriptions have been marked as taking for the 07/18/16 encounter [facility] Encounter).    Current inpatient meds: Medications reviewed and reconciled.  No current facility-administered medications for this encounter.    Current Outpatient Prescriptions  Medication Sig Dispense Refill  . albuterol (PROVENTIL HFA;VENTOLIN HFA) 108 (90 Base) MCG/ACT inhaler Inhale 2 puffs into the lungs every 6 (six) hours as needed for wheezing or shortness of breath. 1 Inhaler 2  . aspirin EC 81 MG tablet Take 1 tablet (81 mg total) by mouth daily. 90 tablet 3  . benzonatate (TESSALON PERLES) 100 MG capsule Take 1 capsule (100 mg total) by mouth 3 (three) times daily as needed for cough. 90 capsule 3  . digoxin (LANOXIN) 0.125 MG tablet Take 1 tablet (0.125 mg total) by mouth daily. 90 tablet 3  . furosemide (LASIX) 20 MG tablet Take 2 tablets (40 mg total) by mouth daily. 180 tablet 3  . ivabradine (CORLANOR) 5 MG TABS tablet Take 2.5 mg by mouth 2 (two) times daily with a meal.    . losartan (COZAAR) 25 MG tablet Take 0.5 tablets (12.5 mg total) by mouth at bedtime. 15 tablet 5  . PARoxetine (PAXIL) 20 MG tablet Take 20 mg by mouth daily.    . potassium chloride 20 MEQ TBCR Take 20 mEq by mouth daily. 90 tablet 3  . riboflavin (VITAMIN B-2) 100 MG TABS tablet Take 200 mg by mouth daily.    . traZODone (DESYREL) 100 MG tablet Take 200 mg by mouth at bedtime.     . triamcinolone cream (KENALOG) 0.1 % Apply 1 application topically as needed.  3    Allergies: Allergies  Allergen Reactions  . Tramadol Itching and Other (See Comments)    Red spots all over body and itching  . Leflunomide Rash    ROS: As per HPI. A full 14-point review of systems was performed and is otherwise unremarkable.  PE:  BP 118/83 (BP Location: Left Arm)   Pulse 78   Temp 98.1 F (36.7 C) (Oral)   Resp 20   Ht 5' 2"  (1.575 m)   Wt 54.4  kg (120 lb)   SpO2 100%   BMI 21.95 kg/m   General: WDWN, no acute distress. She is alert, able to follow commands. She is oriented to self, hospital, day. She was unable tell me the year initially. Her voice is quiet but speech is clear, no dysarthria. No aphasia. Affect is flat.  HEENT: Normocephalic. Neck supple without LAD. MMM, OP clear. Dentition good. Sclerae anicteric. No conjunctival injection.  CV: Regular, no murmur. Carotid pulses full and symmetric, no bruits. Distal pulses 2+ and symmetric.  Lungs: CTAB.  Abdomen: Soft, non-distended, non-tender. Bowel sounds present x4.  Extremities: No C/C/E. Neuro:  CN: Pupils are equal and round. They are symmetrically reactive from 3-->2 mm. visual fields appear full. EOMI without nystagmus. No reported diplopia. Facial sensation is intact to light touch. Face is symmetric at rest with normal strength and  mobility. Hearing is intact to conversational voice. Palate elevates symmetrically and uvula is midline. Voice is normal in tone, pitch and quality. Bilateral SCM and trapezii are 5/5. Tongue is midline with normal bulk and mobility.  Motor: Normal bulk, tone. With strength testing, she states that she was unable to lift either leg up off the bed. However, when performing heel-shin, she was able to keep both heels on her shins without assistance. She was noted to have contraction of antagonistic muscle groups during portions of the testing. Strength appears to be normal. No tremor or other abnormal movements. No drift.  Sensation: She reports inconsistent decreases in light touch, initially saying that she didn't feel it on her left leg and then later saying that she didn't feel it on her right leg and right arm. DTRs: 3+, symmetric. Toes downgoing bilaterally. She has positive Hoffmann's bilaterally.  Coordination: Finger-to-nose and heel-to-shin are slow and very deliberate but without dysmetria.   Labs:  Lab Results  Component Value Date    WBC 6.5 06/01/2016   HGB 12.1 06/01/2016   HCT 37.1 06/01/2016   PLT 216 06/01/2016   GLUCOSE 92 07/17/2016   CHOL 149 12/14/2015   TRIG 180 (H) 12/14/2015   HDL 40 (L) 12/14/2015   LDLCALC 73 12/14/2015   ALT 58 (H) 06/01/2016   AST 42 (H) 06/01/2016   NA 137 07/17/2016   K 3.8 07/17/2016   CL 104 07/17/2016   CREATININE 0.64 07/17/2016   BUN 14 07/17/2016   CO2 26 07/17/2016   TSH 2.756 12/10/2015   INR 1.1 05/15/2016   HGBA1C 5.8 (H) 12/14/2015   Labs from today are currently pending  Imaging:  There is no neuro imaging for review.  Assessment and Plan:  1. Spell of decreased consciousness: The episode as described and as witnessed during my evaluation does not appear to be consistent with seizure. She may have had a syncopal event, though portions of her examination strongly suggest somatization. At this point, agree with laboratory workup as outlined by Dr. Darl Householder. She has no objective findings on neurologic examination side think neuroimaging can be deferred at present. She seemed to have cleared nicely and feels like she is essentially back to her baseline apart from some ongoing dizziness. At this point, I don't think any further neurologic workup is necessary.   2. Numbness: The patient reports inconsistent numbness on examination which does not fit any clear physiologic distribution. Monitor.  3. Generalized weakness: Her examination is highly suggestive of somatization with inconsistent effort. She has normal tone and reflexes without evidence of any clear pathologic pattern of weakness. Follow exam. Patient was encouraged that her strength will continue to improve as she recovers from this episode.  This was discussed with the patient via interpreter at the bedside. Education was provided on the diagnosis and expected evaluation and treatment. She is in agreement with the plan as noted. She was given the opportunity to ask any questions and these were addressed to her  satisfaction.   I discussed my above impression with Dr. Darl Householder, ED attending.  Thank you for this consultation. At this time, I have no additional recommendations and will sign off. Please feel free to call with any additional questions or concerns.

## 2016-07-18 NOTE — Progress Notes (Signed)
Patient for scheduled CPX test in [facility] [place]. Patient finished CPX test, became SOB and dizzy with chest pain/pressure. CPX tech Cyril Mourning witnessed patient become unresponsive with palpable pulse 78, SBP 118 palpable, and normal respirations of 16.  CBG checked at 1602, 82.   Oda Kilts PA-C and Dr. Glori Bickers called to the room, patient transferred to stretcher. 22g PIV started x 1 attempt in L hand. 1 amp d50 at 1606, NS 1000cc bag hung to free flow. Patient unresponsive to pains timuli, however did have slight gag reflex. Patient hooked to zoll and transported to ED with Dr. Haroldine Laws at bedside.  Renee Pain, RN

## 2016-07-18 NOTE — ED Provider Notes (Signed)
[facility] DEPT Provider Note   CSN: [id number] Arrival date & time: 07/18/16  1628     History   Chief Complaint Chief Complaint  Patient presents with  . Loss of Consciousness    HPI Kathy Rosales is a 41 y.o. female history of CHF with EF 25 %, Still's disease, here with syncope. Patient was getting a cardiopulmonary exercise tests and then passed out during the procedure. Patient did not remember what happened. Patient was brought in by cardiology. Patient actually recently had a normal right and left heart cath that showed no coronary disease. Patient also was seen by neurology on arrival and had no focal neurologic findings. Patient denies any history of seizures. Denies any chest pain during this procedure.  The history is provided by the patient.   Level V caveat- AMS   Past Medical History:  Diagnosis Date  . Acute systolic CHF (congestive heart failure) ([place]) 05/22/2016  . Blood transfusion without reported diagnosis   . Still's disease [facility])     Patient Active Problem List   Diagnosis Date Noted  . Acute systolic CHF (congestive heart failure) ([place]) 05/22/2016  . Mitral regurgitation 05/22/2016  . Near syncope 05/22/2016  . Delirium due to multiple etiologies, persistent, mixed level of activity 12/13/2015  . MDD (major depressive disorder), single episode, moderate ([place]) 12/13/2015  . Opioid use disorder, mild, abuse 12/13/2015  . UTI (urinary tract infection) 12/13/2015  . Fatigue 12/01/2015  . Orthostatic hypotension 12/01/2015  . Helicobacter positive gastritis 08/06/2015  . Gastric ulcer 08/06/2015  . Iron deficiency anemia due to chronic blood loss 08/06/2015  . Anemia 07/21/2015  . Iron deficiency anemia 10/22/2014  . B12 deficiency 10/22/2014  . Symptomatic anemia 08/28/2014  . Headache 08/28/2014  . Chest pain 08/28/2014  . 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 COLONOSCOPY  WITH PROPOFOL N/A 07/23/2015   Procedure: COLONOSCOPY WITH PROPOFOL;  Surgeon: Ronald Lobo, MD;  [facility];  Service: Endoscopy;  Laterality: N/A;  . ESOPHAGOGASTRODUODENOSCOPY (EGD) WITH PROPOFOL N/A 07/23/2015   Procedure: ESOPHAGOGASTRODUODENOSCOPY (EGD) WITH PROPOFOL;  Surgeon: Ronald Lobo, MD;  [facility];  Service: Endoscopy;  Laterality: N/A;  . ESOPHAGOGASTRODUODENOSCOPY (EGD) WITH PROPOFOL N/A 11/18/2015   Procedure: ESOPHAGOGASTRODUODENOSCOPY (EGD) WITH PROPOFOL;  Surgeon: Ronald Lobo, MD;  [facility];  Service: Endoscopy;  Laterality: N/A;  . RIGHT/LEFT HEART CATH AND CORONARY ANGIOGRAPHY N/A 05/22/2016   Procedure: Right/Left Heart Cath and Coronary Angiography;  Surgeon: Peter M Martinique, MD;  Location: [place] [facility];  Service: Cardiovascular;  Laterality: N/A;    OB History    No data available       Home Medications    Prior to Admission medications   Medication Sig Start Date End Date Taking? Authorizing Provider  albuterol (PROVENTIL HFA;VENTOLIN HFA) 108 (90 Base) MCG/ACT inhaler Inhale 2 puffs into the lungs every 6 (six) hours as needed for wheezing or shortness of breath. 04/05/16  Yes Maren Reamer, MD  aspirin EC 81 MG tablet Take 1 tablet (81 mg total) by mouth daily. 05/15/16  Yes Lelon Perla, MD  benzonatate (TESSALON PERLES) 100 MG capsule Take 1 capsule (100 mg total) by mouth 3 (three) times daily as needed for cough. 07/11/16  Yes Maren Reamer, MD  digoxin (LANOXIN) 0.125 MG tablet Take 1 tablet (0.125 mg total) by mouth daily. 06/09/16  Yes Lelon Perla, MD  furosemide (LASIX) 20 MG tablet  Take 2 tablets (40 mg total) by mouth daily. 06/09/16  Yes Lelon Perla, MD  ivabradine (CORLANOR) 5 MG TABS tablet Take 2.5 mg by mouth 2 (two) times daily with a meal.   Yes Historical Provider, MD  losartan (COZAAR) 25 MG tablet Take 0.5 tablets (12.5 mg total) by mouth at bedtime. 06/29/16  Yes Amy D Clegg, NP    PARoxetine (PAXIL) 20 MG tablet Take 20 mg by mouth daily.   Yes Historical Provider, MD  potassium chloride 20 MEQ TBCR Take 20 mEq by mouth daily. 06/01/16  Yes Amy D Clegg, NP  riboflavin (VITAMIN B-2) 100 MG TABS tablet Take 200 mg by mouth daily.   Yes Historical Provider, MD  traZODone (DESYREL) 100 MG tablet Take 200 mg by mouth at bedtime.    Yes Historical Provider, MD  triamcinolone cream (KENALOG) 0.1 % Apply 1 application topically as needed. 05/25/16  Yes Historical Provider, MD    Family History Family History  Problem Relation Age of Onset  . Cancer Paternal Grandfather   . Mental illness Neg Hx     Social History Social History  Substance Use Topics  . Smoking status: Former Research scientist (life sciences)  . Smokeless tobacco: Never Used  . Alcohol use No     Allergies   Tramadol and Leflunomide   Review of Systems Review of Systems  Neurological: Positive for dizziness and syncope.  All other systems reviewed and are negative.    Physical Exam Updated Vital Signs BP 110/74   Pulse 63   Temp 98.1 F (36.7 C) (Oral)   Resp 20   Ht 5\' 2"  (1.575 m)   Wt 120 lb (54.4 kg)   LMP 06/17/2016 (Approximate)   SpO2 99%   BMI 21.95 kg/m   Physical Exam  Constitutional:  Altered, slow to respond   HENT:  Head: Normocephalic.  Eyes: EOM are normal. Pupils are equal, round, and reactive to light.  No eye deviation   Neck: Normal range of motion. Neck supple.  Cardiovascular: Normal rate, regular rhythm and normal heart sounds.   Pulmonary/Chest: Effort normal and breath sounds normal. No respiratory distress. She has no wheezes. She has no rales.  Abdominal: Soft. Bowel sounds are normal. She exhibits no distension. There is no tenderness.  Musculoskeletal: Normal range of motion.  Neurological:  Altered, slow to respond, moving all extremities.   Skin: Skin is warm.  Psychiatric:  Unable   Nursing note and vitals reviewed.    ED Treatments / Results  Labs (all labs  ordered are listed, but only abnormal results are displayed) Labs Reviewed  CBC WITH DIFFERENTIAL/PLATELET - Abnormal; Notable for the following:       Result Value   RDW 17.6 (*)    All other components within normal limits  COMPREHENSIVE METABOLIC PANEL - Abnormal; Notable for the following:    Potassium 3.0 (*)    Glucose, Bld 171 (*)    Calcium 8.5 (*)    All other components within normal limits  URINALYSIS, ROUTINE W REFLEX MICROSCOPIC - Abnormal; Notable for the following:    Color, Urine YELLOW (*)    APPearance HAZY (*)    Glucose, UA >=500 (*)    Hgb urine dipstick LARGE (*)    Squamous Epithelial / LPF 0-5 (*)    All other components within normal limits  BRAIN NATRIURETIC PEPTIDE - Abnormal; Notable for the following:    B Natriuretic Peptide 295.9 (*)    All other components within normal  limits  DIGOXIN LEVEL - Abnormal; Notable for the following:    Digoxin Level 0.3 (*)    All other components within normal limits  CBG MONITORING, ED - Abnormal; Notable for the following:    Glucose-Capillary 141 (*)    All other components within normal limits  I-STAT TROPOININ, ED  I-STAT BETA HCG BLOOD, ED ([facility], [facility], [facility] ONLY)    EKG  EKG Interpretation None       Radiology Ct Head Wo Contrast  Result Date: 07/18/2016 CLINICAL DATA:  Acute onset of syncope. Left-sided chest pain and shortness of breath. Initial encounter. EXAM: CT HEAD WITHOUT CONTRAST TECHNIQUE: Contiguous axial images were obtained from the base of the skull through the vertex without intravenous contrast. COMPARISON:  CT of the head performed 05/22/2016 FINDINGS: Brain: No evidence of acute infarction, hemorrhage, hydrocephalus, extra-axial collection or mass lesion/mass effect. The posterior fossa, including the cerebellum, brainstem and fourth ventricle, is within normal limits. The third and lateral ventricles, and basal ganglia are unremarkable in appearance. The cerebral hemispheres are symmetric in  appearance, with normal gray-white differentiation. No mass effect or midline shift is seen. Vascular: No hyperdense vessel or unexpected calcification. Skull: There is no evidence of fracture; visualized osseous structures are unremarkable in appearance. Sinuses/Orbits: The orbits are within normal limits. The paranasal sinuses and mastoid air cells are well-aerated. Other: No significant soft tissue abnormalities are seen. IMPRESSION: Unremarkable noncontrast CT of the head. Electronically Signed   By: Garald Balding M.D.   On: 07/18/2016 20:26   Ct Angio Chest Pe W And/or Wo Contrast  Result Date: 07/18/2016 CLINICAL DATA:  Acute onset of left-sided chest pain and syncope. Shortness of breath. Initial encounter. EXAM: CT ANGIOGRAPHY CHEST WITH CONTRAST TECHNIQUE: Multidetector CT imaging of the chest was performed using the standard protocol during bolus administration of intravenous contrast. Multiplanar CT image reconstructions and MIPs were obtained to evaluate the vascular anatomy. CONTRAST:  100 mL of Isovue 370 IV contrast COMPARISON:  Chest radiograph performed earlier today at 5:01 p.m. FINDINGS: Cardiovascular:  There is no evidence of pulmonary embolus. The heart is mildly enlarged. The thoracic aorta is unremarkable. No calcific atherosclerotic disease is seen. The great vessels are within normal limits. Mediastinum/Nodes: The mediastinum is otherwise unremarkable. No mediastinal lymphadenopathy is seen. No pericardial effusion is identified. Residual thymic tissue is grossly unremarkable. The visualized portions of the thyroid gland are unremarkable. No axillary lymphadenopathy is appreciated. Lungs/Pleura: Minimal left basilar atelectasis is noted. No pleural effusion or pneumothorax is seen. No masses are identified. Upper Abdomen: The visualized portions of the liver and spleen are grossly unremarkable. The gallbladder is grossly unremarkable. The visualized portions of the pancreas, adrenal  glands and kidneys are within normal limits. Musculoskeletal: No acute osseous abnormalities are identified. Chronic compression deformities are noted at T8, T9, and T12. The visualized musculature is unremarkable in appearance. Review of the MIP images confirms the above findings. IMPRESSION: 1. No evidence of pulmonary embolus. 2. Minimal left basilar atelectasis noted.  Lungs otherwise clear. 3. Mild cardiomegaly. 4. Chronic compression deformities of T8, T9 and T12. Electronically Signed   By: Garald Balding M.D.   On: 07/18/2016 20:25   Dg Chest Port 1 View  Result Date: 07/18/2016 CLINICAL DATA:  Syncopal episode at heart failure clinic earlier today. EXAM: PORTABLE CHEST 1 VIEW COMPARISON:  04/05/2016. FINDINGS: Marked enlargement of the cardiac silhouette. Clear lung fields. No effusion or pneumothorax. Bones unremarkable. Improved aeration from priors. IMPRESSION: Cardiomegaly.  No infiltrates  or failure. Electronically Signed   By: Staci Righter M.D.   On: 07/18/2016 17:21    Procedures Procedures (including critical care time)  Medications Ordered in ED Medications  ketorolac (TORADOL) 30 MG/ML injection 30 mg (30 mg Intravenous Given 07/18/16 1813)  potassium chloride SA (K-DUR,KLOR-CON) CR tablet 40 mEq (40 mEq Oral Given 07/18/16 1838)  iopamidol (ISOVUE-370) 76 % injection (100 mLs  Contrast Given 07/18/16 1830)     Initial Impression / Assessment and Plan / ED Course  I have reviewed the triage vital signs and the nursing notes.  Pertinent labs & imaging results that were available during my care of the patient were reviewed by me and considered in my medical decision making (see chart for details).    Kathy Rosales is a 41 y.o. female here with possible syncope. Dr. Haroldine Laws at bedside and states that the cardiopulmonary study was limited but grossly normal. She had recent heart cath that was unremarkable and he doesn't see a cardiac reason for syncope. Dr.  Shon Hale at bedside and evaluated patient and doubt a stroke or seizure. Will get labs and UA and reassess.   8:57 PM Labs unremarkable. UA + blood but she is on her period. BNP 295 and CXR unremarkable. I got CT angio given syncope and that was negative. CT head unremarkable. I talked to Dr. Haroldine Laws who doesn't think that its cardiac in origin and doesn't want to do further workup for syncope as she just had extensive workup. Will dc home. Patient now alert and awake and has nonfocal neuro exam.    Final Clinical Impressions(s) / ED Diagnoses   Final diagnoses:  None    New Prescriptions New Prescriptions   No medications on file     Drenda Freeze, MD 07/18/16 [id number]

## 2016-07-18 NOTE — ED Notes (Signed)
Pt CBG was 141, notified Stacy(RN)

## 2016-07-18 NOTE — ED Notes (Signed)
On way to CT 

## 2016-07-18 NOTE — ED Notes (Signed)
Pt insists that she is better and wants to get up and walk to bathroom.  This nurse has advised patient that at this time, patient needs to use bedpan. Refuses.

## 2016-07-18 NOTE — ED Triage Notes (Signed)
Patient had syncopal episode at heart failure clinic.  No head trauma.

## 2016-07-19 MED FILL — Medication: Qty: 1 | Status: AC

## 2016-07-21 ENCOUNTER — Ambulatory Visit (HOSPITAL_COMMUNITY)
Admission: RE | Admit: 2016-07-21 | Discharge: 2016-07-21 | Disposition: A | Discharge status: Home / Self Care | Payer: BLUE CROSS/BLUE SHIELD | Source: Ambulatory Visit | Attending: Internal Medicine | Admitting: Internal Medicine

## 2016-07-21 DIAGNOSIS — E119 Type 2 diabetes mellitus without complications: Secondary | ICD-10-CM | POA: Insufficient documentation

## 2016-07-21 DIAGNOSIS — C9000 Multiple myeloma not having achieved remission: Secondary | ICD-10-CM | POA: Diagnosis not present

## 2016-07-21 DIAGNOSIS — R55 Syncope and collapse: Secondary | ICD-10-CM | POA: Insufficient documentation

## 2016-07-21 DIAGNOSIS — I110 Hypertensive heart disease with heart failure: Secondary | ICD-10-CM | POA: Diagnosis not present

## 2016-07-21 DIAGNOSIS — I081 Rheumatic disorders of both mitral and tricuspid valves: Secondary | ICD-10-CM | POA: Insufficient documentation

## 2016-07-21 DIAGNOSIS — I5021 Acute systolic (congestive) heart failure: Secondary | ICD-10-CM | POA: Diagnosis not present

## 2016-07-21 NOTE — Progress Notes (Signed)
  Echocardiogram 2D Echocardiogram has been performed.  Kathy Rosales 07/21/2016, 4:15 PM

## 2016-07-24 ENCOUNTER — Other Ambulatory Visit (HOSPITAL_COMMUNITY): Payer: Self-pay

## 2016-07-24 NOTE — Progress Notes (Signed)
Paramedicine Encounter    Patient ID: Kathy Rosales, female    DOB: Mar 16, 1976, 41 y.o.   MRN: [id number]   Patient Care Team: Maren Reamer, MD as PCP - General (Internal Medicine) Brunetta Genera, MD as Consulting Physician (Hematology and Oncology) Betty G Martinique, MD (Family Medicine)  Patient Active Problem List   Diagnosis Date Noted  . Acute systolic CHF (congestive heart failure) ([place]) 05/22/2016  . Mitral regurgitation 05/22/2016  . Near syncope 05/22/2016  . Delirium due to multiple etiologies, persistent, mixed level of activity 12/13/2015  . MDD (major depressive disorder), single episode, moderate ([place]) 12/13/2015  . Opioid use disorder, mild, abuse 12/13/2015  . UTI (urinary tract infection) 12/13/2015  . Fatigue 12/01/2015  . Orthostatic hypotension 12/01/2015  . Helicobacter positive gastritis 08/06/2015  . Gastric ulcer 08/06/2015  . Iron deficiency anemia due to chronic blood loss 08/06/2015  . Anemia 07/21/2015  . Iron deficiency anemia 10/22/2014  . B12 deficiency 10/22/2014  . Symptomatic anemia 08/28/2014  . Headache 08/28/2014  . Chest pain 08/28/2014  . 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  albuterol (PROVENTIL HFA;VENTOLIN HFA) 108 (90 Base) MCG/ACT inhaler, Inhale 2 puffs into the lungs every 6 (six) hours as needed for wheezing or shortness of breath., Disp: 1 Inhaler, Rfl: 2 .  aspirin EC 81 MG tablet, Take 1 tablet (81 mg total) by mouth daily., Disp: 90 tablet, Rfl: 3 .  benzonatate (TESSALON PERLES) 100 MG capsule, Take 1 capsule (100 mg total) by mouth 3 (three) times daily as needed for cough., Disp: 90 capsule, Rfl: 3 .  digoxin (LANOXIN) 0.125 MG tablet, Take 1 tablet (0.125 mg total) by mouth daily., Disp: 90 tablet, Rfl: 3 .  furosemide (LASIX) 20 MG tablet, Take 2 tablets (40 mg total) by mouth daily., Disp: 180 tablet, Rfl: 3 .  ivabradine (CORLANOR) 5 MG TABS tablet, Take 2.5 mg by  mouth 2 (two) times daily with a meal., Disp: , Rfl:  .  losartan (COZAAR) 25 MG tablet, Take 0.5 tablets (12.5 mg total) by mouth at bedtime., Disp: 15 tablet, Rfl: 5 .  PARoxetine (PAXIL) 20 MG tablet, Take 20 mg by mouth daily., Disp: , Rfl:  .  potassium chloride 20 MEQ TBCR, Take 20 mEq by mouth daily., Disp: 90 tablet, Rfl: 3 .  riboflavin (VITAMIN B-2) 100 MG TABS tablet, Take 200 mg by mouth daily., Disp: , Rfl:  .  traZODone (DESYREL) 100 MG tablet, Take 200 mg by mouth at bedtime. , Disp: , Rfl:  .  triamcinolone cream (KENALOG) 0.1 %, Apply 1 application topically as needed., Disp: , Rfl: 3 Allergies  Allergen Reactions  . Tramadol Itching and Other (See Comments)    Red spots all over body and itching  . Leflunomide Rash     Social History   Social History  . Marital status: Married    Spouse name: N/A  . Number of children: 1  . Years of education: N/A   Occupational History  . unemployeed    Social History Main Topics  . Smoking status: Former Research scientist (life sciences)  . Smokeless tobacco: Never Used  . Alcohol use No  . Drug use: No  . Sexual activity: No   Other Topics Concern  . Not on file   Social History Narrative   ** Merged History Encounter **        Physical Exam  Constitutional: She is oriented to person, place, and time.  She appears well-developed.  Neck: Normal range of motion. No JVD present.  Cardiovascular: Normal rate and regular rhythm.   Pulmonary/Chest: Effort normal and breath sounds normal. No respiratory distress. She has no wheezes. She has no rales.  Musculoskeletal: Normal range of motion. She exhibits no edema.  Neurological: She is alert and oriented to person, place, and time.  Skin: Skin is warm and dry.        SAFE - 07/14/16 1700      Situation   Admitting diagnosis CFH   Heart failure history Exisiting   Comorbidities Anemia;Atrial fibillation;CKD/renal insufficiency   Readmitted within 30 days No   Hospital admission within  past 12 months Yes   number of hospital admissions 5   number of ED visits 4   Target Weight 115 lbs     Assessment   Lives alone No   Primary support person Sister   Mode of transportation personal car   Other services involved None   Home equipement Scale     Weight   Weighs self daily Yes   Scale provided No   Records on weight chart Yes     Resources   Has "Living better w/heart failure" book No   Has HF Zone tool No   Able to identify yellow zone signs/when to call MD No   Records zone daily No     Medications   Uses a pill box No   Difficulty obtaining medications Yes   Barriers to medication Getting a ride   Clinical biochemist obtains medications from pharmacy No   Mail order medications No   Missed one or more doses of medications per week No     Nutrition   Patient receives meals on wheels No   Patient follows low sodium diet Yes   Has foods at home that meet the current recommended diet Yes   Patient follows low sugar/card diet Yes   Nutritional concerns/issues None     Activity Level   ADL's/Mobility Independent   How many feet can patient ambulate 20 ft   Typical activity level Active  inactive   Barriers Health    Actvity tolerance: NYHA Class 3     Urine   Difficulty urinating No   Changes in urine None     Time spent with patient   Time spent with patient  60 Minutes        Future Appointments Date Time Provider [place]  07/27/2016 3:00 PM [facility] PA/NP [facility] None  09/28/2016 4:20 PM Lelon Perla, MD [facility]  10/17/2016 3:45 PM Arnoldo Morale, MD [facility] None   BP 114/80 (BP Location: Right Arm, Patient Position: Sitting, Cuff Size: Normal)   Pulse 96   Resp 16   Wt 121 lb (54.9 kg)   SpO2 98%   BMI 22.13 kg/m  Weight yesterday- 122 lbs Last visit weight- 115 lbs  Kathy Rosales was seen at home today and reports feeling unwell. She complains of ongoing chest pain and fatigue that has not improved since her  last appointment at the clinic.She says she is taking her medications as prescribed but is still feeling poor. She did say she walked a mile yesterday on the treadmill without feeling SOB but that may be contributing to her fatigue today. Medications were verified and her pillbox was refilled. She has an appointment at the clinic on Thursday and is anxious to find out the results of her echo. Potassium was filled in this week's box  but she needs a refill ordered. Joellen Jersey will handle this.  Jacquiline Doe, EMT 07/24/16   ACTION: Home visit completed Next visit planned for 1 week

## 2016-07-27 ENCOUNTER — Inpatient Hospital Stay (HOSPITAL_COMMUNITY): Admission: RE | Admit: 2016-07-27 | Payer: Self-pay | Source: Ambulatory Visit

## 2016-08-01 ENCOUNTER — Other Ambulatory Visit (HOSPITAL_COMMUNITY): Payer: Self-pay

## 2016-08-01 NOTE — Progress Notes (Signed)
Paramedicine Encounter    Patient ID: Kathy Rosales, female    DOB: 1975-04-22, 41 y.o.   MRN: [id number]    Patient Care Team: Maren Reamer, MD as PCP - General (Internal Medicine) Brunetta Genera, MD as Consulting Physician (Hematology and Oncology) Betty G Martinique, MD (Family Medicine)  Patient Active Problem List   Diagnosis Date Noted  . Acute systolic CHF (congestive heart failure) ([place]) 05/22/2016  . Mitral regurgitation 05/22/2016  . Near syncope 05/22/2016  . Delirium due to multiple etiologies, persistent, mixed level of activity 12/13/2015  . MDD (major depressive disorder), single episode, moderate ([place]) 12/13/2015  . Opioid use disorder, mild, abuse 12/13/2015  . UTI (urinary tract infection) 12/13/2015  . Fatigue 12/01/2015  . Orthostatic hypotension 12/01/2015  . Helicobacter positive gastritis 08/06/2015  . Gastric ulcer 08/06/2015  . Iron deficiency anemia due to chronic blood loss 08/06/2015  . Anemia 07/21/2015  . Iron deficiency anemia 10/22/2014  . B12 deficiency 10/22/2014  . Symptomatic anemia 08/28/2014  . Headache 08/28/2014  . Chest pain 08/28/2014  . 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  aspirin EC 81 MG tablet, Take 1 tablet (81 mg total) by mouth daily., Disp: 90 tablet, Rfl: 3 .  digoxin (LANOXIN) 0.125 MG tablet, Take 1 tablet (0.125 mg total) by mouth daily., Disp: 90 tablet, Rfl: 3 .  furosemide (LASIX) 20 MG tablet, Take 2 tablets (40 mg total) by mouth daily., Disp: 180 tablet, Rfl: 3 .  ivabradine (CORLANOR) 5 MG TABS tablet, Take 2.5 mg by mouth 2 (two) times daily with a meal., Disp: , Rfl:  .  losartan (COZAAR) 25 MG tablet, Take 0.5 tablets (12.5 mg total) by mouth at bedtime., Disp: 15 tablet, Rfl: 5 .  PARoxetine (PAXIL) 20 MG tablet, Take 20 mg by mouth daily., Disp: , Rfl:  .  potassium chloride 20 MEQ TBCR, Take 20 mEq by mouth daily., Disp: 90 tablet, Rfl: 3 .  riboflavin  (VITAMIN B-2) 100 MG TABS tablet, Take 200 mg by mouth daily., Disp: , Rfl:  .  traZODone (DESYREL) 100 MG tablet, Take 200 mg by mouth at bedtime. , Disp: , Rfl:  .  albuterol (PROVENTIL HFA;VENTOLIN HFA) 108 (90 Base) MCG/ACT inhaler, Inhale 2 puffs into the lungs every 6 (six) hours as needed for wheezing or shortness of breath., Disp: 1 Inhaler, Rfl: 2 .  benzonatate (TESSALON PERLES) 100 MG capsule, Take 1 capsule (100 mg total) by mouth 3 (three) times daily as needed for cough., Disp: 90 capsule, Rfl: 3 .  triamcinolone cream (KENALOG) 0.1 %, Apply 1 application topically as needed., Disp: , Rfl: 3 Allergies  Allergen Reactions  . Tramadol Itching and Other (See Comments)    Red spots all over body and itching  . Leflunomide Rash     Social History   Social History  . Marital status: Married    Spouse name: N/A  . Number of children: 1  . Years of education: N/A   Occupational History  . unemployeed    Social History Main Topics  . Smoking status: Former Research scientist (life sciences)  . Smokeless tobacco: Never Used  . Alcohol use No  . Drug use: No  . Sexual activity: No   Other Topics Concern  . Not on file   Social History Narrative   ** Merged History Encounter **        Physical Exam  Eyes: Pupils are equal, round, and reactive to  light.  Pulmonary/Chest: No respiratory distress. She has no wheezes. She has no rales.  Abdominal: She exhibits no distension. There is no tenderness. There is no guarding.  Musculoskeletal: She exhibits edema.  Skin: Skin is warm and dry. She is not diaphoretic.        SAFE - 07/14/16 1700      Situation   Admitting diagnosis CFH   Heart failure history Exisiting   Comorbidities Anemia;Atrial fibillation;CKD/renal insufficiency   Readmitted within 30 days No   Hospital admission within past 12 months Yes   number of hospital admissions 5   number of ED visits 4   Target Weight 115 lbs     Assessment   Lives alone No   Primary support  person Sister   Mode of transportation personal car   Other services involved None   Home equipement Scale     Weight   Weighs self daily Yes   Scale provided No   Records on weight chart Yes     Resources   Has "Living better w/heart failure" book No   Has HF Zone tool No   Able to identify yellow zone signs/when to call MD No   Records zone daily No     Medications   Uses a pill box No   Difficulty obtaining medications Yes   Barriers to medication Getting a ride   Clinical biochemist obtains medications from pharmacy No   Mail order medications No   Missed one or more doses of medications per week No     Nutrition   Patient receives meals on wheels No   Patient follows low sodium diet Yes   Has foods at home that meet the current recommended diet Yes   Patient follows low sugar/card diet Yes   Nutritional concerns/issues None     Activity Level   ADL's/Mobility Independent   How many feet can patient ambulate 20 ft   Typical activity level Active  inactive   Barriers Health    Actvity tolerance: NYHA Class 3     Urine   Difficulty urinating No   Changes in urine None     Time spent with patient   Time spent with patient  60 Minutes        Future Appointments Date Time Provider [place]  08/03/2016 3:30 PM [facility] PA/NP [facility] None  09/28/2016 4:20 PM Lelon Perla, MD [facility]  10/17/2016 3:45 PM Arnoldo Morale, MD [facility] None    ATF pt CAof O x4 sitting in her living room c/o increased SOB, jitteriness, and anxiousness.  Pt stated showed me her weights which she has had a 6 lb increase.  Pt stated that she has noticed that it is getting harder for her to catch her breathe and she's getting tired faster.  Pt stated that she has been taking her medications as prescribed and her pill box is empty.  I asked pt about her eating habits and she stated that she eats a lot of fruit and vegetables.  I advised pt that when she eat a lot of fruits  to be mindful of how much fluids she drinks due to fruits also contains water.  Pt was advised to reduce her fluid intake when she eat a lot of fruits (watermelon was on the counter).Pt's sister stated that pt has been very anxious today because "her mother is here and is concerned about pt's health which is making pt worry even more".  Pt's vitals noted;  no ortho static changes; EKG NSR. Pt denies dizziness, headache, and chest pain.pt has edema in both lower legs +1. The heart clinic notified via voicemail.  I advised pt that I will call her back with any changes after I speak with a nurse tomorrow. rx bottles verified and pill box filled.     Pt had not picked up the potassium prescription because she did not know that it was ready for pick up.  I called and verified that it was ready and she stated that she would pick it up tomorrow.  Pt was shown where and how to place the potassium in slots thurs- tues morning only (her sister witnessed).  ** rx Called in: losartan (authorization requested) about 5 left in bottle   BP 100/70 (BP Location: Left Arm, Patient Position: Sitting, Cuff Size: Normal)   Pulse 94   Wt 127 lb (57.6 kg)   SpO2 98%   BMI 23.23 kg/m   Weight yesterday-126.7 Last visit weight-121  Jaymz Traywick, EMT Paramedic 08/01/2016    ACTION: Home visit completed

## 2016-08-02 ENCOUNTER — Telehealth (HOSPITAL_COMMUNITY): Payer: Self-pay

## 2016-08-02 NOTE — Telephone Encounter (Signed)
I called to advised pt that I spoke with the nurse @ the heart failure clinic and she advised that pt will be evaluated tomorrow during her visit.  Pt's voicemail was full therefore I left a text message.

## 2016-08-03 ENCOUNTER — Ambulatory Visit (HOSPITAL_COMMUNITY)
Admission: RE | Admit: 2016-08-03 | Discharge: 2016-08-03 | Disposition: A | Discharge status: Home / Self Care | Payer: BLUE CROSS/BLUE SHIELD | Source: Ambulatory Visit | Attending: Cardiology | Admitting: Cardiology

## 2016-08-03 ENCOUNTER — Other Ambulatory Visit (HOSPITAL_COMMUNITY): Payer: Self-pay

## 2016-08-03 ENCOUNTER — Encounter (HOSPITAL_COMMUNITY): Payer: Self-pay

## 2016-08-03 VITALS — BP 142/70 | HR 94 | Wt 124.8 lb

## 2016-08-03 DIAGNOSIS — M0820 Juvenile rheumatoid arthritis with systemic onset, unspecified site: Secondary | ICD-10-CM | POA: Diagnosis not present

## 2016-08-03 DIAGNOSIS — Z7982 Long term (current) use of aspirin: Secondary | ICD-10-CM | POA: Diagnosis not present

## 2016-08-03 DIAGNOSIS — Z87891 Personal history of nicotine dependence: Secondary | ICD-10-CM | POA: Insufficient documentation

## 2016-08-03 DIAGNOSIS — I340 Nonrheumatic mitral (valve) insufficiency: Secondary | ICD-10-CM | POA: Diagnosis not present

## 2016-08-03 DIAGNOSIS — I429 Cardiomyopathy, unspecified: Secondary | ICD-10-CM | POA: Diagnosis not present

## 2016-08-03 DIAGNOSIS — F445 Conversion disorder with seizures or convulsions: Secondary | ICD-10-CM | POA: Diagnosis not present

## 2016-08-03 DIAGNOSIS — Z809 Family history of malignant neoplasm, unspecified: Secondary | ICD-10-CM | POA: Diagnosis not present

## 2016-08-03 DIAGNOSIS — I5022 Chronic systolic (congestive) heart failure: Secondary | ICD-10-CM | POA: Insufficient documentation

## 2016-08-03 DIAGNOSIS — E876 Hypokalemia: Secondary | ICD-10-CM | POA: Diagnosis not present

## 2016-08-03 LAB — BRAIN NATRIURETIC PEPTIDE: B Natriuretic Peptide: 278.6 pg/mL — ABNORMAL HIGH (ref 0.0–100.0)

## 2016-08-03 LAB — BASIC METABOLIC PANEL
Anion gap: 7 (ref 5–15)
BUN: 14 mg/dL (ref 6–20)
CO2: 23 mmol/L (ref 22–32)
Calcium: 8.8 mg/dL — ABNORMAL LOW (ref 8.9–10.3)
Chloride: 109 mmol/L (ref 101–111)
Creatinine, Ser: 0.74 mg/dL (ref 0.44–1.00)
GFR calc Af Amer: >60 mL/min (ref >60)
GFR calc non Af Amer: >60 mL/min (ref >60)
Glucose, Bld: 96 mg/dL (ref 65–99)
Potassium: 3.8 mmol/L (ref 3.5–5.1)
Sodium: 139 mmol/L (ref 135–145)

## 2016-08-03 MED ORDER — CARVEDILOL 3.125 MG PO TABS: 3.1250 mg | ORAL_TABLET | Freq: Two times a day (BID) | ORAL | 3 refills | Status: DC

## 2016-08-03 NOTE — Addendum Note (Signed)
Encounter addended by: Shirley Friar, PA-C on: 08/03/2016  4:16 PM<BR>    Actions taken: Sign clinical note

## 2016-08-03 NOTE — Progress Notes (Signed)
Paramedicine Encounter   Patient ID: Kathy Rosales , female,   DOB: July 31, 1975,40 y.o.,  MRN: [id number]   Met patient in clinic today with provider Jonni Sanger.  Pt came to visit with [place] interpreter. Jonni Sanger explained to pt about her heart condition. He also talked with pt about pacemaker and going to see a specialist.  Pt got a EKG during her clinic visit. Pt told Jonni Sanger about the SOB and chest pressure that she has been having since her weight increase.    *added carvedilol; pt can take an extra lasix if weight increases over night Time spent with patient 20  Chellsie Gomer, EMT-Paramedic 08/03/2016   ACTION: Next visit planned for next week with Bertell Karalyn

## 2016-08-03 NOTE — Patient Instructions (Signed)
Start Carvedilol 3.125 mg Twice daily   Take extra 40 mg of Furosemide tomorrow  Labs today  You have been referred to EP cardiologist to get a defibrillator  Your physician recommends that you schedule a follow-up appointment in: 4-6 weeks   Cardiodesfibrilador implantable Radio broadcast assistant Implantation) El cardiodesfibrilador implantable (CDI) es un dispositivo pequeo, alimentado por una batera, que se coloca (implanta) debajo de la piel. El mdico podr prescribirle relajantes musculares.  Usted ha tenido un ritmo cardaco irregular (arritmia) que se origin en las cmaras inferiores del corazn (ventrculos).  Su corazn ha sufrido un daado por una enfermedad (como en las arterias coronarias ), o un problema cardaco (como un infarto). El CDI tiene una batera que dura varios aos, una pequea computadora llamada generador de impulsos y cables conductores que van hacia el corazn. Se utiliza para Hydrographic surveyor y corregir dos tipos de arritmias peligrosas: el ritmo cardaco rpido (taquicardia) y una arritmia en la que los ventrculos se contraen en forma no coordinada (fibrilacin). Cuando un CDI detecta taquicardia, enva una seal elctrica al corazn para restablecer el latido cardaco normal (cardioversin). Esta seal generalmente es indolora. Si la cardioversin no funciona o si el CDI detecta fibrilacin, enva una pequea descarga elctrica al corazn (desfibrilacin) para reiniciar el corazn. La descarga se puede sentir como una fuerte sacudida en el pecho. El CDI se puede programar para corregir otros problemas. A veces, se programan para actuar como otro tipo de dispositivo implantable llamado marcapasos. Los marcapasos se utilizan para tratar un ritmo cardaco lento (bradicardia). INFORME A SU MDICO ACERCA DE:  Si tiene alergias.  Todos los [employer] [place], incluyendo vitaminas, hierbas, gotas oftlmicas, medicamentos de [place] y Proofreader.  Problemas  previos que usted o los [employer] de su familia hayan tenido con el uso de anestsicos.  Enfermedades de la sangre que haya sufrido.  Otros problemas de [place]. RIESGOS Y COMPLICACIONES Generalmente, el procedimiento para implantar un CDI es seguro. Sin embargo, como en todo procedimiento quirrgico, pueden ocurrir complicaciones. Las complicaciones posibles asociadas con el implante del CDI son:  Hinchazn, sangrado o hematoma en el sitio del implante.  Infeccin en el sitio en que fue implantado el CDI.  Una reaccin a los medicamentos utilizados durante el procedimiento.  Enfermedades del corazn o de los vasos sanguneos.  Cogulos sanguneos. ANTES DEL PROCEDIMIENTO  Es posible que tenga que hacerse anlisis de [place], estudios cardacos o una radiografa de trax antes del da del procedimiento.  Consulte a su mdico si debe cambiar o suspender los medicamentos que toma habitualmente.  Haga arreglos para que alguien lo lleve a su casa. Generalmente [facility] [place] despus del procedimiento.  Evite fumar por lo menos 24 horas antes del procedimiento.  Tome un bao o una ducha la noche anterior al procedimiento. Es posible que tenga que higienizarse el trax o el abdomen con un tipo especial de jabn.  No coma ni beba antes del procedimiento por el tiempo que le indique el mdico. Consulte con su mdico si puede tomar los medicamentos con un sorbo de [place]. PROCEDIMIENTO El procedimiento para implantar un CDI en el trax generalmente se realiza en el hospital en una sala que tiene un gran equipo radiolgico llamado fluoroscopio. El equipo estar por encima suyo durante el procedimiento. Esto ayudar al mdico a ver su corazn durante el procedimiento. El implante de un CDI suele demorar entre 1 y 3 horas. Antes del procedimiento  Le colocarn pequeos monitores en  el cuerpo. Ellos controlarn el corazn, la presin arterial y el nivel de  oxgeno.  Se insertar una aguja en una vena de la mano o del brazo. Esto se llama tubo de acceso por va intravenosa (IV). A travs del tubo le inyectarn medicamentos y lquidos por va intravenosa.  Le limpiarn el pecho o el abdomen con una solucin que destruir los grmenes (antisptico) Le rasurarn el rea.  Antes del procedimiento le darn un medicamento para que pueda relajarse (sedante).  Le aplicarn una anestesia para adormecer la zona, llamada anestesia local. Arboriculturist el sitio quirrgico mientras le implantan el CDI. Durante el procedimiento Musician. Luego de anestesiarlo se iniciar el procedimiento. Su mdico har lo siguiente:  Realizar un pequeo corte (incisin). Construir un bolsillo debajo de la piel para Oncologist de impulsos.  Pasar los cables por un vaso sanguneo grande hacia el corazn y los adherir a los msculos del corazn. Segn el tipo de CDI, los cables irn a un ventrculo o a ambos ventrculos y a Public relations account executive superior del corazn (aurcula).  Probar el CDI.  Cerrar la incisin con puntos de sutura, pegamento o grapas. DESPUS DEL PROCEDIMIENTO  Es posible que Corporate treasurer. Es normal sentir Scientist, research (life sciences). Puede durar [employer].  Podr Public affairs consultant en un rea de recuperacin hasta que desaparezca el efecto de la anestesia local. Le controlarn la presin arterial y el pulso con frecuencia. Ser llevado a una habitacin donde controlarn su corazn.  Le tomarn una radiografa de trax. Esto se realiza para verificar que el CDI se Environmental health practitioner.  Podr Field seismologist hospital durante la noche.  Podr verse un ligero bombeo sobre la piel en la que el CDI fue colocado. En algunos casos se podr sentir el CDI por debajo de la piel. Esto es normal.  En los meses y [employer], el mdico controlar el dispositivo, los cables y la batera en forma  peridica. Cuando la batera tenga poca carga, el CDI ser reemplazado. Esta informacin no tiene Marine scientist el consejo del mdico. Asegrese de hacerle al mdico cualquier pregunta que tenga. Document Released: 07/15/2012 Document Revised: 01/08/2013 Document Reviewed: 04/08/2012 Elsevier Interactive Patient Education  2017 [employer].

## 2016-08-03 NOTE — Progress Notes (Addendum)
PCP: Dr. Murrell Redden Primary Cardiologist: Dr. Stanford Breed  HF MD: Dr Aundra Dubin     Advanced Heart Failure Clinic Note   HPI:  [name] is a 41 year old female with a past medical history of NICM EF 25-30% (cath in 05/2016 with normal cors), moderate mitral regurgitation, and tricuspid regurgitation. No FH of coronary disease.   Diagnosed with CHF and  went to her PCP in January 2018 for dyspnea. Says she started getting SOB in the fall. Denies any type of viral illness. No family history of heart disease. Echo was performed and showed reduced EF 20-25% with mod-severe MR. at 25-30%. She was referred to Dr. Stanford Breed who then set her up for a left and right heart cath. Admitted over night on 2/19 after cath due to changes in LOC after cath, was hypotensive. Symptoms resolved, head CT negative. Optimization of her medications was prohibited by hypotension prior to discharge. Started on corlanor.   Had CPX 07/15/16. Results as below. Immediately after, pt sat in chair and then had a pseudoseizure. No tonic/clonic activity. Pt taken to ED with Neurology consulted. Pt recovered spontaneously recovered. CT angio chest and CT head  unremarkable.   She presents today for regular follow up. CPX as below. No further syncope/seizure like activity. Has been having palpitations, weight gain, and more SOB over the past 3 days.  Has been eating a lot of fruit. Trying to watch her fluids and salt.  Weight at home 121 -> 128 lbs. Has not tried any extra lasix. OK with ADLs, but SOB doing much more.  Taking medications as directed.  She occasionally gets lightheaded with rapid standing. Not currently working.    CPX 07/18/16 Results  Pre-Exercise PFTs  FVC 2.79 (92%)    FEV1 2.38 (93%)     FEV1/FVC 85 (101%)     MVV 48 (49%)   Exercise Time:  3:15  Speed (mph): 1.5   Grade (%): 0   RPE: 17 Reason stopped: Patient ended test due to Dyspnea (8/10) Additional symptoms: lightheaded (9/10), chest  pressure (9/10) Resting HR: 84 Peak HR: 95  (53% age predicted max HR) BP rest: 92/62 BP peak: 88/60 Peak VO2: 14.1 (44% predicted peak VO2) VE/VCO2 slope: 59.4 OUES: 0.73 Peak RER: 0.96 Ventilatory Threshold: Not achieved  Peak RR 77 Peak Ventilation: 28.8 VE/MVV: 60% PETCO2 at peak: 28 O2pulse: 8  80% predicted O2pulse)  RHC/LHC 2/19/018  RA 3 PCWP  20 CO/CI 4.43/2.88 LHC normal cors.   12/10/2015 TSH 2.7   ECHO - 04/2016 EF 20-25%. Mod-Severe MR, Mod TR.   SH: Lives with Mom. No alcohol or drug abuse. Does not smoke cigarettes FH: No history of CAD.   Review of systems complete and found to be negative unless listed in HPI.    SH:  Social History   Social History  . Marital status: Married    Spouse name: N/A  . Number of children: 1  . Years of education: N/A   Occupational History  . unemployeed    Social History Main Topics  . Smoking status: Former Research scientist (life sciences)  . Smokeless tobacco: Never Used  . Alcohol use No  . Drug use: No  . Sexual activity: No   Other Topics Concern  . Not on file   Social History Narrative   ** Merged History Encounter **        FH:  Family History  Problem Relation Age of Onset  . Cancer Paternal Grandfather   . Mental illness  Neg Hx     Past Medical History:  Diagnosis Date  . Acute systolic CHF (congestive heart failure) ([place]) 05/22/2016  . Blood transfusion without reported diagnosis   . 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 albuterol (PROVENTIL HFA;VENTOLIN HFA) 108 (90 Base) MCG/ACT inhaler Inhale 2 puffs into the lungs every 6 (six) hours as needed for wheezing or shortness of breath. 1 Inhaler 2  . aspirin EC 81 MG tablet Take 1 tablet (81 mg total) by mouth daily. 90 tablet 3  . benzonatate (TESSALON PERLES) 100 MG capsule Take 1 capsule (100 mg total) by mouth 3 (three) times daily as needed for cough. 90 capsule 3  . digoxin (LANOXIN) 0.125 MG  tablet Take 1 tablet (0.125 mg total) by mouth daily. 90 tablet 3  . furosemide (LASIX) 20 MG tablet Take 2 tablets (40 mg total) by mouth daily. 180 tablet 3  . ivabradine (CORLANOR) 5 MG TABS tablet Take 2.5 mg by mouth 2 (two) times daily with a meal.    . losartan (COZAAR) 25 MG tablet Take 0.5 tablets (12.5 mg total) by mouth at bedtime. 15 tablet 5  . PARoxetine (PAXIL) 20 MG tablet Take 20 mg by mouth daily.    . potassium chloride 20 MEQ TBCR Take 20 mEq by mouth daily. 90 tablet 3  . riboflavin (VITAMIN B-2) 100 MG TABS tablet Take 200 mg by mouth daily.    . traZODone (DESYREL) 100 MG tablet Take 200 mg by mouth at bedtime.     . triamcinolone cream (KENALOG) 0.1 % Apply 1 application topically as needed.  3   No current facility-administered medications for this encounter.     Vitals:   08/03/16 1529  BP: (!) 142/70  Pulse: 94  SpO2: 99%  Weight: 124 lb 12.8 oz (56.6 kg)   Wt Readings from Last 3 Encounters:  08/03/16 124 lb 12.8 oz (56.6 kg)  08/01/16 127 lb (57.6 kg)  07/24/16 121 lb (54.9 kg)    PHYSICAL EXAM: Interpreter present  General: Well appearing. No resp difficulty. HEENT: normal Neck: supple. JVD 8-9. Carotids 2+ bilat; no bruits. No thyromegaly or nodule noted. Cor: PMI nondisplaced. RRR, 2/6 murmur. Lungs: CTAB, normal effort. Abdomen: soft, non-tender, distended, no HSM. No bruits or masses. +BS  Extremities: no cyanosis, clubbing, or rash. Trace ankle edema at most   Neuro: alert & orientedx3, cranial nerves grossly intact. moves all 4 extremities w/o difficulty. Affect pleasant   EKG: NSR 89 bpm  ASSESSMENT & PLAN: 1. Chronic Systolic Heart Failure  NICM. LHC 05/2016 no coronary disease. ECHO 05/2016 EF 20-25% mod MR.  - Echo 07/21/16 LVEF 20-25%, Grade 2 DD, Trivial AI, Mild MR, Mod LAE, PA Peak pressure 31 mm Hg - NYHA IIIB. Volume status mildly elevated at most. Weight up and more SOB. Will have her take 80 mg lasix in the am and then  continue chronic dosing of 40 mg daily.  - Will cautiously add coreg 3.125 mg BID. Making sure to take the second dose at bedtime. May have to stop if feels worse.  - Continue losartan 12.5 mg at bedtime.   - Continue corlanor 5 mg twice a day.  - CPX limited by submaximal effort and pseudoseizure at end of test, but showed severe HF limitation. Unclear how valid test results were.  - Reinforced fluid restriction to < 2 L daily, sodium restriction to less than 2000 mg daily, and  the importance of daily weights.   2. Mitral Regurgitation-  - Mild by Echo 07/21/16  3. Hypokalemia - With labs in ED from syncope several weeks ago.  - BMET today  Shirley Friar, PA-C  3:34 PM   Greater than 50% of the 25 minute visit was spent in counseling/coordination of care regarding disease state education, review of test results, medication reconciliation, and importance of salt/fluid restriction.

## 2016-08-04 ENCOUNTER — Other Ambulatory Visit (HOSPITAL_COMMUNITY): Payer: Self-pay

## 2016-08-04 NOTE — Progress Notes (Signed)
Paramedicine Encounter    Patient ID: Kathy Rosales, female    DOB: 04-Oct-1975, 41 y.o.   MRN: [id number]   Patient Care Team: Maren Reamer, MD as PCP - General (Internal Medicine) Brunetta Genera, MD as Consulting Physician (Hematology and Oncology) Martinique, Betty G, MD (Family Medicine)  Patient Active Problem List   Diagnosis Date Noted  . Hypokalemia 08/03/2016  . Chronic systolic heart failure ([place]) 05/22/2016  . Mitral regurgitation 05/22/2016  . Near syncope 05/22/2016  . Delirium due to multiple etiologies, persistent, mixed level of activity 12/13/2015  . MDD (major depressive disorder), single episode, moderate ([place]) 12/13/2015  . Opioid use disorder, mild, abuse 12/13/2015  . UTI (urinary tract infection) 12/13/2015  . Fatigue 12/01/2015  . Orthostatic hypotension 12/01/2015  . Helicobacter positive gastritis 08/06/2015  . Gastric ulcer 08/06/2015  . Iron deficiency anemia due to chronic blood loss 08/06/2015  . Anemia 07/21/2015  . Iron deficiency anemia 10/22/2014  . B12 deficiency 10/22/2014  . Symptomatic anemia 08/28/2014  . Headache 08/28/2014  . Chest pain 08/28/2014  . 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  albuterol (PROVENTIL HFA;VENTOLIN HFA) 108 (90 Base) MCG/ACT inhaler, Inhale 2 puffs into the lungs every 6 (six) hours as needed for wheezing or shortness of breath., Disp: 1 Inhaler, Rfl: 2 .  aspirin EC 81 MG tablet, Take 1 tablet (81 mg total) by mouth daily., Disp: 90 tablet, Rfl: 3 .  benzonatate (TESSALON PERLES) 100 MG capsule, Take 1 capsule (100 mg total) by mouth 3 (three) times daily as needed for cough., Disp: 90 capsule, Rfl: 3 .  carvedilol (COREG) 3.125 MG tablet, Take 1 tablet (3.125 mg total) by mouth 2 (two) times daily., Disp: 180 tablet, Rfl: 3 .  digoxin (LANOXIN) 0.125 MG tablet, Take 1 tablet (0.125 mg total) by mouth daily., Disp: 90 tablet, Rfl: 3 .  furosemide (LASIX)  20 MG tablet, Take 2 tablets (40 mg total) by mouth daily., Disp: 180 tablet, Rfl: 3 .  ivabradine (CORLANOR) 5 MG TABS tablet, Take 2.5 mg by mouth 2 (two) times daily with a meal., Disp: , Rfl:  .  losartan (COZAAR) 25 MG tablet, Take 0.5 tablets (12.5 mg total) by mouth at bedtime., Disp: 15 tablet, Rfl: 5 .  PARoxetine (PAXIL) 20 MG tablet, Take 20 mg by mouth daily., Disp: , Rfl:  .  potassium chloride 20 MEQ TBCR, Take 20 mEq by mouth daily., Disp: 90 tablet, Rfl: 3 .  riboflavin (VITAMIN B-2) 100 MG TABS tablet, Take 200 mg by mouth daily., Disp: , Rfl:  .  traZODone (DESYREL) 100 MG tablet, Take 200 mg by mouth at bedtime. , Disp: , Rfl:  .  triamcinolone cream (KENALOG) 0.1 %, Apply 1 application topically as needed., Disp: , Rfl: 3 Allergies  Allergen Reactions  . Tramadol Itching and Other (See Comments)    Red spots all over body and itching  . Leflunomide Rash     Social History   Social History  . Marital status: Married    Spouse name: N/A  . Number of children: 1  . Years of education: N/A   Occupational History  . unemployeed    Social History Main Topics  . Smoking status: Former Research scientist (life sciences)  . Smokeless tobacco: Never Used  . Alcohol use No  . Drug use: No  . Sexual activity: No   Other Topics Concern  . Not on file   Social History Narrative   **  Merged History Encounter **        Physical Exam  Constitutional: She is oriented to person, place, and time. She appears well-developed.  Neck: Normal range of motion.  Cardiovascular: Normal rate and regular rhythm.   Pulmonary/Chest: Effort normal and breath sounds normal. No respiratory distress. She has no wheezes. She has no rales.  Musculoskeletal: Normal range of motion. She exhibits no edema.  Neurological: She is alert and oriented to person, place, and time.  Skin: Skin is warm and dry.        SAFE - 07/14/16 1700      Situation   Admitting diagnosis CFH   Heart failure history Exisiting    Comorbidities Anemia;Atrial fibillation;CKD/renal insufficiency   Readmitted within 30 days No   Hospital admission within past 12 months Yes   number of hospital admissions 5   number of ED visits 4   Target Weight 115 lbs     Assessment   Lives alone No   Primary support person Sister   Mode of transportation personal car   Other services involved None   Home equipement Scale     Weight   Weighs self daily Yes   Scale provided No   Records on weight chart Yes     Resources   Has "Living better w/heart failure" book No   Has HF Zone tool No   Able to identify yellow zone signs/when to call MD No   Records zone daily No     Medications   Uses a pill box No   Difficulty obtaining medications Yes   Barriers to medication Getting a ride   Clinical biochemist obtains medications from pharmacy No   Mail order medications No   Missed one or more doses of medications per week No     Nutrition   Patient receives meals on wheels No   Patient follows low sodium diet Yes   Has foods at home that meet the current recommended diet Yes   Patient follows low sugar/card diet Yes   Nutritional concerns/issues None     Activity Level   ADL's/Mobility Independent   How many feet can patient ambulate 20 ft   Typical activity level Active  inactive   Barriers Health    Actvity tolerance: NYHA Class 3     Urine   Difficulty urinating No   Changes in urine None     Time spent with patient   Time spent with patient  60 Minutes        Future Appointments Date Time Provider [place]  08/23/2016 3:30 PM Evans Lance, MD [facility]  09/25/2016 3:20 PM [place] None  09/28/2016 4:20 PM Lelon Perla, MD [facility]  10/17/2016 3:45 PM Arnoldo Morale, MD [facility] None   BP 110/78 (BP Location: Right Arm, Patient Position: Sitting, Cuff Size: Normal)   Pulse 80   Resp 16   Wt 126 lb (57.2 kg)   SpO2 98%   BMI 23.05 kg/m    Patient is seen at home and reports feeling tired. She has concerns over the results of her echocardiogram, because she her heart has not improved. She asked why she is taking the medications if they are not making her better. I explained that her heart failure was not completely reversible and the purpose of the medications were to keep her heart from getting worse and to manage her symptoms. She said she was upset about her appointment yesterday because she  doesn't believe the translator was asking the correct questions for her. She also says that she is out of potassium and the pharmacy said she is not supposed to be due for a refill yet so they would not fill it. I spoke with Jonni Sanger who advised that she would be ok to go without her potassium over the weekend and we would contact the pharmacy Monday.   Jacquiline Doe, EMT 08/04/16  ACTION: Home visit completed Next visit planned for 1 week

## 2016-08-07 ENCOUNTER — Encounter: Payer: Self-pay | Admitting: Internal Medicine

## 2016-08-11 ENCOUNTER — Other Ambulatory Visit (HOSPITAL_COMMUNITY): Payer: Self-pay

## 2016-08-11 NOTE — Progress Notes (Signed)
Paramedicine Encounter    Patient ID: Kathy Rosales, female    DOB: 09-22-1975, 41 y.o.   MRN: [id number]   Patient Care Team: Maren Reamer, MD as PCP - General (Internal Medicine) Brunetta Genera, MD as Consulting Physician (Hematology and Oncology) Martinique, Betty G, MD (Family Medicine)  Patient Active Problem List   Diagnosis Date Noted  . Hypokalemia 08/03/2016  . Chronic systolic heart failure ([place]) 05/22/2016  . Mitral regurgitation 05/22/2016  . Near syncope 05/22/2016  . Delirium due to multiple etiologies, persistent, mixed level of activity 12/13/2015  . MDD (major depressive disorder), single episode, moderate ([place]) 12/13/2015  . Opioid use disorder, mild, abuse 12/13/2015  . UTI (urinary tract infection) 12/13/2015  . Fatigue 12/01/2015  . Orthostatic hypotension 12/01/2015  . Helicobacter positive gastritis 08/06/2015  . Gastric ulcer 08/06/2015  . Iron deficiency anemia due to chronic blood loss 08/06/2015  . Anemia 07/21/2015  . Iron deficiency anemia 10/22/2014  . B12 deficiency 10/22/2014  . Symptomatic anemia 08/28/2014  . Headache 08/28/2014  . Chest pain 08/28/2014  . 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  albuterol (PROVENTIL HFA;VENTOLIN HFA) 108 (90 Base) MCG/ACT inhaler, Inhale 2 puffs into the lungs every 6 (six) hours as needed for wheezing or shortness of breath., Disp: 1 Inhaler, Rfl: 2 .  aspirin EC 81 MG tablet, Take 1 tablet (81 mg total) by mouth daily., Disp: 90 tablet, Rfl: 3 .  carvedilol (COREG) 3.125 MG tablet, Take 1 tablet (3.125 mg total) by mouth 2 (two) times daily., Disp: 180 tablet, Rfl: 3 .  digoxin (LANOXIN) 0.125 MG tablet, Take 1 tablet (0.125 mg total) by mouth daily., Disp: 90 tablet, Rfl: 3 .  furosemide (LASIX) 20 MG tablet, Take 2 tablets (40 mg total) by mouth daily., Disp: 180 tablet, Rfl: 3 .  ivabradine (CORLANOR) 5 MG TABS tablet, Take 2.5 mg by mouth 2 (two)  times daily with a meal., Disp: , Rfl:  .  losartan (COZAAR) 25 MG tablet, Take 0.5 tablets (12.5 mg total) by mouth at bedtime., Disp: 15 tablet, Rfl: 5 .  PARoxetine (PAXIL) 20 MG tablet, Take 20 mg by mouth daily., Disp: , Rfl:  .  potassium chloride 20 MEQ TBCR, Take 20 mEq by mouth daily., Disp: 90 tablet, Rfl: 3 .  riboflavin (VITAMIN B-2) 100 MG TABS tablet, Take 200 mg by mouth daily., Disp: , Rfl:  .  traZODone (DESYREL) 100 MG tablet, Take 200 mg by mouth at bedtime. , Disp: , Rfl:  .  benzonatate (TESSALON PERLES) 100 MG capsule, Take 1 capsule (100 mg total) by mouth 3 (three) times daily as needed for cough. (Patient not taking: Reported on 08/11/2016), Disp: 90 capsule, Rfl: 3 .  triamcinolone cream (KENALOG) 0.1 %, Apply 1 application topically as needed., Disp: , Rfl: 3 Allergies  Allergen Reactions  . Tramadol Itching and Other (See Comments)    Red spots all over body and itching  . Leflunomide Rash     Social History   Social History  . Marital status: Married    Spouse name: N/A  . Number of children: 1  . Years of education: N/A   Occupational History  . unemployeed    Social History Main Topics  . Smoking status: Former Research scientist (life sciences)  . Smokeless tobacco: Never Used  . Alcohol use No  . Drug use: No  . Sexual activity: No   Other Topics Concern  . Not on  file   Social History Narrative   ** Merged History Encounter **        Physical Exam  Constitutional: She is oriented to person, place, and time. She appears well-developed.  Neck: Normal range of motion.  Cardiovascular: Normal rate and regular rhythm.   Pulmonary/Chest: Effort normal and breath sounds normal. No respiratory distress. She has no wheezes. She has no rales.  Musculoskeletal: Normal range of motion. She exhibits no edema.  Neurological: She is alert and oriented to person, place, and time.  Skin: Skin is warm and dry.        SAFE - 07/14/16 1700      Situation   Admitting  diagnosis CFH   Heart failure history Exisiting   Comorbidities Anemia;Atrial fibillation;CKD/renal insufficiency   Readmitted within 30 days No   Hospital admission within past 12 months Yes   number of hospital admissions 5   number of ED visits 4   Target Weight 115 lbs     Assessment   Lives alone No   Primary support person Sister   Mode of transportation personal car   Other services involved None   Home equipement Scale     Weight   Weighs self daily Yes   Scale provided No   Records on weight chart Yes     Resources   Has "Living better w/heart failure" book No   Has HF Zone tool No   Able to identify yellow zone signs/when to call MD No   Records zone daily No     Medications   Uses a pill box No   Difficulty obtaining medications Yes   Barriers to medication Getting a ride   Clinical biochemist obtains medications from pharmacy No   Mail order medications No   Missed one or more doses of medications per week No     Nutrition   Patient receives meals on wheels No   Patient follows low sodium diet Yes   Has foods at home that meet the current recommended diet Yes   Patient follows low sugar/card diet Yes   Nutritional concerns/issues None     Activity Level   ADL's/Mobility Independent   How many feet can patient ambulate 20 ft   Typical activity level Active  inactive   Barriers Health    Actvity tolerance: NYHA Class 3     Urine   Difficulty urinating No   Changes in urine None     Time spent with patient   Time spent with patient  60 Minutes        Future Appointments Date Time Provider [place]  08/23/2016 3:30 PM Evans Lance, MD [facility]  09/25/2016 3:20 PM [place] None  09/28/2016 4:20 PM Lelon Perla, MD [facility]  10/17/2016 3:45 PM Arnoldo Morale, MD [facility] None   BP 106/72 (BP Location: Left Arm, Patient Position: Sitting, Cuff Size: Normal)   Pulse 88   Resp 16   Wt 124  lb 14.4 oz (56.7 kg)   SpO2 97%   BMI 22.84 kg/m  Weight yesterday- 129.8 lbs Last visit weight- 126 lbs  Kathy Rosales was seen at home today and reports feeling tired. She has been on Carvedilol for one week now and says she has been able to tell that she feels like she has less energy. She denied chest pain, SOB, dizziness, H/A or orthopnea. She is able to complete ADL's but has not attempted anything more strenuous. She reports that  she is not sleeping well and wants to contact her PCP about a different sleep aid because she doesn't think the Trazodone is working. She is out of Paxil and furosemide but both were ready at the pharmacy when I called to check. The pharmacy will not be open until Monday and she said she will pick them up then.   Kathy Rosales, EMT 08/11/16  ACTION: Home visit completed Next visit planned for 1 week

## 2016-08-14 ENCOUNTER — Other Ambulatory Visit (HOSPITAL_COMMUNITY): Payer: Self-pay

## 2016-08-14 NOTE — Progress Notes (Signed)
Paramedicine Encounter    Patient ID: Kathy Rosales, female    DOB: 02-19-1976, 41 y.o.   MRN: [id number]   Patient Care Team: Maren Reamer, MD as PCP - General (Internal Medicine) Brunetta Genera, MD as Consulting Physician (Hematology and Oncology) [place]ique, Betty G, MD (Family Medicine)  Patient Active Problem List   Diagnosis Date Noted  . Hypokalemia 08/03/2016  . Chronic systolic heart failure ([place]) 05/22/2016  . Mitral regurgitation 05/22/2016  . Near syncope 05/22/2016  . Delirium due to multiple etiologies, persistent, mixed level of activity 12/13/2015  . MDD (major depressive disorder), single episode, moderate ([place]) 12/13/2015  . Opioid use disorder, mild, abuse 12/13/2015  . UTI (urinary tract infection) 12/13/2015  . Fatigue 12/01/2015  . Orthostatic hypotension 12/01/2015  . Helicobacter positive gastritis 08/06/2015  . Gastric ulcer 08/06/2015  . Iron deficiency anemia due to chronic blood loss 08/06/2015  . Anemia 07/21/2015  . Iron deficiency anemia 10/22/2014  . B12 deficiency 10/22/2014  . Symptomatic anemia 08/28/2014  . Headache 08/28/2014  . Chest pain 08/28/2014  . 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  albuterol (PROVENTIL HFA;VENTOLIN HFA) 108 (90 Base) MCG/ACT inhaler, Inhale 2 puffs into the lungs every 6 (six) hours as needed for wheezing or shortness of breath., Disp: 1 Inhaler, Rfl: 2 .  aspirin EC 81 MG tablet, Take 1 tablet (81 mg total) by mouth daily., Disp: 90 tablet, Rfl: 3 .  carvedilol (COREG) 3.125 MG tablet, Take 1 tablet (3.125 mg total) by mouth 2 (two) times daily., Disp: 180 tablet, Rfl: 3 .  digoxin (LANOXIN) 0.125 MG tablet, Take 1 tablet (0.125 mg total) by mouth daily., Disp: 90 tablet, Rfl: 3 .  furosemide (LASIX) 20 MG tablet, Take 2 tablets (40 mg total) by mouth daily., Disp: 180 tablet, Rfl: 3 .  ivabradine (CORLANOR) 5 MG TABS tablet, Take 2.5 mg by mouth 2 (two)  times daily with a meal., Disp: , Rfl:  .  losartan (COZAAR) 25 MG tablet, Take 0.5 tablets (12.5 mg total) by mouth at bedtime., Disp: 15 tablet, Rfl: 5 .  PARoxetine (PAXIL) 20 MG tablet, Take 20 mg by mouth daily., Disp: , Rfl:  .  potassium chloride 20 MEQ TBCR, Take 20 mEq by mouth daily., Disp: 90 tablet, Rfl: 3 .  riboflavin (VITAMIN B-2) 100 MG TABS tablet, Take 200 mg by mouth daily., Disp: , Rfl:  .  traZODone (DESYREL) 100 MG tablet, Take 200 mg by mouth at bedtime. , Disp: , Rfl:  .  benzonatate (TESSALON PERLES) 100 MG capsule, Take 1 capsule (100 mg total) by mouth 3 (three) times daily as needed for cough. (Patient not taking: Reported on 08/11/2016), Disp: 90 capsule, Rfl: 3 .  triamcinolone cream (KENALOG) 0.1 %, Apply 1 application topically as needed., Disp: , Rfl: 3 Allergies  Allergen Reactions  . Tramadol Itching and Other (See Comments)    Red spots all over body and itching  . Leflunomide Rash     Social History   Social History  . Marital status: Married    Spouse name: N/A  . Number of children: 1  . Years of education: N/A   Occupational History  . unemployeed    Social History Main Topics  . Smoking status: Former Research scientist (life sciences)  . Smokeless tobacco: Never Used  . Alcohol use No  . Drug use: No  . Sexual activity: No   Other Topics Concern  . Not on  file   Social History Narrative   ** Merged History Encounter **        Physical Exam  Constitutional: She is oriented to person, place, and time.  Neck: Normal range of motion.  Cardiovascular: Normal rate and regular rhythm.   Pulmonary/Chest: Effort normal and breath sounds normal. No respiratory distress. She has no wheezes. She has no rales.  Abdominal: She exhibits no distension. There is no tenderness. There is no rebound.  Musculoskeletal: Normal range of motion. She exhibits no edema.  Neurological: She is alert and oriented to person, place, and time.  Skin: Skin is warm and dry.         SAFE - 07/14/16 1700      Situation   Admitting diagnosis CFH   Heart failure history Exisiting   Comorbidities Anemia;Atrial fibillation;CKD/renal insufficiency   Readmitted within 30 days No   Hospital admission within past 12 months Yes   number of hospital admissions 5   number of ED visits 4   Target Weight 115 lbs     Assessment   Lives alone No   Primary support person Sister   Mode of transportation personal car   Other services involved None   Home equipement Scale     Weight   Weighs self daily Yes   Scale provided No   Records on weight chart Yes     Resources   Has "Living better w/heart failure" book No   Has HF Zone tool No   Able to identify yellow zone signs/when to call MD No   Records zone daily No     Medications   Uses a pill box No   Difficulty obtaining medications Yes   Barriers to medication Getting a ride   Clinical biochemist obtains medications from pharmacy No   Mail order medications No   Missed one or more doses of medications per week No     Nutrition   Patient receives meals on wheels No   Patient follows low sodium diet Yes   Has foods at home that meet the current recommended diet Yes   Patient follows low sugar/card diet Yes   Nutritional concerns/issues None     Activity Level   ADL's/Mobility Independent   How many feet can patient ambulate 20 ft   Typical activity level Active  inactive   Barriers Health    Actvity tolerance: NYHA Class 3     Urine   Difficulty urinating No   Changes in urine None     Time spent with patient   Time spent with patient  60 Minutes        Future Appointments Date Time Provider [place]  08/21/2016 3:00 PM Lottie Mussel T, MD [facility] None  08/23/2016 3:30 PM Evans Lance, MD [facility]  09/25/2016 3:20 PM [place] None  09/28/2016 4:20 PM Lelon Perla, MD [facility]  10/17/2016 3:45 PM Arnoldo Morale, MD [facility] None   BP  100/70 (BP Location: Right Arm, Patient Position: Sitting, Cuff Size: Normal)   Pulse 84   Resp 16   Wt 124 lb (56.2 kg)   SpO2 97%   BMI 22.68 kg/m  Weight yesterday- 123 lbs Last visit weight- 124 lbs  Kathy Rosales was seen at home today and reports feeling well. She said she had some minor ankle swelling over the weekend but it resolved without intervention. She has been out of lasix since Friday because she forgot to pick it up  from the pharmacy. Today she had all her medications so her pillbox was filled completely. We contacted her PCP as well in order to schedule an appointment to address her concerns with memory and her trazodone.   Kathy Rosales, EMT 08/14/16  ACTION: Home visit completed Next visit planned for 1 week

## 2016-08-17 ENCOUNTER — Encounter: Payer: Self-pay | Admitting: Internal Medicine

## 2016-08-18 ENCOUNTER — Encounter: Payer: Self-pay | Admitting: Internal Medicine

## 2016-08-21 ENCOUNTER — Encounter: Payer: Self-pay | Admitting: Internal Medicine

## 2016-08-21 ENCOUNTER — Ambulatory Visit: Payer: BLUE CROSS/BLUE SHIELD | Attending: Internal Medicine | Admitting: Internal Medicine

## 2016-08-21 ENCOUNTER — Other Ambulatory Visit (HOSPITAL_COMMUNITY): Payer: Self-pay

## 2016-08-21 VITALS — BP 108/69 | HR 65 | Temp 98.0°F | Resp 16 | Wt 128.0 lb

## 2016-08-21 DIAGNOSIS — F321 Major depressive disorder, single episode, moderate: Secondary | ICD-10-CM | POA: Diagnosis not present

## 2016-08-21 DIAGNOSIS — M0820 Juvenile rheumatoid arthritis with systemic onset, unspecified site: Secondary | ICD-10-CM | POA: Diagnosis not present

## 2016-08-21 DIAGNOSIS — I429 Cardiomyopathy, unspecified: Secondary | ICD-10-CM | POA: Insufficient documentation

## 2016-08-21 DIAGNOSIS — F329 Major depressive disorder, single episode, unspecified: Secondary | ICD-10-CM | POA: Diagnosis not present

## 2016-08-21 DIAGNOSIS — I5022 Chronic systolic (congestive) heart failure: Secondary | ICD-10-CM | POA: Insufficient documentation

## 2016-08-21 DIAGNOSIS — Z7982 Long term (current) use of aspirin: Secondary | ICD-10-CM | POA: Insufficient documentation

## 2016-08-21 DIAGNOSIS — R5383 Other fatigue: Secondary | ICD-10-CM | POA: Diagnosis present

## 2016-08-21 MED ORDER — PAROXETINE HCL 30 MG PO TABS: 30.0000 mg | ORAL_TABLET | Freq: Every day | ORAL | 3 refills | Status: DC

## 2016-08-21 NOTE — Progress Notes (Signed)
Paramedicine Encounter    Patient ID: Kathy Rosales, female    DOB: Aug 28, 1975, 41 y.o.   MRN: [id number]   Patient Care Team: Kathy Rosales as PCP - General (Internal Medicine) Kathy Rosales as Consulting Physician (Hematology and Oncology) Kathy Rosales (Family Medicine)  Patient Active Problem List   Diagnosis Date Noted  . Hypokalemia 08/03/2016  . Chronic systolic heart failure ([place]) 05/22/2016  . Mitral regurgitation 05/22/2016  . Near syncope 05/22/2016  . Delirium due to multiple etiologies, persistent, mixed level of activity 12/13/2015  . MDD (major depressive disorder), single episode, moderate ([place]) 12/13/2015  . Opioid use disorder, mild, abuse 12/13/2015  . UTI (urinary tract infection) 12/13/2015  . Fatigue 12/01/2015  . Orthostatic hypotension 12/01/2015  . Helicobacter positive gastritis 08/06/2015  . Gastric ulcer 08/06/2015  . Iron deficiency anemia due to chronic blood loss 08/06/2015  . Anemia 07/21/2015  . Iron deficiency anemia 10/22/2014  . B12 deficiency 10/22/2014  . Symptomatic anemia 08/28/2014  . Headache 08/28/2014  . Chest pain 08/28/2014  . 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  albuterol (PROVENTIL HFA;VENTOLIN HFA) 108 (90 Base) MCG/ACT inhaler, Inhale 2 puffs into the lungs every 6 (six) hours as needed for wheezing or shortness of breath., Disp: 1 Inhaler, Rfl: 2 .  aspirin EC 81 MG tablet, Take 1 tablet (81 mg total) by mouth daily., Disp: 90 tablet, Rfl: 3 .  carvedilol (COREG) 3.125 MG tablet, Take 1 tablet (3.125 mg total) by mouth 2 (two) times daily., Disp: 180 tablet, Rfl: 3 .  digoxin (LANOXIN) 0.125 MG tablet, Take 1 tablet (0.125 mg total) by mouth daily., Disp: 90 tablet, Rfl: 3 .  furosemide (LASIX) 20 MG tablet, Take 2 tablets (40 mg total) by mouth daily., Disp: 180 tablet, Rfl: 3 .  ivabradine (CORLANOR) 5 MG TABS tablet, Take 2.5 mg by mouth 2 (two)  times daily with a meal., Disp: , Rfl:  .  losartan (COZAAR) 25 MG tablet, Take 0.5 tablets (12.5 mg total) by mouth at bedtime., Disp: 15 tablet, Rfl: 5 .  PARoxetine (PAXIL) 30 MG tablet, Take 1 tablet (30 mg total) by mouth daily., Disp: 30 tablet, Rfl: 3 .  potassium chloride 20 MEQ TBCR, Take 20 mEq by mouth daily., Disp: 90 tablet, Rfl: 3 .  riboflavin (VITAMIN B-2) 100 MG TABS tablet, Take 200 mg by mouth daily., Disp: , Rfl:  .  traZODone (DESYREL) 100 MG tablet, Take 200 mg by mouth at bedtime. , Disp: , Rfl:  .  benzonatate (TESSALON PERLES) 100 MG capsule, Take 1 capsule (100 mg total) by mouth 3 (three) times daily as needed for cough. (Patient not taking: Reported on 08/11/2016), Disp: 90 capsule, Rfl: 3 .  triamcinolone cream (KENALOG) 0.1 %, Apply 1 application topically as needed., Disp: , Rfl: 3 Allergies  Allergen Reactions  . Tramadol Itching and Other (See Comments)    Red spots all over body and itching  . Leflunomide Rash     Social History   Social History  . Marital status: Married    Spouse name: N/A  . Number of children: 1  . Years of education: N/A   Occupational History  . unemployeed    Social History Main Topics  . Smoking status: Former Research scientist (life sciences)  . Smokeless tobacco: Never Used  . Alcohol use No  . Drug use: No  . Sexual activity: No   Other Topics Concern  .  Not on file   Social History Narrative   ** Merged History Encounter **        Physical Exam  Constitutional: She is oriented to person, place, and time. She appears well-developed.  Neck: Normal range of motion.  Cardiovascular: Normal rate and regular rhythm.   Pulmonary/Chest: Effort normal and breath sounds normal. No respiratory distress. She has no wheezes. She has no rales.  Abdominal: Soft.  Musculoskeletal: Normal range of motion. She exhibits no edema.  Neurological: She is alert and oriented to person, place, and time.  Skin: Skin is warm and dry.  Psychiatric: She  exhibits a depressed mood.        SAFE - 07/14/16 1700      Situation   Admitting diagnosis CFH   Heart failure history Exisiting   Comorbidities Anemia;Atrial fibillation;CKD/renal insufficiency   Readmitted within 30 days No   Hospital admission within past 12 months Yes   number of hospital admissions 5   number of ED visits 4   Target Weight 115 lbs     Assessment   Lives alone No   Primary support person Sister   Mode of transportation personal car   Other services involved Rosales   Home equipement Scale     Weight   Weighs self daily Yes   Scale provided No   Records on weight chart Yes     Resources   Has "Living better w/heart failure" book No   Has HF Zone tool No   Able to identify yellow zone signs/when to call Rosales No   Records zone daily No     Medications   Uses a pill box No   Difficulty obtaining medications Yes   Barriers to medication Getting a ride   Clinical biochemist obtains medications from pharmacy No   Mail order medications No   Missed one or more doses of medications per week No     Nutrition   Patient receives meals on wheels No   Patient follows low sodium diet Yes   Has foods at home that meet the current recommended diet Yes   Patient follows low sugar/card diet Yes   Nutritional concerns/issues Rosales     Activity Level   ADL's/Mobility Independent   How many feet can patient ambulate 20 ft   Typical activity level Active  inactive   Barriers Health    Actvity tolerance: NYHA Class 3     Urine   Difficulty urinating No   Changes in urine Rosales     Time spent with patient   Time spent with patient  60 Minutes        Future Appointments Date Time Provider [place]  08/23/2016 3:30 PM Kathy Rosales [facility]  09/18/2016 3:45 PM Kathy Rosales [facility] [place]  09/25/2016 3:20 PM Kathy Rosales  09/28/2016 4:20 PM Kathy Rosales [facility]   BP 124/84 (BP  Location: Right Arm, Patient Position: Sitting, Cuff Size: Normal)   Pulse 62   Resp 16   Wt 127 lb (57.6 kg)   SpO2 98%   BMI 23.23 kg/m  Weight yesterday- 125 lbs Last visit weight- 124 lbs  Kathy Rosales was seen a home today and reports feeling low energy and having SOB, CP, nausea and diaphoresis while trying to complete ADL's. She says these symptoms take about 10-15 minute to resolve after coming to a rest. She reports following a low sodium diet and limiting her fluid  intake. Medications were verified and her pillbox was filled. Digoxin needs to be ordered but there is enough to get her through the weekend. She also told me today that she is feeling depressed and some days she is having trouble getting out of bed. She denied SI. I let her know that I would talk with Kennyth Lose about the depression and also advised her to contact her PCP about a medication adjustment. Also need to [facility] and see [facility] can put spanish labels on her medications net time they are filled.  Jacquiline Doe, EMT 08/21/16  ACTION:  Home visit completed Next visit planned for 1 week

## 2016-08-21 NOTE — Progress Notes (Signed)
Kathy Rosales, is a 41 y.o. female  UUV:[id number]  KVQ:[id number]  DOB - October 21, 1975  Chief Complaint  Patient presents with  . Fatigue        Subjective:   Kathy Rosales is a 41 y.o. female here today for a follow up visit for chf and MDD/depression. Overall things are ok,  But c/o of increasing forgetfulness and decrease energy for that month or so.  Denies n/v/appetite problems. Sex life is better as well. She last saw Psychiatry about 54months ago, but has been on same paxil dose of 20mg  for almost a year. Denies si/hi/avh.  Of note, saw cards 08/03/16, coreg 3.136mb bid as added at the time.  She is still on lasix - but only 40mg  qd, losartan and corlanor.  She has ep appt this Wed.  - Echo 07/21/16 LVEF 20-25%, Grade 2 DD, Trivial AI, Mild MR, Mod LAE, PA Peak pressure 31 mm Hg.   Patient has No headache, No chest pain, No abdominal pain - No Nausea, No new weakness tingling or numbness, No Cough - SOB. Denies orthopnea/syncope.  Pt's bf here to interpret.  No problems updated.  ALLERGIES: Allergies  Allergen Reactions  . Tramadol Itching and Other (See Comments)    Red spots all over body and itching  . Leflunomide Rash    PAST MEDICAL HISTORY: Past Medical History:  Diagnosis Date  . Acute systolic CHF (congestive heart failure) ([place]) 05/22/2016  . Blood transfusion without reported diagnosis   . Still's disease [facility])     MEDICATIONS AT HOME: Prior to Admission medications   Medication Sig Start Date End Date Taking? Authorizing Provider  albuterol (PROVENTIL HFA;VENTOLIN HFA) 108 (90 Base) MCG/ACT inhaler Inhale 2 puffs into the lungs every 6 (six) hours as needed for wheezing or shortness of breath. 04/05/16   Maren Reamer, MD  aspirin EC 81 MG tablet Take 1 tablet (81 mg total) by mouth daily. 05/15/16   Lelon Perla, MD  benzonatate (TESSALON PERLES) 100 MG capsule Take 1 capsule (100 mg total) by mouth 3 (three) times daily as needed  for cough. Patient not taking: Reported on 08/11/2016 07/11/16   Lottie Mussel T, MD  carvedilol (COREG) 3.125 MG tablet Take 1 tablet (3.125 mg total) by mouth 2 (two) times daily. 08/03/16 11/01/16  Larey Dresser, MD  digoxin (LANOXIN) 0.125 MG tablet Take 1 tablet (0.125 mg total) by mouth daily. 06/09/16   Lelon Perla, MD  furosemide (LASIX) 20 MG tablet Take 2 tablets (40 mg total) by mouth daily. 06/09/16   Lelon Perla, MD  ivabradine (CORLANOR) 5 MG TABS tablet Take 2.5 mg by mouth 2 (two) times daily with a meal.    [provider]  losartan (COZAAR) 25 MG tablet Take 0.5 tablets (12.5 mg total) by mouth at bedtime. 06/29/16   Clegg, Amy D, NP  PARoxetine (PAXIL) 30 MG tablet Take 1 tablet (30 mg total) by mouth daily. 08/21/16   Maren Reamer, MD  potassium chloride 20 MEQ TBCR Take 20 mEq by mouth daily. 06/01/16   Clegg, Amy D, NP  riboflavin (VITAMIN B-2) 100 MG TABS tablet Take 200 mg by mouth daily.    [provider]  traZODone (DESYREL) 100 MG tablet Take 200 mg by mouth at bedtime.     [provider]  triamcinolone cream (KENALOG) 0.1 % Apply 1 application topically as needed. 05/25/16   [provider]     Objective:  Vitals:   08/21/16 1556  BP: 108/69  Pulse: 65  Resp: 16  Temp: 98 F (36.7 C)  TempSrc: Oral  SpO2: 98%  Weight: 128 lb (58.1 kg)    Exam General appearance : Awake, alert, not in any distress. Speech Clear. Not toxic looking, pleasant HEENT: Atraumatic and Normocephalic, pupils equally reactive to light. Neck: supple, no JVD.  Chest:Good air entry bilaterally, no added sounds. CVS: S1 S2 regular, no murmurs/gallups or rubs. Abdomen: Bowel sounds active, Non tender and not distended with no gaurding, rigidity or rebound. Extremities: B/L Lower Ext shows no edema, both legs are warm to touch Neurology: Awake alert, and oriented X 3, CN II-XII grossly intact, Non focal Skin:No Rash  Data Review Lab  Results  Component Value Date   HGBA1C 5.8 (H) 12/14/2015   HGBA1C 4.3 (L) 09/29/2015    Depression screen PHQ 2/9 08/21/2016 07/11/2016 05/09/2016 04/14/2016 04/14/2016  Decreased Interest 3 0 2 0 1  Down, Depressed, Hopeless 1 1 1  0 2  PHQ - 2 Score 4 1 3  0 3  Altered sleeping 1 - 3 1 3   Tired, decreased energy 3 - 3 1 3   Change in appetite 3 - 2 1 2   Feeling bad or failure about yourself  0 - 0 0 0  Trouble concentrating 3 - 1 0 1  Moving slowly or fidgety/restless 0 - 0 0 0  Suicidal thoughts 0 - 0 0 0  PHQ-9 Score 14 - 12 3 12       Assessment & Plan   1. Chronic systolic heart failure (HCC) Stable, MR improved but ef remains 20-25% on echo 07/21/16 - coreg 3.125bid was added 08/03/16  2. Nonischemic cardiomyopathy - lhc/rhc nml coronaries 05/22/16 - on digoxin as well - has appt on Wed w/ EP for eval for ICD. - reassured pt that scarring will hopefully be minimal  3. MDD (major depressive disorder), single episode, moderate ([place]) - new c/o of increasing fatigue and memory deficits - not certain if this is related to her MDD, vs new rx coreg. - defer coreg to cards for now - will increase paxil to 30mg  qd - recd pt fu w/ psychiatry as well.  4. Hx of sexual problems/adhedonia  Per pt and bf, now much improved.     Patient have been counseled extensively about nutrition and exercise  Return in about 4 weeks (around 09/18/2016) for fatigue/forgetfulness/chf.  The patient was given clear instructions to go to ER or return to medical center if symptoms don't improve, worsen or new problems develop. The patient verbalized understanding. The patient was told to call to get lab results if they haven't heard anything in the next week.   This note has been created with Surveyor, quantity. Any transcriptional errors are unintentional.   Maren Reamer, MD, [place] [facility] [place],  [place]   08/21/2016, 4:15 PM

## 2016-08-21 NOTE — Patient Instructions (Signed)
Major Depressive Disorder, Adult Major depressive disorder (MDD) is a mental health condition. MDD often makes you feel sad, hopeless, or helpless. MDD can also cause symptoms in your body. MDD can affect your:  Work.  School.  Relationships.  Other normal activities. MDD can range from mild to very bad. It may occur once (single episode MDD). It can also occur many times (recurrent MDD). The main symptoms of MDD often include:  Feeling sad, depressed, or irritable most of the time.  Loss of interest. MDD symptoms also include:  Sleeping too much or too little.  Eating too much or too little.  A change in your weight.  Feeling tired (fatigue) or having low energy.  Feeling worthless.  Feeling guilty.  Trouble making decisions.  Trouble thinking clearly.  Thoughts of suicide or harming others.  Feeling weak.  Feeling agitated.  Keeping yourself from being around other people (isolation). Follow these instructions at home: Activity   Do these things as told by your doctor:  Go back to your normal activities.  Exercise regularly.  Spend time outdoors. Alcohol   Talk with your doctor about how alcohol can affect your antidepressant medicines.  Do not drink alcohol. Or, limit how much alcohol you drink.  This means no more than 1 drink a day for nonpregnant women and 2 drinks a day for men. One drink equals one of these:  12 oz of beer.  5 oz of wine.  1 oz of hard liquor. General instructions   Take over-the-counter and prescription medicines only as told by your doctor.  Eat a healthy diet.  Get plenty of sleep.  Find activities that you enjoy. Make time to do them.  Think about joining a support group. Your doctor may be able to suggest a group for you.  Keep all follow-up visits as told by your doctor. This is important. Where to find more information:   [employer] on Mental Illness:  www.nami.org  U.S. National Institute of  Mental Health:  https://carter.com/  National Suicide Prevention Lifeline:  343-425-1106. This is free, 24-hour help. Contact a doctor if:  Your symptoms get worse.  You have new symptoms. Get help right away if:  You self-harm.  You see, hear, taste, smell, or feel things that are not present (hallucinate). If you ever feel like you may hurt yourself or others, or have thoughts about taking your own life, get help right away. You can go to your nearest emergency department or call:  Your local emergency services (911 in the [place]  A suicide crisis helpline, such as the [place]:  (339)845-9605. This is open 24 hours a day. This information is not intended to replace advice given to you by your health care provider. Make sure you discuss any questions you have with your health care provider. Document Released: 03/01/2015 Document Revised: 12/05/2015 Document Reviewed: 12/05/2015 Elsevier Interactive Patient Education  2017 [place] en los adultos (Cardiomyopathy, Adult) La miocardiopata es una enfermedad a largo plazo (crnica) del msculo cardaco. Esta enfermedad provoca engrosamiento, debilidad o rigidez en el msculo Festus resultado, el corazn debe trabajar ms para bombear la [place]. Con el tiempo, la miocardiopata puede provocar una insuficiencia cardaca. Existen varios tipos de miocardiopata:  Miocardiopata dilatada. Este tipo hace que los ventrculos se agranden y se debiliten.  Miocardiopata hipertrfica. Este tipo provoca el engrosamiento del msculo cardaco.  Miocardiopata restrictiva. Este tipo provoca la rigidez del msculo cardaco.  Miocardiopata isqumica.  Este tipo provoca el estrechamiento de las arterias, lo que deriva en el debilitamiento de las paredes del corazn.  Miocardiopata periparto. Este tipo ocurre durante o poco despus del Media planner. CAUSAS Esta afeccin puede ser  causada por lo siguiente:  Un gen que se transmite de Data processing manager (hereditario).  Una afeccin mdica que provoca daos en el corazn, como por ejemplo:  Diabetes.  Hipertensin arterial.  Infeccin viral del corazn.  Infarto de miocardio.  Cardiopata coronaria.  Alcoholismo.  Consumo de drogas ilegales o de algunos medicamentos recetados.  Embarazo.  Absorcin y almacenamiento de exceso de hierro en el cuerpo (hemocromatosis).  Enfermedades autoinmunitarias, enfermedades del tejido conjuntivo y [employer].  Tratamientos Social worker.  Acumulacin de protenas en los rganos (amiloidosis) o inflamacin de los rganos (sarcoidosis). Generalmente, no se conoce su causa. FACTORES DE RIESGO Es ms probable que esta afeccin se manifieste en las personas que:  Tiene antecedentes familiares de miocardiopata u otros problemas cardacos.  Tienen sobrepeso u obesidad.  Consume drogas.  Consumo excesivo de alcohol.  Tienen una afeccin mdica que provoca daos en el corazn. SNTOMAS Los sntomas de esta afeccin incluyen lo siguiente:  Falta de aire, especialmente al realizar actividad fsica.  Fatiga.  Ritmo cardaco irregular y soplos cardacos.  Mareos.  Sensacin de desvanecimiento.  Desmayos.  Dolor en el pecho.  Tos.  Hinchazn en la parte inferior de las piernas, los tobillos, los pies, el abdomen y las venas del cuello. Las personas con esta afeccin no suelen tener sntomas. DIAGNSTICO Esta afeccin se diagnostica en funcin de lo siguiente:  Los sntomas y antecedentes mdicos.  Un examen fsico.  Estudios. Los estudios pueden incluir lo siguiente:  [place]lis[place] de [place].  Estudios de diagnstico por imgenes del corazn, como por ejemplo:  Radiografas.  Un ecocardiograma.  Una resonancia magntica.  Un electrocardiograma (ECG). Este estudio registra la actividad elctrica del corazn.  Un estudio en el que se  utiliza un dispositivo porttil (monitor de [employer]) para registrar la actividad elctrica del corazn durante sus actividades diarias.  Leticia Clas. Este estudio comprueba la actividad del corazn mientras hace ejercicio fsico.  Cateterismo cardaco. Este procedimiento comprueba la presin arterial y el torrente sanguneo en el corazn.  Una angiografa. Este estudio implica la inyeccin de un tinte especial en las arterias antes de Editor, commissioning.  Una biopsia del tejido del corazn. Este estudio extrae una muestra del tejido del corazn para examinarlo. TRATAMIENTO El tratamiento de esta afeccin depende del tipo de [place] y la gravedad de los sntomas. Si no tiene sntomas, es posible que no necesite tratamiento. Si necesita tratamiento, este puede incluir lo siguiente:  Cambios de estilo de vida tales como:  Adoptar una dieta cardiosaludable que incluya muchas frutas, verduras y cereales integrales, y reducir el consumo de sal (sodio).  Mantener un peso saludable y bajar de [place], si fuera necesario.  Practicar actividad fsica con regularidad.  Si fuma, dejar de hacerlo.  Evitar el alcohol.  Medicamentos para:  Reducir la presin arterial.  Disminuir la frecuencia cardaca.  Mantener los latidos a un ritmo constante.  Eliminar el exceso de lquido del cuerpo.  Prevenir los cogulos sanguneos.  Equilibrar los [employer] (electrolitos) en el cuerpo y Radiographer, therapeutic el exceso de [place].  Reducir la inflamacin.  Fortalecer el ritmo cardaco.  Dwaine Gale para:  Reparar un defecto.  Extirpar tejido engrosado.  Destruir tejidos con actividad elctrica anormal en la zona (ablacin).  Implantar un dispositivo para tratar problemas graves del ritmo cardaco Building services engineer  cardioversor implantable o DCI) o un marcapasos.  Reemplazar el corazn (trasplante de corazn) si todos los dems tratamientos han fracasado (etapa terminal). Otros tratamientos pueden incluir una  terapia de resincronizacin cardaca (TRC) o un dispositivo de asistencia ventricular izquierda (DAVI). INSTRUCCIONES PARA EL CUIDADO EN EL HOGAR Estilo de vida  Consuma una dieta cardiosaludable. Consulte a su mdico o a un nutricionista matriculado para conocer opciones de alimentacin saludables.  Mantenga un peso saludable.  Mantngase fsicamente activo. Pida al mdico que le sugiera algunas actividades que sean apropiadas para usted.  No consuma ningn producto que contenga nicotina o tabaco, como cigarrillos y Psychologist, sport and exercise. Si necesita ayuda para dejar de fumar, consulte al mdico.  Limite el consumo de alcohol a no ms de 1 medida por da si es mujer y no est Music therapist, y no ms de 2 medidas si es hombre. Una medida equivale a 12onzas de cerveza, 5onzas de vino o 1onzas de bebidas alcohlicas de alta graduacin.  Trate de dormir como mnimo 7 horas todas las noches.  Busque formas saludables para controlar el estrs. Instrucciones generales  [employer] de venta libre y los recetados solamente como se lo haya indicado el mdico. Algunos medicamentos pueden ser peligrosos para el corazn.  Informe a todos sus mdicos, incluido el dentista, que tiene [employer]. Cuando vaya al dentista o se someta a Clementeen Hoof, pregntele al mdico si necesitar antibiticos antes del procedimiento dental o la ciruga.  Pregntele al mdico si debe utilizar un brazalete de identificacin. Esto puede ser importante si tiene un marcapasos o Biochemist, clinical.  Asegrese de aplicarse todas las vacunas recomendadas y [place] inyeccin anual contra la gripe.  Trabaje con el mdico para [employer] afecciones a largo plazo (crnicas) bajo control.  Concurra a todas las visitas de control como se lo haya indicado el mdico. Esto es importante, incluso si no tiene ningn sntoma. El mdico debe asegurarse de que su afeccin no empeore. PREVENCIN Esta afeccin no puede  prevenirse. Padres, hermanos e hijos de personas con esta afeccin pueden estar en riesgo de padecerla. Es recomendable realizarse estudios para Water quality scientist, ya que es mejor diagnosticar la miocardiopata en una etapa temprana. Las pruebas incluyen un electrocardiograma y Optometrist. Las personas que quieran formar una familia tambin deben reunirse con un asesor gentico para Physiological scientist los riesgos de tener un hijo con miocardiopata. SOLICITE ATENCIN MDICA SI:  Los sntomas empeoran.  Aparecen nuevos sntomas. SOLICITE ATENCIN MDICA DE INMEDIATO SI:  Tiene un dolor intenso en el pecho.  Le falta el aire.  Tose y elimina un material de color rosa, burbujeante.  Tiene sudoraciones repentinas.  Siente nuseas y vomita.  Se siente repentinamente mareado o se desmaya.  Siente que el corazn late Hansen rpidamente.  Siente que el corazn se saltea latidos. Estos sntomas pueden representar un problema grave que constituye Engineer, maintenance (IT). No espere hasta que los sntomas desaparezcan. Solicite atencin mdica de inmediato. Comunquese con el servicio de emergencias de su localidad (911 en los [place]). No conduzca por sus propios medios [employer]. Esta informacin no tiene Marine scientist el consejo del mdico. Asegrese de hacerle al mdico cualquier pregunta que tenga. Document Released: 03/20/2005 Document Revised: 04/10/2014 Document Reviewed: 09/20/2015 Elsevier Interactive Patient Education  2017 [employer].

## 2016-08-22 ENCOUNTER — Telehealth: Payer: Self-pay | Admitting: Licensed Clinical Social Worker

## 2016-08-22 NOTE — Telephone Encounter (Signed)
CSW referred by Zack from paramedicine today to reach out to patient for support call. Zack stated patient appeared depressed yesterday during his home visit and requested CSW contact patient. CSW left message for return call on both cell and home numbers. Raquel Sarna, [place], [place]

## 2016-08-23 ENCOUNTER — Encounter: Payer: Self-pay | Admitting: Internal Medicine

## 2016-08-23 ENCOUNTER — Ambulatory Visit (INDEPENDENT_AMBULATORY_CARE_PROVIDER_SITE_OTHER): Payer: BLUE CROSS/BLUE SHIELD | Admitting: Internal Medicine

## 2016-08-23 VITALS — BP 106/64 | HR 64 | Ht 63.0 in | Wt 129.0 lb

## 2016-08-23 DIAGNOSIS — I5022 Chronic systolic (congestive) heart failure: Secondary | ICD-10-CM | POA: Diagnosis not present

## 2016-08-23 DIAGNOSIS — Z01812 Encounter for preprocedural laboratory examination: Secondary | ICD-10-CM

## 2016-08-23 NOTE — Progress Notes (Signed)
HPI The patient is a 41 yo woman referred by Dr. Aundra Dubin for consideration of ICD insertion. She was diagnosed with chronic systolic heart failure several months ago, and has been on guideline directed medical therapy. Repeat echo has demonstrated no improvement in her LV function. She has a narrow QRS. The patient has not had syncope.She feels some fatigue and weakness. Allergies  Allergen Reactions  . Tramadol Itching and Other (See Comments)    Red spots all over body and itching  . Leflunomide Rash     Current Outpatient Prescriptions  Medication Sig Dispense Refill  . albuterol (PROVENTIL HFA;VENTOLIN HFA) 108 (90 Base) MCG/ACT inhaler Inhale 2 puffs into the lungs every 6 (six) hours as needed for wheezing or shortness of breath. 1 Inhaler 2  . aspirin EC 81 MG tablet Take 1 tablet (81 mg total) by mouth daily. 90 tablet 3  . benzonatate (TESSALON PERLES) 100 MG capsule Take 1 capsule (100 mg total) by mouth 3 (three) times daily as needed for cough. 90 capsule 3  . carvedilol (COREG) 3.125 MG tablet Take 1 tablet (3.125 mg total) by mouth 2 (two) times daily. 180 tablet 3  . digoxin (LANOXIN) 0.125 MG tablet Take 1 tablet (0.125 mg total) by mouth daily. 90 tablet 3  . furosemide (LASIX) 20 MG tablet Take 2 tablets (40 mg total) by mouth daily. 180 tablet 3  . ivabradine (CORLANOR) 5 MG TABS tablet Take 2.5 mg by mouth 2 (two) times daily with a meal.    . losartan (COZAAR) 25 MG tablet Take 0.5 tablets (12.5 mg total) by mouth at bedtime. 15 tablet 5  . PARoxetine (PAXIL) 30 MG tablet Take 1 tablet (30 mg total) by mouth daily. 30 tablet 3  . potassium chloride 20 MEQ TBCR Take 20 mEq by mouth daily. 90 tablet 3  . riboflavin (VITAMIN B-2) 100 MG TABS tablet Take 200 mg by mouth daily.    . traZODone (DESYREL) 100 MG tablet Take 200 mg by mouth at bedtime.     . triamcinolone cream (KENALOG) 0.1 % Apply 1 application topically as needed.  3   No current  facility-administered medications for this visit.      Past Medical History:  Diagnosis Date  . Acute systolic CHF (congestive heart failure) ([place]) 05/22/2016  . Blood transfusion without reported diagnosis   . 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   Past Surgical History:  Procedure Laterality Date  . COLONOSCOPY WITH PROPOFOL N/A 07/23/2015   Procedure: COLONOSCOPY WITH PROPOFOL;  Surgeon: Ronald Lobo, MD;  Location: [facility];  Service: Endoscopy;  Laterality: N/A;  . ESOPHAGOGASTRODUODENOSCOPY (EGD) WITH PROPOFOL N/A 07/23/2015   Procedure: ESOPHAGOGASTRODUODENOSCOPY (EGD) WITH PROPOFOL;  Surgeon: Ronald Lobo, MD;  Location: [facility];  Service: Endoscopy;  Laterality: N/A;  . ESOPHAGOGASTRODUODENOSCOPY (EGD) WITH PROPOFOL N/A 11/18/2015   Procedure: ESOPHAGOGASTRODUODENOSCOPY (EGD) WITH PROPOFOL;  Surgeon: Ronald Lobo, MD;  Location: [facility];  Service: Endoscopy;  Laterality: N/A;  . RIGHT/LEFT HEART CATH AND CORONARY ANGIOGRAPHY N/A 05/22/2016   Procedure: Right/Left Heart Cath and Coronary Angiography;  Surgeon: Peter M Martinique, MD;  Location: [facility];  Service: Cardiovascular;  Laterality: N/A;     Family History  Problem Relation Age of Onset  . Cancer Paternal Grandfather   . Mental illness Neg Hx      Social History   Social History  .  Marital status: Married    Spouse name: N/A  . Number of children: 1  . Years of education: N/A   Occupational History  . unemployeed    Social History Main Topics  . Smoking status: Former Research scientist (life sciences)  . Smokeless tobacco: Never Used  . Alcohol use No  . Drug use: No  . Sexual activity: No   Other Topics Concern  . Not on file   Social History Narrative   ** Merged History Encounter **         BP 106/64   Pulse 64   Ht 5\' 3"  (1.6 m)   Wt 129 lb (58.5 kg)   SpO2 98%   BMI 22.85 kg/m   Physical Exam:  Well appearing 41 yo  woman, NAD HEENT: Unremarkable Neck:  No JVD, no thyromegally Lymphatics:  No adenopathy Back:  No CVA tenderness Lungs:  Clear with no wheezes, rales, or rhonchi HEART:  Regular rate rhythm, no murmurs, no rubs, no clicks Abd:  soft, positive bowel sounds, no organomegally, no rebound, no guarding Ext:  2 plus pulses, no edema, no cyanosis, no clubbing Skin:  No rashes no nodules Neuro:  CN II through XII intact, motor grossly intact  EKG - reviewed - NSR with a narrow QRS  Assess/Plan: 1. Chronic systolic heart failure - she is on maximal medical therapy and she has class 2 symptoms. I have recommended that the patient undergo ICD implantation. I have reviewed the treatment option and ICD insertion. We discussed the treatment options with the patient. Specifically S-ICD vs standard ICD were reviewed. She is quite young and I think S-ICD would be her best treatment option. I have discussed the risks/benefits/goals/expectations of the procedure and she wishes to proceed. I spent over 45 minutes with the patient and her s.o who is her interpreter  Mikle Bosworth.D.

## 2016-08-23 NOTE — Patient Instructions (Addendum)
Medication Instructions:  Your physician recommends that you continue on your current medications as directed. Please refer to the Current Medication list given to you today.   Labwork: BMET / CBC - lab appointment 09/22/16 [place] office   Testing/Procedures:  Your physician has recommended that you have a defibrillator inserted. An implantable cardioverter defibrillator (ICD) is a small device that is placed in your chest or, in rare cases, your abdomen. This device uses electrical pulses or shocks to help control life-threatening, irregular heartbeats that could lead the heart to suddenly stop beating (sudden cardiac arrest). Leads are attached to the ICD that goes into your heart. This is done in the hospital and usually requires an overnight stay. Please see the instruction sheet given to you today for more information.     Follow-Up: 10-14 days after the procedure for a wound check in the [facility] and 91 days with Dr. Lovena Le.     Any Other Special Instructions Will Be Listed Below (If Applicable).  Please report to the [facility] on 09/27/16  at 6:30 PM   Nothing to eat or drink after midnight the night prior to the procedure  Do not take any medication the morning of the procedure  Plan a 1 night stay    If you need a refill on your cardiac medications before your next appointment, please call your pharmacy.

## 2016-09-01 ENCOUNTER — Other Ambulatory Visit (HOSPITAL_COMMUNITY): Payer: Self-pay

## 2016-09-01 NOTE — Progress Notes (Signed)
Paramedicine Encounter    Patient ID: Kathy Rosales, female    DOB: 05-Jan-1976, 41 y.o.   MRN: [id number]   Patient Care Team: Maren Reamer, MD as PCP - General (Internal Medicine) Brunetta Genera, MD as Consulting Physician (Hematology and Oncology) Martinique, Betty G, MD (Family Medicine)  Patient Active Problem List   Diagnosis Date Noted  . Hypokalemia 08/03/2016  . Chronic systolic heart failure ([place]) 05/22/2016  . Mitral regurgitation 05/22/2016  . Near syncope 05/22/2016  . Delirium due to multiple etiologies, persistent, mixed level of activity 12/13/2015  . MDD (major depressive disorder), single episode, moderate ([place]) 12/13/2015  . Opioid use disorder, mild, abuse 12/13/2015  . UTI (urinary tract infection) 12/13/2015  . Fatigue 12/01/2015  . Orthostatic hypotension 12/01/2015  . Helicobacter positive gastritis 08/06/2015  . Gastric ulcer 08/06/2015  . Iron deficiency anemia due to chronic blood loss 08/06/2015  . Anemia 07/21/2015  . Iron deficiency anemia 10/22/2014  . B12 deficiency 10/22/2014  . Symptomatic anemia 08/28/2014  . Headache 08/28/2014  . Chest pain 08/28/2014  . 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  albuterol (PROVENTIL HFA;VENTOLIN HFA) 108 (90 Base) MCG/ACT inhaler, Inhale 2 puffs into the lungs every 6 (six) hours as needed for wheezing or shortness of breath., Disp: 1 Inhaler, Rfl: 2 .  aspirin EC 81 MG tablet, Take 1 tablet (81 mg total) by mouth daily., Disp: 90 tablet, Rfl: 3 .  carvedilol (COREG) 3.125 MG tablet, Take 1 tablet (3.125 mg total) by mouth 2 (two) times daily., Disp: 180 tablet, Rfl: 3 .  digoxin (LANOXIN) 0.125 MG tablet, Take 1 tablet (0.125 mg total) by mouth daily., Disp: 90 tablet, Rfl: 3 .  furosemide (LASIX) 20 MG tablet, Take 2 tablets (40 mg total) by mouth daily., Disp: 180 tablet, Rfl: 3 .  ivabradine (CORLANOR) 5 MG TABS tablet, Take 2.5 mg by mouth 2 (two)  times daily with a meal., Disp: , Rfl:  .  losartan (COZAAR) 25 MG tablet, Take 0.5 tablets (12.5 mg total) by mouth at bedtime., Disp: 15 tablet, Rfl: 5 .  PARoxetine (PAXIL) 30 MG tablet, Take 1 tablet (30 mg total) by mouth daily., Disp: 30 tablet, Rfl: 3 .  potassium chloride 20 MEQ TBCR, Take 20 mEq by mouth daily., Disp: 90 tablet, Rfl: 3 .  riboflavin (VITAMIN B-2) 100 MG TABS tablet, Take 200 mg by mouth daily., Disp: , Rfl:  .  traZODone (DESYREL) 100 MG tablet, Take 200 mg by mouth at bedtime. , Disp: , Rfl:  .  benzonatate (TESSALON PERLES) 100 MG capsule, Take 1 capsule (100 mg total) by mouth 3 (three) times daily as needed for cough. (Patient not taking: Reported on 09/01/2016), Disp: 90 capsule, Rfl: 3 .  triamcinolone cream (KENALOG) 0.1 %, Apply 1 application topically as needed., Disp: , Rfl: 3 Allergies  Allergen Reactions  . Tramadol Itching and Other (See Comments)    Red spots all over body and itching  . Leflunomide Rash     Social History   Social History  . Marital status: Married    Spouse name: N/A  . Number of children: 1  . Years of education: N/A   Occupational History  . unemployeed    Social History Main Topics  . Smoking status: Former Research scientist (life sciences)  . Smokeless tobacco: Never Used  . Alcohol use No  . Drug use: No  . Sexual activity: No   Other Topics Concern  .  Not on file   Social History Narrative   ** Merged History Encounter **        Physical Exam  Constitutional: She is oriented to person, place, and time.  Neck: Normal range of motion.  Cardiovascular: Normal rate and regular rhythm.   Pulmonary/Chest: Effort normal and breath sounds normal. No respiratory distress.  Abdominal: Soft. She exhibits no distension. There is no tenderness. There is no rebound.  Musculoskeletal: Normal range of motion. She exhibits no edema.  Neurological: She is alert and oriented to person, place, and time.  Skin: Skin is warm and dry.        SAFE -  07/14/16 1700      Situation   Admitting diagnosis CFH   Heart failure history Exisiting   Comorbidities Anemia;Atrial fibillation;CKD/renal insufficiency   Readmitted within 30 days No   Hospital admission within past 12 months Yes   number of hospital admissions 5   number of ED visits 4   Target Weight 115 lbs     Assessment   Lives alone No   Primary support person Sister   Mode of transportation personal car   Other services involved None   Home equipement Scale     Weight   Weighs self daily Yes   Scale provided No   Records on weight chart Yes     Resources   Has "Living better w/heart failure" book No   Has HF Zone tool No   Able to identify yellow zone signs/when to call MD No   Records zone daily No     Medications   Uses a pill box No   Difficulty obtaining medications Yes   Barriers to medication Getting a ride   Clinical biochemist obtains medications from pharmacy No   Mail order medications No   Missed one or more doses of medications per week No     Nutrition   Patient receives meals on wheels No   Patient follows low sodium diet Yes   Has foods at home that meet the current recommended diet Yes   Patient follows low sugar/card diet Yes   Nutritional concerns/issues None     Activity Level   ADL's/Mobility Independent   How many feet can patient ambulate 20 ft   Typical activity level Active  inactive   Barriers Health    Actvity tolerance: NYHA Class 3     Urine   Difficulty urinating No   Changes in urine None     Time spent with patient   Time spent with patient  60 Minutes        Future Appointments Date Time Provider [place]  09/18/2016 3:45 PM Ladell Pier, MD [facility] None  09/22/2016 9:45 AM [facility]  09/25/2016 3:20 PM [facility] None  09/28/2016 4:20 PM Crenshaw, Denice Bors, MD [facility]   BP 100/70 (BP Location: Left Arm, Patient Position: Sitting, Cuff  Size: Normal)   Pulse 80   Resp 16   Wt 128 lb (58.1 kg)   SpO2 96%   BMI 22.67 kg/m  Weight yesterday- 129 lbs Last visit weight- 127 lbs  Evella was seen at home today and was having significant anxiety regarding her upcoming defibrillator implantation. She said she did not get the questions answered that she was hoping to and was thinking of not getting the device at all. I educated on the procedure and why she needs this device. After this she asked if she  could stop taking her medications after the defibrillator was placed and I told her that was not the purpose of the defibrillator. A significant amount of time was spent further explaining the device and educating her on medications. Her sister arrived at about 18:15 and began translating and asking more questions about the procedure and I expressed how strongly I advised her to take the advice of the cardiology team. She then began to express that she was depressed and that she did not know how to handle her stress. I advised her to seek the help of a professional counselor and continue taking her medications.   Jacquiline Doe, EMT 09/01/16  ACTION: Home visit completed Next visit planned for 1 week

## 2016-09-11 ENCOUNTER — Other Ambulatory Visit (HOSPITAL_COMMUNITY): Payer: Self-pay

## 2016-09-11 NOTE — Progress Notes (Signed)
Paramedicine Encounter    Patient ID: Kathy Rosales, female    DOB: [birth year], 41 y.o.   MRN: [id number]   Patient Care Team: Kathy Rosales as PCP - General (Internal Medicine) Kathy Rosales as Consulting Physician (Hematology and Oncology) Kathy Rosales (Family Medicine)  Patient Active Problem List   Diagnosis Date Noted  . Hypokalemia 08/03/2016  . Chronic systolic heart failure ([place]) 05/22/2016  . Mitral regurgitation 05/22/2016  . Near syncope 05/22/2016  . Delirium due to multiple etiologies, persistent, mixed level of activity 12/13/2015  . MDD (major depressive disorder), single episode, moderate ([place]) 12/13/2015  . Opioid use disorder, mild, abuse 12/13/2015  . UTI (urinary tract infection) 12/13/2015  . Fatigue 12/01/2015  . Orthostatic hypotension 12/01/2015  . Helicobacter positive gastritis 08/06/2015  . Gastric ulcer 08/06/2015  . Iron deficiency anemia due to chronic blood loss 08/06/2015  . Anemia 07/21/2015  . Iron deficiency anemia 10/22/2014  . B12 deficiency 10/22/2014  . Symptomatic anemia 08/28/2014  . Headache 08/28/2014  . Chest pain 08/28/2014  . 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  albuterol (PROVENTIL HFA;VENTOLIN HFA) 108 (90 Base) MCG/ACT inhaler, Inhale 2 puffs into the lungs every 6 (six) hours as needed for wheezing or shortness of breath., Disp: 1 Inhaler, Rfl: 2 .  aspirin EC 81 MG tablet, Take 1 tablet (81 mg total) by mouth daily., Disp: 90 tablet, Rfl: 3 .  carvedilol (COREG) 3.125 MG tablet, Take 1 tablet (3.125 mg total) by mouth 2 (two) times daily., Disp: 180 tablet, Rfl: 3 .  digoxin (LANOXIN) 0.125 MG tablet, Take 1 tablet (0.125 mg total) by mouth daily., Disp: 90 tablet, Rfl: 3 .  furosemide (LASIX) 20 MG tablet, Take 2 tablets (40 mg total) by mouth daily., Disp: 180 tablet, Rfl: 3 .  ivabradine (CORLANOR) 5 MG TABS tablet, Take 2.5 mg by mouth 2 (two)  times daily with a meal., Disp: , Rfl:  .  losartan (COZAAR) 25 MG tablet, Take 0.5 tablets (12.5 mg total) by mouth at bedtime., Disp: 15 tablet, Rfl: 5 .  PARoxetine (PAXIL) 30 MG tablet, Take 1 tablet (30 mg total) by mouth daily., Disp: 30 tablet, Rfl: 3 .  potassium chloride 20 MEQ TBCR, Take 20 mEq by mouth daily., Disp: 90 tablet, Rfl: 3 .  riboflavin (VITAMIN B-2) 100 MG TABS tablet, Take 200 mg by mouth daily., Disp: , Rfl:  .  traZODone (DESYREL) 100 MG tablet, Take 200 mg by mouth at bedtime. , Disp: , Rfl:  .  benzonatate (TESSALON PERLES) 100 MG capsule, Take 1 capsule (100 mg total) by mouth 3 (three) times daily as needed for cough. (Patient not taking: Reported on 09/01/2016), Disp: 90 capsule, Rfl: 3 .  triamcinolone cream (KENALOG) 0.1 %, Apply 1 application topically as needed., Disp: , Rfl: 3 Allergies  Allergen Reactions  . Tramadol Itching and Other (See Comments)    Red spots all over body and itching  . Leflunomide Rash     Social History   Social History  . Marital status: Married    Spouse name: N/A  . Number of children: 1  . Years of education: N/A   Occupational History  . unemployeed    Social History Main Topics  . Smoking status: Former Research scientist (life sciences)  . Smokeless tobacco: Never Used  . Alcohol use No  . Drug use: No  . Sexual activity: No   Other Topics Concern  .  Not on file   Social History Narrative   ** Merged History Encounter **        Physical Exam  Constitutional: She is oriented to person, place, and time.  Neck: Normal range of motion.  Cardiovascular: Normal rate and regular rhythm.   Pulmonary/Chest: Effort normal and breath sounds normal. No respiratory distress. She has no wheezes. She has no rales.  Abdominal: Soft. She exhibits no distension. There is no tenderness.  Musculoskeletal: Normal range of motion. She exhibits no edema.  Neurological: She is alert and oriented to person, place, and time.  Skin: Skin is warm and dry.   Psychiatric: She has a normal mood and affect.        SAFE - 07/14/16 1700      Situation   Admitting diagnosis CFH   Heart failure history Exisiting   Comorbidities Anemia;Atrial fibillation;CKD/renal insufficiency   Readmitted within 30 days No   Hospital admission within past 12 months Yes   number of hospital admissions 5   number of ED visits 4   Target Weight 115 lbs     Assessment   Lives alone No   Primary support person Kathy Rosales   Mode of transportation personal car   Other services involved None   Home equipement Scale     Weight   Weighs self daily Yes   Scale provided No   Records on weight chart Yes     Resources   Has "Living better w/heart failure" book No   Has HF Zone tool No   Able to identify yellow zone signs/when to call Rosales No   Records zone daily No     Medications   Uses a pill box No   Difficulty obtaining medications Yes   Barriers to medication Getting a ride   Clinical biochemist obtains medications from pharmacy No   Mail order medications No   Missed one or more doses of medications per week No     Nutrition   Patient receives meals on wheels No   Patient follows low sodium diet Yes   Has foods at home that meet the current recommended diet Yes   Patient follows low sugar/card diet Yes   Nutritional concerns/issues None     Activity Level   ADL's/Mobility Independent   How many feet can patient ambulate 20 ft   Typical activity level Active  inactive   Barriers Health    Actvity tolerance: NYHA Class 3     Urine   Difficulty urinating No   Changes in urine None     Time spent with patient   Time spent with patient  60 Minutes        Future Appointments Date Time Provider [place]  09/18/2016 3:45 PM Kathy Rosales [facility] None  09/22/2016 9:45 AM [facility]  09/25/2016 3:20 PM [facility] None  09/28/2016 4:20 PM Kathy Rosales [facility]   BP  90/60 (BP Location: Left Arm, Patient Position: Sitting, Cuff Size: Normal)   Pulse 76   Resp 16   Wt 129 lb (58.5 kg)   SpO2 98%   BMI 22.85 kg/m  Weight yesterday- 128.9 lbs Last visit weight- 128 lbs  Kathy Rosales was seen at home today and reports feeling tired but otherwise fine. She did have a period of time last night where she had sharp substernal chest pain that was sharp in nature and non-radiating. She denied SOB, H/A or dizziness and stated  she is sleeping on fewer pillows this week than last (2 pillows  Vs 3 pillows last week). She had more questions regarding the surgery for a defibrillator which I spent a small amount of time explaining to her. He medications were verified and her pillbox was refilled.   Jacquiline Doe, EMT 09/11/16  ACTION: Home visit completed Next visit planned for 1 week

## 2016-09-18 ENCOUNTER — Other Ambulatory Visit (HOSPITAL_COMMUNITY): Payer: Self-pay

## 2016-09-18 ENCOUNTER — Ambulatory Visit: Payer: BLUE CROSS/BLUE SHIELD | Attending: Internal Medicine | Admitting: Internal Medicine

## 2016-09-18 ENCOUNTER — Encounter: Payer: Self-pay | Admitting: Internal Medicine

## 2016-09-18 VITALS — BP 132/87 | HR 68 | Temp 98.2°F | Resp 16 | Wt 130.0 lb

## 2016-09-18 DIAGNOSIS — E876 Hypokalemia: Secondary | ICD-10-CM | POA: Insufficient documentation

## 2016-09-18 DIAGNOSIS — K2970 Gastritis, unspecified, without bleeding: Secondary | ICD-10-CM | POA: Diagnosis not present

## 2016-09-18 DIAGNOSIS — I951 Orthostatic hypotension: Secondary | ICD-10-CM | POA: Diagnosis not present

## 2016-09-18 DIAGNOSIS — N390 Urinary tract infection, site not specified: Secondary | ICD-10-CM | POA: Insufficient documentation

## 2016-09-18 DIAGNOSIS — I428 Other cardiomyopathies: Secondary | ICD-10-CM | POA: Diagnosis not present

## 2016-09-18 DIAGNOSIS — I340 Nonrheumatic mitral (valve) insufficiency: Secondary | ICD-10-CM | POA: Insufficient documentation

## 2016-09-18 DIAGNOSIS — Z7982 Long term (current) use of aspirin: Secondary | ICD-10-CM | POA: Insufficient documentation

## 2016-09-18 DIAGNOSIS — K259 Gastric ulcer, unspecified as acute or chronic, without hemorrhage or perforation: Secondary | ICD-10-CM | POA: Insufficient documentation

## 2016-09-18 DIAGNOSIS — E538 Deficiency of other specified B group vitamins: Secondary | ICD-10-CM | POA: Insufficient documentation

## 2016-09-18 DIAGNOSIS — Z87891 Personal history of nicotine dependence: Secondary | ICD-10-CM | POA: Diagnosis not present

## 2016-09-18 DIAGNOSIS — F1110 Opioid abuse, uncomplicated: Secondary | ICD-10-CM | POA: Diagnosis not present

## 2016-09-18 DIAGNOSIS — I429 Cardiomyopathy, unspecified: Secondary | ICD-10-CM | POA: Insufficient documentation

## 2016-09-18 DIAGNOSIS — I5022 Chronic systolic (congestive) heart failure: Secondary | ICD-10-CM | POA: Diagnosis not present

## 2016-09-18 DIAGNOSIS — R55 Syncope and collapse: Secondary | ICD-10-CM | POA: Insufficient documentation

## 2016-09-18 DIAGNOSIS — D500 Iron deficiency anemia secondary to blood loss (chronic): Secondary | ICD-10-CM | POA: Diagnosis not present

## 2016-09-18 DIAGNOSIS — F32A Depression, unspecified: Secondary | ICD-10-CM

## 2016-09-18 DIAGNOSIS — F329 Major depressive disorder, single episode, unspecified: Secondary | ICD-10-CM | POA: Diagnosis not present

## 2016-09-18 DIAGNOSIS — F321 Major depressive disorder, single episode, moderate: Secondary | ICD-10-CM | POA: Diagnosis not present

## 2016-09-18 NOTE — Progress Notes (Signed)
Paramedicine Encounter    Patient ID: Kathy Rosales, female    DOB: Apr 27, 1975, 41 y.o.   MRN: [id number]   Patient Care Team: Ladell Pier, MD as PCP - General (Internal Medicine) Brunetta Genera, MD as Consulting Physician (Hematology and Oncology) Martinique, Betty G, MD (Family Medicine)  Patient Active Problem List   Diagnosis Date Noted  . Hypokalemia 08/03/2016  . Chronic systolic heart failure ([place]) 05/22/2016  . Mitral regurgitation 05/22/2016  . Near syncope 05/22/2016  . Delirium due to multiple etiologies, persistent, mixed level of activity 12/13/2015  . MDD (major depressive disorder), single episode, moderate ([place]) 12/13/2015  . Opioid use disorder, mild, abuse 12/13/2015  . UTI (urinary tract infection) 12/13/2015  . Fatigue 12/01/2015  . Orthostatic hypotension 12/01/2015  . Helicobacter positive gastritis 08/06/2015  . Gastric ulcer 08/06/2015  . Iron deficiency anemia due to chronic blood loss 08/06/2015  . Anemia 07/21/2015  . Iron deficiency anemia 10/22/2014  . B12 deficiency 10/22/2014  . Symptomatic anemia 08/28/2014  . Headache 08/28/2014  . Chest pain 08/28/2014  . 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  albuterol (PROVENTIL HFA;VENTOLIN HFA) 108 (90 Base) MCG/ACT inhaler, Inhale 2 puffs into the lungs every 6 (six) hours as needed for wheezing or shortness of breath., Disp: 1 Inhaler, Rfl: 2 .  aspirin EC 81 MG tablet, Take 1 tablet (81 mg total) by mouth daily., Disp: 90 tablet, Rfl: 3 .  carvedilol (COREG) 3.125 MG tablet, Take 1 tablet (3.125 mg total) by mouth 2 (two) times daily., Disp: 180 tablet, Rfl: 3 .  digoxin (LANOXIN) 0.125 MG tablet, Take 1 tablet (0.125 mg total) by mouth daily., Disp: 90 tablet, Rfl: 3 .  furosemide (LASIX) 20 MG tablet, Take 2 tablets (40 mg total) by mouth daily., Disp: 180 tablet, Rfl: 3 .  ivabradine (CORLANOR) 5 MG TABS tablet, Take 2.5 mg by mouth 2 (two)  times daily with a meal., Disp: , Rfl:  .  losartan (COZAAR) 25 MG tablet, Take 0.5 tablets (12.5 mg total) by mouth at bedtime., Disp: 15 tablet, Rfl: 5 .  PARoxetine (PAXIL) 30 MG tablet, Take 1 tablet (30 mg total) by mouth daily., Disp: 30 tablet, Rfl: 3 .  potassium chloride 20 MEQ TBCR, Take 20 mEq by mouth daily., Disp: 90 tablet, Rfl: 3 .  riboflavin (VITAMIN B-2) 100 MG TABS tablet, Take 200 mg by mouth daily., Disp: , Rfl:  .  traZODone (DESYREL) 100 MG tablet, Take 200 mg by mouth at bedtime. , Disp: , Rfl:  .  benzonatate (TESSALON PERLES) 100 MG capsule, Take 1 capsule (100 mg total) by mouth 3 (three) times daily as needed for cough. (Patient not taking: Reported on 09/01/2016), Disp: 90 capsule, Rfl: 3 .  triamcinolone cream (KENALOG) 0.1 %, Apply 1 application topically as needed., Disp: , Rfl: 3 Allergies  Allergen Reactions  . Tramadol Itching and Other (See Comments)    Red spots all over body and itching  . Leflunomide Rash     Social History   Social History  . Marital status: Married    Spouse name: N/A  . Number of children: 1  . Years of education: N/A   Occupational History  . unemployeed    Social History Main Topics  . Smoking status: Former Research scientist (life sciences)  . Smokeless tobacco: Never Used  . Alcohol use No  . Drug use: No  . Sexual activity: No   Other Topics Concern  .  Not on file   Social History Narrative   ** Merged History Encounter **        Physical Exam  Constitutional: She is oriented to person, place, and time. She appears well-developed.  Neck: Normal range of motion.  Cardiovascular: Normal rate and regular rhythm.   Pulmonary/Chest: Effort normal and breath sounds normal. No respiratory distress. She has no wheezes. She has no rales.  Abdominal: Soft. She exhibits no distension. There is no tenderness. There is no rebound.  Musculoskeletal: Normal range of motion. She exhibits no edema.  Neurological: She is alert and oriented to person,  place, and time.  Skin: Skin is warm.  Psychiatric: She has a normal mood and affect.        Future Appointments Date Time Provider [place]  09/22/2016 9:45 AM [facility]  09/25/2016 3:20 PM [facility] None  09/28/2016 4:20 PM Crenshaw, Denice Bors, MD [facility]   BP 116/82 (BP Location: Right Arm, Patient Position: Sitting, Cuff Size: Normal)   Pulse 88   Resp 16   Wt 131 lb (59.4 kg)   SpO2 98%   BMI 23.21 kg/m  Weight yesterday- 130 lbs Last visit weight- 129 lbs  Kathy Rosales was seen at home today and reports feeling well. She denied SOB and said she is only using one pillow to sleep. She is able to walk >150' without becoming SOB and not having trouble with ADL's. She is having trouble grasping the concept that her HF is not going to be cured but can be managed, which is a major point of discussion at each visit. She asked if another echo could be performed prior to having her AICD placed to see if her heart was good enough to not have the device placed but I explained that it was unlikely that it could be done or that her heart had improved enough since her last echo. Medications were verified and her pill box was refilled.  Kathy Rosales, EMT 09/18/16  ACTION: Home visit completed Next visit planned for 1 week

## 2016-09-18 NOTE — Patient Instructions (Signed)
Keep your follow up appointments with cardiology and psychiatry.  Continue your current medications.  Limit salt and fluids as instructed by cardiology.

## 2016-09-18 NOTE — Progress Notes (Signed)
Patient ID: Kathy Rosales, female    DOB: Mar 14, 1976  MRN: [id number]  CC: re-establish and Follow-up   Subjective: Kathy Rosales is a 41 y.o. female who presents for 1 mth f/u. Boyfriend, Tonye Royalty is with her Her concerns today include:  Depression: Some days she has good energy and other days none. -taking Paxil, which helps -has appt coming with psychiatrist Denies SI.  CHF/NIC M: compliant No swelling in legs. Mild SOB when she is doing house cleaning.  Sleeps on one pillow Drinking 5 bottles instead of 2 bottles -Followed by cardiology plan for ICD placement later this month Patient Active Problem List   Diagnosis Date Noted  . Hypokalemia 08/03/2016  . Chronic systolic heart failure ([place]) 05/22/2016  . Mitral regurgitation 05/22/2016  . Near syncope 05/22/2016  . Delirium due to multiple etiologies, persistent, mixed level of activity 12/13/2015  . MDD (major depressive disorder), single episode, moderate ([place]) 12/13/2015  . Opioid use disorder, mild, abuse 12/13/2015  . UTI (urinary tract infection) 12/13/2015  . Fatigue 12/01/2015  . Orthostatic hypotension 12/01/2015  . Helicobacter positive gastritis 08/06/2015  . Gastric ulcer 08/06/2015  . Iron deficiency anemia due to chronic blood loss 08/06/2015  . Anemia 07/21/2015  . Iron deficiency anemia 10/22/2014  . B12 deficiency 10/22/2014  . Symptomatic anemia 08/28/2014  . Headache 08/28/2014  . Chest pain 08/28/2014  . 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 albuterol (PROVENTIL HFA;VENTOLIN HFA) 108 (90 Base) MCG/ACT inhaler Inhale 2 puffs into the lungs every 6 (six) hours as needed for wheezing or shortness of breath. 1 Inhaler 2  . aspirin EC 81 MG tablet Take 1 tablet (81 mg total) by mouth daily. 90 tablet 3  . benzonatate (TESSALON PERLES) 100 MG capsule Take 1 capsule (100 mg total) by mouth 3  (three) times daily as needed for cough. (Patient not taking: Reported on 09/01/2016) 90 capsule 3  . carvedilol (COREG) 3.125 MG tablet Take 1 tablet (3.125 mg total) by mouth 2 (two) times daily. 180 tablet 3  . digoxin (LANOXIN) 0.125 MG tablet Take 1 tablet (0.125 mg total) by mouth daily. 90 tablet 3  . furosemide (LASIX) 20 MG tablet Take 2 tablets (40 mg total) by mouth daily. 180 tablet 3  . ivabradine (CORLANOR) 5 MG TABS tablet Take 2.5 mg by mouth 2 (two) times daily with a meal.    . losartan (COZAAR) 25 MG tablet Take 0.5 tablets (12.5 mg total) by mouth at bedtime. 15 tablet 5  . PARoxetine (PAXIL) 30 MG tablet Take 1 tablet (30 mg total) by mouth daily. 30 tablet 3  . potassium chloride 20 MEQ TBCR Take 20 mEq by mouth daily. 90 tablet 3  . riboflavin (VITAMIN B-2) 100 MG TABS tablet Take 200 mg by mouth daily.    . traZODone (DESYREL) 100 MG tablet Take 200 mg by mouth at bedtime.     . triamcinolone cream (KENALOG) 0.1 % Apply 1 application topically as needed.  3   No current facility-administered medications on file prior to visit.     Allergies  Allergen Reactions  . Tramadol Itching and Other (See Comments)    Red spots all over body and itching  . Leflunomide Rash    Social History   Social History  . Marital status: Married    Spouse name: N/A  . Number of children: 1  .  Years of education: N/A   Occupational History  . unemployeed    Social History Main Topics  . Smoking status: Former Research scientist (life sciences)  . Smokeless tobacco: Never Used  . Alcohol use No  . Drug use: No  . Sexual activity: No   Other Topics Concern  . Not on file   Social History Narrative   ** Merged History Encounter **        Family History  Problem Relation Age of Onset  . Cancer Paternal Grandfather   . Mental illness Neg Hx     Past Surgical History:  Procedure Laterality Date  . COLONOSCOPY WITH PROPOFOL N/A 07/23/2015   Procedure: COLONOSCOPY WITH PROPOFOL;  Surgeon: Ronald Lobo, MD;  Location: [facility];  Service: Endoscopy;  Laterality: N/A;  . ESOPHAGOGASTRODUODENOSCOPY (EGD) WITH PROPOFOL N/A 07/23/2015   Procedure: ESOPHAGOGASTRODUODENOSCOPY (EGD) WITH PROPOFOL;  Surgeon: Ronald Lobo, MD;  Location: [facility];  Service: Endoscopy;  Laterality: N/A;  . ESOPHAGOGASTRODUODENOSCOPY (EGD) WITH PROPOFOL N/A 11/18/2015   Procedure: ESOPHAGOGASTRODUODENOSCOPY (EGD) WITH PROPOFOL;  Surgeon: Ronald Lobo, MD;  Location: [facility];  Service: Endoscopy;  Laterality: N/A;  . RIGHT/LEFT HEART CATH AND CORONARY ANGIOGRAPHY N/A 05/22/2016   Procedure: Right/Left Heart Cath and Coronary Angiography;  Surgeon: Peter M Martinique, MD;  Location: [facility];  Service: Cardiovascular;  Laterality: N/A;    ROS: Review of Systems As above  PHYSICAL EXAM: BP 132/87   Pulse 68   Temp 98.2 F (36.8 C) (Oral)   Resp 16   Wt 130 lb (59 kg)   SpO2 97%   BMI 23.03 kg/m   Wt Readings from Last 3 Encounters:  09/18/16 130 lb (59 kg)  09/11/16 129 lb (58.5 kg)  09/01/16 128 lb (58.1 kg)   Physical Exam  General appearance - alert, well appearing, and in no distress Mental status - alert, oriented to person, place, and time, normal mood, behavior, speech, dress, motor activity, and thought processes Neck - supple, no significant adenopathy Chest - clear to auscultation, no wheezes, rales or rhonchi, symmetric air entry Heart - normal rate, regular rhythm, normal S1, S2, no murmurs, rubs, clicks or gallops Extremities - peripheral pulses normal, no pedal edema, no clubbing or cyanosis  Depression screen PHQ 2/9 09/18/2016  Decreased Interest 3  Down, Depressed, Hopeless 3  PHQ - 2 Score 6  Altered sleeping 1  Tired, decreased energy 3  Change in appetite 3  Feeling bad or failure about yourself  2  Trouble concentrating 3  Moving slowly or fidgety/restless 0  Suicidal thoughts 1  PHQ-9 Score 19   GAD 7 : Generalized Anxiety Score 09/18/2016 08/21/2016  07/11/2016 05/09/2016  Nervous, Anxious, on Edge 1 3 1 1   Control/stop worrying 1 3 (No Data) 0  Worry too much - different things 0 3 3 0  Trouble relaxing 1 3 3 3   Restless 1 0 3 0  Easily annoyed or irritable 3 3 1  -  Afraid - awful might happen 1 3 3  0  Total GAD 7 Score 8 18 - -   ASSESSMENT AND PLAN: 1. Chronic systolic heart failure ([place]) 2. NICM (nonischemic cardiomyopathy) ([place]) -Stable. Continue current medications. Keep follow-up appointment with cardiology.  3. Depression, unspecified depression type -Continue Paxil at current dose. Keep follow-up appointment with psychiatrist next month.  Patient was given the opportunity to ask questions.  Patient verbalized understanding of the plan and was able to repeat key elements of the plan.   No  orders of the defined types were placed in this encounter.    Requested Prescriptions    No prescriptions requested or ordered in this encounter    Return in about 3 months (around 12/19/2016).  Karle Plumber, MD, FACP

## 2016-09-22 ENCOUNTER — Other Ambulatory Visit: Payer: BLUE CROSS/BLUE SHIELD | Admitting: *Deleted

## 2016-09-22 DIAGNOSIS — I5022 Chronic systolic (congestive) heart failure: Secondary | ICD-10-CM

## 2016-09-22 DIAGNOSIS — Z01812 Encounter for preprocedural laboratory examination: Secondary | ICD-10-CM

## 2016-09-23 LAB — CBC WITH DIFFERENTIAL/PLATELET
Basophils Absolute: 0 10*3/uL (ref 0.0–0.2)
Basos: 0 %
EOS (ABSOLUTE): 0.1 10*3/uL (ref 0.0–0.4)
Eos: 3 %
Hematocrit: 39.1 % (ref 34.0–46.6)
Hemoglobin: 13.1 g/dL (ref 11.1–15.9)
Immature Grans (Abs): 0 10*3/uL (ref 0.0–0.1)
Immature Granulocytes: 0 %
Lymphocytes Absolute: 1 10*3/uL (ref 0.7–3.1)
Lymphs: 19 %
MCH: 29.7 pg (ref 26.6–33.0)
MCHC: 33.5 g/dL (ref 31.5–35.7)
MCV: 89 fL (ref 79–97)
Monocytes Absolute: 0.2 10*3/uL (ref 0.1–0.9)
Monocytes: 5 %
Neutrophils Absolute: 3.8 10*3/uL (ref 1.4–7.0)
Neutrophils: 73 %
Platelets: 227 10*3/uL (ref 150–379)
RBC: 4.41 x10E6/uL (ref 3.77–5.28)
RDW: 16.9 % — ABNORMAL HIGH (ref 12.3–15.4)
WBC: 5.2 10*3/uL (ref 3.4–10.8)

## 2016-09-23 LAB — BASIC METABOLIC PANEL
BUN/Creatinine Ratio: 21 (ref 9–23)
BUN: 15 mg/dL (ref 6–24)
CO2: 19 mmol/L — ABNORMAL LOW (ref 20–29)
Calcium: 8.3 mg/dL — ABNORMAL LOW (ref 8.7–10.2)
Chloride: 107 mmol/L — ABNORMAL HIGH (ref 96–106)
Creatinine, Ser: 0.71 mg/dL (ref 0.57–1.00)
GFR calc Af Amer: 123 mL/min/{1.73_m2} (ref >59)
GFR calc non Af Amer: 107 mL/min/{1.73_m2} (ref >59)
Glucose: 128 mg/dL — ABNORMAL HIGH (ref 65–99)
Potassium: 3.5 mmol/L (ref 3.5–5.2)
Sodium: 141 mmol/L (ref 134–144)

## 2016-09-25 ENCOUNTER — Encounter (HOSPITAL_COMMUNITY): Payer: Self-pay

## 2016-09-25 ENCOUNTER — Other Ambulatory Visit (HOSPITAL_COMMUNITY): Payer: Self-pay

## 2016-09-25 ENCOUNTER — Ambulatory Visit (HOSPITAL_COMMUNITY)
Admission: RE | Admit: 2016-09-25 | Discharge: 2016-09-25 | Disposition: A | Discharge status: Home / Self Care | Payer: BLUE CROSS/BLUE SHIELD | Source: Ambulatory Visit | Attending: Cardiology | Admitting: Cardiology

## 2016-09-25 VITALS — BP 108/60 | HR 70 | Wt 130.0 lb

## 2016-09-25 DIAGNOSIS — I428 Other cardiomyopathies: Secondary | ICD-10-CM | POA: Insufficient documentation

## 2016-09-25 DIAGNOSIS — Z7982 Long term (current) use of aspirin: Secondary | ICD-10-CM | POA: Diagnosis not present

## 2016-09-25 DIAGNOSIS — Z87891 Personal history of nicotine dependence: Secondary | ICD-10-CM | POA: Insufficient documentation

## 2016-09-25 DIAGNOSIS — Z79899 Other long term (current) drug therapy: Secondary | ICD-10-CM | POA: Diagnosis not present

## 2016-09-25 DIAGNOSIS — I081 Rheumatic disorders of both mitral and tricuspid valves: Secondary | ICD-10-CM | POA: Insufficient documentation

## 2016-09-25 DIAGNOSIS — I5022 Chronic systolic (congestive) heart failure: Secondary | ICD-10-CM | POA: Diagnosis not present

## 2016-09-25 MED ORDER — SPIRONOLACTONE 25 MG PO TABS: 25.0000 mg | ORAL_TABLET | Freq: Every day | ORAL | 3 refills | Status: DC

## 2016-09-25 MED ORDER — CARVEDILOL 6.25 MG PO TABS: 6.2500 mg | ORAL_TABLET | Freq: Two times a day (BID) | ORAL | 3 refills | Status: DC

## 2016-09-25 MED ORDER — SPIRONOLACTONE 25 MG PO TABS: 12.5000 mg | ORAL_TABLET | Freq: Every day | ORAL | 3 refills | Status: DC

## 2016-09-25 NOTE — Progress Notes (Signed)
Paramedicine Encounter   Patient ID: Kathy Rosales , female,   DOB: 12/10/1975,40 y.o.,  MRN: [id number]   Met patient in clinic today with provider.  Time spent with patient 40 min  Carvedilol increased to 6.25 mg BID and discontinue corlanor. Adding 12.5 mg spironolactone lab check in 10 days.  Jacquiline Doe, EMT-Paramedic 09/25/2016   ACTION: Next visit planned for 1 week

## 2016-09-25 NOTE — Progress Notes (Deleted)
HPI: FU CHF, CM and MR.Echocardiogram January 2018 showed ejection fraction 25-30%, moderate left ventricular enlargement, grade 3 diastolic dysfunction, moderate to severe mitral regurgitation, severe left atrial enlargement, moderate right ventricular enlargement and moderate tricuspid regurgitation. Cardiac catheterization February 2018 showed ejection fraction 25%, normal coronary arteries, mild pulmonary hypertension and elevated left ventricular end-diastolic pressure. Last echocardiogram April 2018 showed ejection fraction 93-81%, grade 2 diastolic dysfunction, trace aortic insufficiency, mild mitral regurgitation and moderate left atrial enlargement. Patient had ICD placed June 2018. Since last seen,   Current Outpatient Prescriptions  Medication Sig Dispense Refill  . albuterol (PROVENTIL HFA;VENTOLIN HFA) 108 (90 Base) MCG/ACT inhaler Inhale 2 puffs into the lungs every 6 (six) hours as needed for wheezing or shortness of breath. 1 Inhaler 2  . aspirin EC 81 MG tablet Take 1 tablet (81 mg total) by mouth daily. 90 tablet 3  . benzonatate (TESSALON PERLES) 100 MG capsule Take 1 capsule (100 mg total) by mouth 3 (three) times daily as needed for cough. (Patient not taking: Reported on 09/01/2016) 90 capsule 3  . carvedilol (COREG) 3.125 MG tablet Take 1 tablet (3.125 mg total) by mouth 2 (two) times daily. 180 tablet 3  . digoxin (LANOXIN) 0.125 MG tablet Take 1 tablet (0.125 mg total) by mouth daily. 90 tablet 3  . furosemide (LASIX) 20 MG tablet Take 2 tablets (40 mg total) by mouth daily. 180 tablet 3  . ivabradine (CORLANOR) 5 MG TABS tablet Take 2.5 mg by mouth 2 (two) times daily with a meal.    . losartan (COZAAR) 25 MG tablet Take 0.5 tablets (12.5 mg total) by mouth at bedtime. 15 tablet 5  . PARoxetine (PAXIL) 30 MG tablet Take 1 tablet (30 mg total) by mouth daily. 30 tablet 3  . potassium chloride 20 MEQ TBCR Take 20 mEq by mouth daily. 90 tablet 3  . riboflavin (VITAMIN  B-2) 100 MG TABS tablet Take 200 mg by mouth daily.    . traZODone (DESYREL) 100 MG tablet Take 200 mg by mouth at bedtime.     . triamcinolone cream (KENALOG) 0.1 % Apply 1 application topically as needed.  3   No current facility-administered medications for this visit.      Past Medical History:  Diagnosis Date  . Acute systolic CHF (congestive heart failure) ([place]) 05/22/2016  . Blood transfusion without reported diagnosis   . Still's disease [facility])     Past Surgical History:  Procedure Laterality Date  . COLONOSCOPY WITH PROPOFOL N/A 07/23/2015   Procedure: COLONOSCOPY WITH PROPOFOL;  Surgeon: Ronald Lobo, MD;  [facility]: [facility];  Service: Endoscopy;  Laterality: N/A;  . ESOPHAGOGASTRODUODENOSCOPY (EGD) WITH PROPOFOL N/A 07/23/2015   Procedure: ESOPHAGOGASTRODUODENOSCOPY (EGD) WITH PROPOFOL;  Surgeon: Ronald Lobo, MD;  [facility];  Service: Endoscopy;  Laterality: N/A;  . ESOPHAGOGASTRODUODENOSCOPY (EGD) WITH PROPOFOL N/A 11/18/2015   Procedure: ESOPHAGOGASTRODUODENOSCOPY (EGD) WITH PROPOFOL;  Surgeon: Ronald Lobo, MD;  [facility];  Service: Endoscopy;  Laterality: N/A;  . RIGHT/LEFT HEART CATH AND CORONARY ANGIOGRAPHY N/A 05/22/2016   Procedure: Right/Left Heart Cath and Coronary Angiography;  Surgeon: Peter M Martinique, MD;  Location: [facility];  Service: Cardiovascular;  Laterality: N/A;    Social History   Social History  . Marital status: Married    Spouse name: N/A  . Number of children: 1  . Years of education: N/A   Occupational History  . unemployeed    Social History Main Topics  .  Smoking status: Former Research scientist (life sciences)  . Smokeless tobacco: Never Used  . Alcohol use No  . Drug use: No  . Sexual activity: No   Other Topics Concern  . Not on file   Social History Narrative   ** Merged History Encounter **        Family History  Problem Relation Age of Onset  . Cancer Paternal Grandfather   . Mental illness Neg Hx      ROS: no fevers or chills, productive cough, hemoptysis, dysphasia, odynophagia, melena, hematochezia, dysuria, hematuria, rash, seizure activity, orthopnea, PND, pedal edema, claudication. Remaining systems are negative.  Physical Exam: Well-developed well-nourished in no acute distress.  Skin is warm and dry.  HEENT is normal.  Neck is supple.  Chest is clear to auscultation with normal expansion.  Cardiovascular exam is regular rate and rhythm.  Abdominal exam nontender or distended. No masses palpated. Extremities show no edema. neuro grossly intact  ECG- personally reviewed  A/P  1  Kirk Ruths, MD

## 2016-09-25 NOTE — Patient Instructions (Signed)
Detener a Teaching laboratory technician carvedilol a 6.25 mg dos veces al da  Comience Spironolactone 12.5 mg (1/2 tab.) Cada tarde  [employer], NO TOME SUS MEDICAMENTOS ANTES DE SU CITA  Seguimiento en 6 semanas

## 2016-09-26 NOTE — Progress Notes (Signed)
PCP: Dr. Janne Napoleon Primary Cardiologist: Dr. Stanford Breed  HF MD: Dr Aundra Dubin     Advanced Heart Failure Clinic Note   HPI:  [name] is a 41 year old female with a past medical history of NICM EF 25-30% (cath in 05/2016 with normal cors), moderate mitral regurgitation, and tricuspid regurgitation. No FH of coronary disease.   Diagnosed with CHF and  went to her PCP in January 2018 for dyspnea. Says she started getting SOB in the fall. Denies any type of viral illness. No family history of heart disease. Echo was performed and showed reduced EF 20-25% with mod-severe MR. at 25-30%. She was referred to Dr. Stanford Breed who then set her up for a left and right heart cath. Admitted over night on 2/19 after cath due to changes in LOC after cath, was hypotensive. Symptoms resolved, head CT negative. Optimization of her medications was prohibited by hypotension prior to discharge. Started on corlanor.   Had CPX 07/15/16. Results as below. Immediately after, pt sat in chair and then had a pseudoseizure. No tonic/clonic activity. Pt taken to ED with Neurology consulted. Pt recovered spontaneously recovered. CT angio chest and CT head  unremarkable.  Repeat echo was done in 4/18.  EF remains 20-25% with moderate diastolic dysfunction.   She presents for HF followup today.  Spanish interpreter is present.  She is short of breath if she "walks a lot."  Dyspnea with stairs.  Generally doing ok with ADLs.  Weight stable.  No orthopnea/PND.  No chest pain.  No lightheadedness.  No palpitations.  She is now on Corlanor, she feels worse right after taking Corlanor (fatigued, nauseated).    ECG (5/18, personally reviewed): NSR, normal QRS duration, nonspecific T wave flattening  Labs (6/18): K 3.5, creatinine 0.71, hgb 13.1  CPX 07/18/16 Pre-Exercise PFTs  FVC 2.79 (92%)    FEV1 2.38 (93%)     FEV1/FVC 85 (101%)     MVV 48 (49%) Exercise Time:  3:15  Speed (mph): 1.5   Grade (%): 0   RPE: 17 Reason  stopped: Patient ended test due to Dyspnea (8/10) Additional symptoms: lightheaded (9/10), chest pressure (9/10) Resting HR: 84 Peak HR: 95  (53% age predicted max HR) BP rest: 92/62 BP peak: 88/60 Peak VO2: 14.1 (44% predicted peak VO2) VE/VCO2 slope: 59.4 OUES: 0.73 Peak RER: 0.96 Ventilatory Threshold: Not achieved  Peak RR 77 Peak Ventilation: 28.8 VE/MVV: 60% PETCO2 at peak: 28 O2pulse: 8  80% predicted O2pulse)  RHC/LHC 2/19/018  RA 3 PCWP  20 CO/CI 4.43/2.88 LHC normal cors.   12/10/2015 TSH 2.7   ECHO 04/2016 EF 20-25%. Mod-Severe MR, Mod TR.  ECHO 4/18 EF 202-5%, moderate diastolic dysfunction, PASP 31 mmHg.   SH: Lives with Mom. No alcohol or drug abuse. Does not smoke cigarettes  FH: No history of CAD.   Review of systems complete and found to be negative unless listed in HPI.    SH:  Social History   Social History  . Marital status: Married    Spouse name: N/A  . Number of children: 1  . Years of education: N/A   Occupational History  . unemployeed    Social History Main Topics  . Smoking status: Former Research scientist (life sciences)  . Smokeless tobacco: Never Used  . Alcohol use No  . Drug use: No  . Sexual activity: No   Other Topics Concern  . Not on file   Social History Narrative   ** Merged History Encounter **  FH:  Family History  Problem Relation Age of Onset  . Cancer Paternal Grandfather   . Mental illness Neg Hx     Past Medical History:  Diagnosis Date  . Acute systolic CHF (congestive heart failure) ([place]) 05/22/2016  . Blood transfusion without reported diagnosis   . Still's disease [facility])     Current Outpatient Prescriptions  Medication Sig Dispense Refill  . albuterol (PROVENTIL HFA;VENTOLIN HFA) 108 (90 Base) MCG/ACT inhaler Inhale 2 puffs into the lungs every 6 (six) hours as needed for wheezing or shortness of breath. 1 Inhaler 2  . aspirin EC 81 MG tablet Take 1 tablet (81 mg total) by mouth daily. 90 tablet 3  .  Aspirin-Salicylamide-Caffeine (BC HEADACHE PO) Take 1 packet by mouth 4 (four) times daily as needed (headaches).    . carvedilol (COREG) 6.25 MG tablet Take 1 tablet (6.25 mg total) by mouth 2 (two) times daily. 60 tablet 3  . digoxin (LANOXIN) 0.125 MG tablet Take 1 tablet (0.125 mg total) by mouth daily. 90 tablet 3  . diphenhydrAMINE (BENADRYL) 25 MG tablet Take 50 mg by mouth 2 (two) times daily.    . furosemide (LASIX) 20 MG tablet Take 2 tablets (40 mg total) by mouth daily. 180 tablet 3  . losartan (COZAAR) 25 MG tablet Take 0.5 tablets (12.5 mg total) by mouth at bedtime. 15 tablet 5  . PARoxetine (PAXIL) 30 MG tablet Take 1 tablet (30 mg total) by mouth daily. 30 tablet 3  . potassium chloride 20 MEQ TBCR Take 20 mEq by mouth daily. 90 tablet 3  . riboflavin (VITAMIN B-2) 100 MG TABS tablet Take 200 mg by mouth daily.    . traZODone (DESYREL) 100 MG tablet Take 200 mg by mouth at bedtime.     Marland Kitchen spironolactone (ALDACTONE) 25 MG tablet Take 0.5 tablets (12.5 mg total) by mouth daily. 15 tablet 3   No current facility-administered medications for this encounter.     Vitals:   09/25/16 1547  BP: 108/60  Pulse: 70  SpO2: 98%  Weight: 130 lb (59 kg)   Wt Readings from Last 3 Encounters:  09/25/16 130 lb (59 kg)  09/18/16 131 lb (59.4 kg)  09/18/16 130 lb (59 kg)    PHYSICAL EXAM: Interpreter present  General: Well appearing. No resp difficulty. HEENT: normal Neck: supple. JVD 7. Carotids 2+ bilat; no bruits. No thyromegaly or nodule noted. Cor: PMI nondisplaced. RRR, no murmur. Lungs: CTAB, normal effort. Abdomen: soft, non-tender, distended, no HSM. No bruits or masses. +BS  Extremities: no cyanosis, clubbing, or rash. No edema   Neuro: alert & orientedx3, cranial nerves grossly intact. moves all 4 extremities w/o difficulty. Affect pleasant   ASSESSMENT & PLAN: 1. Chronic Systolic Heart Failure: Nonischemic cardiomyopathy, ?viral cardiomyopathy. LHC 05/2016 no coronary  disease. Echo 05/2016 EF 20-25% mod MR, repeat echo 07/21/16 LVEF 20-25%.  NYHA class II symptoms.  Feels worse right after taking Corlanor.  BP stable.  She is not volume overloaded on exam.  - Continue Lasix 40 mg daily.   - Stop Corlanor (seems to tolerate poorly) and increase Coreg to 6.25 mg bid.  - Continue losartan 12.5 mg at bedtime.   - Add spironolactone 12.5 daily (take in the evening).  BMET in 2 wks.  - Continue digoxin, check level today.   - CPX limited by submaximal effort and pseudoseizure at end of test, but showed severe HF limitation. Unclear how valid test results were.  - Narrow QRS, not  CRT candidate.  She has seen EP, plan for subQ ICD.  2. Mitral Regurgitation: Mild by most recent echo 07/21/16   Followup in 6 wks.   Loralie Champagne, MD  09/26/2016 2:31 PM

## 2016-09-27 ENCOUNTER — Ambulatory Visit (HOSPITAL_COMMUNITY): Payer: BLUE CROSS/BLUE SHIELD

## 2016-09-27 ENCOUNTER — Ambulatory Visit (HOSPITAL_COMMUNITY): Payer: BLUE CROSS/BLUE SHIELD | Admitting: Anesthesiology

## 2016-09-27 ENCOUNTER — Ambulatory Visit (HOSPITAL_COMMUNITY)
Admission: RE | Admit: 2016-09-27 | Discharge: 2016-09-29 | Disposition: A | Discharge status: Home / Self Care | Payer: BLUE CROSS/BLUE SHIELD | Source: Ambulatory Visit | Attending: Internal Medicine | Admitting: Internal Medicine

## 2016-09-27 ENCOUNTER — Encounter (HOSPITAL_COMMUNITY): Payer: Self-pay | Admitting: Certified Registered Nurse Anesthetist

## 2016-09-27 ENCOUNTER — Encounter (HOSPITAL_COMMUNITY)
Admission: RE | Disposition: A | Discharge status: Home / Self Care | Payer: Self-pay | Source: Ambulatory Visit | Attending: Internal Medicine

## 2016-09-27 DIAGNOSIS — Z79899 Other long term (current) drug therapy: Secondary | ICD-10-CM | POA: Diagnosis not present

## 2016-09-27 DIAGNOSIS — R51 Headache: Secondary | ICD-10-CM | POA: Diagnosis not present

## 2016-09-27 DIAGNOSIS — R4182 Altered mental status, unspecified: Secondary | ICD-10-CM | POA: Insufficient documentation

## 2016-09-27 DIAGNOSIS — R531 Weakness: Secondary | ICD-10-CM

## 2016-09-27 DIAGNOSIS — Z87891 Personal history of nicotine dependence: Secondary | ICD-10-CM | POA: Insufficient documentation

## 2016-09-27 DIAGNOSIS — G8918 Other acute postprocedural pain: Secondary | ICD-10-CM | POA: Diagnosis not present

## 2016-09-27 DIAGNOSIS — M0820 Juvenile rheumatoid arthritis with systemic onset, unspecified site: Secondary | ICD-10-CM | POA: Diagnosis not present

## 2016-09-27 DIAGNOSIS — Z809 Family history of malignant neoplasm, unspecified: Secondary | ICD-10-CM | POA: Insufficient documentation

## 2016-09-27 DIAGNOSIS — R1310 Dysphagia, unspecified: Secondary | ICD-10-CM | POA: Insufficient documentation

## 2016-09-27 DIAGNOSIS — Z888 Allergy status to other drugs, medicaments and biological substances status: Secondary | ICD-10-CM | POA: Diagnosis not present

## 2016-09-27 DIAGNOSIS — I340 Nonrheumatic mitral (valve) insufficiency: Secondary | ICD-10-CM | POA: Diagnosis not present

## 2016-09-27 DIAGNOSIS — I5022 Chronic systolic (congestive) heart failure: Secondary | ICD-10-CM | POA: Diagnosis not present

## 2016-09-27 DIAGNOSIS — I272 Pulmonary hypertension, unspecified: Secondary | ICD-10-CM | POA: Diagnosis not present

## 2016-09-27 DIAGNOSIS — R4189 Other symptoms and signs involving cognitive functions and awareness: Secondary | ICD-10-CM | POA: Diagnosis not present

## 2016-09-27 DIAGNOSIS — Z885 Allergy status to narcotic agent status: Secondary | ICD-10-CM | POA: Diagnosis not present

## 2016-09-27 DIAGNOSIS — I429 Cardiomyopathy, unspecified: Secondary | ICD-10-CM | POA: Insufficient documentation

## 2016-09-27 DIAGNOSIS — Z7982 Long term (current) use of aspirin: Secondary | ICD-10-CM | POA: Diagnosis not present

## 2016-09-27 DIAGNOSIS — I639 Cerebral infarction, unspecified: Secondary | ICD-10-CM

## 2016-09-27 HISTORY — PX: SUBQ ICD IMPLANT: EP1223

## 2016-09-27 LAB — GLUCOSE, CAPILLARY: Glucose-Capillary: 110 mg/dL — ABNORMAL HIGH (ref 65–99)

## 2016-09-27 LAB — COMPREHENSIVE METABOLIC PANEL
ALT: 17 U/L (ref 14–54)
AST: 25 U/L (ref 15–41)
Albumin: 3.8 g/dL (ref 3.5–5.0)
Alkaline Phosphatase: 59 U/L (ref 38–126)
Anion gap: 7 (ref 5–15)
BUN: 6 mg/dL (ref 6–20)
CO2: 22 mmol/L (ref 22–32)
Calcium: 8.6 mg/dL — ABNORMAL LOW (ref 8.9–10.3)
Chloride: 109 mmol/L (ref 101–111)
Creatinine, Ser: 0.6 mg/dL (ref 0.44–1.00)
GFR calc Af Amer: >60 mL/min (ref >60)
GFR calc non Af Amer: >60 mL/min (ref >60)
Glucose, Bld: 120 mg/dL — ABNORMAL HIGH (ref 65–99)
Potassium: 4.1 mmol/L (ref 3.5–5.1)
Sodium: 138 mmol/L (ref 135–145)
Total Bilirubin: 0.6 mg/dL (ref 0.3–1.2)
Total Protein: 6.2 g/dL — ABNORMAL LOW (ref 6.5–8.1)

## 2016-09-27 LAB — BLOOD GAS, ARTERIAL
Acid-base deficit: 0.4 mmol/L (ref 0.0–2.0)
Bicarbonate: 23.8 mmol/L (ref 20.0–28.0)
Delivery systems: POSITIVE
Drawn by: 33147
Expiratory PAP: 6
FIO2: 40
Inspiratory PAP: 14
O2 Saturation: 99 %
Patient temperature: 97.7
pCO2 arterial: 38.5 mmHg (ref 32.0–48.0)
pH, Arterial: 7.406 (ref 7.350–7.450)
pO2, Arterial: 198 mmHg — ABNORMAL HIGH (ref 83.0–108.0)

## 2016-09-27 LAB — PREGNANCY, URINE: Preg Test, Ur: NEGATIVE

## 2016-09-27 LAB — SURGICAL PCR SCREEN
MRSA, PCR: NEGATIVE
Staphylococcus aureus: NEGATIVE

## 2016-09-27 SURGERY — SUBQ ICD IMPLANT
Anesthesia: General

## 2016-09-27 MED ORDER — HEPARIN (PORCINE) IN NACL 2-0.9 UNIT/ML-% IJ SOLN
INTRAMUSCULAR | Status: AC
  Filled 2016-09-27: qty 500

## 2016-09-27 MED ORDER — SODIUM CHLORIDE 0.9 % IR SOLN
Status: AC
  Filled 2016-09-27: qty 2

## 2016-09-27 MED ORDER — CHLORHEXIDINE GLUCONATE 4 % EX LIQD
60.0000 mL | Freq: Once | CUTANEOUS | Status: DC
  Filled 2016-09-27: qty 60

## 2016-09-27 MED ORDER — PHENYLEPHRINE HCL 10 MG/ML IJ SOLN
INTRAMUSCULAR | Status: DC | PRN
  Administered 2016-09-27 (×3): 40 ug via INTRAVENOUS

## 2016-09-27 MED ORDER — ACETAMINOPHEN 325 MG PO TABS: 325.0000 mg | ORAL_TABLET | ORAL | Status: DC | PRN

## 2016-09-27 MED ORDER — ONDANSETRON HCL 4 MG/2ML IJ SOLN: 4.0000 mg | Freq: Four times a day (QID) | INTRAMUSCULAR | Status: DC | PRN

## 2016-09-27 MED ORDER — CARVEDILOL 3.125 MG PO TABS: 6.2500 mg | ORAL_TABLET | Freq: Two times a day (BID) | ORAL | Status: DC

## 2016-09-27 MED ORDER — LIDOCAINE HCL (PF) 1 % IJ SOLN
INTRAMUSCULAR | Status: AC
  Filled 2016-09-27: qty 90

## 2016-09-27 MED ORDER — DIPHENHYDRAMINE HCL 25 MG PO TABS
50.0000 mg | ORAL_TABLET | Freq: Two times a day (BID) | ORAL | Status: DC
  Filled 2016-09-27: qty 2

## 2016-09-27 MED ORDER — NALOXONE HCL 0.4 MG/ML IJ SOLN
INTRAMUSCULAR | Status: AC
  Filled 2016-09-27: qty 1

## 2016-09-27 MED ORDER — VANCOMYCIN HCL IN DEXTROSE 1-5 GM/200ML-% IV SOLN
1000.0000 mg | Freq: Three times a day (TID) | INTRAVENOUS | Status: AC
  Administered 2016-09-27: 1000 mg via INTRAVENOUS
  Filled 2016-09-27: qty 200

## 2016-09-27 MED ORDER — PROPOFOL 10 MG/ML IV BOLUS
INTRAVENOUS | Status: DC | PRN
  Administered 2016-09-27: 20 mg via INTRAVENOUS
  Administered 2016-09-27 (×3): 50 mg via INTRAVENOUS
  Administered 2016-09-27: 20 mg via INTRAVENOUS
  Administered 2016-09-27: 50 mg via INTRAVENOUS
  Administered 2016-09-27: 20 mg via INTRAVENOUS

## 2016-09-27 MED ORDER — SODIUM CHLORIDE 0.9 % IV SOLN
INTRAVENOUS | Status: DC
  Administered 2016-09-27: 07:00:00 via INTRAVENOUS

## 2016-09-27 MED ORDER — FLUMAZENIL 0.5 MG/5ML IV SOLN
0.2000 mg | Freq: Once | INTRAVENOUS | Status: AC
  Administered 2016-09-27: 0.2 mg via INTRAVENOUS

## 2016-09-27 MED ORDER — ACETAMINOPHEN 650 MG RE SUPP
650.0000 mg | RECTAL | Status: DC | PRN
  Administered 2016-09-27: 650 mg via RECTAL
  Filled 2016-09-27: qty 1

## 2016-09-27 MED ORDER — POTASSIUM CHLORIDE ER 20 MEQ PO TBCR: 20.0000 meq | EXTENDED_RELEASE_TABLET | Freq: Every day | ORAL | Status: DC

## 2016-09-27 MED ORDER — FLUMAZENIL 0.5 MG/5ML IV SOLN
INTRAVENOUS | Status: AC
  Filled 2016-09-27: qty 5

## 2016-09-27 MED ORDER — LOSARTAN POTASSIUM 25 MG PO TABS
12.5000 mg | ORAL_TABLET | Freq: Every day | ORAL | Status: DC
  Filled 2016-09-27: qty 0.5

## 2016-09-27 MED ORDER — MIDAZOLAM HCL 5 MG/5ML IJ SOLN
INTRAMUSCULAR | Status: DC | PRN
  Administered 2016-09-27: 1 mg via INTRAVENOUS

## 2016-09-27 MED ORDER — POTASSIUM CHLORIDE CRYS ER 20 MEQ PO TBCR: 20.0000 meq | EXTENDED_RELEASE_TABLET | Freq: Every day | ORAL | Status: DC

## 2016-09-27 MED ORDER — ALBUTEROL SULFATE HFA 108 (90 BASE) MCG/ACT IN AERS: 2.0000 | INHALATION_SPRAY | Freq: Four times a day (QID) | RESPIRATORY_TRACT | Status: DC | PRN

## 2016-09-27 MED ORDER — MUPIROCIN 2 % EX OINT
TOPICAL_OINTMENT | CUTANEOUS | Status: AC
  Administered 2016-09-27: 1
  Filled 2016-09-27: qty 22

## 2016-09-27 MED ORDER — SPIRONOLACTONE 12.5 MG HALF TABLET
12.5000 mg | ORAL_TABLET | Freq: Every day | ORAL | Status: DC
  Filled 2016-09-27: qty 1

## 2016-09-27 MED ORDER — ALBUTEROL SULFATE (2.5 MG/3ML) 0.083% IN NEBU: 2.5000 mg | INHALATION_SOLUTION | Freq: Four times a day (QID) | RESPIRATORY_TRACT | Status: DC | PRN

## 2016-09-27 MED ORDER — TRAZODONE HCL 100 MG PO TABS
200.0000 mg | ORAL_TABLET | Freq: Every day | ORAL | Status: DC
  Filled 2016-09-27: qty 2

## 2016-09-27 MED ORDER — ONDANSETRON HCL 4 MG/2ML IJ SOLN: 4.0000 mg | Freq: Once | INTRAMUSCULAR | Status: DC | PRN

## 2016-09-27 MED ORDER — SODIUM CHLORIDE 0.9 % IR SOLN
80.0000 mg | Status: AC
  Administered 2016-09-27: 80 mg
  Filled 2016-09-27: qty 2

## 2016-09-27 MED ORDER — FUROSEMIDE 40 MG PO TABS: 40.0000 mg | ORAL_TABLET | Freq: Every day | ORAL | Status: DC

## 2016-09-27 MED ORDER — HEPARIN (PORCINE) IN NACL 2-0.9 UNIT/ML-% IJ SOLN
INTRAMUSCULAR | Status: AC | PRN
  Administered 2016-09-27: 500 mL

## 2016-09-27 MED ORDER — ASPIRIN EC 81 MG PO TBEC: 81.0000 mg | DELAYED_RELEASE_TABLET | Freq: Every day | ORAL | Status: DC

## 2016-09-27 MED ORDER — CEFAZOLIN SODIUM-DEXTROSE 2-4 GM/100ML-% IV SOLN
2.0000 g | INTRAVENOUS | Status: AC
  Administered 2016-09-27: 2 g via INTRAVENOUS

## 2016-09-27 MED ORDER — PAROXETINE HCL 30 MG PO TABS
30.0000 mg | ORAL_TABLET | Freq: Every day | ORAL | Status: DC
  Filled 2016-09-27: qty 1

## 2016-09-27 MED ORDER — KETOROLAC TROMETHAMINE 30 MG/ML IJ SOLN
30.0000 mg | Freq: Three times a day (TID) | INTRAMUSCULAR | Status: DC | PRN
  Administered 2016-09-27 – 2016-09-28 (×3): 30 mg via INTRAVENOUS
  Filled 2016-09-27 (×3): qty 1

## 2016-09-27 MED ORDER — FENTANYL CITRATE (PF) 100 MCG/2ML IJ SOLN
12.5000 ug | Freq: Once | INTRAMUSCULAR | Status: AC
  Administered 2016-09-27: 12.5 ug via INTRAVENOUS
  Filled 2016-09-27: qty 2

## 2016-09-27 MED ORDER — IOPAMIDOL (ISOVUE-370) INJECTION 76%
INTRAVENOUS | Status: AC
  Administered 2016-09-27: 50 mL
  Filled 2016-09-27: qty 50

## 2016-09-27 MED ORDER — BUPIVACAINE HCL (PF) 0.25 % IJ SOLN
INTRAMUSCULAR | Status: DC | PRN
  Administered 2016-09-27: 90 mL

## 2016-09-27 MED ORDER — FENTANYL CITRATE (PF) 100 MCG/2ML IJ SOLN: 25.0000 ug | INTRAMUSCULAR | Status: DC | PRN

## 2016-09-27 MED ORDER — PHENYLEPHRINE HCL 10 MG/ML IJ SOLN
INTRAMUSCULAR | Status: DC | PRN
  Administered 2016-09-27: 20 ug/min via INTRAVENOUS

## 2016-09-27 MED ORDER — FENTANYL CITRATE (PF) 100 MCG/2ML IJ SOLN
INTRAMUSCULAR | Status: DC | PRN
  Administered 2016-09-27 (×2): 25 ug via INTRAVENOUS

## 2016-09-27 MED ORDER — ONDANSETRON HCL 4 MG/2ML IJ SOLN
INTRAMUSCULAR | Status: DC | PRN
  Administered 2016-09-27: 4 mg via INTRAVENOUS

## 2016-09-27 MED ORDER — VITAMIN B-2 100 MG PO TABS: 200.0000 mg | ORAL_TABLET | Freq: Every day | ORAL | Status: DC

## 2016-09-27 MED ORDER — LIDOCAINE 2% (20 MG/ML) 5 ML SYRINGE
INTRAMUSCULAR | Status: DC | PRN
  Administered 2016-09-27: 60 mg via INTRAVENOUS

## 2016-09-27 MED ORDER — BUPIVACAINE HCL (PF) 0.25 % IJ SOLN
INTRAMUSCULAR | Status: AC
Start: 2016-09-27 — End: 2016-09-27
  Filled 2016-09-27: qty 90

## 2016-09-27 MED ORDER — CEFAZOLIN SODIUM-DEXTROSE 2-4 GM/100ML-% IV SOLN
INTRAVENOUS | Status: AC
  Filled 2016-09-27: qty 100

## 2016-09-27 MED ORDER — DEXAMETHASONE SODIUM PHOSPHATE 10 MG/ML IJ SOLN
INTRAMUSCULAR | Status: DC | PRN
  Administered 2016-09-27: 10 mg via INTRAVENOUS

## 2016-09-27 SURGICAL SUPPLY — 4 items
ICD SUBCU MRI EMBLEM A219 (ICD Generator) ×2 IMPLANT
LEAD SUBQU EMBLEM 3501 (Pacemaker) ×2 IMPLANT
PAD DEFIB LIFELINK (PAD) ×2 IMPLANT
TRAY PACEMAKER INSERTION (PACKS) ×2 IMPLANT

## 2016-09-27 NOTE — Progress Notes (Addendum)
Episodes of hyperventilating becoming more frequent and more pronounced. Dr. Lovena Le here. Ordering CT scan No response to [place]. Dr. Roanna Banning present.

## 2016-09-27 NOTE — Transfer of Care (Signed)
Immediate Anesthesia Transfer of Care Note  Patient: Kathy Rosales  Procedure(s) Performed: Procedure(s): SubQ ICD Implant (N/A)  Patient Location: Cath Lab  Anesthesia Type:General  Level of Consciousness: awake, alert  and oriented  Airway & Oxygen Therapy: Patient Spontanous Breathing and Patient connected to nasal cannula oxygen  Post-op Assessment: Report given to RN and Post -op Vital signs reviewed and stable  Post vital signs: Reviewed and stable  Last Vitals:  Vitals:   09/27/16 0610  BP: 132/88  Pulse: 80  Resp: 18  Temp: 36.9 C    Last Pain:  Vitals:   09/27/16 0610  TempSrc: Oral         Complications: No apparent anesthesia complications

## 2016-09-27 NOTE — Progress Notes (Signed)
Patient monitored in cath lab recovery for possible delayed emergence. Patient placed on BiPAP and arterial blood gas obtained. Results reviewed and flumazenil given. No evidence of hyercarbia, CO2 narcosis, residual anesthetic, or benzodiazepine overdose. Patient has gotten progressively less response since arrival in recovery. Neurology consulted for additional support, STAT CT of the head recommended and ordered. Dr. Lovena Le updated.

## 2016-09-27 NOTE — Anesthesia Preprocedure Evaluation (Addendum)
Anesthesia Evaluation  Patient identified by MRN, date of birth, ID band Patient awake  General Assessment Comment:Still's disease.  Reviewed: Allergy & Precautions, NPO status , Patient's Chart, lab work & pertinent test results  Airway Mallampati: III  TM Distance: >3 FB Neck ROM: Full    Dental no notable dental hx.    Pulmonary former smoker,    Pulmonary exam normal breath sounds clear to auscultation       Cardiovascular +CHF  Normal cardiovascular exam+ Valvular Problems/Murmurs MR  Rhythm:Regular Rate:Normal  ECG: NSR, rate 89  ECHO: - Left ventricle: The cavity size was moderately dilated. Wall thickness was normal. Systolic function was severely reduced. The estimated ejection fraction was in the range of 20% to 25%.   Diffuse hypokinesis. Features are consistent with a pseudonormal left ventricular filling pattern, with concomitant abnormal relaxation and increased filling pressure (grade 2 diastolic dysfunction). - Aortic valve: There was trivial regurgitation. - Mitral valve: There was mild regurgitation. - Left atrium: The atrium was moderately dilated. - Pulmonary arteries: PA peak pressure: 31 mm Hg (S).  CATH:There is severe left ventricular systolic dysfunction.  LV end diastolic pressure is mildly elevated.  The left ventricular ejection fraction is less than 25% by visual estimate. Hemodynamic findings consistent with mild pulmonary hypertension.   1. Normal coronary anatomy 2. Marked LV enlargement with severe global HK and EF 20-25% 3. Mild pulmonary HTN 4. Elevated LVEDP. Prominent V waves on PCWP tracing c/w severe MR and/or LV restriction 5. MR- difficult to quantify on angiography due to incomplete opacification of the LV with contrast.  Plan: optimize medical therapy.    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, arthritis, rash) hcg negative  Reproductive/Obstetrics negative OB ROS                            Anesthesia Physical  Anesthesia Plan  ASA: IV  Anesthesia Plan: General   Post-op Pain Management:    Induction: Intravenous  PONV Risk Score and Plan: 2 and Ondansetron, Dexamethasone and Propofol  Airway Management Planned: LMA  Additional Equipment:   Intra-op Plan:   Post-operative Plan:   Informed Consent: I have reviewed the patients History and Physical, chart, labs and discussed the procedure including the risks, benefits and alternatives for the proposed anesthesia with the patient or authorized representative who has indicated his/her understanding and acceptance.   Dental advisory given  Plan Discussed with: CRNA  Anesthesia Plan Comments:        Anesthesia Quick Evaluation

## 2016-09-27 NOTE — Progress Notes (Signed)
Dr. Roanna Banning paged and here to see patient. Patient having episodes of tachepnea followed by apnea then shallow respirations followed by regular respirations.  RT here to place on BIPAP. Not responsive.

## 2016-09-27 NOTE — Progress Notes (Signed)
Pt transported to 4N on  BIPAP with no complications.

## 2016-09-27 NOTE — Progress Notes (Addendum)
Rapid Response called. ABG drawn by RT. STAT PCXR ordered. Dr. Lovena Le informed of patient status. Unresponsive to painful stimuli.

## 2016-09-27 NOTE — Progress Notes (Signed)
To CT scan then to 4n17

## 2016-09-27 NOTE — Significant Event (Signed)
Rapid Response Event Note RN called for pt unresponsive Overview: Time Called: 8648 Arrival Time: 4720 Event Type: Neurologic  Initial Focused Assessment: On arrival pt supine in bed, skin warm and dry, ABG obtained and placed on Bipap prior to my arrival, pt unresponsive to painful and verbal stimuli. Pupils equal, round, reactive to light. Pt had intermittent episodes of rapid breathing, Dr. Lovena Le and Dr. Roanna Banning at bedside for one event. Dr. Cristobal Goldmann consulted, pt taking for STAT CT of head. Pt then transported to 4N17 where she gradually became more responsive, pt able to move right arm and leg spontaneously, responded to pain on right side, no response to pain or movement to left side.  Interventions: Placed on Bipap, transported to CT     Event Summary: Name of Physician Notified: Dr. Roanna Banning  at 1250  Name of Consulting Physician Notified: Dr. Cristobal Goldmann  at 1315  Outcome: Transferred (Comment) (bed switched from 3w to 4n )     [place], Sela Hua

## 2016-09-27 NOTE — Procedures (Signed)
ELECTROENCEPHALOGRAM REPORT  Date of Study: 09/27/2016  Patient's Name: Kathy Rosales MRN: [id number] Date of Birth: Aug 17, 1975  Referring Provider: Etta Quill, PA-C  Clinical History: This is a 41 year old woman with altered mental status, left-sided symptoms.  Medications: acetaminophen (TYLENOL) tablet 325-650 mg  albuterol (PROVENTIL) (2.5 MG/3ML) 0.083% nebulizer solution 2.5 mg  aspirin EC tablet 81 mg  carvedilol (COREG) tablet 6.25 mg  diphenhydrAMINE (BENADRYL) tablet 50 mg  fentaNYL (SUBLIMAZE) injection 25-50 mcg  furosemide (LASIX) tablet 40 mg  ketorolac (TORADOL) 30 MG/ML injection 30 mg  losartan (COZAAR) tablet 12.5 mg  ondansetron (ZOFRAN) injection 4 mg  ondansetron (ZOFRAN) injection 4 mg  PARoxetine (PAXIL) tablet 30 mg  potassium chloride SA (K-DUR,KLOR-CON) CR tablet 20 mEq  spironolactone (ALDACTONE) tablet 12.5 mg  traZODone (DESYREL) tablet 200 mg  vancomycin (VANCOCIN) IVPB 1000 mg/200 mL premix   Technical Summary: A multichannel digital EEG recording measured by the international 10-20 system with electrodes applied with paste and impedances below 5000 ohms performed in our laboratory with EKG monitoring in an awake and asleep patient.  Hyperventilation and photic stimulation were not performed.  The digital EEG was referentially recorded, reformatted, and digitally filtered in a variety of bipolar and referential montages for optimal display.    Description: The patient is awake and asleep during the recording.  During maximal wakefulness, there is a symmetric, medium voltage 8 Hz posterior dominant rhythm that attenuates with eye opening.  The record is symmetric.  During drowsiness and sleep, there is an increase in theta slowing of the background.  Vertex waves and symmetric sleep spindles were seen.  Hyperventilation and photic stimulation were not performed.  There were no epileptiform discharges or electrographic seizures seen.     EKG lead was unremarkable.  Impression: This awake and asleep EEG is normal.    Clinical Correlation: A normal EEG does not exclude a clinical diagnosis of epilepsy. Clinical correlation is advised.   Ellouise Newer, M.D.

## 2016-09-27 NOTE — Progress Notes (Signed)
Rt called to cath lab recovery to placed pt on  BIPAP. Pt placed on  BIPAP 14/6 40% and ABG obtained.

## 2016-09-27 NOTE — Progress Notes (Signed)
Notified by RN that patient failed swallow study, was c/o headache and surgical pain. VSS Asked RN to make sure neurology aware as well of study and c/o headache D/w Dr. Lovena Le made aware of swallow study continue IV Toradol that had already been ordered PO medicines d/c'd Pt NPO  Tommye Standard, PA-C  EP attending  Patient seen and examined. Of intensive the last several hours have been reviewed. 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 Her neurologic status has improved. She is in significant pain and will be treated with intravenous or intramuscular Toradol. She apparently failed her swallowing study. She may require additional observation but my expectation is that she will return to normal. Results of the CT scan were reviewed. EEG is pending.  Cristopher Peru, M.D.

## 2016-09-27 NOTE — Consult Note (Signed)
NEURO HOSPITALIST CONSULT NOTE   Requestig physician: Dr. Lovena Le   Reason for Consult: left sided weakness after surgery   History obtained from:   Chart    HPI:                                                                                                                                          Kathy Rosales Elysa Womac is an 41 y.o. female who is diagnosis chronic systolic heart failure and came to [facility] to have a ICD implantation. Post implantation and while in PACU was noted the patient was having difficulty time waking up. Neurology was asked see the patient. I, consultation patient was on BiPAP she was waking up slightly and was able to nod that she could feel her right arm and right leg but not her left arm and left leg. Other than that she was nonfocal. Patient did not follow any other commands. Patient was staring forward did not blink to threat did not track my fingers. Patient was showing no seizure activity or abnormal movements. She was moving her right arm and leg spontaneously but no movement in her left with no response to pain on her left also.  Past Medical History:  Diagnosis Date  . Acute systolic CHF (congestive heart failure) ([place]) 05/22/2016  . Blood transfusion without reported diagnosis   . 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 COLONOSCOPY WITH PROPOFOL N/A 07/23/2015   Procedure: COLONOSCOPY WITH PROPOFOL;  Surgeon: Ronald Lobo, MD;  Location: [facility];  Service: Endoscopy;  Laterality: N/A;  . ESOPHAGOGASTRODUODENOSCOPY (EGD) WITH PROPOFOL N/A 07/23/2015   Procedure: ESOPHAGOGASTRODUODENOSCOPY (EGD) WITH PROPOFOL;  Surgeon: Ronald Lobo, MD;  Location: [facility];  Service: Endoscopy;  Laterality: N/A;  . ESOPHAGOGASTRODUODENOSCOPY (EGD) WITH PROPOFOL N/A 11/18/2015   Procedure: ESOPHAGOGASTRODUODENOSCOPY (EGD) WITH PROPOFOL;  Surgeon: Ronald Lobo, MD;  Location: [facility]  ENDOSCOPY;  Service: Endoscopy;  Laterality: N/A;  . RIGHT/LEFT HEART CATH AND CORONARY ANGIOGRAPHY N/A 05/22/2016   Procedure: Right/Left Heart Cath and Coronary Angiography;  Surgeon: Peter M Martinique, MD;  Location: [place] [facility];  Service: Cardiovascular;  Laterality: N/A;    Family History  Problem Relation Age of Onset  . Cancer Paternal Grandfather   . Mental illness Neg Hx       Social History:  reports that she has quit smoking. She has never used smokeless tobacco. She reports that she does not drink alcohol or use drugs.  Allergies  Allergen Reactions  . Tramadol Itching and Other (See Comments)    Red spots all over body and itching  . Leflunomide Rash    MEDICATIONS:  Prior to Admission:  Prescriptions Prior to Admission  Medication Sig Dispense Refill Last Dose  . albuterol (PROVENTIL HFA;VENTOLIN HFA) 108 (90 Base) MCG/ACT inhaler Inhale 2 puffs into the lungs every 6 (six) hours as needed for wheezing or shortness of breath. 1 Inhaler 2 Past Week at Unknown time  . aspirin EC 81 MG tablet Take 1 tablet (81 mg total) by mouth daily. 90 tablet 3 09/26/2016 at 0800  . Aspirin-Salicylamide-Caffeine (BC HEADACHE PO) Take 1 packet by mouth 4 (four) times daily as needed (headaches).   Taking  . carvedilol (COREG) 6.25 MG tablet Take 1 tablet (6.25 mg total) by mouth 2 (two) times daily. 60 tablet 3 09/26/2016 at Unknown time  . diphenhydrAMINE (BENADRYL) 25 MG tablet Take 50 mg by mouth 2 (two) times daily.   Taking  . furosemide (LASIX) 20 MG tablet Take 2 tablets (40 mg total) by mouth daily. 180 tablet 3 09/26/2016 at 0800  . losartan (COZAAR) 25 MG tablet Take 0.5 tablets (12.5 mg total) by mouth at bedtime. 15 tablet 5 09/26/2016 at Unknown time  . PARoxetine (PAXIL) 30 MG tablet Take 1 tablet (30 mg total) by mouth daily. 30 tablet 3 09/26/2016 at Unknown  time  . potassium chloride 20 MEQ TBCR Take 20 mEq by mouth daily. 90 tablet 3 09/26/2016 at Unknown time  . riboflavin (VITAMIN B-2) 100 MG TABS tablet Take 200 mg by mouth daily.   09/26/2016 at Unknown time  . spironolactone (ALDACTONE) 25 MG tablet Take 0.5 tablets (12.5 mg total) by mouth daily. 15 tablet 3 09/26/2016 at Unknown time  . traZODone (DESYREL) 100 MG tablet Take 200 mg by mouth at bedtime.    09/26/2016 at Unknown time  . digoxin (LANOXIN) 0.125 MG tablet Take 1 tablet (0.125 mg total) by mouth daily. 90 tablet 3 Taking     ROS:                                                                                                                                       History obtained from unobtainable from patient due to mental status   Blood pressure 133/83, pulse 70, temperature 97 F (36.1 C), resp. rate 11, height 5\' 3"  (1.6 m), weight 59 kg (130 lb), SpO2 100 %.   Neurologic Examination:                                                                                                      HEENT-  Normocephalic,  no lesions, without obvious abnormality.  Normal external eye and conjunctiva.  Normal TM's bilaterally.  Normal auditory canals and external ears. Normal external nose, mucus membranes and septum.  Normal pharynx. Cardiovascular- S1, S2 normal, pulses palpable throughout   Lungs- chest clear, no wheezing, rales, normal symmetric air entry Abdomen- normal findings: bowel sounds normal Extremities- no edema Lymph-no adenopathy palpable Musculoskeletal-no joint tenderness, deformity or swelling Skin-warm and dry, no hyperpigmentation, vitiligo, or suspicious lesions  Neurological Examination Mental Status: Patient is currently on BiPAP. Patient is nonvocal. Patient follows minimal commands such as wiggling her right fingers, and nodding yes or no to pinprick on the right side versus left side. Cranial Nerves: II: No blink to threat bilaterally III,IV, VI: Doll's intact  intact bilaterally pupils equal, round, reactive to light and accommodation V,VII: Face symmetric, facial light touch sensation normal bilaterally VIII: Does respond to verbal stimuli  Motor: Moving right leg and right arm antigravity but cannot hold antigravity for very long. No movement of left arm and leg. Sensory: Pinprick and light touch intact only on right leg and right arm Deep Tendon Reflexes: Press throughout Plantars: Right: downgoing   Left: downgoing Cerebellar: Unable to assess Gait: Unable to assess      Lab Results: Basic Metabolic Panel:  Recent Labs Lab 09/22/16 0856  NA 141  K 3.5  CL 107*  CO2 19*  GLUCOSE 128*  BUN 15  CREATININE 0.71  CALCIUM 8.3*    Liver Function Tests: No results for input(s): AST, ALT, ALKPHOS, BILITOT, PROT, ALBUMIN in the last 168 hours. No results for input(s): LIPASE, AMYLASE in the last 168 hours. No results for input(s): AMMONIA in the last 168 hours.  CBC:  Recent Labs Lab 09/22/16 0856  WBC 5.2  NEUTROABS 3.8  HGB 13.1  HCT 39.1  MCV 89  PLT 227    Cardiac Enzymes: No results for input(s): CKTOTAL, CKMB, CKMBINDEX, TROPONINI in the last 168 hours.  Lipid Panel: No results for input(s): CHOL, TRIG, HDL, CHOLHDL, VLDL, LDLCALC in the last 168 hours.  CBG:  Recent Labs Lab 09/27/16 1248  GLUCAP 110*    Microbiology: Results for orders placed or performed during the hospital encounter of 09/27/16  Surgical PCR screen     Status: None   Collection Time: 09/27/16  6:32 AM  Result Value Ref Range Status   MRSA, PCR NEGATIVE NEGATIVE Final   Staphylococcus aureus NEGATIVE NEGATIVE Final    Comment:        The Xpert SA Assay (FDA approved for NASAL specimens in patients over 41 years of age), is one component of a comprehensive surveillance program.  Test performance has been validated by [facility] for patients greater than or equal to 57 year old. It is not intended to diagnose infection nor  to guide or monitor treatment.     Coagulation Studies: No results for input(s): LABPROT, INR in the last 72 hours.  Imaging: Ct Head Wo Contrast  Result Date: 09/27/2016 CLINICAL DATA:  Unresponsive following cardiac catheterization EXAM: CT HEAD WITHOUT CONTRAST TECHNIQUE: Contiguous axial images were obtained from the base of the skull through the vertex without intravenous contrast. COMPARISON:  07/18/2016 FINDINGS: Brain: No evidence of acute infarction, hemorrhage, hydrocephalus, extra-axial collection or mass lesion/mass effect. Vascular: No hyperdense vessel or unexpected calcification. Skull: Normal. Negative for fracture or focal lesion. Sinuses/Orbits: No acute finding. Other: None. IMPRESSION: Normal head CT without contrast Electronically Signed   By: Jerilynn Mages.  Shick M.D.   On:  09/27/2016 13:38       Assessment and plan per attending neurologist  Etta Quill PA-C [facility] [phone]  09/27/2016, 2:03 PM   Assessment/Plan: This is a 41 year old female status post ICD implantation. While in PACU patient had difficulty with arousal and continues to be somnolent but not moving left side as well as right side. Although there was no intra-arterial or aortic involvement it would be odd for person to have a stroke with this form of surgery however patient is not moving her left side which brings up the question of possible seizure however again this would be unusual seeing she has no history of seizure.  Recommend: 1-CTA of head and neck to evaluate for any form of thrombus 2- EEG to evaluate for any form of seizure 3- continue to evaluate neurologically for improvement as she may be taking longer than usual to resolve from anesthesia.

## 2016-09-27 NOTE — Progress Notes (Signed)
Upon initial assessment, pt alert/oriented X4, able to move extremities on R side w/o difficulty - alert and able to have conversation with RN; also stated that H/A was absent and pain was minimal in chest from procedure. At about 2130, pt c/o severe H/A and chest pain. No PRN medications could be given at that time - nursing interventions completed in attempt to decrease pain (sitting pt up, deep breathing exercises, etc.) w/o success. At that time, pt became tachypneic (sats remained >98%) and then completely unresponsive to painful stimuli. Cardiology MD notified - no new orders given as this has taken place a couple of times throughout the day. Neurology MD also notified - orders for one time dose of fentanyl 12.5 IVP given in attempt to decrease pain/anxiety. Yet, MD stated no concern for worsening neuro condition, as it is believed to be psychogenic.   Will continue to monitor closely.  Guadalupe Maple, RN

## 2016-09-27 NOTE — Progress Notes (Signed)
Called by RN due to pt requesting pain rx. Sig problems w/ sedation earlier, will not use narcotics.  Failed swallow screen.  Tylenol supp ordered.  Lenoard Aden 09/27/2016 5:45 PM Beeper [phone]

## 2016-09-27 NOTE — Progress Notes (Signed)
Pt transported to CT on  BIPAP with no compilations at this time.

## 2016-09-27 NOTE — Progress Notes (Signed)
Anesthesia attempted to place oral airway but patient was fighting it some. Placed on 15L nonrebreather mask. Respirations regular, nonlabored. Still unresponsive. Pink, warm and dry

## 2016-09-27 NOTE — Progress Notes (Signed)
Patient started to breath rapid and shallow. Unresponsive to verbal and sternal rub. No respirations. Started bagging. O2 sats did not drop. Temp 98.1 at 1130. Skin w/d. Anesthesia paged.

## 2016-09-27 NOTE — Anesthesia Procedure Notes (Signed)
Procedure Name: LMA Insertion Date/Time: 09/27/2016 8:49 AM Performed by: Candis Shine Pre-anesthesia Checklist: Patient identified, Emergency Drugs available, Suction available and Patient being monitored Patient Re-evaluated:Patient Re-evaluated prior to inductionOxygen Delivery Method: Circle System Utilized Preoxygenation: Pre-oxygenation with 100% oxygen Intubation Type: IV induction LMA: LMA inserted LMA Size: 4.0 Number of attempts: 1 Tube secured with: Tape Dental Injury: Teeth and Oropharynx as per pre-operative assessment

## 2016-09-27 NOTE — Progress Notes (Signed)
EEG completed, results pending. 

## 2016-09-27 NOTE — H&P (Signed)
HPI The patient is a 41 yo woman referred by Dr. Aundra Dubin for consideration of ICD insertion. She was diagnosed with chronic systolic heart failure several months ago, and has been on guideline directed medical therapy. Repeat echo has demonstrated no improvement in her LV function. She has a narrow QRS. The patient has not had syncope.She feels some fatigue and weakness.      Allergies  Allergen Reactions  . Tramadol Itching and Other (See Comments)    Red spots all over body and itching  . Leflunomide Rash           Current Outpatient Prescriptions  Medication Sig Dispense Refill  . albuterol (PROVENTIL HFA;VENTOLIN HFA) 108 (90 Base) MCG/ACT inhaler Inhale 2 puffs into the lungs every 6 (six) hours as needed for wheezing or shortness of breath. 1 Inhaler 2  . aspirin EC 81 MG tablet Take 1 tablet (81 mg total) by mouth daily. 90 tablet 3  . benzonatate (TESSALON PERLES) 100 MG capsule Take 1 capsule (100 mg total) by mouth 3 (three) times daily as needed for cough. 90 capsule 3  . carvedilol (COREG) 3.125 MG tablet Take 1 tablet (3.125 mg total) by mouth 2 (two) times daily. 180 tablet 3  . digoxin (LANOXIN) 0.125 MG tablet Take 1 tablet (0.125 mg total) by mouth daily. 90 tablet 3  . furosemide (LASIX) 20 MG tablet Take 2 tablets (40 mg total) by mouth daily. 180 tablet 3  . ivabradine (CORLANOR) 5 MG TABS tablet Take 2.5 mg by mouth 2 (two) times daily with a meal.    . losartan (COZAAR) 25 MG tablet Take 0.5 tablets (12.5 mg total) by mouth at bedtime. 15 tablet 5  . PARoxetine (PAXIL) 30 MG tablet Take 1 tablet (30 mg total) by mouth daily. 30 tablet 3  . potassium chloride 20 MEQ TBCR Take 20 mEq by mouth daily. 90 tablet 3  . riboflavin (VITAMIN B-2) 100 MG TABS tablet Take 200 mg by mouth daily.    . traZODone (DESYREL) 100 MG tablet Take 200 mg by mouth at bedtime.     . triamcinolone cream (KENALOG) 0.1 % Apply 1 application topically as needed.  3    No current facility-administered medications for this visit.          Past Medical History:  Diagnosis Date  . Acute systolic CHF (congestive heart failure) ([place]) 05/22/2016  . Blood transfusion without reported diagnosis   . 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        Past Surgical History:  Procedure Laterality Date  . COLONOSCOPY WITH PROPOFOL N/A 07/23/2015   Procedure: COLONOSCOPY WITH PROPOFOL;  Surgeon: Ronald Lobo, MD;  [facility]: [facility];  Service: Endoscopy;  Laterality: N/A;  . ESOPHAGOGASTRODUODENOSCOPY (EGD) WITH PROPOFOL N/A 07/23/2015   Procedure: ESOPHAGOGASTRODUODENOSCOPY (EGD) WITH PROPOFOL;  Surgeon: Ronald Lobo, MD;  Location: [facility];  Service: Endoscopy;  Laterality: N/A;  . ESOPHAGOGASTRODUODENOSCOPY (EGD) WITH PROPOFOL N/A 11/18/2015   Procedure: ESOPHAGOGASTRODUODENOSCOPY (EGD) WITH PROPOFOL;  Surgeon: Ronald Lobo, MD;  Location: [facility];  Service: Endoscopy;  Laterality: N/A;  . RIGHT/LEFT HEART CATH AND CORONARY ANGIOGRAPHY N/A 05/22/2016   Procedure: Right/Left Heart Cath and Coronary Angiography;  Surgeon: Peter M Martinique, MD;  Location: [facility];  Service: Cardiovascular;  Laterality: N/A;          Family History  Problem Relation Age  of Onset  . Cancer Paternal Grandfather   . Mental illness Neg Hx      Social History        Social History  . Marital status: Married    Spouse name: N/A  . Number of children: 1  . Years of education: N/A       Occupational History  . unemployeed        Social History Main Topics  . Smoking status: Former Research scientist (life sciences)  . Smokeless tobacco: Never Used  . Alcohol use No  . Drug use: No  . Sexual activity: No       Other Topics Concern  . Not on file      Social History Narrative   ** Merged History Encounter **         BP 106/64   Pulse 64   Ht 5\' 3"  (1.6 m)   Wt 129  lb (58.5 kg)   SpO2 98%   BMI 22.85 kg/m   Physical Exam:  Well appearing 41 yo woman, NAD HEENT: Unremarkable Neck:  No JVD, no thyromegally Lymphatics:  No adenopathy Back:  No CVA tenderness Lungs:  Clear with no wheezes, rales, or rhonchi HEART:  Regular rate rhythm, no murmurs, no rubs, no clicks Abd:  soft, positive bowel sounds, no organomegally, no rebound, no guarding Ext:  2 plus pulses, no edema, no cyanosis, no clubbing Skin:  No rashes no nodules Neuro:  CN II through XII intact, motor grossly intact  EKG - reviewed - NSR with a narrow QRS  Assess/Plan: 1. Chronic systolic heart failure - she is on maximal medical therapy and she has class 2 symptoms. I have recommended that the patient undergo ICD implantation. I have reviewed the treatment option and ICD insertion. We discussed the treatment options with the patient. Specifically S-ICD vs standard ICD were reviewed. She is quite young and I think S-ICD would be her best treatment option. I have discussed the risks/benefits/goals/expectations of the procedure and she wishes to proceed. I spent over 45 minutes with the patient and her s.o who is her interpreter  Ponciano Ort.   EP Attending  Patient seen and examined. Agree with above. I have again reviewed the indications/risks/benefits/goals/expectations using an interpreter and she wishes to proceed.  Mikle Bosworth.D.

## 2016-09-27 NOTE — Progress Notes (Signed)
Dr. Roanna Banning here. Rapid Response here.

## 2016-09-28 ENCOUNTER — Ambulatory Visit: Payer: BLUE CROSS/BLUE SHIELD | Admitting: Cardiology

## 2016-09-28 DIAGNOSIS — I5022 Chronic systolic (congestive) heart failure: Secondary | ICD-10-CM | POA: Diagnosis not present

## 2016-09-28 MED ORDER — LOSARTAN POTASSIUM 25 MG PO TABS
12.5000 mg | ORAL_TABLET | Freq: Every day | ORAL | Status: DC
  Administered 2016-09-28 – 2016-09-29 (×2): 12.5 mg via ORAL
  Filled 2016-09-28: qty 0.5
  Filled 2016-09-28 (×2): qty 1

## 2016-09-28 MED ORDER — ACETAMINOPHEN 325 MG PO TABS
650.0000 mg | ORAL_TABLET | Freq: Four times a day (QID) | ORAL | Status: DC
  Administered 2016-09-28: 650 mg via ORAL
  Filled 2016-09-28: qty 2

## 2016-09-28 MED ORDER — KETOROLAC TROMETHAMINE 30 MG/ML IJ SOLN
30.0000 mg | Freq: Four times a day (QID) | INTRAMUSCULAR | Status: DC | PRN
  Administered 2016-09-28 – 2016-09-29 (×2): 30 mg via INTRAVENOUS
  Filled 2016-09-28 (×3): qty 1

## 2016-09-28 MED ORDER — TRAZODONE HCL 50 MG PO TABS
100.0000 mg | ORAL_TABLET | Freq: Every day | ORAL | Status: DC
  Administered 2016-09-28: 100 mg via ORAL
  Filled 2016-09-28: qty 2
  Filled 2016-09-28: qty 1

## 2016-09-28 MED ORDER — PAROXETINE HCL 30 MG PO TABS
30.0000 mg | ORAL_TABLET | Freq: Every day | ORAL | Status: DC
  Administered 2016-09-28 – 2016-09-29 (×2): 30 mg via ORAL
  Filled 2016-09-28 (×2): qty 1

## 2016-09-28 MED ORDER — ACETAMINOPHEN 325 MG PO TABS: 650.0000 mg | ORAL_TABLET | Freq: Four times a day (QID) | ORAL | Status: DC | PRN

## 2016-09-28 MED ORDER — CARVEDILOL 6.25 MG PO TABS
6.2500 mg | ORAL_TABLET | Freq: Two times a day (BID) | ORAL | Status: DC
  Administered 2016-09-28 – 2016-09-29 (×2): 6.25 mg via ORAL
  Filled 2016-09-28: qty 2
  Filled 2016-09-28: qty 1

## 2016-09-28 MED FILL — Naloxone HCl Inj 0.4 MG/ML: INTRAMUSCULAR | Qty: 1 | Status: AC

## 2016-09-28 MED FILL — Lidocaine HCl Local Preservative Free (PF) Inj 1%: INTRAMUSCULAR | Qty: 30 | Status: AC

## 2016-09-28 NOTE — Progress Notes (Signed)
Progress Note  Patient Name: Kathy Rosales Date of Encounter: 09/28/2016  Primary Cardiologist: Dr. Aundra Dubin  Subjective   Patient this morning is awake, much more animated and speaking in fluent English.  She c/o moderate incisional/surgical pain, but no SOB or CP, palpitations.   Inpatient Medications    Scheduled Meds:  Continuous Infusions:  PRN Meds: acetaminophen, albuterol, ketorolac, ondansetron (ZOFRAN) IV, ondansetron (ZOFRAN) IV   Vital Signs    Vitals:   09/28/16 0600 09/28/16 0700 09/28/16 0800 09/28/16 0900  BP: 108/76 111/65 113/65 118/78  Pulse: 66 63 73 82  Resp: 16 15 10 20   Temp:  98.2 F (36.8 C)    TempSrc:  Oral    SpO2: 99% 100% 100% 100%  Weight:      Height:        Intake/Output Summary (Last 24 hours) at 09/28/16 1036 Last data filed at 09/27/16 1759  Gross per 24 hour  Intake              600 ml  Output                0 ml  Net              600 ml   Filed Weights   09/27/16 0610  Weight: 130 lb (59 kg)    Telemetry    SR, no arrhythmias  - Personally Reviewed  ECG    Yesterday, SR, no acute changes - Personally Reviewed  Physical Exam   GEN: No acute distress, she is AAO x4  Neck: No JVD Cardiac: RRR, no murmurs, rubs, or gallops.  Respiratory: Clear to auscultation bilaterally. GI: Soft, nontender, non-distended  MS: No edema; No deformity. Neuro:  Nonfocal she follows all commands and is moving all extremeties Psych: Normal affect this morning, she is smiling and interacting with Dr. Lovena Le, RN and myself well  SQ ICD site and chest site is stable, no active bleeding, no hematoma   Labs    Chemistry Recent Labs Lab 09/22/16 0856 09/27/16 1409  NA 141 138  K 3.5 4.1  CL 107* 109  CO2 19* 22  GLUCOSE 128* 120*  BUN 15 6  CREATININE 0.71 0.60  CALCIUM 8.3* 8.6*  PROT  --  6.2*  ALBUMIN  --  3.8  AST  --  25  ALT  --  17  ALKPHOS  --  59  BILITOT  --  0.6  GFRNONAA 107 >60  GFRAA 123  >60  ANIONGAP  --  7     Hematology Recent Labs Lab 09/22/16 0856  WBC 5.2  RBC 4.41  HGB 13.1  HCT 39.1  MCV 89  MCH 29.7  MCHC 33.5  RDW 16.9*  PLT 227     Radiology    Ct Head Wo Contrast Result Date: 09/27/2016 CLINICAL DATA:  Unresponsive following cardiac catheterization EXAM: CT HEAD WITHOUT CONTRAST TECHNIQUE: Contiguous axial images were obtained from the base of the skull through the vertex without intravenous contrast. COMPARISON:  07/18/2016 FINDINGS: Brain: No evidence of acute infarction, hemorrhage, hydrocephalus, extra-axial collection or mass lesion/mass effect. Vascular: No hyperdense vessel or unexpected calcification. Skull: Normal. Negative for fracture or focal lesion. Sinuses/Orbits: No acute finding. Other: None. IMPRESSION: Normal head CT without contrast Electronically Signed   By: Jerilynn Mages.  Shick M.D.   On: 09/27/2016 13:38   Ct Angio Neck W Or Wo Contrast Result Date: 09/27/2016 CLINICAL DATA:  Patient awoke after S-ICD placement with LEFT  hemiparesis. EXAM: CT ANGIOGRAPHY HEAD AND NECK TECHNIQUE: Multidetector CT imaging of the head and neck was performed using the standard protocol during bolus administration of intravenous contrast. Multiplanar CT image reconstructions and MIPs were obtained to evaluate the vascular anatomy. Carotid stenosis measurements (when applicable) are obtained utilizing NASCET criteria, using the distal internal carotid diameter as the denominator. CONTRAST:  Isovue 370, 50 mL COMPARISON:  CT head earlier today. FINDINGS: CTA NECK Aortic arch: Bovine trunk. Imaged portion shows no evidence of aneurysm or dissection. No significant stenosis of the major arch vessel origins. Right carotid system: Unremarkable carotid bifurcation. No evidence of dissection, stenosis (50% or greater) or occlusion. Left carotid system: Unremarkable carotid bifurcation. No evidence of dissection, stenosis (50% or greater) or occlusion. Vertebral arteries:  Codominant. No evidence of dissection, stenosis (50% or greater) or occlusion. Nonvascular soft tissues: Subcutaneous air and electrode over the midline sternum status post S-ICD placement. No pneumothorax. No mediastinal hematoma or mass. Normal thyroid. No neck masses. No osseous findings. CTA HEAD Anterior circulation: Widely patent skull base and supraclinoid internal carotid arteries. Intact circle of Willis. No proximal stenosis or dissection. No aneurysm, or vascular malformation. Posterior circulation: Codominant vertebral arteries. Widely patent basilar. Posterior cerebral arteries and cerebellar branches widely patent. No significant stenosis, proximal occlusion, aneurysm, or vascular malformation. Venous sinuses: As permitted by contrast timing, patent. Anatomic variants: None of significance. Delayed phase:   No abnormal intracranial enhancement. Review of the MIP images confirms the above findings IMPRESSION: Negative CTA head and neck. No extracranial or intracranial stenosis, dissection, or occlusion. Electronically Signed   By: Staci Righter M.D.   On: 09/27/2016 15:35   Dg Chest Port 1 View Result Date: 09/27/2016 CLINICAL DATA:  Patient is unresponsive. Placement of an implantable defibrillator earlier today. EXAM: PORTABLE CHEST 1 VIEW COMPARISON:  Chest x-ray of July 18, 2016 FINDINGS: The lungs are adequately inflated and clear. The heart is top-normal in size. The pulmonary vascularity is normal. There is no pleural effusion or pneumothorax. The bony thorax is unremarkable. The implantable defibrillator electrode projects to the left of the spine with the generator in the left axillary region. IMPRESSION: There is no active cardiopulmonary disease. Correlation clinically as to the adequacy of the positioning of the defibrillator is needed. Electronically Signed   By: David  Martinique M.D.   On: 09/27/2016 15:30    Cardiac Studies   07/21/16 TTE Study Conclusions - Left ventricle: The  cavity size was moderately dilated. Wall   thickness was normal. Systolic function was severely reduced. The   estimated ejection fraction was in the range of 20% to 25%.   Diffuse hypokinesis. Features are consistent with a pseudonormal   left ventricular filling pattern, with concomitant abnormal   relaxation and increased filling pressure (grade 2 diastolic   dysfunction). - Aortic valve: There was trivial regurgitation. - Mitral valve: There was mild regurgitation. - Left atrium: The atrium was moderately dilated. - Pulmonary arteries: PA peak pressure: 31 mm Hg (S). Impressions: - Severe global reduction in LV systolic function; moderate LVE;   grade 2 diastolic dysfunction; trace AI; mild MR; moderate LAE;   mild TR.  Patient Profile     41 y.o. female with chronic CHF, NICM, and hx of pseudoseizure type episode post cath earlier in the year.  Came yesterday as out patient elective S-ICD implant, post procedure in recovery became unresponsive/difficult to arrouse  Assessment & Plan    1. AMS, L sided weakness, periods of unresponsiveness  Appreciate neuro eval     Negative EEG, brain CT scan and CT angio of head and neck     Neuro signed off with suspect/likely slow response to anesthesia or psychogenic  Strongly suspect psychogenic, yesterday patient was observed by RN yesterday while reporting an inability to move L leg/arm, when requesting bed pan bent/used both lower extremities equally to raise her hips to get on the bed pan, I observed her getting off the bed pan and she was able to use her L leg for this action as well, and when asked was able to move her L arm, though verbalized significant incisional/post-op pain as a component as well.  The patient this morning was completely oriented, speaking in English, smiling, and reported feeling much better, she was moving all extremities equally, asking for food/water, and looking forward to plans to go home once passing  swallow study.       2. NICM s/p S-ICD implant yesterday with Dr. Lovena Le     Implant sites are stable     Post implant device check done yesterday afternoon with intact function/stable findings     Exam is euvolemic     meds held yesterday given failed bedside swallow screen/testing       The patient was seen/examined by Dr. Lovena Le, with plans to discharge to home on same home meds once swallow study completed and passed.  Patient was agreeable to the plan and looking forward to going home.    Signed, Baldwin [place], PA-C  09/28/2016, 10:36 AM    EP Attending  Patient seen and examined. Agree with above. She has had a remarkable improvement and I strongly suspect she has a psychological problem, unknown to me prior to her procedure. Will try to encourage having her progress her activity. She c/o having trouble swallowing. While I suspect her throat might be a little sore from the ET tube, I think she will be ok. Her sub Q ICD is working normally.   Mikle Bosworth.D.

## 2016-09-28 NOTE — Progress Notes (Signed)
Patient sitting up in the chair.

## 2016-09-28 NOTE — Discharge Summary (Signed)
ELECTROPHYSIOLOGY PROCEDURE DISCHARGE SUMMARY    Patient ID: Kathy Rosales,  MRN: [id number], DOB/AGE: 1975/06/12 41 y.o.  Admit date: 09/27/2016 Discharge date: 09/29/16  Primary Care Physician: Ladell Pier, MD  Primary Cardiologist: Dr. Aundra Dubin Electrophysiologist: Dr. Lovena Le  Primary Discharge Diagnosis:  1. NICM 2. 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 Chronic CHF (systolic)     Well compensated here  Allergies  Allergen Reactions  . Tramadol Itching and Other (See Comments)    Red spots all over body and itching  . Leflunomide Rash     Procedures This Admission:  1.  Implantation of a BSci S-ICD on 09/27/16 by Dr Lovena Le.  The patient received a [place], serial [id number] DFT testing was successful with 65J    Brief HPI: Kathy Rosales is a 41 y.o. female was referred to electrophysiology in the outpatient setting for consideration of ICD implantation.  Past medical history includes NICM.  The patient has persistent LV dysfunction despite guideline directed therapy.  Risks, benefits, and alternatives to ICD implantation were reviewed with the patient who wished to proceed.   Hospital Course:  The patient was admitted and underwent implantation of a S-ICD with details as outlined above. She developed in post procedure area AMS/became unresponsive, neurology consult and w/u was undertaken.  The patient had very inconsistent symptoms and observations through the day/evening, CT head, CTA head/neck and EEG were all negative, her symptoms did eventually completely resolve and neurology signed off suspected to have been slow response to anesthesia or psychogenic.  The patient initially failed swallow screen, but was able to advance her diet and today the day of discharge was able to eat well and ambulated.  She c/o p/o pain that was significant though with allergy to opioid and prior AMS reluctant to  use narcotic, we were able to achieve control with toradol and tylenol.  The patient reports her p/o pain as much improved today as well, and historically has used BC powder for headaches and pain at home, though ultimately did want to have the toradol to go home with (confirmed dosing and schedule with pharmacy).   Her vitals remained stable throughout, she was monitored on telemetry throughout her stay which demonstrated SR.  Surgical sites, chest and L infra-axillary/device sites were without hematoma or ecchymosis.  The device was interrogated post procedure functioning normally.  Wound care, arm mobility, and restrictions were reviewed with the patient.  The patient was examined by Dr. Lovena Le and considered stable for discharge to home. The patient reports feeling well this morning, much improved pain, has ambulated and eaten.  She is looking forward to being discharged.  The patient's discharge medications include an ARB (losartan) and beta blocker (carvedilol).   Physical Exam: Vitals:   09/29/16 0008 09/29/16 0300 09/29/16 0425 09/29/16 0842  BP: (!) 91/55  (!) 106/57 121/74  Pulse: 69 61 (!) 57 68  Resp: 12 11 13 17   Temp: 97.6 F (36.4 C)  99.2 F (37.3 C) 98.6 F (37 C)  TempSrc: Oral  Oral Oral  SpO2: 100% 100% 100% 100%  Weight:      Height:        GEN- The patient is well appearing this morning, alert and oriented x 4 today.   HEENT: normocephalic, atraumatic; sclera clear, conjunctiva pink; hearing intact; oropharynx clear Lungs- CTA b/l, normal work of breathing.  No wheezes, rales, rhonchi Heart- RRR, no murmurs,  rubs or gallops, PMI not laterally displaced GI- soft, non-tender, non-distended Extremities- no clubbing, cyanosis, or edema MS- no significant deformity or atrophy Skin- warm and dry, no rash or lesion, surgical sites are stable, no hematoma, no active bleeding is appreciated Psych- euthymic mood, full affect Neuro- no gross defecits are appreciated  Labs:    Lab Results  Component Value Date   WBC 5.2 09/22/2016   HGB 13.1 09/22/2016   HCT 39.1 09/22/2016   MCV 89 09/22/2016   PLT 227 09/22/2016     Recent Labs Lab 09/27/16 1409  NA 138  K 4.1  CL 109  CO2 22  BUN 6  CREATININE 0.60  CALCIUM 8.6*  PROT 6.2*  BILITOT 0.6  ALKPHOS 59  ALT 17  AST 25  GLUCOSE 120*    Discharge Medications:  Allergies as of 09/29/2016      Reactions   Tramadol Itching, Other (See Comments)   Red spots all over body and itching   Leflunomide Rash      Medication List    TAKE these medications   albuterol 108 (90 Base) MCG/ACT inhaler Commonly known as:  PROVENTIL HFA;VENTOLIN HFA Inhale 2 puffs into the lungs every 6 (six) hours as needed for wheezing or shortness of breath.   aspirin EC 81 MG tablet Take 1 tablet (81 mg total) by mouth daily.   BC HEADACHE PO Take 1 packet by mouth 4 (four) times daily as needed (headaches).   carvedilol 6.25 MG tablet Commonly known as:  COREG Take 1 tablet (6.25 mg total) by mouth 2 (two) times daily.   digoxin 0.125 MG tablet Commonly known as:  LANOXIN Take 1 tablet (0.125 mg total) by mouth daily.   diphenhydrAMINE 25 MG tablet Commonly known as:  BENADRYL Take 50 mg by mouth 2 (two) times daily.   furosemide 20 MG tablet Commonly known as:  LASIX Take 2 tablets (40 mg total) by mouth daily.   ketorolac 10 MG tablet Commonly known as:  TORADOL Take 1 tablet (10 mg total) by mouth every 8 (eight) hours as needed for moderate pain.   losartan 25 MG tablet Commonly known as:  COZAAR Take 0.5 tablets (12.5 mg total) by mouth at bedtime.   PARoxetine 30 MG tablet Commonly known as:  PAXIL Take 1 tablet (30 mg total) by mouth daily.   Potassium Chloride ER 20 MEQ Tbcr Take 20 mEq by mouth daily.   riboflavin 100 MG Tabs tablet Commonly known as:  VITAMIN B-2 Take 200 mg by mouth daily.   spironolactone 25 MG tablet Commonly known as:  ALDACTONE Take 0.5 tablets (12.5 mg  total) by mouth daily.   traZODone 100 MG tablet Commonly known as:  DESYREL Take 200 mg by mouth at bedtime.       Disposition: Home Discharge Instructions    Diet - low sodium heart healthy    Complete by:  As directed    Increase activity slowly    Complete by:  As directed      Follow-up Information    [place] [facility] Follow up on 10/12/2016.   Specialty:  Cardiology Why:  11:00AM, wound check visit Contact information: [address], [address] [place]       Evans Lance, MD Follow up on 12/26/2016.   Specialty:  Cardiology Why:  1:45PM Contact information: [address] [place] [address] [phone]  [facility] Follow up.   Specialty:  Cardiology Why:  10/05/16 @ 3:30 (Lab visit) 11/06/16 @3 :00 (office visit) Contact information: [address] [id number] [place] [phone]          Duration of Discharge Encounter: Greater than 30 minutes including physician time.  Venetia Night, PA-C 09/29/2016 11:10 AM  EP Attending  Patient seen and examined. Agree with above. She is ready for DC. Usual followup.  Mikle Bosworth.D.

## 2016-09-28 NOTE — Anesthesia Postprocedure Evaluation (Signed)
Anesthesia Post Note  Patient: Kathy Rosales  Procedure(s) Performed: Procedure(s) (LRB): SubQ ICD Implant (N/A)     Patient location during evaluation: ICU (Neuro ICU, 4 north) Anesthesia Type: General Level of consciousness: awake Pain management: pain level controlled Vital Signs Assessment: post-procedure vital signs reviewed and stable Respiratory status: spontaneous breathing, nonlabored ventilation and respiratory function stable Cardiovascular status: blood pressure returned to baseline and stable Postop Assessment: no signs of nausea or vomiting Anesthetic complications: no Comments: Patient evaluated in room. No anesthetic explanation for post procedure course at this time. Will defer to neurology for further recommendations.      Last Vitals:  Vitals:   09/28/16 0600 09/28/16 0700  BP: 108/76 111/65  Pulse: 66 63  Resp: 16 15  Temp:      Last Pain:  Vitals:   09/28/16 0400  TempSrc: Oral  PainSc:                  Blade Scheff P Daley Gosse

## 2016-09-28 NOTE — Progress Notes (Signed)
EEG normal CTA head and neck normal.  Likely slow response to anesthesia or psychogenic ---neuro will S/O.   Etta Quill PA-C [facility] [phone]  M-F  (8:30 am- 4 PM)  09/28/2016, 8:21 AM

## 2016-09-28 NOTE — Progress Notes (Signed)
Patient took few steps in the room. Complained of dizziness and was helped back to bed.

## 2016-09-28 NOTE — Progress Notes (Signed)
Pt refusing to ambulate at this time.  C/o pain at op site is too great to ambulate in the hallway, despite encouragement from RN.  Pt also refusing to stay sitting in the recliner AND refusing to eat.

## 2016-09-28 NOTE — Progress Notes (Signed)
The patient is AAO x3, moving all extremities without any gross focal deficits.  She speaks to me using very good [place].  Though Family in room also translates.  I asked the patient if she needed Spanish translation and she tells me no.  Patient very reluctant to ambulate 2/2 site discomfort.   She denies any pain otherwise, no SOB.  Her only concern is of site pain management. I discussed the importance of early ambulation and she understands is willing to try. She has allergy to opioids When asked what she would like or usually takes for pain she tells me ASA I explained to her the IV medicine she is getting is better then that and she said it is helping. (The RN with her today mentions that earlier when she asked her about pain the patient denied any) The patient was able to walk from her bed to the door with a walker and RN assist before she reported feeling to weak, sat for a minute or so and ambulated back to bed.  The patient was observed initially by staff to do well with liquids, though eventually said she refused to take in more, spitting it out.  I discussed with Dr. Lovena Le. OK to advance her diet and to resume her home meds and continue IV Toradol, I will add Tylenol.  I discussed with the patient importance of keep HOB up, being out of bed with efforts of ambulation, she states she understands.    EP Attending Agree with above. We will try and get her to go home.  Mikle Bosworth.D.

## 2016-09-28 NOTE — Evaluation (Signed)
Clinical/Bedside Swallow Evaluation Patient Details  Name: Kathy Rosales MRN: [id number] Date of Birth: 1975/07/23  Today's Date: 09/28/2016 Time: SLP Start Time (ACUTE ONLY): 0254 SLP Stop Time (ACUTE ONLY): 0948 SLP Time Calculation (min) (ACUTE ONLY): 23 min  Past Medical History:  Past Medical History:  Diagnosis Date  . Acute systolic CHF (congestive heart failure) ([place]) 05/22/2016  . Blood transfusion without reported diagnosis   . 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 COLONOSCOPY WITH PROPOFOL N/A 07/23/2015   Procedure: COLONOSCOPY WITH PROPOFOL;  Surgeon: Ronald Lobo, MD;  Location: [facility];  Service: Endoscopy;  Laterality: N/A;  . ESOPHAGOGASTRODUODENOSCOPY (EGD) WITH PROPOFOL N/A 07/23/2015   Procedure: ESOPHAGOGASTRODUODENOSCOPY (EGD) WITH PROPOFOL;  Surgeon: Ronald Lobo, MD;  Location: [facility];  Service: Endoscopy;  Laterality: N/A;  . ESOPHAGOGASTRODUODENOSCOPY (EGD) WITH PROPOFOL N/A 11/18/2015   Procedure: ESOPHAGOGASTRODUODENOSCOPY (EGD) WITH PROPOFOL;  Surgeon: Ronald Lobo, MD;  Location: [facility];  Service: Endoscopy;  Laterality: N/A;  . RIGHT/LEFT HEART CATH AND CORONARY ANGIOGRAPHY N/A 05/22/2016   Procedure: Right/Left Heart Cath and Coronary Angiography;  Surgeon: Peter M Martinique, MD;  Location: [place] CV LAB;  Service: Cardiovascular;  Laterality: N/A;  . SUBQ ICD IMPLANT N/A 09/27/2016   Procedure: SubQ ICD Implant;  Surgeon: Evans Lance, MD;  Location: [place] CV LAB;  Service: Cardiovascular;  Laterality: N/A;   HPI:  Ptis a 41 y.o.female s/p ICD implantation who developed difficulty arousing and moving her L side post-procedure. EEG and CTA Head/Neck normal with concern for slow response to anesthesia or pyschogenic nature per neurology. PMH includes CHF.   Assessment / Plan / Recommendation Clinical Impression  Kathy Rosales exhibits difficulty  swallowing that is likely exacerbated by her inability to tolerate an upright posture. Kathy Rosales reports feeling dizzy with attempts to elevate the HOB, not sufficiently improved with attempting reverse Trendelenburg. She will sit up partially, attempt minimal sips, and then request to lay back down. Her family also shares concern that when she starts to feel dizzy it has been precipitating an episode of unresponsiveness.   While taking in minimal POs, Kathy Rosales had one immediate cough following her initial sip of thin liquid, which was improved with a subtle increase in positioning and Min cues for smaller sips. She attempted two bites of purees, attempting oral transit but then seemingly gagging and allowing the entire bolus to spill anteriorly from her mouth.   Discussed with Kathy Rosales/family - SLP can initiate a clear liquid diet to allow for sips of thin liquid only when Kathy Rosales can tolerate a more upright position. POs should be held if coughing. I worry that her intake today will likely be very limited. SLP will f/u on next date to assess for potential to advance.  SLP Visit Diagnosis: Dysphagia, unspecified (R13.10)    Aspiration Risk  Moderate aspiration risk (mostly due to positioning)    Diet Recommendation Thin liquid   Liquid Administration via: Cup;Straw Medication Administration: Other (Comment) (liquid form or by alternative means) Supervision: Staff to assist with self feeding;Full supervision/cueing for compensatory strategies Compensations: Slow rate;Small sips/bites Postural Changes: Seated upright at 90 degrees    Other  Recommendations Oral Care Recommendations: Oral care QID Other Recommendations: Have oral suction available   Follow up Recommendations Other (comment) (tba)      Frequency and Duration min 2x/week  2 weeks       Prognosis Prognosis for Safe Diet Advancement: Good  Swallow Study   General HPI: Ptis a 41 y.o.female s/p ICD implantation who developed difficulty  arousing and moving her L side post-procedure. EEG and CTA Head/Neck normal with concern for slow response to anesthesia or pyschogenic nature per neurology. PMH includes CHF. Type of Study: Bedside Swallow Evaluation Previous Swallow Assessment: none in chart Diet Prior to this Study: NPO Temperature Spikes Noted: No Respiratory Status: Room air History of Recent Intubation: No Behavior/Cognition: Alert;Cooperative;Pleasant mood Oral Care Completed by SLP: No Oral Cavity - Dentition: Adequate natural dentition Vision: Functional for self-feeding Self-Feeding Abilities: Needs assist Patient Positioning: Partially reclined (Kathy Rosales limited by dizziness) Baseline Vocal Quality: Normal    Oral/Motor/Sensory Function     Ice Chips Ice chips: Not tested   Thin Liquid Thin Liquid: Impaired Presentation: Straw Pharyngeal  Phase Impairments: Cough - Immediate    Nectar Thick Nectar Thick Liquid: Not tested   Honey Thick Honey Thick Liquid: Not tested   Puree Puree: Impaired Presentation: Spoon Oral Phase Functional Implications: Other (comment) (anterior spillage) Pharyngeal Phase Impairments: Unable to trigger swallow   Solid   GO   Solid: Not tested    Functional Assessment Tool Used: skilled clinical judgment Functional Limitations: Swallowing Swallow Current Status (L8316): At least 60 percent but less than 80 percent impaired, limited or restricted Swallow Goal Status [phone]): At least 20 percent but less than 40 percent impaired, limited or restricted   Kathy Rosales 09/28/2016,10:14 AM  Kathy Rosales, M.A. CCC-SLP [phone]

## 2016-09-29 DIAGNOSIS — I5022 Chronic systolic (congestive) heart failure: Secondary | ICD-10-CM | POA: Diagnosis not present

## 2016-09-29 MED ORDER — KETOROLAC TROMETHAMINE 10 MG PO TABS: 10.0000 mg | ORAL_TABLET | Freq: Three times a day (TID) | ORAL | 0 refills | Status: DC | PRN

## 2016-09-29 MED ORDER — ACETAMINOPHEN 325 MG PO TABS: 650.0000 mg | ORAL_TABLET | Freq: Four times a day (QID) | ORAL | Status: DC | PRN

## 2016-09-29 NOTE — Progress Notes (Signed)
  Speech Language Pathology Treatment: Dysphagia  Patient Details Name: Kathy Rosales MRN: [id number] DOB: 09/17/1975 Today's Date: 09/29/2016 Time: 4299-8069 SLP Time Calculation (min) (ACUTE ONLY): 11 min  Assessment / Plan / Recommendation Clinical Impression  Pt will allow for a more upright position during PO intake today, although still not fully near 90 degrees. Regardless, she consumed a variety of textures with no overt signs of aspiration or dysphagia. She reports subjectively feeling improvements in her swallowing today. Will advance diet to regular textures and thin liquids. SLP f/u not necessary.   HPI HPI: Ptis a 41 y.o.female s/p ICD implantation who developed difficulty arousing and moving her L side post-procedure. EEG and CTA Head/Neck normal with concern for slow response to anesthesia or pyschogenic nature per neurology. PMH includes CHF.      SLP Plan  All goals met       Recommendations  Diet recommendations: Regular;Thin liquid Liquids provided via: Cup;Straw Medication Administration: Whole meds with liquid Supervision: Patient able to self feed;Intermittent supervision to cue for compensatory strategies Compensations: Slow rate;Small sips/bites Postural Changes and/or Swallow Maneuvers: Seated upright 90 degrees                Oral Care Recommendations: Oral care BID Follow up Recommendations: None SLP Visit Diagnosis: Dysphagia, unspecified (R13.10) Plan: All goals met       GO                Kathy Rosales 09/29/2016, 9:27 AM  Kathy Rosales, M.A. CCC-SLP [phone]

## 2016-09-29 NOTE — Progress Notes (Signed)
Pt being discharged home via wheelchair with family. Pt alert and oriented x4. VSS. Pt c/o no pain at this time. No signs of respiratory distress. Education complete and care plans resolved. IVx3 removed with catheter intact and pt tolerated well. No further issues at this time. Pt to follow up with PCP. Leanne Chang, RN

## 2016-09-29 NOTE — Discharge Instructions (Signed)
° ° °  Supplemental Discharge Instructions for  Pacemaker/Defibrillator Patients  Activity No heavy lifting or vigorous activity with your left arm for 6 to 8 weeks.  Do not raise your left arm above your head for one week.  Gradually raise your affected arm as drawn below.   NO DRIVING for 1 week  ; you may begin driving on   12/03/40 .  WOUND CARE - Keep the wound area clean and dry.  Do not get this area wet, no showers for one week; you may shower on  10/04/16   . - The tape/steri-strips on your wound will fall off; do not pull them off.  No bandage is needed on the site.  DO  NOT apply any creams, oils, or ointments to the wound area. - If you notice any drainage or discharge from the wound, any swelling or bruising at the site, or you develop a fever > 101? F after you are discharged home, call the office at once.  Special Instructions - You are still able to use cellular telephones; use the ear opposite the side where you have your pacemaker/defibrillator.  Avoid carrying your cellular phone near your device. - When traveling through airports, show security personnel your identification card to avoid being screened in the metal detectors.  Ask the security personnel to use the hand wand. - Avoid arc welding equipment, MRI testing (magnetic resonance imaging), TENS units (transcutaneous nerve stimulators).  Call the office for questions about other devices. - Avoid electrical appliances that are in poor condition or are not properly grounded. - Microwave ovens are safe to be near or to operate.  Additional information for defibrillator patients should your device go off: - If your device goes off ONCE and you feel fine afterward, notify the device clinic nurses. - If your device goes off ONCE and you do not feel well afterward, call 911. - If your device goes off TWICE, call 911. - If your device goes off THREE times in one day, call 911.  DO NOT DRIVE YOURSELF OR A FAMILY MEMBER WITH A  DEFIBRILLATOR TO THE HOSPITAL--CALL 911.

## 2016-10-02 ENCOUNTER — Other Ambulatory Visit (HOSPITAL_COMMUNITY): Payer: Self-pay

## 2016-10-02 NOTE — Progress Notes (Signed)
Paramedicine Encounter    Patient ID: Kathy Rosales, female    DOB: [birth year], 41 y.o.   MRN: [id number]   Patient Care Team: Ladell Pier, MD as PCP - General (Internal Medicine) Brunetta Genera, MD as Consulting Physician (Hematology and Oncology) Martinique, Betty G, MD (Family Medicine)  Patient Active Problem List   Diagnosis Date Noted  . Left-sided weakness   . Unresponsive   . Hypokalemia 08/03/2016  . Chronic systolic heart failure ([place]) 05/22/2016  . Mitral regurgitation 05/22/2016  . Near syncope 05/22/2016  . Delirium due to multiple etiologies, persistent, mixed level of activity 12/13/2015  . MDD (major depressive disorder), single episode, moderate ([place]) 12/13/2015  . Opioid use disorder, mild, abuse 12/13/2015  . UTI (urinary tract infection) 12/13/2015  . Fatigue 12/01/2015  . Orthostatic hypotension 12/01/2015  . Helicobacter positive gastritis 08/06/2015  . Gastric ulcer 08/06/2015  . Iron deficiency anemia due to chronic blood loss 08/06/2015  . Anemia 07/21/2015  . Iron deficiency anemia 10/22/2014  . B12 deficiency 10/22/2014  . Symptomatic anemia 08/28/2014  . Headache 08/28/2014  . Chest pain 08/28/2014  . 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  aspirin EC 81 MG tablet, Take 1 tablet (81 mg total) by mouth daily., Disp: 90 tablet, Rfl: 3 .  carvedilol (COREG) 6.25 MG tablet, Take 1 tablet (6.25 mg total) by mouth 2 (two) times daily., Disp: 60 tablet, Rfl: 3 .  digoxin (LANOXIN) 0.125 MG tablet, Take 1 tablet (0.125 mg total) by mouth daily., Disp: 90 tablet, Rfl: 3 .  furosemide (LASIX) 20 MG tablet, Take 2 tablets (40 mg total) by mouth daily., Disp: 180 tablet, Rfl: 3 .  losartan (COZAAR) 25 MG tablet, Take 0.5 tablets (12.5 mg total) by mouth at bedtime., Disp: 15 tablet, Rfl: 5 .  PARoxetine (PAXIL) 30 MG tablet, Take 1 tablet (30 mg total) by mouth daily., Disp: 30 tablet, Rfl: 3 .   potassium chloride 20 MEQ TBCR, Take 20 mEq by mouth daily., Disp: 90 tablet, Rfl: 3 .  spironolactone (ALDACTONE) 25 MG tablet, Take 0.5 tablets (12.5 mg total) by mouth daily., Disp: 15 tablet, Rfl: 3 .  traZODone (DESYREL) 100 MG tablet, Take 200 mg by mouth at bedtime. , Disp: , Rfl:  .  albuterol (PROVENTIL HFA;VENTOLIN HFA) 108 (90 Base) MCG/ACT inhaler, Inhale 2 puffs into the lungs every 6 (six) hours as needed for wheezing or shortness of breath. (Patient not taking: Reported on 10/02/2016), Disp: 1 Inhaler, Rfl: 2 .  Aspirin-Salicylamide-Caffeine (BC HEADACHE PO), Take 1 packet by mouth 4 (four) times daily as needed (headaches)., Disp: , Rfl:  .  diphenhydrAMINE (BENADRYL) 25 MG tablet, Take 50 mg by mouth 2 (two) times daily., Disp: , Rfl:  .  ketorolac (TORADOL) 10 MG tablet, Take 1 tablet (10 mg total) by mouth every 8 (eight) hours as needed for moderate pain. (Patient not taking: Reported on 10/02/2016), Disp: 9 tablet, Rfl: 0 .  riboflavin (VITAMIN B-2) 100 MG TABS tablet, Take 200 mg by mouth daily., Disp: , Rfl:  Allergies  Allergen Reactions  . Tramadol Itching and Other (See Comments)    Red spots all over body and itching  . Leflunomide Rash     Social History   Social History  . Marital status: Married    Spouse name: N/A  . Number of children: 1  . Years of education: N/A   Occupational History  . unemployeed  Social History Main Topics  . Smoking status: Former Research scientist (life sciences)  . Smokeless tobacco: Never Used  . Alcohol use No  . Drug use: No  . Sexual activity: No   Other Topics Concern  . Not on file   Social History Narrative   ** Merged History Encounter **        Physical Exam  Constitutional: She is oriented to person, place, and time. She appears well-developed.  Neck: Normal range of motion.  Cardiovascular: Normal rate and regular rhythm.   Pulmonary/Chest: Effort normal and breath sounds normal. No respiratory distress. She has no wheezes. She has  no rales.  Abdominal: Soft. She exhibits no distension. There is no tenderness.  Musculoskeletal: Normal range of motion. She exhibits no edema.  Neurological: She is alert and oriented to person, place, and time.  Skin: Skin is warm and dry.  Psychiatric: She has a normal mood and affect.        Future Appointments Date Time Provider [place]  10/05/2016 3:30 PM [facility] LAB [facility] None  10/12/2016 11:00 AM [facility] 1 [facility]  11/06/2016 3:00 PM [facility] PA/NP [facility] None  12/26/2016 1:45 PM Evans Lance, MD [facility]   BP 90/60 (BP Location: Left Arm, Patient Position: Supine, Cuff Size: Normal)   Pulse 72   Resp 16   Wt 131 lb (59.4 kg)   SpO2 95%   BMI 23.21 kg/m   Weight yesterday- 127 lbs Last visit weight- 130 lbs  Kathy Rosales was seen at home today for the first time since having her defibrillator placed lat week. She had thus far been unsuccessful in connecting her defibrillator communication device, so that was the first thing we accomplished. She reports feeling very sore from the surgery and is now out of her prescribed pain medication. She has requested that I speak to the clinic about possible getting more pain medication for her. Her Medications were verified and her pillbox was refilled. She did not take the night dose of carvedilol because she had it confused with corlanor, which was discontinued last week per Dr Aundra Dubin. She had a 4 pound weight gain over night but was not displaying any edema, SOB or JVD. I will contact the clinic in the morning to find out if they want anything done for this. Surgical sites do not exhibit calor or rubor upon assessment.  Jacquiline Doe, EMT 10/02/16  ACTION: Home visit completed Next visit planned for 1 week

## 2016-10-05 ENCOUNTER — Ambulatory Visit (INDEPENDENT_AMBULATORY_CARE_PROVIDER_SITE_OTHER): Payer: 59 | Admitting: *Deleted

## 2016-10-05 ENCOUNTER — Ambulatory Visit (HOSPITAL_COMMUNITY)
Admission: RE | Admit: 2016-10-05 | Discharge: 2016-10-05 | Disposition: A | Discharge status: Home / Self Care | Payer: 59 | Source: Ambulatory Visit | Attending: Internal Medicine | Admitting: Internal Medicine

## 2016-10-05 DIAGNOSIS — I5022 Chronic systolic (congestive) heart failure: Secondary | ICD-10-CM

## 2016-10-05 LAB — BASIC METABOLIC PANEL
Anion gap: 8 (ref 5–15)
BUN: 15 mg/dL (ref 6–20)
CO2: 23 mmol/L (ref 22–32)
Calcium: 8.8 mg/dL — ABNORMAL LOW (ref 8.9–10.3)
Chloride: 105 mmol/L (ref 101–111)
Creatinine, Ser: 0.81 mg/dL (ref 0.44–1.00)
GFR calc Af Amer: >60 mL/min (ref >60)
GFR calc non Af Amer: >60 mL/min (ref >60)
Glucose, Bld: 78 mg/dL (ref 65–99)
Potassium: 4.3 mmol/L (ref 3.5–5.1)
Sodium: 136 mmol/L (ref 135–145)

## 2016-10-05 LAB — BRAIN NATRIURETIC PEPTIDE: B Natriuretic Peptide: 83.5 pg/mL (ref 0.0–100.0)

## 2016-10-05 LAB — DIGOXIN LEVEL: Digoxin Level: 0.3 ng/mL — ABNORMAL LOW (ref 0.8–2.0)

## 2016-10-05 NOTE — Patient Instructions (Addendum)
Stop Digoxin for 2 days if itching stops DO NOT RESTART. If itching continues STOP Carvedilol for 2 days and RESTART Digoxin

## 2016-10-09 ENCOUNTER — Other Ambulatory Visit (HOSPITAL_COMMUNITY): Payer: Self-pay

## 2016-10-09 LAB — CUP PACEART INCLINIC DEVICE CHECK
Date Time Interrogation Session: 20180709074928
Implantable Lead Implant Date: 20180627
Implantable Lead Location: 753862
Implantable Lead Model: 3401
Implantable Lead Serial Number: 113145
Implantable Pulse Generator Implant Date: 20180627
Pulse Gen Serial Number: 222625

## 2016-10-09 NOTE — Progress Notes (Signed)
Paramedicine Encounter    Patient ID: Kathy Rosales, female    DOB: [birth year], 41 y.o.   MRN: [id number]   Patient Care Team: Ladell Pier, MD as PCP - General (Internal Medicine) Brunetta Genera, MD as Consulting Physician (Hematology and Oncology) Martinique, Betty G, MD (Family Medicine)  Patient Active Problem List   Diagnosis Date Noted  . Left-sided weakness   . Unresponsive   . Hypokalemia 08/03/2016  . Chronic systolic heart failure ([place]) 05/22/2016  . Mitral regurgitation 05/22/2016  . Near syncope 05/22/2016  . Delirium due to multiple etiologies, persistent, mixed level of activity 12/13/2015  . MDD (major depressive disorder), single episode, moderate ([place]) 12/13/2015  . Opioid use disorder, mild, abuse 12/13/2015  . UTI (urinary tract infection) 12/13/2015  . Fatigue 12/01/2015  . Orthostatic hypotension 12/01/2015  . Helicobacter positive gastritis 08/06/2015  . Gastric ulcer 08/06/2015  . Iron deficiency anemia due to chronic blood loss 08/06/2015  . Anemia 07/21/2015  . Iron deficiency anemia 10/22/2014  . B12 deficiency 10/22/2014  . Symptomatic anemia 08/28/2014  . Headache 08/28/2014  . Chest pain 08/28/2014  . 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  aspirin EC 81 MG tablet, Take 1 tablet (81 mg total) by mouth daily., Disp: 90 tablet, Rfl: 3 .  carvedilol (COREG) 6.25 MG tablet, Take 1 tablet (6.25 mg total) by mouth 2 (two) times daily., Disp: 60 tablet, Rfl: 3 .  digoxin (LANOXIN) 0.125 MG tablet, Take 1 tablet (0.125 mg total) by mouth daily., Disp: 90 tablet, Rfl: 3 .  furosemide (LASIX) 20 MG tablet, Take 2 tablets (40 mg total) by mouth daily., Disp: 180 tablet, Rfl: 3 .  losartan (COZAAR) 25 MG tablet, Take 0.5 tablets (12.5 mg total) by mouth at bedtime., Disp: 15 tablet, Rfl: 5 .  PARoxetine (PAXIL) 30 MG tablet, Take 1 tablet (30 mg total) by mouth daily., Disp: 30 tablet, Rfl: 3 .   potassium chloride 20 MEQ TBCR, Take 20 mEq by mouth daily., Disp: 90 tablet, Rfl: 3 .  riboflavin (VITAMIN B-2) 100 MG TABS tablet, Take 200 mg by mouth daily., Disp: , Rfl:  .  spironolactone (ALDACTONE) 25 MG tablet, Take 0.5 tablets (12.5 mg total) by mouth daily., Disp: 15 tablet, Rfl: 3 .  traZODone (DESYREL) 100 MG tablet, Take 200 mg by mouth at bedtime. , Disp: , Rfl:  .  albuterol (PROVENTIL HFA;VENTOLIN HFA) 108 (90 Base) MCG/ACT inhaler, Inhale 2 puffs into the lungs every 6 (six) hours as needed for wheezing or shortness of breath. (Patient not taking: Reported on 10/02/2016), Disp: 1 Inhaler, Rfl: 2 .  Aspirin-Salicylamide-Caffeine (BC HEADACHE PO), Take 1 packet by mouth 4 (four) times daily as needed (headaches)., Disp: , Rfl:  .  diphenhydrAMINE (BENADRYL) 25 MG tablet, Take 50 mg by mouth 2 (two) times daily., Disp: , Rfl:  .  ketorolac (TORADOL) 10 MG tablet, Take 1 tablet (10 mg total) by mouth every 8 (eight) hours as needed for moderate pain. (Patient not taking: Reported on 10/02/2016), Disp: 9 tablet, Rfl: 0 Allergies  Allergen Reactions  . Tramadol Itching and Other (See Comments)    Red spots all over body and itching  . Leflunomide Rash     Social History   Social History  . Marital status: Married    Spouse name: N/A  . Number of children: 1  . Years of education: N/A   Occupational History  . unemployeed  Social History Main Topics  . Smoking status: Former Research scientist (life sciences)  . Smokeless tobacco: Never Used  . Alcohol use No  . Drug use: No  . Sexual activity: No   Other Topics Concern  . Not on file   Social History Narrative   ** Merged History Encounter **        Physical Exam  Constitutional: She is oriented to person, place, and time. She appears well-developed.  Neck: Normal range of motion.  Cardiovascular: Normal rate and regular rhythm.   Pulmonary/Chest: Effort normal and breath sounds normal. No respiratory distress. She has no wheezes. She has  no rales.  Abdominal: Soft. Bowel sounds are normal. She exhibits no distension. There is no tenderness. There is no rebound.  Musculoskeletal: Normal range of motion. She exhibits no edema.  Neurological: She is alert and oriented to person, place, and time.  Skin: Skin is warm and dry.  Psychiatric: She has a normal mood and affect.        Future Appointments Date Time Provider [place]  11/06/2016 3:00 PM [facility] PA/NP [facility] None   BP 98/68 (BP Location: Right Arm, Patient Position: Sitting, Cuff Size: Normal)   Pulse 83   Resp 16   Wt 130 lb (59 kg)   SpO2 98%   BMI 23.03 kg/m  Weight yesterday- 131 lbs Last visit weight- 131 lbs  Kathy Rosales was seen at home today and reports feeling well. She is still sore from surgery but says the pain is decreasing. She seems to be in good spirits following the surgery but is anxious to see if her "heart is getting better." I explained that the defibrillator was not to make her heart "better" and her medications and diet would be the biggest contributors to improving her heart health. She denied SOB, dizziness or headaches. Medications were verified and her pillbox was refilled.   Kathy Rosales, EMT 10/09/16  ACTION: Home visit completed Next visit planned for 1 week

## 2016-10-09 NOTE — Progress Notes (Signed)
Wound Subcutaneous ICD check in clinic. Steri-Strips removed. Wound well healed incision edges approximated. No redness or swelling. 0 untreated episodes;  0 treated episodes;  0 shocks delivered. Electrode impedance status okay. No programming changes. Remaining longevity to ERI 100%. Pt educated about wound care and shock plan. Pt also c/o of systemic itching since discharge~ per SK pt to stop Digoxin for 2 days if itching stops pt is not to restart. If itching continues pt is to stop Carvedilol for 2 days and restart Digoxin. Pt to call if she has questions or concerns regarding medications.

## 2016-10-12 ENCOUNTER — Ambulatory Visit: Payer: Self-pay

## 2016-10-17 ENCOUNTER — Ambulatory Visit: Payer: Self-pay | Admitting: Family Medicine

## 2016-10-18 ENCOUNTER — Ambulatory Visit: Payer: Self-pay

## 2016-10-20 ENCOUNTER — Other Ambulatory Visit (HOSPITAL_COMMUNITY): Payer: Self-pay

## 2016-10-20 NOTE — Progress Notes (Signed)
Paramedicine Encounter    Patient ID: Kathy Rosales, female    DOB: 09/16/1975, 41 y.o.   MRN: [id number]   Patient Care Team: Ladell Pier, MD as PCP - General (Internal Medicine) Brunetta Genera, MD as Consulting Physician (Hematology and Oncology) Martinique, Betty G, MD (Family Medicine)  Patient Active Problem List   Diagnosis Date Noted  . Left-sided weakness   . Unresponsive   . Hypokalemia 08/03/2016  . Chronic systolic heart failure ([place]) 05/22/2016  . Mitral regurgitation 05/22/2016  . Near syncope 05/22/2016  . Delirium due to multiple etiologies, persistent, mixed level of activity 12/13/2015  . MDD (major depressive disorder), single episode, moderate ([place]) 12/13/2015  . Opioid use disorder, mild, abuse 12/13/2015  . UTI (urinary tract infection) 12/13/2015  . Fatigue 12/01/2015  . Orthostatic hypotension 12/01/2015  . Helicobacter positive gastritis 08/06/2015  . Gastric ulcer 08/06/2015  . Iron deficiency anemia due to chronic blood loss 08/06/2015  . Anemia 07/21/2015  . Iron deficiency anemia 10/22/2014  . B12 deficiency 10/22/2014  . Symptomatic anemia 08/28/2014  . Headache 08/28/2014  . Chest pain 08/28/2014  . 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  albuterol (PROVENTIL HFA;VENTOLIN HFA) 108 (90 Base) MCG/ACT inhaler, Inhale 2 puffs into the lungs every 6 (six) hours as needed for wheezing or shortness of breath., Disp: 1 Inhaler, Rfl: 2 .  aspirin EC 81 MG tablet, Take 1 tablet (81 mg total) by mouth daily., Disp: 90 tablet, Rfl: 3 .  carvedilol (COREG) 6.25 MG tablet, Take 1 tablet (6.25 mg total) by mouth 2 (two) times daily., Disp: 60 tablet, Rfl: 3 .  digoxin (LANOXIN) 0.125 MG tablet, Take 1 tablet (0.125 mg total) by mouth daily., Disp: 90 tablet, Rfl: 3 .  furosemide (LASIX) 20 MG tablet, Take 2 tablets (40 mg total) by mouth daily., Disp: 180 tablet, Rfl: 3 .  losartan (COZAAR) 25 MG  tablet, Take 0.5 tablets (12.5 mg total) by mouth at bedtime., Disp: 15 tablet, Rfl: 5 .  PARoxetine (PAXIL) 30 MG tablet, Take 1 tablet (30 mg total) by mouth daily., Disp: 30 tablet, Rfl: 3 .  potassium chloride 20 MEQ TBCR, Take 20 mEq by mouth daily., Disp: 90 tablet, Rfl: 3 .  riboflavin (VITAMIN B-2) 100 MG TABS tablet, Take 200 mg by mouth daily., Disp: , Rfl:  .  spironolactone (ALDACTONE) 25 MG tablet, Take 0.5 tablets (12.5 mg total) by mouth daily., Disp: 15 tablet, Rfl: 3 .  traZODone (DESYREL) 100 MG tablet, Take 200 mg by mouth at bedtime. , Disp: , Rfl:  .  Aspirin-Salicylamide-Caffeine (BC HEADACHE PO), Take 1 packet by mouth 4 (four) times daily as needed (headaches)., Disp: , Rfl:  .  diphenhydrAMINE (BENADRYL) 25 MG tablet, Take 50 mg by mouth 2 (two) times daily., Disp: , Rfl:  .  ketorolac (TORADOL) 10 MG tablet, Take 1 tablet (10 mg total) by mouth every 8 (eight) hours as needed for moderate pain. (Patient not taking: Reported on 10/02/2016), Disp: 9 tablet, Rfl: 0 Allergies  Allergen Reactions  . Tramadol Itching and Other (See Comments)    Red spots all over body and itching  . Leflunomide Rash     Social History   Social History  . Marital status: Married    Spouse name: N/A  . Number of children: 1  . Years of education: N/A   Occupational History  . unemployeed    Social History Main Topics  .  Smoking status: Former Research scientist (life sciences)  . Smokeless tobacco: Never Used  . Alcohol use No  . Drug use: No  . Sexual activity: No   Other Topics Concern  . Not on file   Social History Narrative   ** Merged History Encounter **        Physical Exam  Constitutional: She is oriented to person, place, and time. She appears well-developed.  Neck: Normal range of motion.  Cardiovascular: Normal rate and regular rhythm.   Pulmonary/Chest: Effort normal and breath sounds normal. No respiratory distress. She has no wheezes. She has no rales.  Abdominal: Soft. Bowel sounds  are normal. She exhibits no distension. There is no tenderness.  Musculoskeletal: Normal range of motion. She exhibits no edema.  Neurological: She is alert and oriented to person, place, and time.  Skin: Skin is warm and dry.  Psychiatric: She has a normal mood and affect.        Future Appointments Date Time Provider [place]  11/06/2016 3:00 PM [facility] PA/NP [facility] None  01/02/2017 1:45 PM Evans Lance, MD [facility]   BP 100/62 (BP Location: Right Arm, Patient Position: Sitting, Cuff Size: Normal)   Pulse 72   Resp 16   Wt 130 lb (59 kg)   SpO2 98%   BMI 23.03 kg/m  Weight yesterday- 129 lbs Last visit weight- 130 lbs  Kathy Rosales was seen at home today and reports feeling well. She is having residual pain from her defibrillator implantation surgery and as a result has been more sedentary. She also reports feeling increased SOB with ADL's since her surgery. She denied having any other symptoms and stated that she was worried that she was not able to be more active. I explained that it will take time for her to heal fully and to not be discouraged. Medications were verified and pillbox was refilled.   Kathy Rosales, EMT 10/20/16  ACTION: Home visit completed Next visit planned for 1 week

## 2016-10-27 ENCOUNTER — Other Ambulatory Visit (HOSPITAL_COMMUNITY): Payer: Self-pay

## 2016-10-27 NOTE — Progress Notes (Signed)
Paramedicine Encounter    Patient ID: Kathy Rosales, female    DOB: 17-Jan-1976, 41 y.o.   MRN: [id number]   Patient Care Team: Ladell Pier, MD as PCP - General (Internal Medicine) Brunetta Genera, MD as Consulting Physician (Hematology and Oncology) [place]ique, Betty G, MD (Family Medicine)  Patient Active Problem List   Diagnosis Date Noted  . Left-sided weakness   . Unresponsive   . Hypokalemia 08/03/2016  . Chronic systolic heart failure ([place]) 05/22/2016  . Mitral regurgitation 05/22/2016  . Near syncope 05/22/2016  . Delirium due to multiple etiologies, persistent, mixed level of activity 12/13/2015  . MDD (major depressive disorder), single episode, moderate ([place]) 12/13/2015  . Opioid use disorder, mild, abuse 12/13/2015  . UTI (urinary tract infection) 12/13/2015  . Fatigue 12/01/2015  . Orthostatic hypotension 12/01/2015  . Helicobacter positive gastritis 08/06/2015  . Gastric ulcer 08/06/2015  . Iron deficiency anemia due to chronic blood loss 08/06/2015  . Anemia 07/21/2015  . Iron deficiency anemia 10/22/2014  . B12 deficiency 10/22/2014  . Symptomatic anemia 08/28/2014  . Headache 08/28/2014  . Chest pain 08/28/2014  . 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  albuterol (PROVENTIL HFA;VENTOLIN HFA) 108 (90 Base) MCG/ACT inhaler, Inhale 2 puffs into the lungs every 6 (six) hours as needed for wheezing or shortness of breath., Disp: 1 Inhaler, Rfl: 2 .  aspirin EC 81 MG tablet, Take 1 tablet (81 mg total) by mouth daily., Disp: 90 tablet, Rfl: 3 .  Aspirin-Salicylamide-Caffeine (BC HEADACHE PO), Take 1 packet by mouth 4 (four) times daily as needed (headaches)., Disp: , Rfl:  .  carvedilol (COREG) 6.25 MG tablet, Take 1 tablet (6.25 mg total) by mouth 2 (two) times daily., Disp: 60 tablet, Rfl: 3 .  digoxin (LANOXIN) 0.125 MG tablet, Take 1 tablet (0.125 mg total) by mouth daily., Disp: 90 tablet, Rfl: 3 .   furosemide (LASIX) 20 MG tablet, Take 2 tablets (40 mg total) by mouth daily., Disp: 180 tablet, Rfl: 3 .  losartan (COZAAR) 25 MG tablet, Take 0.5 tablets (12.5 mg total) by mouth at bedtime., Disp: 15 tablet, Rfl: 5 .  PARoxetine (PAXIL) 30 MG tablet, Take 1 tablet (30 mg total) by mouth daily., Disp: 30 tablet, Rfl: 3 .  potassium chloride 20 MEQ TBCR, Take 20 mEq by mouth daily., Disp: 90 tablet, Rfl: 3 .  riboflavin (VITAMIN B-2) 100 MG TABS tablet, Take 200 mg by mouth daily., Disp: , Rfl:  .  spironolactone (ALDACTONE) 25 MG tablet, Take 0.5 tablets (12.5 mg total) by mouth daily., Disp: 15 tablet, Rfl: 3 .  traZODone (DESYREL) 100 MG tablet, Take 200 mg by mouth at bedtime. , Disp: , Rfl:  .  diphenhydrAMINE (BENADRYL) 25 MG tablet, Take 50 mg by mouth 2 (two) times daily., Disp: , Rfl:  .  ketorolac (TORADOL) 10 MG tablet, Take 1 tablet (10 mg total) by mouth every 8 (eight) hours as needed for moderate pain. (Patient not taking: Reported on 10/02/2016), Disp: 9 tablet, Rfl: 0 Allergies  Allergen Reactions  . Tramadol Itching and Other (See Comments)    Red spots all over body and itching  . Leflunomide Rash     Social History   Social History  . Marital status: Married    Spouse name: N/A  . Number of children: 1  . Years of education: N/A   Occupational History  . unemployeed    Social History Main Topics  .  Smoking status: Former Research scientist (life sciences)  . Smokeless tobacco: Never Used  . Alcohol use No  . Drug use: No  . Sexual activity: No   Other Topics Concern  . Not on file   Social History Narrative   ** Merged History Encounter **        Physical Exam  Constitutional: She is oriented to person, place, and time.  Neck: Normal range of motion.  Cardiovascular: Normal rate and regular rhythm.   Pulmonary/Chest: Effort normal and breath sounds normal.  Abdominal: Soft. She exhibits no distension.  Musculoskeletal: Normal range of motion. She exhibits no edema.   Neurological: She is alert and oriented to person, place, and time.  Skin: Skin is warm and dry.        Future Appointments Date Time Provider [place]  11/06/2016 3:00 PM [facility] PA/NP [facility] None  01/02/2017 1:45 PM Evans Lance, MD [facility]   BP 104/70 (BP Location: Right Arm, Patient Position: Sitting, Cuff Size: Normal)   Pulse 87   Resp 16   Wt 132 lb (59.9 kg)   SpO2 98%   BMI 23.38 kg/m  Weight yesterday- 130 lbs Last visit weight- 130 lbs  Kathy Rosales was seen at home today and reports feeling well with the exception of lingering pain from her surgery. She denied having any direct injury to the area but says it it still very tender and hurts more when she moves. She thinks this is because she is not doing anything except laying around and if she could get back to her normal ADL's she would feel better faster. He incisions appear to have fully healed and there was not swelling of bruising noted to the area.I told her I felt returning to light house work was acceptable but she should follow her driving restrictions placed by the physician and that if she began having discomfort or SOB, she should stop what she is doing. She also complained of a dry cough for the past three nights. He lung sounds were clear and equal and she did not have any edema. She also said her hands feel swollen when she wakes up in the morning. She is currently on 40 mg of furosemide per day. I have changed this from 40 mg once daily to 20 mg BID to see if this helps her with swelling over night. I will pass this along to [facility], who will see Kathy Rosales next week.    Jacquiline Doe, EMT 10/27/16  ACTION: Home visit completed Next visit planned for 1 week

## 2016-11-06 ENCOUNTER — Ambulatory Visit (HOSPITAL_COMMUNITY)
Admission: RE | Admit: 2016-11-06 | Discharge: 2016-11-06 | Disposition: A | Discharge status: Home / Self Care | Payer: 59 | Source: Ambulatory Visit | Attending: Student | Admitting: Student

## 2016-11-06 ENCOUNTER — Encounter (HOSPITAL_COMMUNITY): Payer: Self-pay

## 2016-11-06 ENCOUNTER — Other Ambulatory Visit (HOSPITAL_COMMUNITY): Payer: Self-pay

## 2016-11-06 ENCOUNTER — Ambulatory Visit (HOSPITAL_COMMUNITY)
Admission: RE | Admit: 2016-11-06 | Discharge: 2016-11-06 | Disposition: A | Discharge status: Home / Self Care | Payer: 59 | Source: Ambulatory Visit | Attending: Internal Medicine | Admitting: Internal Medicine

## 2016-11-06 VITALS — BP 128/70 | HR 67 | Wt 138.8 lb

## 2016-11-06 DIAGNOSIS — I429 Cardiomyopathy, unspecified: Secondary | ICD-10-CM | POA: Diagnosis not present

## 2016-11-06 DIAGNOSIS — Z87891 Personal history of nicotine dependence: Secondary | ICD-10-CM | POA: Insufficient documentation

## 2016-11-06 DIAGNOSIS — I5022 Chronic systolic (congestive) heart failure: Secondary | ICD-10-CM | POA: Insufficient documentation

## 2016-11-06 DIAGNOSIS — Z7982 Long term (current) use of aspirin: Secondary | ICD-10-CM | POA: Insufficient documentation

## 2016-11-06 DIAGNOSIS — I340 Nonrheumatic mitral (valve) insufficiency: Secondary | ICD-10-CM | POA: Diagnosis not present

## 2016-11-06 DIAGNOSIS — M4854XS Collapsed vertebra, not elsewhere classified, thoracic region, sequela of fracture: Secondary | ICD-10-CM | POA: Insufficient documentation

## 2016-11-06 DIAGNOSIS — F445 Conversion disorder with seizures or convulsions: Secondary | ICD-10-CM | POA: Insufficient documentation

## 2016-11-06 DIAGNOSIS — R079 Chest pain, unspecified: Secondary | ICD-10-CM

## 2016-11-06 LAB — BASIC METABOLIC PANEL
Anion gap: 6 (ref 5–15)
BUN: 10 mg/dL (ref 6–20)
CO2: 26 mmol/L (ref 22–32)
Calcium: 8.6 mg/dL — ABNORMAL LOW (ref 8.9–10.3)
Chloride: 107 mmol/L (ref 101–111)
Creatinine, Ser: 0.65 mg/dL (ref 0.44–1.00)
GFR calc Af Amer: >60 mL/min (ref >60)
GFR calc non Af Amer: >60 mL/min (ref >60)
Glucose, Bld: 95 mg/dL (ref 65–99)
Potassium: 3.7 mmol/L (ref 3.5–5.1)
Sodium: 139 mmol/L (ref 135–145)

## 2016-11-06 MED ORDER — SPIRONOLACTONE 25 MG PO TABS
25.0000 mg | ORAL_TABLET | Freq: Every day | ORAL | 5 refills | Status: DC
Start: 2016-11-06 — End: 2017-01-02

## 2016-11-06 NOTE — Progress Notes (Signed)
PCP: Dr. Janne Napoleon Primary Cardiologist: Dr. Stanford Breed  HF MD: Dr Aundra Dubin     Advanced Heart Failure Clinic Note   HPI:  Kathy Rosales is a 41 year old female with a past medical history of NICM EF 25-30% (cath in 05/2016 with normal cors), moderate mitral regurgitation, and tricuspid regurgitation. No FH of coronary disease.   Diagnosed with CHF and  went to her PCP in January 2018 for dyspnea. Says she started getting SOB in the fall. Denies any type of viral illness. No family history of heart disease. Echo was performed and showed reduced EF 20-25% with mod-severe MR. at 25-30%. She was referred to Dr. Stanford Breed who then set her up for a left and right heart cath. Admitted over night on 2/19 after cath due to changes in LOC after cath, was hypotensive. Symptoms resolved, head CT negative. Optimization of her medications was prohibited by hypotension prior to discharge. Started on corlanor.   Had CPX 07/15/16. Results as below. Immediately after, pt sat in chair and then had a pseudoseizure. No tonic/clonic activity. Pt taken to ED with Neurology consulted. Pt recovered spontaneously recovered. CT angio chest and CT head  unremarkable.  Repeat echo was done in 4/18.  EF remains 20-25% with moderate diastolic dysfunction.   She is s/p SubQ ICD implantation 09/27/16. Admission complicated by AMS thought to be psychogenic. CT head, CTA head/neck and EEG were all negative.   She presents today for regular follow up. Spanish interpreter present. Hasn't been feeling very good. She still has a chest pain below her left breast that is constant. She feels like she had a shock last night, and one last month. She describes it as a strong pain. Upon clarification, she states she does NOT feel like it is from the defibrillator, but is a pain in the middle of chest. Can walk from exam room to bathroom without SOB. Mild SOB changing clothes or bathing. Denies lightheadedness or dizziness. Having more headaches lately.     ECG (5/18, personally reviewed): NSR, normal QRS duration, nonspecific T wave flattening  Labs (6/18): K 3.5, creatinine 0.71, hgb 13.1  CPX 07/18/16 Pre-Exercise PFTs  FVC 2.79 (92%)    FEV1 2.38 (93%)     FEV1/FVC 85 (101%)     MVV 48 (49%) Exercise Time:  3:15  Speed (mph): 1.5   Grade (%): 0   RPE: 17 Reason stopped: Patient ended test due to Dyspnea (8/10) Additional symptoms: lightheaded (9/10), chest pressure (9/10) Resting HR: 84 Peak HR: 95  (53% age predicted max HR) BP rest: 92/62 BP peak: 88/60 Peak VO2: 14.1 (44% predicted peak VO2) VE/VCO2 slope: 59.4 OUES: 0.73 Peak RER: 0.96 Ventilatory Threshold: Not achieved  Peak RR 77 Peak Ventilation: 28.8 VE/MVV: 60% PETCO2 at peak: 28 O2pulse: 8  80% predicted O2pulse)  RHC/LHC 2/19/018  RA 3 PCWP  20 CO/CI 4.43/2.88 LHC normal cors.   12/10/2015 TSH 2.7   ECHO 04/2016 EF 20-25%. Mod-Severe MR, Mod TR.  ECHO 4/18 EF 202-5%, moderate diastolic dysfunction, PASP 31 mmHg.   SH: Lives with Mom. No alcohol or drug abuse. Does not smoke cigarettes  FH: No history of CAD.   Review of systems complete and found to be negative unless listed in HPI.    SH:  Social History   Social History  . Marital status: Married    Spouse name: N/A  . Number of children: 1  . Years of education: N/A   Occupational History  . unemployeed  Social History Main Topics  . Smoking status: Former Research scientist (life sciences)  . Smokeless tobacco: Never Used  . Alcohol use No  . Drug use: No  . Sexual activity: No   Other Topics Concern  . Not on file   Social History Narrative   ** Merged History Encounter **        FH:  Family History  Problem Relation Age of Onset  . Cancer Paternal Grandfather   . Mental illness Neg Hx     Past Medical History:  Diagnosis Date  . Acute systolic CHF (congestive heart failure) ([place]) 05/22/2016  . Blood transfusion without reported diagnosis   . Still's disease [facility])      Current Outpatient Prescriptions  Medication Sig Dispense Refill  . aspirin EC 81 MG tablet Take 1 tablet (81 mg total) by mouth daily. 90 tablet 3  . carvedilol (COREG) 6.25 MG tablet Take 1 tablet (6.25 mg total) by mouth 2 (two) times daily. 60 tablet 3  . furosemide (LASIX) 20 MG tablet Take 2 tablets (40 mg total) by mouth daily. 180 tablet 3  . PARoxetine (PAXIL) 30 MG tablet Take 1 tablet (30 mg total) by mouth daily. 30 tablet 3  . potassium chloride 20 MEQ TBCR Take 20 mEq by mouth daily. 90 tablet 3  . riboflavin (VITAMIN B-2) 100 MG TABS tablet Take 200 mg by mouth daily.    Marland Kitchen spironolactone (ALDACTONE) 25 MG tablet Take 0.5 tablets (12.5 mg total) by mouth daily. 15 tablet 3  . traZODone (DESYREL) 100 MG tablet Take 200 mg by mouth at bedtime.     Marland Kitchen albuterol (PROVENTIL HFA;VENTOLIN HFA) 108 (90 Base) MCG/ACT inhaler Inhale 2 puffs into the lungs every 6 (six) hours as needed for wheezing or shortness of breath. (Patient not taking: Reported on 11/06/2016) 1 Inhaler 2  . Aspirin-Salicylamide-Caffeine (BC HEADACHE PO) Take 1 packet by mouth 4 (four) times daily as needed (headaches).    . digoxin (LANOXIN) 0.125 MG tablet Take 1 tablet (0.125 mg total) by mouth daily. (Patient not taking: Reported on 11/06/2016) 90 tablet 3  . diphenhydrAMINE (BENADRYL) 25 MG tablet Take 50 mg by mouth 2 (two) times daily.    Marland Kitchen ketorolac (TORADOL) 10 MG tablet Take 1 tablet (10 mg total) by mouth every 8 (eight) hours as needed for moderate pain. (Patient not taking: Reported on 10/02/2016) 9 tablet 0  . losartan (COZAAR) 25 MG tablet Take 0.5 tablets (12.5 mg total) by mouth at bedtime. (Patient not taking: Reported on 11/06/2016) 15 tablet 5   No current facility-administered medications for this encounter.     Vitals:   11/06/16 1523  BP: 128/70  Pulse: 67  SpO2: 98%  Weight: 138 lb 12.8 oz (63 kg)   Wt Readings from Last 3 Encounters:  11/06/16 138 lb 12.8 oz (63 kg)  10/27/16 132 lb (59.9  kg)  10/20/16 130 lb (59 kg)    PHYSICAL EXAM: Interpreter present  General: Well appearing. No resp difficulty. HEENT: Normal Neck: Supple. JVP 5-6. Carotids 2+ bilat; no bruits. No thyromegaly or nodule noted. Cor: PMI nondisplaced. RRR, No M/G/R noted. Tender at SubQ ICD site L side. Lungs: CTAB, normal effort. Abdomen: Soft, non-tender, non-distended, no HSM. No bruits or masses. +BS  Extremities: No cyanosis, clubbing, rash, R and LLE no edema.  Neuro: Alert & orientedx3, cranial nerves grossly intact. moves all 4 extremities w/o difficulty. Affect pleasant   ASSESSMENT & PLAN: 1. Chronic Systolic Heart Failure: Nonischemic cardiomyopathy, ?viral  cardiomyopathy. LHC 05/2016 no coronary disease. Echo 05/2016 EF 20-25% mod MR, repeat echo 07/21/16 LVEF 20-25%.   - NYHA class II symptoms. - Volume status stable on exam.  - Continue Lasix 40 mg. OK to take extra 20 mg of lasix as needed. - Continue Coreg 6.25 mg bid.  - Continue losartan 12.5 mg at bedtime.   - Increase spironolactone to 25 mg qhs. - Continue digoxin. Level fine at last visit.  - CPX limited by submaximal effort and pseudoseizure at end of test, but showed severe HF limitation. Unclear how valid test results were.  - Narrow QRS, not CRT candidate.  She has seen EP, plan for subQ ICD.  2. Mitral Regurgitation: Mild by most recent echo 07/21/16  3. Atypical chest pain - Not reproducible or related to exertion - ? Related to her sub Q ICD. NO shocks. Will check CXR to make sure placement of leads OK.   Meds as above. Labs today. CXR. Will have follow up with EP for ICD pain.   Shirley Friar, PA-C  11/06/2016 3:30 PM  Greater than 50% of the 25 minute visit was spent in counseling/coordination of care regarding disease state education, salt/fluid restriction, alarm symptoms, and medication reconciliation.

## 2016-11-06 NOTE — Progress Notes (Signed)
Paramedicine Encounter   Patient ID: Kathy Rosales , female,   DOB: 1975/10/19,40 y.o.,  MRN: [id number]   Met patient in clinic today with Jonni Sanger, [place]. [name] stated she has had a sharp pain in her chest on two occasions since having her pacemaker placed. She stated the pain is sharp in nature and only lasts a brief moment. Jonni Sanger had her device checked to see if these pains are from a defibrillation but it revealed that no defibrillations had occured. He is also having a chest x-ray and performed to assess the area surrounding the device as well as contacting Dr Lovena Le about the pain she has been having around the surgery site. Spironolactone is being increased to 25 mg/day.     Time spent with patient: 30 minutes  Jacquiline Doe, EMT 11/06/2016   ACTION: Next visit planned for 1 week

## 2016-11-06 NOTE — Patient Instructions (Signed)
Chest xray today to evaluate chest pain.  INCREASE Spironolactone to 25 mg (1 whole tablet) once daily.  Routine lab work today. Will notify you of abnormal results, otherwise no news is good news!  Follow up with Dr. Lovena Le for your incisional chest pain. Address: [address] Floor), [place], [place] [address]  Phone: [phone]  Follow up 6-8 weeks with Oda Kilts PA-C. Take all medication as prescribed the day of your appointment. Bring all medications with you to your appointment.  Do the following things EVERYDAY: 1) Weigh yourself in the morning before breakfast. Write it down and keep it in a log. 2) Take your medicines as prescribed 3) Eat low salt foods-Limit salt (sodium) to 2000 mg per day.  4) Stay as active as you can everyday 5) Limit all fluids for the day to less than 2 liters

## 2016-11-07 NOTE — Progress Notes (Signed)
Cardiology Office Note Date:  11/10/2016  Patient ID:  Kathy Rosales, DOB June 27, 1975, MRN [id number] PCP:  Ladell Pier, MD  Cardiologist:  Dr. Aundra Dubin Electrophysiologist: Dr. Lovena Le    Chief Complaint: pain at icd site  History of Present Illness: Kathy Rosales is a 41 y.o. female with history of NICM, she was admitted for outpatient implantation of a S-ICD in June. She developed in post procedure area AMS/became unresponsive, neurology consult and w/u was undertaken.  The patient had very inconsistent symptoms and observations through the day/evening, CT head, CTA head/neck and EEG were all negative, her symptoms did eventually completely resolve and neurology signed off suspected to have been slow response to anesthesia or psychogenic.  She had hx of a having a CPX 07/15/16. Results as below. Immediately after, pt sat in chair and then had a pseudoseizure. No tonic/clonic activity. Pt taken to ED with Neurology consulted. Pt recovered spontaneously recovered. CT angio chest and CT head  unremarkable.   She was seen post-op for wound visit, with well healed incisions, c/o pain at site and was evaluated by RN and Dr. Caryl Comes with stable sites, also mentions of itching instructed to hold digoxin fir 2 days and if resolved to stay off.     She saw CHF, A, [place] Monday, generally still not feeling well, no changes were made to he tx, c/p pain at her site and recommended to f/u ere, CXR was ordered to evaluate lead placement  The patient all-in-all is feeling well.  No CP, palpitations or SOB, no DOE, no dizziness, near syncope or syncope.  She remains with pain at her S-ICD site, worse when raising her arm up are twisting movement, radiates under breast and along her sternum.  She has not had fever, chills or signs of illness.   Device information: BSCi S-ICD implanted 09/28/16, Dr. Lovena Le, NICM Post-op AMS, lateralizing symptoms, negative neuro w/u with  spontaneous recovery, suspect to be psychogenic  Past Medical History:  Diagnosis Date  . Acute systolic CHF (congestive heart failure) ([place]) 05/22/2016  . Blood transfusion without reported diagnosis   . 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 COLONOSCOPY WITH PROPOFOL N/A 07/23/2015   Procedure: COLONOSCOPY WITH PROPOFOL;  Surgeon: Ronald Lobo, MD;  Location: [facility];  Service: Endoscopy;  Laterality: N/A;  . ESOPHAGOGASTRODUODENOSCOPY (EGD) WITH PROPOFOL N/A 07/23/2015   Procedure: ESOPHAGOGASTRODUODENOSCOPY (EGD) WITH PROPOFOL;  Surgeon: Ronald Lobo, MD;  Location: [facility];  Service: Endoscopy;  Laterality: N/A;  . ESOPHAGOGASTRODUODENOSCOPY (EGD) WITH PROPOFOL N/A 11/18/2015   Procedure: ESOPHAGOGASTRODUODENOSCOPY (EGD) WITH PROPOFOL;  Surgeon: Ronald Lobo, MD;  Location: [facility];  Service: Endoscopy;  Laterality: N/A;  . RIGHT/LEFT HEART CATH AND CORONARY ANGIOGRAPHY N/A 05/22/2016   Procedure: Right/Left Heart Cath and Coronary Angiography;  Surgeon: Peter M Martinique, MD;  Location: [place] CV LAB;  Service: Cardiovascular;  Laterality: N/A;  . SUBQ ICD IMPLANT N/A 09/27/2016   Procedure: SubQ ICD Implant;  Surgeon: Evans Lance, MD;  Location: [place] CV LAB;  Service: Cardiovascular;  Laterality: N/A;    Current Outpatient Prescriptions  Medication Sig Dispense Refill  . aspirin EC 81 MG tablet Take 1 tablet (81 mg total) by mouth daily. 90 tablet 3  . Aspirin-Salicylamide-Caffeine (BC HEADACHE PO) Take 1 packet by mouth 4 (four) times daily as needed (headaches).    . carvedilol (COREG) 6.25 MG tablet Take 1 tablet (6.25 mg total) by  mouth 2 (two) times daily. 60 tablet 3  . digoxin (LANOXIN) 0.125 MG tablet Take 1 tablet (0.125 mg total) by mouth daily. 90 tablet 3  . furosemide (LASIX) 20 MG tablet Take 2 tablets (40 mg total) by mouth daily. 180 tablet 3  . losartan (COZAAR) 25 MG tablet Take 0.5 tablets  (12.5 mg total) by mouth at bedtime. 15 tablet 5  . PARoxetine (PAXIL) 30 MG tablet Take 1 tablet (30 mg total) by mouth daily. 30 tablet 3  . potassium chloride 20 MEQ TBCR Take 20 mEq by mouth daily. 90 tablet 3  . riboflavin (VITAMIN B-2) 100 MG TABS tablet Take 200 mg by mouth daily.    Marland Kitchen spironolactone (ALDACTONE) 25 MG tablet Take 1 tablet (25 mg total) by mouth daily. 30 tablet 5  . traZODone (DESYREL) 100 MG tablet Take 200 mg by mouth at bedtime.      No current facility-administered medications for this visit.     Allergies:   Tramadol and Leflunomide   Social History:  The patient  reports that she has quit smoking. She has never used smokeless tobacco. She reports that she does not drink alcohol or use drugs.   Family History:  The patient's family history includes Cancer in her paternal grandfather.  ROS:  Please see the history of present illness.  All other systems are reviewed and otherwise negative.   PHYSICAL EXAM:  VS:  BP 110/62   Pulse 74   Ht 5\' 3"  (1.6 m)   Wt 131 lb 3.2 oz (59.5 kg)   LMP 10/30/2016   SpO2 97%   BMI 23.24 kg/m  BMI: Body mass index is 23.24 kg/m. Well nourished, well developed, in no acute distress  HEENT: normocephalic, atraumatic  Neck: no JVD, carotid bruits or masses Cardiac:   RRR; no significant murmurs, no rubs, or gallops Lungs:  CTA b/l , no wheezing, rhonchi or rales  Abd: soft, nontender MS: no deformity or atrophy Ext: no edema  Skin: warm and dry, no rash Neuro:  No gross deficits appreciated Psych: euthymic mood, full affect  S-ICD site is stable, no tethering or discomfort, no skin changes, no erythema, edema or fluid collection.  She is midly tender with palpation, chest incisions as well without skin changes/erythema, edema.  She has a stitch at the inferior edge of the superior site.  It is removed easily, no bleeding or drainage, is cleaned, triple antibiotic ointment and stertistrips applied.   EKG:  Done  Today  and reviewed by myself is SR, 74bpm, no ischemic changes, QRS 43ms, no ischemic changes ICD interrogation done today by industry and reviewed by myself: battery is stable, no shocks, electrode impedance is OK  CPX 07/18/16 Pre-Exercise PFTs  FVC 2.79 (92%)    FEV1 2.38 (93%)     FEV1/FVC 85 (101%)     MVV 48 (49%) Exercise Time:  3:15  Speed (mph): 1.5   Grade (%): 0   RPE: 17 Reason stopped: Patient ended test due to Dyspnea (8/10) Additional symptoms: lightheaded (9/10), chest pressure (9/10) Resting HR: 84 Peak HR: 95  (53% age predicted max HR) BP rest: 92/62 BP peak: 88/60 Peak VO2: 14.1 (44% predicted peak VO2) VE/VCO2 slope: 59.4 OUES: 0.73 Peak RER: 0.96 Ventilatory Threshold: Not achieved  Peak RR 77 Peak Ventilation: 28.8 VE/MVV: 60% PETCO2 at peak: 28 O2pulse: 8  80% predicted O2pulse)  RHC/LHC 2/19/018  RA 3 PCWP  20 CO/CI 4.43/2.88 LHC normal cors.   12/10/2015  TSH 2.7   ECHO 04/2016 EF 20-25%. Mod-Severe MR, Mod TR.  ECHO 4/18 EF 202-5%, moderate diastolic dysfunction, PASP 31 mmHg.   Recent Labs: 12/10/2015: TSH 2.756 07/17/2016: Magnesium 2.1 09/22/2016: Hemoglobin 13.1; Platelets 227 09/27/2016: ALT 17 10/05/2016: B Natriuretic Peptide 83.5 11/06/2016: BUN 10; Creatinine, Ser 0.65; Potassium 3.7; Sodium 139  12/14/2015: Cholesterol 149; HDL 40; LDL Cholesterol 73; Total CHOL/HDL Ratio 3.7; Triglycerides 180; VLDL 36   Estimated Creatinine Clearance: 77.3 mL/min (by C-G formula based on SCr of 0.65 mg/dL).   Wt Readings from Last 3 Encounters:  11/10/16 131 lb 3.2 oz (59.5 kg)  11/06/16 138 lb 12.8 oz (63 kg)  10/27/16 132 lb (59.9 kg)     Other studies reviewed: Additional studies/records reviewed today include: summarized above  ASSESSMENT AND PLAN:  1. NICM      Exam is euvolemic, no symptoms of fluid OL     She follows closely with CHF clinic     On BB/ARB, diuretic tx  2. S-ICD     Site exam findings as above     Dr.  Caryl Comes evaluated as well, No findings to suggest infection     Will get CBC and blood cx to r/o infection   She is instructed not to shower/get site wet where stitch was removed until device clinic visit next week for check on site that stitch was removed from.       Disposition: next week device clinic check and keep dr.Taylor visit in October as scheduled, sooner if needed.  Current medicines are reviewed at length with the patient today.  The patient did not have any concerns regarding medicines.  Haywood Lasso, PA-C 11/10/2016 9:29 AM     [address] [place] [address] [phone] (office)  [phone] (fax)

## 2016-11-09 ENCOUNTER — Telehealth (HOSPITAL_COMMUNITY): Payer: Self-pay

## 2016-11-09 NOTE — Telephone Encounter (Signed)
DG Chest 2 View  Order: [id number]  Status:  Final result Visible to patient:  No (Not Released) Dx:  Chest pain, unspecified type  Notes recorded by Effie Berkshire, RN on 11/09/2016 at 4:49 PM EDT Patient aware and reminded of apt tomorrow ------  Notes recorded by Shirley Friar, PA-C on 11/07/2016 at 11:09 AM EDT Please let her know that there are no acute abnormalities on her CXR. Will forward to [facility] as well who sees patient later this week.   Thanks!  Legrand Como [address]" [place], PA-C 11/07/2016 11:08 AM

## 2016-11-10 ENCOUNTER — Other Ambulatory Visit: Payer: Self-pay | Admitting: Internal Medicine

## 2016-11-10 ENCOUNTER — Ambulatory Visit (INDEPENDENT_AMBULATORY_CARE_PROVIDER_SITE_OTHER): Payer: 59 | Admitting: Physician Assistant

## 2016-11-10 ENCOUNTER — Emergency Department (HOSPITAL_COMMUNITY)
Admission: EM | Admit: 2016-11-10 | Discharge: 2016-11-11 | Disposition: A | Discharge status: Home / Self Care | Payer: 59 | Attending: Emergency Medicine | Admitting: Emergency Medicine

## 2016-11-10 ENCOUNTER — Encounter (HOSPITAL_COMMUNITY): Payer: Self-pay | Admitting: Emergency Medicine

## 2016-11-10 ENCOUNTER — Encounter: Payer: Self-pay | Admitting: Physician Assistant

## 2016-11-10 VITALS — BP 110/62 | HR 74 | Ht 63.0 in | Wt 131.2 lb

## 2016-11-10 DIAGNOSIS — Z5189 Encounter for other specified aftercare: Secondary | ICD-10-CM | POA: Diagnosis not present

## 2016-11-10 DIAGNOSIS — Z4801 Encounter for change or removal of surgical wound dressing: Secondary | ICD-10-CM | POA: Diagnosis not present

## 2016-11-10 DIAGNOSIS — I5022 Chronic systolic (congestive) heart failure: Secondary | ICD-10-CM

## 2016-11-10 DIAGNOSIS — I428 Other cardiomyopathies: Secondary | ICD-10-CM

## 2016-11-10 NOTE — ED Notes (Signed)
Patient updated on delays and wait time 

## 2016-11-10 NOTE — ED Triage Notes (Signed)
Patient requesting dressing change at upper chest incision with no drainage and no bleeding , sterile gauze dressing applied at triage .

## 2016-11-10 NOTE — Patient Instructions (Addendum)
Medication Instructions:   Your physician recommends that you continue on your current medications as directed. Please refer to the Current Medication list given to you today.'  If you need a refill on your cardiac medications before your next appointment, please call your pharmacy.  Labwork: CBC AND BLOOD CULTURES   Testing/Procedures: NONE ORDERED  TODAY    Follow-Up:  NEXT WEEK DEVICE CLINIC    Any Other Special Instructions Will Be Listed Below (If Applicable).

## 2016-11-11 NOTE — ED Notes (Signed)
Pt didn't want to wait for paperwork for a bandaid application.  Left without discharge papers

## 2016-11-11 NOTE — Discharge Instructions (Signed)
1. Medications: usual home medications 2. Treatment: rest, drink plenty of fluids, keep wound clean with warm soap and water, keep dressing dry 3. Follow Up: Please followup with your cardiologist as directed for discussion of your diagnoses and further evaluation after today's visit; if you do not have a primary care doctor use the resource guide provided to find one; Please return to the ER for signs of infection, fever or other concerns

## 2016-11-11 NOTE — ED Provider Notes (Signed)
[place] DEPT Provider Note   CSN: [id number] Arrival date & time: 11/10/16  2101     History   Chief Complaint Chief Complaint  Patient presents with  . Dressing Change    HPI Kathy Rosales is a 41 y.o. female with a hx of presents to the Emergency Department Requesting a dressing change for sternal incision where pacemaker was inserted recently.  Record review does not show record of this. She reports the stitches were removed 2 days ago but the bandage used to cover them was too loose and came off. She reports she was instructed to keep the incision covered for 1 full week.  Gauze and tape used in triage to cover wound however patient reports this is unacceptable because air can get to the surgical incision.  She denies redness, fevers, chills, drainage or pain at the site. No aggravating or alleviating factors.   The history is provided by the patient and medical records. No language interpreter was used.    History reviewed. No pertinent past medical history.  There are no active problems to display for this patient.   History reviewed. No pertinent surgical history.  OB History    No data available       Home Medications    Prior to Admission medications   Not on File    Family History No family history on file.  Social History Social History  Substance Use Topics  . Smoking status: Never Smoker  . Smokeless tobacco: Never Used  . Alcohol use No     Allergies   Patient has no known allergies.   Review of Systems Review of Systems  Constitutional: Negative for fever.  Gastrointestinal: Negative for nausea and vomiting.  Skin: Positive for wound.  Allergic/Immunologic: Negative for immunocompromised state.  Neurological: Negative for weakness and numbness.  Hematological: Does not bruise/bleed easily.  Psychiatric/Behavioral: The patient is not nervous/anxious.      Physical Exam Updated Vital Signs BP 108/78 (BP Location: Right Arm)    Pulse 62   Temp 97.9 F (36.6 C) (Oral)   Resp 18   SpO2 96%   Physical Exam  Constitutional: She is oriented to person, place, and time. She appears well-developed and well-nourished. No distress.  HENT:  Head: Normocephalic and atraumatic.  Eyes: Conjunctivae are normal. No scleral icterus.  Neck: Normal range of motion.  Cardiovascular: Normal rate, regular rhythm, normal heart sounds and intact distal pulses.   No murmur heard. Capillary refill < 3 sec  Pulmonary/Chest: Effort normal and breath sounds normal. No respiratory distress.  Well-healed midline surgical incision.  No erythema, induration or increased warmth. No dehiscence. No purulent drainage. No tenderness to palpation.  Musculoskeletal: Normal range of motion. She exhibits no edema.  Neurological: She is alert and oriented to person, place, and time.  Skin: Skin is warm and dry. She is not diaphoretic.  Psychiatric: She has a normal mood and affect.  Nursing note and vitals reviewed.    ED Treatments / Results   Procedures Procedures (including critical care time)  Medications Ordered in ED Medications - No data to display   Initial Impression / Assessment and Plan / ED Course  I have reviewed the triage vital signs and the nursing notes.  Pertinent labs & imaging results that were available during my care of the patient were reviewed by me and considered in my medical decision making (see chart for details).     Patient presents with concerns that her surgical incision is  uncovered. It is healing well without evidence of secondary infection. No dehiscence. Patient is unhappy with the placement of gauze therefore Tegaderm was placed by me. Discussed routine wound care. Patient states understanding and is in agreement with the plan.  Final Clinical Impressions(s) / ED Diagnoses   Final diagnoses:  Dressing change or removal, surgical wound    New Prescriptions New Prescriptions   No medications on  file     Agapito Games 11/11/16 Onset, April, MD 11/11/16 0206

## 2016-11-11 NOTE — ED Notes (Signed)
Pt standing in hall asking about papers

## 2016-11-11 NOTE — ED Notes (Signed)
Pt stable, ambulatory, states understanding of discharge instructions 

## 2016-11-14 LAB — CBC/DIFF AMBIGUOUS DEFAULT
Basophils Absolute: 0 10*3/uL (ref 0.0–0.2)
Basos: 1 %
EOS (ABSOLUTE): 0.1 10*3/uL (ref 0.0–0.4)
Eos: 3 %
Hematocrit: 42 % (ref 34.0–46.6)
Hemoglobin: 14.2 g/dL (ref 11.1–15.9)
Immature Grans (Abs): 0 10*3/uL (ref 0.0–0.1)
Immature Granulocytes: 0 %
Lymphocytes Absolute: 1.1 10*3/uL (ref 0.7–3.1)
Lymphs: 20 %
MCH: 30.9 pg (ref 26.6–33.0)
MCHC: 33.8 g/dL (ref 31.5–35.7)
MCV: 92 fL (ref 79–97)
Monocytes Absolute: 0.3 10*3/uL (ref 0.1–0.9)
Monocytes: 5 %
Neutrophils Absolute: 3.9 10*3/uL (ref 1.4–7.0)
Neutrophils: 71 %
Platelets: 223 10*3/uL (ref 150–379)
RBC: 4.59 x10E6/uL (ref 3.77–5.28)
RDW: 13.7 % (ref 12.3–15.4)
WBC: 5.5 10*3/uL (ref 3.4–10.8)

## 2016-11-14 LAB — CULTURE, BLOOD (SINGLE)

## 2016-11-15 ENCOUNTER — Telehealth: Payer: Self-pay | Admitting: *Deleted

## 2016-11-15 ENCOUNTER — Telehealth: Payer: Self-pay

## 2016-11-15 ENCOUNTER — Ambulatory Visit: Payer: 59 | Admitting: *Deleted

## 2016-11-15 DIAGNOSIS — I428 Other cardiomyopathies: Secondary | ICD-10-CM

## 2016-11-15 NOTE — Progress Notes (Signed)
Device incision site assessed, no redness, swelling or edema noted. Tegaderm removed from chest incision site, incision well healed, no redness or swelling noted. Pt denied fever or chills. Informed pt than an inquiry is being made as to the results of her blood cultures. See phone note.

## 2016-11-15 NOTE — Telephone Encounter (Signed)
Spoke with [employer] at [employer], labs were unavailable, number given to Results at [employer], spoke with Amy, Amy attempted to reach [employer] at [employer] but office was closed. Amy stated she was going to request a log of all lab draws from 11/10/2016, she stated she would call me back tomorrow. Pt informed of this and informed her I would call her back tomorrow with more information.

## 2016-11-15 NOTE — Telephone Encounter (Signed)
[place] in reference to pt showing up and getting lab work written per  [employer] by [employer].   [employer] rep took down number and stated will call back once they have located the information on the lab work.  Pt  Name  dob and date lab was suppose to be drawn 11-10-16

## 2016-11-20 ENCOUNTER — Telehealth: Payer: Self-pay | Admitting: *Deleted

## 2016-11-20 NOTE — Telephone Encounter (Signed)
-----   Message from Deboraha Sprang, MD sent at 11/18/2016 11:41 AM EDT ----- Please Inform Patient that labs are normal x   Thanks

## 2016-11-23 ENCOUNTER — Telehealth: Payer: Self-pay | Admitting: Internal Medicine

## 2016-11-23 NOTE — Telephone Encounter (Signed)
Kathy Rosales calling, states that he received a vm in regards to patient yesterday. I am not sure who called.

## 2016-11-23 NOTE — Telephone Encounter (Signed)
Kathy Rosales doesn't know who called or why.  Just said this was the number left for him to call.  Advised I have not been able to determine who called and do not seen anything pt who need to be notified of.  Kathy Rosales states understanding.

## 2016-11-30 ENCOUNTER — Other Ambulatory Visit (HOSPITAL_COMMUNITY): Payer: Self-pay

## 2016-11-30 NOTE — Progress Notes (Signed)
Paramedicine Encounter    Patient ID: Kathy Rosales, female    DOB: 06/30/1975, 41 y.o.   MRN: [id number]   Patient Care Team: Ladell Pier, MD as PCP - General (Internal Medicine) Brunetta Genera, MD as Consulting Physician (Hematology and Oncology) Martinique, Betty G, MD (Family Medicine)  Patient Active Problem List   Diagnosis Date Noted  . Left-sided weakness   . Unresponsive   . Hypokalemia 08/03/2016  . Chronic systolic heart failure ([place]) 05/22/2016  . Mitral regurgitation 05/22/2016  . Near syncope 05/22/2016  . Delirium due to multiple etiologies, persistent, mixed level of activity 12/13/2015  . MDD (major depressive disorder), single episode, moderate ([place]) 12/13/2015  . Opioid use disorder, mild, abuse 12/13/2015  . UTI (urinary tract infection) 12/13/2015  . Fatigue 12/01/2015  . Orthostatic hypotension 12/01/2015  . Helicobacter positive gastritis 08/06/2015  . Gastric ulcer 08/06/2015  . Iron deficiency anemia due to chronic blood loss 08/06/2015  . Anemia 07/21/2015  . Iron deficiency anemia 10/22/2014  . B12 deficiency 10/22/2014  . Symptomatic anemia 08/28/2014  . Headache 08/28/2014  . Chest pain 08/28/2014  . 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  aspirin EC 81 MG tablet, Take 1 tablet (81 mg total) by mouth daily., Disp: 90 tablet, Rfl: 3 .  carvedilol (COREG) 6.25 MG tablet, Take 1 tablet (6.25 mg total) by mouth 2 (two) times daily., Disp: 60 tablet, Rfl: 3 .  digoxin (LANOXIN) 0.125 MG tablet, Take 1 tablet (0.125 mg total) by mouth daily., Disp: 90 tablet, Rfl: 3 .  furosemide (LASIX) 20 MG tablet, Take 2 tablets (40 mg total) by mouth daily., Disp: 180 tablet, Rfl: 3 .  losartan (COZAAR) 25 MG tablet, Take 0.5 tablets (12.5 mg total) by mouth at bedtime., Disp: 15 tablet, Rfl: 5 .  PARoxetine (PAXIL) 30 MG tablet, Take 1 tablet (30 mg total) by mouth daily., Disp: 30 tablet, Rfl: 3 .   potassium chloride 20 MEQ TBCR, Take 20 mEq by mouth daily., Disp: 90 tablet, Rfl: 3 .  riboflavin (VITAMIN B-2) 100 MG TABS tablet, Take 200 mg by mouth daily., Disp: , Rfl:  .  spironolactone (ALDACTONE) 25 MG tablet, Take 1 tablet (25 mg total) by mouth daily., Disp: 30 tablet, Rfl: 5 .  traZODone (DESYREL) 100 MG tablet, Take 200 mg by mouth at bedtime. , Disp: , Rfl:  .  Aspirin-Salicylamide-Caffeine (BC HEADACHE PO), Take 1 packet by mouth 4 (four) times daily as needed (headaches)., Disp: , Rfl:  Allergies  Allergen Reactions  . Tramadol Itching and Other (See Comments)    Red spots all over body and itching  . Leflunomide Rash     Social History   Social History  . Marital status: Married    Spouse name: N/A  . Number of children: 1  . Years of education: N/A   Occupational History  . unemployeed    Social History Main Topics  . Smoking status: Former Research scientist (life sciences)  . Smokeless tobacco: Never Used  . Alcohol use No  . Drug use: No  . Sexual activity: No   Other Topics Concern  . Not on file   Social History Narrative   ** Merged History Encounter **        Physical Exam  Constitutional: She is oriented to person, place, and time.  Cardiovascular: Normal rate and regular rhythm.   Pulmonary/Chest: Effort normal and breath sounds normal.  Abdominal: Soft.  Musculoskeletal: Normal  range of motion. She exhibits edema.  Neurological: She is alert and oriented to person, place, and time.  Skin: Skin is warm and dry.  Psychiatric: She has a normal mood and affect.        Future Appointments Date Time Provider [place]  12/18/2016 3:30 PM [facility] PA/NP [facility] None  01/02/2017 1:45 PM Evans Lance, MD [facility]   BP 108/80 (BP Location: Right Arm, Patient Position: Sitting, Cuff Size: Normal)   Pulse 68   Resp 16   Wt 133 lb (60.3 kg)   SpO2 97%   BMI 23.56 kg/m  Weight yesterday-132 lbs Last visit weight- 138 lbs @ clinic  Kathy Rosales  was seen at home today and reports feeling tired. She stated she is feeling depressed again and the Paxil is not working. She is taking 20 mg per day now instead of the 30 mg she was taking when I first met her. I believe this may have something to do with her depression. I have encouraged her to call her doctor about this but will follow up as well. She denied SOB, headaches and orthopnea. She displayed lower extremity edema for the first time since I have been seeing her and her ReDS clip reading was slightly elevated. She stated she was going to take an extra furosemide tablet tonight and see if it helped. She also said she was having trouble getting her defibrillator to transmit but I was able to get the transmitter to work without and issue. I will follow up to ensure the transmission was received.   ReDS clip reading: 36%  Time spent with patient: 35 minutes  Jacquiline Doe, EMT 11/30/16  ACTION: Home visit completed Next visit planned for 1 week

## 2016-12-01 NOTE — Telephone Encounter (Signed)
Notes recorded by Emily Filbert, RN on 11/29/2016 at 12:02 PM EDT I left a message of results on the patient's identified voice mail.

## 2016-12-08 ENCOUNTER — Emergency Department (HOSPITAL_COMMUNITY)
Admission: EM | Admit: 2016-12-08 | Discharge: 2016-12-08 | Disposition: A | Discharge status: Home / Self Care | Payer: 59 | Attending: Emergency Medicine | Admitting: Emergency Medicine

## 2016-12-08 DIAGNOSIS — Z23 Encounter for immunization: Secondary | ICD-10-CM | POA: Diagnosis not present

## 2016-12-08 DIAGNOSIS — I509 Heart failure, unspecified: Secondary | ICD-10-CM | POA: Diagnosis not present

## 2016-12-08 DIAGNOSIS — Z7982 Long term (current) use of aspirin: Secondary | ICD-10-CM | POA: Diagnosis not present

## 2016-12-08 DIAGNOSIS — Y9389 Activity, other specified: Secondary | ICD-10-CM | POA: Diagnosis not present

## 2016-12-08 DIAGNOSIS — Y999 Unspecified external cause status: Secondary | ICD-10-CM | POA: Insufficient documentation

## 2016-12-08 DIAGNOSIS — S8992XA Unspecified injury of left lower leg, initial encounter: Secondary | ICD-10-CM | POA: Diagnosis present

## 2016-12-08 DIAGNOSIS — Z87891 Personal history of nicotine dependence: Secondary | ICD-10-CM | POA: Diagnosis not present

## 2016-12-08 DIAGNOSIS — W540XXA Bitten by dog, initial encounter: Secondary | ICD-10-CM | POA: Diagnosis not present

## 2016-12-08 DIAGNOSIS — Z79899 Other long term (current) drug therapy: Secondary | ICD-10-CM | POA: Diagnosis not present

## 2016-12-08 DIAGNOSIS — Y929 Unspecified place or not applicable: Secondary | ICD-10-CM | POA: Insufficient documentation

## 2016-12-08 DIAGNOSIS — S81852A Open bite, left lower leg, initial encounter: Secondary | ICD-10-CM | POA: Diagnosis not present

## 2016-12-08 MED ORDER — TETANUS-DIPHTH-ACELL PERTUSSIS 5-2.5-18.5 LF-MCG/0.5 IM SUSP
0.5000 mL | Freq: Once | INTRAMUSCULAR | Status: AC
  Administered 2016-12-08: 0.5 mL via INTRAMUSCULAR
  Filled 2016-12-08: qty 0.5

## 2016-12-08 MED ORDER — AMOXICILLIN-POT CLAVULANATE 875-125 MG PO TABS: 1.0000 | ORAL_TABLET | Freq: Two times a day (BID) | ORAL | 0 refills | Status: AC

## 2016-12-08 NOTE — ED Notes (Signed)
Dressing applied to wound on leg.  Xeroform gauze with coban.  Advised pt to remove tomorrow and after showering she should pat dry and apply new band aid

## 2016-12-08 NOTE — ED Triage Notes (Signed)
Reports being bit by a large dog yesterday in left lower leg.  C/o pain in leg and itching to face since.  No rash noted to face at this time.  Per patient pet owner reports dog is up to date on vaccinations.

## 2016-12-08 NOTE — Discharge Instructions (Signed)
Please read attached information regarding your condition. Take Augmentin twice daily for 1 week. Apply antibiotic ointment to affected area. Take Benadryl, Claritin or Zyrtec as needed for itching and allergy symptoms. Return to ED for signs of infection including redness, temperature change, increased drainage, fevers, signs of severe allergic reaction such as lip swelling or trouble breathing.

## 2016-12-08 NOTE — ED Provider Notes (Signed)
[facility] DEPT Provider Note   CSN: [id number] Arrival date & time: 12/08/16  1941     History   Chief Complaint Chief Complaint  Patient presents with  . Animal Bite    HPI Kathy Rosales Kathy Rosales is a 41 y.o. female.  HPI  Patient presents to ED for a dog bite that occurred yesterday. She was bit on her left lower leg. She states that it was not her dog but her neighbor said that the dog's vaccinations were up-to-date. She is unsure of last tetanus. States that the bleeding has been controlled with Band-Aid. Patient also complains of itching to the face. States that this began after the dog bite occurred. She has not tried any medications to help with pain. She denies any lip swelling, trouble breathing, trouble swallowing or signs of severe allergic reaction.  Past Medical History:  Diagnosis Date  . Acute systolic CHF (congestive heart failure) ([place]) 05/22/2016  . Blood transfusion without reported diagnosis   . 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 Left-sided weakness   . Unresponsive   . Hypokalemia 08/03/2016  . Chronic systolic heart failure ([place]) 05/22/2016  . Mitral regurgitation 05/22/2016  . Near syncope 05/22/2016  . Delirium due to multiple etiologies, persistent, mixed level of activity 12/13/2015  . MDD (major depressive disorder), single episode, moderate ([place]) 12/13/2015  . Opioid use disorder, mild, abuse 12/13/2015  . UTI (urinary tract infection) 12/13/2015  . Fatigue 12/01/2015  . Orthostatic hypotension 12/01/2015  . Helicobacter positive gastritis 08/06/2015  . Gastric ulcer 08/06/2015  . Iron deficiency anemia due to chronic blood loss 08/06/2015  . Anemia 07/21/2015  . Iron deficiency anemia 10/22/2014  . B12 deficiency 10/22/2014  . Symptomatic anemia 08/28/2014  . Headache 08/28/2014  . Chest pain 08/28/2014  . 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 COLONOSCOPY WITH PROPOFOL N/A 07/23/2015   Procedure: COLONOSCOPY WITH PROPOFOL;  Surgeon: Ronald Lobo, MD;  [facility];  Service: Endoscopy;  Laterality: N/A;  . ESOPHAGOGASTRODUODENOSCOPY (EGD) WITH PROPOFOL N/A 07/23/2015   Procedure: ESOPHAGOGASTRODUODENOSCOPY (EGD) WITH PROPOFOL;  Surgeon: Ronald Lobo, MD;  [facility];  Service: Endoscopy;  Laterality: N/A;  . ESOPHAGOGASTRODUODENOSCOPY (EGD) WITH PROPOFOL N/A 11/18/2015   Procedure: ESOPHAGOGASTRODUODENOSCOPY (EGD) WITH PROPOFOL;  Surgeon: Ronald Lobo, MD;  [facility];  Service: Endoscopy;  Laterality: N/A;  . RIGHT/LEFT HEART CATH AND CORONARY ANGIOGRAPHY N/A 05/22/2016   Procedure: Right/Left Heart Cath and Coronary Angiography;  Surgeon: Peter M Martinique, MD;  Location: [facility] CV LAB;  Service: Cardiovascular;  Laterality: N/A;  . SUBQ ICD IMPLANT N/A 09/27/2016   Procedure: SubQ ICD Implant;  Surgeon: Evans Lance, MD;  Location: [facility] CV LAB;  Service: Cardiovascular;  Laterality: N/A;    OB History    No data available       Home Medications    Prior to Admission medications   Medication Sig Start Date End Date Taking? Authorizing Provider  amoxicillin-clavulanate (AUGMENTIN) 875-125 MG tablet Take 1 tablet by mouth 2 (two) times daily. 12/08/16 12/15/16  Delia Heady, PA-C  aspirin EC 81 MG tablet Take 1 tablet (81 mg total) by mouth daily. 05/15/16   Lelon Perla, MD  Aspirin-Salicylamide-Caffeine (BC HEADACHE PO) Take 1 packet by mouth 4 (four) times daily as needed (headaches).    [provider]  carvedilol (COREG) 6.25 MG tablet Take  1 tablet (6.25 mg total) by mouth 2 (two) times daily. 09/25/16 12/24/16  Larey Dresser, MD  digoxin (LANOXIN) 0.125 MG tablet Take 1 tablet (0.125 mg total) by mouth daily. 06/09/16   Lelon Perla, MD  furosemide (LASIX) 20 MG tablet Take 2 tablets (40 mg total) by mouth daily. 06/09/16   Lelon Perla,  MD  losartan (COZAAR) 25 MG tablet Take 0.5 tablets (12.5 mg total) by mouth at bedtime. 06/29/16   Clegg, Amy D, NP  PARoxetine (PAXIL) 30 MG tablet Take 1 tablet (30 mg total) by mouth daily. 08/21/16   Maren Reamer, MD  potassium chloride 20 MEQ TBCR Take 20 mEq by mouth daily. 06/01/16   Clegg, Amy D, NP  riboflavin (VITAMIN B-2) 100 MG TABS tablet Take 200 mg by mouth daily.    [provider]  spironolactone (ALDACTONE) 25 MG tablet Take 1 tablet (25 mg total) by mouth daily. 11/06/16 02/04/17  Shirley Friar, PA-C  traZODone (DESYREL) 100 MG tablet Take 200 mg by mouth at bedtime.     [provider]    Family History Family History  Problem Relation Age of Onset  . Cancer Paternal Grandfather   . Mental illness Neg Hx     Social History Social History  Substance Use Topics  . Smoking status: Former Research scientist (life sciences)  . Smokeless tobacco: Never Used  . Alcohol use No     Allergies   Tramadol; Furosemide; and Leflunomide   Review of Systems Review of Systems  Constitutional: Negative for chills and fever.  HENT: Negative for facial swelling and trouble swallowing.   Eyes: Negative for pain, discharge and itching.  Respiratory: Negative for shortness of breath.   Skin: Positive for wound. Negative for color change.     Physical Exam Updated Vital Signs BP (!) 96/54   Pulse 83   Temp 98 F (36.7 C) (Oral)   Resp 18   Ht 5\' 3"  (1.6 m)   Wt 59 kg (130 lb)   SpO2 100%   BMI 23.03 kg/m   Physical Exam  Constitutional: She appears well-developed and well-nourished. No distress.  Nontoxic-appearing and in no acute distress. No signs of airway compromise.  HENT:  Head: Normocephalic and atraumatic.  No facial swelling or rash noted.  Eyes: Conjunctivae and EOM are normal. No scleral icterus.  Neck: Normal range of motion.  Pulmonary/Chest: Effort normal. No respiratory distress.  Neurological: She is alert.  Skin: Laceration noted. No rash  noted. She is not diaphoretic.  Several superficial lacerations to left shin. Bleeding is controlled. No color or temperature change suspicious for cellulitis.  Psychiatric: She has a normal mood and affect.  Nursing note and vitals reviewed.    ED Treatments / Results  Labs (all labs ordered are listed, but only abnormal results are displayed) Labs Reviewed - No data to display  EKG  EKG Interpretation None       Radiology No results found.  Procedures Procedures (including critical care time)  Medications Ordered in ED Medications  Tdap (BOOSTRIX) injection 0.5 mL (not administered)     Initial Impression / Assessment and Plan / ED Course  I have reviewed the triage vital signs and the nursing notes.  Pertinent labs & imaging results that were available during my care of the patient were reviewed by me and considered in my medical decision making (see chart for details).     Patient presents to ED for dog bite that occurred yesterday  on left lower leg. She also reports some itchiness for her face which she states she is unsure if related to the dog bite. She is not taking any medications to help with pain or itching. On physical exam there is evidence of a superficial dog bite on the left lower leg. Bleeding is controlled with no signs of overlying cellulitis. Full active and passive range of motion of joints above and below the bite. She is afebrile with no history of fever. I did not notice any facial swelling or rash of the face that would concern me for severe allergic reaction. She states that the dog is up-to-date on vaccinations. Patient is unsure of last tetanus. Patient given his tetanus update here in the ED. Area was extensively cleaned with Betadine and saline and dressed with antibiotic ointment. Patient will be given Augmentin for infection prophylaxis. Patient appears stable for discharge at this time. Strict return precautions given for severe or worsening  symptoms.  Final Clinical Impressions(s) / ED Diagnoses   Final diagnoses:  Dog bite of left lower leg, initial encounter    New Prescriptions New Prescriptions   AMOXICILLIN-CLAVULANATE (AUGMENTIN) 875-125 MG TABLET    Take 1 tablet by mouth 2 (two) times daily.     Delia Heady, PA-C 12/08/16 2243    Dorie Rank, MD 12/08/16 [phone]

## 2016-12-12 ENCOUNTER — Other Ambulatory Visit (HOSPITAL_COMMUNITY): Payer: Self-pay

## 2016-12-12 NOTE — Progress Notes (Signed)
Paramedicine Encounter    Patient ID: Kathy Rosales, female    DOB: 15-Oct-1975, 41 y.o.   MRN: [id number]   Patient Care Team: Ladell Pier, MD as PCP - General (Internal Medicine) Brunetta Genera, MD as Consulting Physician (Hematology and Oncology) Martinique, Betty G, MD (Family Medicine)  Patient Active Problem List   Diagnosis Date Noted  . Left-sided weakness   . Unresponsive   . Hypokalemia 08/03/2016  . Chronic systolic heart failure ([place]) 05/22/2016  . Mitral regurgitation 05/22/2016  . Near syncope 05/22/2016  . Delirium due to multiple etiologies, persistent, mixed level of activity 12/13/2015  . MDD (major depressive disorder), single episode, moderate ([place]) 12/13/2015  . Opioid use disorder, mild, abuse 12/13/2015  . UTI (urinary tract infection) 12/13/2015  . Fatigue 12/01/2015  . Orthostatic hypotension 12/01/2015  . Helicobacter positive gastritis 08/06/2015  . Gastric ulcer 08/06/2015  . Iron deficiency anemia due to chronic blood loss 08/06/2015  . Anemia 07/21/2015  . Iron deficiency anemia 10/22/2014  . B12 deficiency 10/22/2014  . Symptomatic anemia 08/28/2014  . Headache 08/28/2014  . Chest pain 08/28/2014  . 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  amoxicillin-clavulanate (AUGMENTIN) 875-125 MG tablet, Take 1 tablet by mouth 2 (two) times daily., Disp: 14 tablet, Rfl: 0 .  aspirin EC 81 MG tablet, Take 1 tablet (81 mg total) by mouth daily., Disp: 90 tablet, Rfl: 3 .  Aspirin-Salicylamide-Caffeine (BC HEADACHE PO), Take 1 packet by mouth 4 (four) times daily as needed (headaches)., Disp: , Rfl:  .  carvedilol (COREG) 6.25 MG tablet, Take 1 tablet (6.25 mg total) by mouth 2 (two) times daily., Disp: 60 tablet, Rfl: 3 .  digoxin (LANOXIN) 0.125 MG tablet, Take 1 tablet (0.125 mg total) by mouth daily., Disp: 90 tablet, Rfl: 3 .  furosemide (LASIX) 20 MG tablet, Take 2 tablets (40 mg total) by mouth  daily., Disp: 180 tablet, Rfl: 3 .  losartan (COZAAR) 25 MG tablet, Take 0.5 tablets (12.5 mg total) by mouth at bedtime., Disp: 15 tablet, Rfl: 5 .  PARoxetine (PAXIL) 30 MG tablet, Take 1 tablet (30 mg total) by mouth daily., Disp: 30 tablet, Rfl: 3 .  potassium chloride 20 MEQ TBCR, Take 20 mEq by mouth daily., Disp: 90 tablet, Rfl: 3 .  riboflavin (VITAMIN B-2) 100 MG TABS tablet, Take 200 mg by mouth daily., Disp: , Rfl:  .  spironolactone (ALDACTONE) 25 MG tablet, Take 1 tablet (25 mg total) by mouth daily., Disp: 30 tablet, Rfl: 5 .  traZODone (DESYREL) 100 MG tablet, Take 200 mg by mouth at bedtime. , Disp: , Rfl:  Allergies  Allergen Reactions  . Tramadol Itching and Other (See Comments)    Red spots all over body and itching  . Furosemide Rash  . Leflunomide Rash     Social History   Social History  . Marital status: Married    Spouse name: N/A  . Number of children: 1  . Years of education: N/A   Occupational History  . unemployeed    Social History Main Topics  . Smoking status: Former Research scientist (life sciences)  . Smokeless tobacco: Never Used  . Alcohol use No  . Drug use: No  . Sexual activity: No   Other Topics Concern  . Not on file   Social History Narrative   ** Merged History Encounter **        Physical Exam  Constitutional: She is oriented to person,  place, and time.  Cardiovascular: Normal rate and regular rhythm.   Pulmonary/Chest: Effort normal and breath sounds normal. No respiratory distress. She has no wheezes. She has no rales.  Abdominal: Soft.  Musculoskeletal: Normal range of motion. She exhibits no edema.  Neurological: She is alert and oriented to person, place, and time.  Skin: Skin is warm and dry.  Psychiatric: She has a normal mood and affect.        Future Appointments Date Time Provider [place]  12/18/2016 3:30 PM [facility] PA/NP [facility] None  01/02/2017 1:45 PM Evans Lance, MD [facility]   BP 100/70 (BP  Location: Right Arm, Patient Position: Sitting, Cuff Size: Normal)   Pulse 82   Resp 16   Wt 134 lb (60.8 kg)   SpO2 98%   BMI 23.74 kg/m  Weight yesterday- 134 lbs Last visit weight- 133 lbs  Kathy Rosales was seen at home today. She stated she has been having headaches lately which originate in her sinus area and her eyes have been itching. I suggested she try using OTC allergy medication such as Claritin or its generic form for the next wek and see if this helps her symptoms. She denied SOB, dizziness of orthopnea. She also requested I call [facility] about hey Paxil dose. She stated the 20 mg does not work as well as the 30 mg she was taking before. Medications were verified and her pillbox was refilled.  Time spent with patient: 29 minutes  Kathy Rosales, EMT 12/13/16  ACTION: Home visit completed Next visit planned for 1 week

## 2016-12-18 ENCOUNTER — Ambulatory Visit (HOSPITAL_COMMUNITY)
Admission: RE | Admit: 2016-12-18 | Discharge: 2016-12-18 | Disposition: A | Discharge status: Home / Self Care | Payer: 59 | Source: Ambulatory Visit | Attending: Cardiology | Admitting: Cardiology

## 2016-12-18 ENCOUNTER — Encounter (HOSPITAL_COMMUNITY): Payer: Self-pay

## 2016-12-18 VITALS — BP 112/68 | HR 74 | Wt 139.4 lb

## 2016-12-18 DIAGNOSIS — R079 Chest pain, unspecified: Secondary | ICD-10-CM | POA: Diagnosis not present

## 2016-12-18 DIAGNOSIS — I429 Cardiomyopathy, unspecified: Secondary | ICD-10-CM | POA: Insufficient documentation

## 2016-12-18 DIAGNOSIS — Z79899 Other long term (current) drug therapy: Secondary | ICD-10-CM | POA: Insufficient documentation

## 2016-12-18 DIAGNOSIS — Z87891 Personal history of nicotine dependence: Secondary | ICD-10-CM | POA: Diagnosis not present

## 2016-12-18 DIAGNOSIS — Z7982 Long term (current) use of aspirin: Secondary | ICD-10-CM | POA: Diagnosis not present

## 2016-12-18 DIAGNOSIS — I5022 Chronic systolic (congestive) heart failure: Secondary | ICD-10-CM | POA: Insufficient documentation

## 2016-12-18 DIAGNOSIS — I428 Other cardiomyopathies: Secondary | ICD-10-CM

## 2016-12-18 DIAGNOSIS — I340 Nonrheumatic mitral (valve) insufficiency: Secondary | ICD-10-CM | POA: Diagnosis not present

## 2016-12-18 LAB — BASIC METABOLIC PANEL
Anion gap: 7 (ref 5–15)
BUN: 11 mg/dL (ref 6–20)
CO2: 26 mmol/L (ref 22–32)
Calcium: 8.5 mg/dL — ABNORMAL LOW (ref 8.9–10.3)
Chloride: 105 mmol/L (ref 101–111)
Creatinine, Ser: 0.77 mg/dL (ref 0.44–1.00)
GFR calc Af Amer: >60 mL/min (ref >60)
GFR calc non Af Amer: >60 mL/min (ref >60)
Glucose, Bld: 108 mg/dL — ABNORMAL HIGH (ref 65–99)
Potassium: 3.9 mmol/L (ref 3.5–5.1)
Sodium: 138 mmol/L (ref 135–145)

## 2016-12-18 MED ORDER — LOSARTAN POTASSIUM 25 MG PO TABS: 25.0000 mg | ORAL_TABLET | Freq: Every day | ORAL | 5 refills | Status: DC

## 2016-12-18 NOTE — Patient Instructions (Signed)
Increase Losartan 25 mg (1 tab) daily   Labs drawn today  Your physician recommends that you schedule a follow-up appointment in: 3 months

## 2016-12-18 NOTE — Progress Notes (Signed)
PCP: Dr. Janne Napoleon Primary Cardiologist: Dr. Stanford Breed  HF MD: Dr Aundra Dubin     Advanced Heart Failure Clinic Note   HPI:  [name] is a 41 year old female with a past medical history of NICM EF 25-30% (cath in 05/2016 with normal cors), moderate mitral regurgitation, and tricuspid regurgitation. No FH of coronary disease.   Diagnosed with CHF and  went to her PCP in January 2018 for dyspnea. Says she started getting SOB in the fall. Denies any type of viral illness. No family history of heart disease. Echo was performed and showed reduced EF 20-25% with mod-severe MR. at 25-30%. She was referred to Dr. Stanford Breed who then set her up for a left and right heart cath. Admitted over night on 2/19 after cath due to changes in LOC after cath, was hypotensive. Symptoms resolved, head CT negative. Optimization of her medications was prohibited by hypotension prior to discharge. Started on corlanor.   Had CPX 07/15/16. Results as below. Immediately after, pt sat in chair and then had a pseudoseizure. No tonic/clonic activity. Pt taken to ED with Neurology consulted. Pt recovered spontaneously recovered. CT angio chest and CT head  unremarkable.  Repeat echo was done in 4/18.  EF remains 20-25% with moderate diastolic dysfunction.   She is s/p SubQ ICD implantation 09/27/16. Admission complicated by AMS thought to be psychogenic. CT head, CTA head/neck and EEG were all negative.   She presents today for HF follow up. Denies SOB. Main complaint continues to be chest pain at ICD site. She saw EP in August and site was stable. Pain is intermittent and comes and goes. Interpretor was used today. Weight at home stable. Taking all medications. No SOB with ADL's or walking in stores. Denies orthopnea, PND.   ECG : NSR, anterior ST flattening consistent with prior EKG's.   Labs (6/18): K 3.5, creatinine 0.71, hgb 13.1  CPX 07/18/16 Pre-Exercise PFTs  FVC 2.79 (92%)    FEV1 2.38 (93%)     FEV1/FVC 85 (101%)      MVV 48 (49%) Exercise Time:  3:15  Speed (mph): 1.5   Grade (%): 0   RPE: 17 Reason stopped: Patient ended test due to Dyspnea (8/10) Additional symptoms: lightheaded (9/10), chest pressure (9/10) Resting HR: 84 Peak HR: 95  (53% age predicted max HR) BP rest: 92/62 BP peak: 88/60 Peak VO2: 14.1 (44% predicted peak VO2) VE/VCO2 slope: 59.4 OUES: 0.73 Peak RER: 0.96 Ventilatory Threshold: Not achieved  Peak RR 77 Peak Ventilation: 28.8 VE/MVV: 60% PETCO2 at peak: 28 O2pulse: 8  80% predicted O2pulse)  RHC/LHC 2/19/018  RA 3 PCWP  20 CO/CI 4.43/2.88 LHC normal cors.   12/10/2015 TSH 2.7   ECHO 04/2016 EF 20-25%. Mod-Severe MR, Mod TR.  ECHO 4/18 EF 202-5%, moderate diastolic dysfunction, PASP 31 mmHg.   SH: Lives with Mom. No alcohol or drug abuse. Does not smoke cigarettes  FH: No history of CAD.   Review of systems complete and found to be negative unless listed in HPI.    SH:  Social History   Social History  . Marital status: Married    Spouse name: N/A  . Number of children: 1  . Years of education: N/A   Occupational History  . unemployeed    Social History Main Topics  . Smoking status: Former Research scientist (life sciences)  . Smokeless tobacco: Never Used  . Alcohol use No  . Drug use: No  . Sexual activity: No   Other Topics Concern  .  Not on file   Social History Narrative   ** Merged History Encounter **        FH:  Family History  Problem Relation Age of Onset  . Cancer Paternal Grandfather   . Mental illness Neg Hx     Past Medical History:  Diagnosis Date  . Acute systolic CHF (congestive heart failure) ([place]) 05/22/2016  . Blood transfusion without reported diagnosis   . 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 aspirin EC 81 MG tablet Take 1 tablet (81 mg total) by mouth daily. 90 tablet 3  . Aspirin-Salicylamide-Caffeine (BC HEADACHE PO) Take 1 packet by mouth 4 (four) times daily  as needed (headaches).    . carvedilol (COREG) 6.25 MG tablet Take 1 tablet (6.25 mg total) by mouth 2 (two) times daily. 60 tablet 3  . digoxin (LANOXIN) 0.125 MG tablet Take 1 tablet (0.125 mg total) by mouth daily. 90 tablet 3  . furosemide (LASIX) 20 MG tablet Take 2 tablets (40 mg total) by mouth daily. 180 tablet 3  . losartan (COZAAR) 25 MG tablet Take 0.5 tablets (12.5 mg total) by mouth at bedtime. 15 tablet 5  . PARoxetine (PAXIL) 30 MG tablet Take 1 tablet (30 mg total) by mouth daily. 30 tablet 3  . potassium chloride 20 MEQ TBCR Take 20 mEq by mouth daily. 90 tablet 3  . riboflavin (VITAMIN B-2) 100 MG TABS tablet Take 200 mg by mouth daily.    Marland Kitchen spironolactone (ALDACTONE) 25 MG tablet Take 1 tablet (25 mg total) by mouth daily. 30 tablet 5  . traZODone (DESYREL) 100 MG tablet Take 200 mg by mouth at bedtime.      No current facility-administered medications for this encounter.     Vitals:   12/18/16 1537  BP: 112/68  Pulse: 74  SpO2: 98%  Weight: 139 lb 6.4 oz (63.2 kg)   Wt Readings from Last 3 Encounters:  12/18/16 139 lb 6.4 oz (63.2 kg)  12/12/16 134 lb (60.8 kg)  12/08/16 130 lb (59 kg)    PHYSICAL EXAM: Interpreter present  General: Well appearing. No resp difficulty. HEENT: Normal Neck: Supple. JVP 5-6. Carotids 2+ bilat; no bruits. No thyromegaly or nodule noted. Cor: PMI nondisplaced. RRR, No M/G/R noted Lungs: CTAB, normal effort. Abdomen: Soft, non-tender, non-distended, no HSM. No bruits or masses. +BS  Extremities: No cyanosis, clubbing, rash, R and LLE no edema.  Neuro: Alert & orientedx3, cranial nerves grossly intact. moves all 4 extremities w/o difficulty. Affect pleasant  ASSESSMENT & PLAN: 1. Chronic Systolic Heart Failure: Nonischemic cardiomyopathy, ?viral cardiomyopathy. LHC 05/2016 no coronary disease. Echo 05/2016 EF 20-25% mod MR, repeat echo 07/21/16 LVEF 20-25%.   - NYHA class II - Volume stable on exam - Continue lasix 40 mg daily -  Continue Coreg 6.25 mg BID - Increase losartan to 25 mg hs.  - Continue Spiro 25 mg q hs.  - Continue digoxin. Recent level ok.  - CPX limited by submaximal effort and pseudoseizure at end of test, but showed severe HF limitation. Unclear how valid test results were.   2. Mitral Regurgitation:  - Mild by Echo in April 2018.   3. Atypical chest pain: - Felt to be related to subq ICD. EKG non ischemic - Has follow up in 2 weeks with EP.   BMET today. Follow up in 3 months.   Arbutus Leas, NP  12/18/2016 3:51 PM

## 2016-12-21 ENCOUNTER — Other Ambulatory Visit (HOSPITAL_COMMUNITY): Payer: Self-pay

## 2016-12-21 NOTE — Progress Notes (Signed)
Kathy Rosales reached out to me via text to advise that she did not want me to come by today at 17:30 which we had previously discussed and she would see me next week.

## 2016-12-26 ENCOUNTER — Ambulatory Visit: Payer: Self-pay | Admitting: Internal Medicine

## 2017-01-02 ENCOUNTER — Encounter: Payer: Self-pay | Admitting: Internal Medicine

## 2017-01-02 ENCOUNTER — Other Ambulatory Visit (HOSPITAL_COMMUNITY): Payer: Self-pay

## 2017-01-02 ENCOUNTER — Ambulatory Visit (INDEPENDENT_AMBULATORY_CARE_PROVIDER_SITE_OTHER): Payer: 59 | Admitting: Internal Medicine

## 2017-01-02 VITALS — BP 104/68 | HR 84 | Ht 62.0 in | Wt 139.2 lb

## 2017-01-02 DIAGNOSIS — I5022 Chronic systolic (congestive) heart failure: Secondary | ICD-10-CM | POA: Diagnosis not present

## 2017-01-02 DIAGNOSIS — I428 Other cardiomyopathies: Secondary | ICD-10-CM | POA: Diagnosis not present

## 2017-01-02 NOTE — Progress Notes (Signed)
HPI [name] returns today for ongoing evaluation and management of her ICD. She has a h/o chronic systolic heart failure, s/p S-ICD insertion with the patient developing AMS after which resolved. She did not have a neurological event. In the interim, she has done well. She thinks that her device has moved slightly anterior. No chest pain or sob. Allergies  Allergen Reactions  . Tramadol Itching and Other (See Comments)    Red spots all over body and itching  . Furosemide Rash  . Leflunomide Rash     Current Outpatient Prescriptions  Medication Sig Dispense Refill  . aspirin EC 81 MG tablet Take 1 tablet (81 mg total) by mouth daily. 90 tablet 3  . Aspirin-Salicylamide-Caffeine (BC HEADACHE PO) Take 1 packet by mouth 4 (four) times daily as needed (headaches).    . carvedilol (COREG) 6.25 MG tablet Take 1 tablet (6.25 mg total) by mouth 2 (two) times daily. 60 tablet 3  . digoxin (LANOXIN) 0.125 MG tablet Take 1 tablet (0.125 mg total) by mouth daily. 90 tablet 3  . furosemide (LASIX) 20 MG tablet Take 2 tablets (40 mg total) by mouth daily. 180 tablet 3  . losartan (COZAAR) 25 MG tablet Take 12.5 mg by mouth daily.    Marland Kitchen PARoxetine (PAXIL) 20 MG tablet Take 20 mg by mouth daily.    . potassium chloride 20 MEQ TBCR Take 20 mEq by mouth daily. 90 tablet 3  . riboflavin (VITAMIN B-2) 100 MG TABS tablet Take 200 mg by mouth daily.    Marland Kitchen spironolactone (ALDACTONE) 25 MG tablet Take 12.5 mg by mouth daily.    . traZODone (DESYREL) 100 MG tablet Take 200 mg by mouth at bedtime as needed for sleep.      No current facility-administered medications for this visit.      Past Medical History:  Diagnosis Date  . Acute systolic CHF (congestive heart failure) ([place]) 05/22/2016  . Blood transfusion without reported diagnosis   . 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   Past Surgical History:  Procedure Laterality Date  .  COLONOSCOPY WITH PROPOFOL N/A 07/23/2015   Procedure: COLONOSCOPY WITH PROPOFOL;  Surgeon: Ronald Lobo, MD;  [facility];  Service: Endoscopy;  Laterality: N/A;  . ESOPHAGOGASTRODUODENOSCOPY (EGD) WITH PROPOFOL N/A 07/23/2015   Procedure: ESOPHAGOGASTRODUODENOSCOPY (EGD) WITH PROPOFOL;  Surgeon: Ronald Lobo, MD;  [facility];  Service: Endoscopy;  Laterality: N/A;  . ESOPHAGOGASTRODUODENOSCOPY (EGD) WITH PROPOFOL N/A 11/18/2015   Procedure: ESOPHAGOGASTRODUODENOSCOPY (EGD) WITH PROPOFOL;  Surgeon: Ronald Lobo, MD;  [facility];  Service: Endoscopy;  Laterality: N/A;  . RIGHT/LEFT HEART CATH AND CORONARY ANGIOGRAPHY N/A 05/22/2016   Procedure: Right/Left Heart Cath and Coronary Angiography;  Surgeon: Peter M Martinique, MD;  Location: [place] CV LAB;  Service: Cardiovascular;  Laterality: N/A;  . SUBQ ICD IMPLANT N/A 09/27/2016   Procedure: SubQ ICD Implant;  Surgeon: Evans Lance, MD;  Location: [place] CV LAB;  Service: Cardiovascular;  Laterality: N/A;     Family History  Problem Relation Age of Onset  . Cancer Paternal Grandfather   . Mental illness Neg Hx      Social History   Social History  . Marital status: Married    Spouse name: N/A  . Number of children: 1  . Years of education: N/A   Occupational History  . unemployeed    Social  History Main Topics  . Smoking status: Former Research scientist (life sciences)  . Smokeless tobacco: Never Used  . Alcohol use No  . Drug use: No  . Sexual activity: No   Other Topics Concern  . Not on file   Social History Narrative   ** Merged History Encounter **         BP 104/68   Pulse 84   Ht 5\' 2"  (1.575 m)   Wt 139 lb 3.2 oz (63.1 kg)   SpO2 99%   BMI 25.46 kg/m   Physical Exam:  Well appearing NAD HEENT: Unremarkable Neck:  No JVD, no thyromegally Lymphatics:  No adenopathy Back:  No CVA tenderness Lungs:  Clear with no wheezes, well healed ICD incision. HEART:  Regular rate rhythm, no  murmurs, no rubs, no clicks Abd:  soft, positive bowel sounds, no organomegally, no rebound, no guarding Ext:  2 plus pulses, no edema, no cyanosis, no clubbing Skin:  No rashes no nodules Neuro:  CN II through XII intact, motor grossly intact    DEVICE  Normal sub Q ICD device function.  See PaceArt for details.   Assess/Plan: 1. Non-ischemic CM - she is stable and has class 2 symptoms. She will continue her current meds. 2. ICD - her subcutaneous device is working normally. Will recheck in several months. 3. Chronic systolic heart failure - her EF has been about 30%. She will continue her cuurent meds.  Mikle Bosworth.D.

## 2017-01-02 NOTE — Patient Instructions (Addendum)
Medication Instructions:  Your physician recommends that you continue on your current medications as directed. Please refer to the Current Medication list given to you today.  Labwork: None ordered.  Testing/Procedures: Your physician has requested that you have an echocardiogram. Echocardiography is a painless test that uses sound waves to create images of your heart. It provides your doctor with information about the size and shape of your heart and how well your heart's chambers and valves are working. This procedure takes approximately one hour. There are no restrictions for this procedure.  Please schedule an ECHO for February.  Follow-Up: Your physician wants you to follow-up in: 8 months with Dr. Lovena Le.   You will receive a reminder letter in the mail two months in advance. If you don't receive a letter, please call our office to schedule the follow-up appointment.  Remote monitoring is used to monitor your ICD from home. This monitoring reduces the number of office visits required to check your device to one time per year. It allows [place] to keep an eye on the functioning of your device to ensure it is working properly. You are scheduled for a device check from home on 04/04/2016. You may send your transmission at any time that day. If you have a wireless device, the transmission will be sent automatically. After your physician reviews your transmission, you will receive a postcard with your next transmission date.    Any Other Special Instructions Will Be Listed Below (If Applicable).     If you need a refill on your cardiac medications before your next appointment, please call your pharmacy.

## 2017-01-02 NOTE — Progress Notes (Signed)
Paramedicine Encounter    Patient ID: Kathy Rosales, female    DOB: [birth year], 41 y.o.   MRN: [id number]   Patient Care Team: Ladell Pier, MD as PCP - General (Internal Medicine) Brunetta Genera, MD as Consulting Physician (Hematology and Oncology) [place]ique, Betty G, MD (Family Medicine)  Patient Active Problem List   Diagnosis Date Noted  . Left-sided weakness   . Unresponsive   . Hypokalemia 08/03/2016  . Chronic systolic heart failure ([place]) 05/22/2016  . Mitral regurgitation 05/22/2016  . Near syncope 05/22/2016  . Delirium due to multiple etiologies, persistent, mixed level of activity 12/13/2015  . MDD (major depressive disorder), single episode, moderate ([place]) 12/13/2015  . Opioid use disorder, mild, abuse ([place]) 12/13/2015  . UTI (urinary tract infection) 12/13/2015  . Fatigue 12/01/2015  . Orthostatic hypotension 12/01/2015  . Helicobacter positive gastritis 08/06/2015  . Gastric ulcer 08/06/2015  . Iron deficiency anemia due to chronic blood loss 08/06/2015  . Anemia 07/21/2015  . Iron deficiency anemia 10/22/2014  . B12 deficiency 10/22/2014  . Symptomatic anemia 08/28/2014  . Headache 08/28/2014  . Chest pain 08/28/2014  . 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  aspirin EC 81 MG tablet, Take 1 tablet (81 mg total) by mouth daily., Disp: 90 tablet, Rfl: 3 .  Aspirin-Salicylamide-Caffeine (BC HEADACHE PO), Take 1 packet by mouth 4 (four) times daily as needed (headaches)., Disp: , Rfl:  .  carvedilol (COREG) 6.25 MG tablet, Take 1 tablet (6.25 mg total) by mouth 2 (two) times daily., Disp: 60 tablet, Rfl: 3 .  digoxin (LANOXIN) 0.125 MG tablet, Take 1 tablet (0.125 mg total) by mouth daily., Disp: 90 tablet, Rfl: 3 .  furosemide (LASIX) 20 MG tablet, Take 2 tablets (40 mg total) by mouth daily., Disp: 180 tablet, Rfl: 3 .  losartan (COZAAR) 25 MG tablet, Take 12.5 mg by mouth daily., Disp: , Rfl:  .   PARoxetine (PAXIL) 20 MG tablet, Take 20 mg by mouth daily., Disp: , Rfl:  .  potassium chloride 20 MEQ TBCR, Take 20 mEq by mouth daily., Disp: 90 tablet, Rfl: 3 .  riboflavin (VITAMIN B-2) 100 MG TABS tablet, Take 200 mg by mouth daily., Disp: , Rfl:  .  spironolactone (ALDACTONE) 25 MG tablet, Take 12.5 mg by mouth daily., Disp: , Rfl:  .  traZODone (DESYREL) 100 MG tablet, Take 200 mg by mouth at bedtime as needed for sleep. , Disp: , Rfl:  Allergies  Allergen Reactions  . Tramadol Itching and Other (See Comments)    Red spots all over body and itching  . Furosemide Rash  . Leflunomide Rash     Social History   Social History  . Marital status: Married    Spouse name: N/A  . Number of children: 1  . Years of education: N/A   Occupational History  . unemployeed    Social History Main Topics  . Smoking status: Former Research scientist (life sciences)  . Smokeless tobacco: Never Used  . Alcohol use No  . Drug use: No  . Sexual activity: No   Other Topics Concern  . Not on file   Social History Narrative   ** Merged History Encounter **        Physical Exam  Constitutional: She is oriented to person, place, and time.  Neck: Normal range of motion. No JVD present.  Cardiovascular: Normal rate and regular rhythm.   Pulmonary/Chest: Effort normal and breath sounds normal. No  respiratory distress. She has no wheezes. She has no rales.  Musculoskeletal: Normal range of motion. She exhibits no edema.  Neurological: She is alert and oriented to person, place, and time.  Skin: Skin is warm and dry.  Psychiatric: She has a normal mood and affect.        Future Appointments Date Time Provider [place]  03/19/2017 3:00 PM [facility] PA/NP [facility] None  05/07/2017 4:00 PM [facility] 2 [facility]   BP 100/74 (BP Location: Right Arm, Patient Position: Sitting, Cuff Size: Normal)   Pulse 84   Resp 16   Wt 139 lb (63 kg)   SpO2 98%   BMI 25.42 kg/m  Weight yesterday- 137  lb Last visit weight- 134 lb  Kathy Rosales was seen at home today. She reported feeling increased SOB in the evenings when she laid down but did not exhibit any other signs of fluid overload. She did not have dependent edema or JVD. Her lung sounds were clear and her abdomen was not distended. She denied dizziness or headaches and said her SOB only happens at night when she goes to bed. She shared the same with her physician at an office visit today but she said they did not have a cause for the SOB either. She reports being compliant with her medications which were verified and her pillbox was refilled. All medications have been moved to [place] at this time.   Jacquiline Doe, EMT 01/02/17  ACTION: Home visit completed Next visit planned for 2 weeks

## 2017-01-04 LAB — CUP PACEART INCLINIC DEVICE CHECK
Date Time Interrogation Session: 20181004104837
Implantable Lead Implant Date: 20180627
Implantable Lead Location: 753862
Implantable Lead Model: 3401
Implantable Lead Serial Number: 113145
Implantable Pulse Generator Implant Date: 20180627
Pulse Gen Serial Number: 222625

## 2017-01-10 ENCOUNTER — Telehealth: Payer: Self-pay | Admitting: Internal Medicine

## 2017-01-10 ENCOUNTER — Ambulatory Visit: Payer: Self-pay

## 2017-01-10 DIAGNOSIS — Z1239 Encounter for other screening for malignant neoplasm of breast: Secondary | ICD-10-CM

## 2017-01-10 NOTE — Telephone Encounter (Signed)
Will forward to pcp

## 2017-01-10 NOTE — Telephone Encounter (Signed)
Pt. Came to facility requesting a referral to have a mammogram done. Please f/u with pt.

## 2017-02-07 ENCOUNTER — Telehealth (HOSPITAL_COMMUNITY): Payer: Self-pay

## 2017-02-07 NOTE — Telephone Encounter (Signed)
I called Kathy Rosales to schedule an appointment. She did not answer so I left a VM requesting she call me back.

## 2017-02-08 ENCOUNTER — Telehealth (HOSPITAL_COMMUNITY): Payer: Self-pay

## 2017-02-08 NOTE — Telephone Encounter (Signed)
I called Kathy Rosales today to schedule an appointment. She did not answer and her voicemail was full.

## 2017-02-14 ENCOUNTER — Telehealth (HOSPITAL_COMMUNITY): Payer: Self-pay

## 2017-02-14 NOTE — Telephone Encounter (Signed)
I called Kathy Rosales to schedule an appointment. She did not answer and her voicemail was not available. I have been having increasing trouble getting appointments with her. I believe she is self sufficient and will propose discharge the next time we speak.

## 2017-02-19 ENCOUNTER — Other Ambulatory Visit (HOSPITAL_COMMUNITY): Payer: Self-pay

## 2017-02-19 NOTE — Progress Notes (Signed)
Paramedicine Encounter    Patient ID: Kathy Rosales, female    DOB: 1975-07-25, 41 y.o.   MRN: [id number]   Patient Care Team: Ladell Pier, MD as PCP - General (Internal Medicine) Brunetta Genera, MD as Consulting Physician (Hematology and Oncology) [place], Betty G, MD (Family Medicine)  Patient Active Problem List   Diagnosis Date Noted  . Left-sided weakness   . Unresponsive   . Hypokalemia 08/03/2016  . Chronic systolic heart failure ([place]) 05/22/2016  . Mitral regurgitation 05/22/2016  . Near syncope 05/22/2016  . Delirium due to multiple etiologies, persistent, mixed level of activity 12/13/2015  . MDD (major depressive disorder), single episode, moderate ([place]) 12/13/2015  . Opioid use disorder, mild, abuse ([place]) 12/13/2015  . UTI (urinary tract infection) 12/13/2015  . Fatigue 12/01/2015  . Orthostatic hypotension 12/01/2015  . Helicobacter positive gastritis 08/06/2015  . Gastric ulcer 08/06/2015  . Iron deficiency anemia due to chronic blood loss 08/06/2015  . Anemia 07/21/2015  . Iron deficiency anemia 10/22/2014  . B12 deficiency 10/22/2014  . Symptomatic anemia 08/28/2014  . Headache 08/28/2014  . Chest pain 08/28/2014  . 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  aspirin EC 81 MG tablet, Take 1 tablet (81 mg total) by mouth daily., Disp: 90 tablet, Rfl: 3 .  Aspirin-Salicylamide-Caffeine (BC HEADACHE PO), Take 1 packet by mouth 4 (four) times daily as needed (headaches)., Disp: , Rfl:  .  carvedilol (COREG) 6.25 MG tablet, Take 1 tablet (6.25 mg total) by mouth 2 (two) times daily., Disp: 60 tablet, Rfl: 3 .  digoxin (LANOXIN) 0.125 MG tablet, Take 1 tablet (0.125 mg total) by mouth daily., Disp: 90 tablet, Rfl: 3 .  furosemide (LASIX) 20 MG tablet, Take 2 tablets (40 mg total) by mouth daily., Disp: 180 tablet, Rfl: 3 .  losartan (COZAAR) 25 MG tablet, Take 12.5 mg by mouth daily., Disp: , Rfl:  .   PARoxetine (PAXIL) 20 MG tablet, Take 20 mg by mouth daily., Disp: , Rfl:  .  potassium chloride 20 MEQ TBCR, Take 20 mEq by mouth daily., Disp: 90 tablet, Rfl: 3 .  riboflavin (VITAMIN B-2) 100 MG TABS tablet, Take 200 mg by mouth daily., Disp: , Rfl:  .  spironolactone (ALDACTONE) 25 MG tablet, Take 12.5 mg by mouth daily., Disp: , Rfl:  .  traZODone (DESYREL) 100 MG tablet, Take 200 mg by mouth at bedtime as needed for sleep. , Disp: , Rfl:  Allergies  Allergen Reactions  . Tramadol Itching and Other (See Comments)    Red spots all over body and itching  . Furosemide Rash  . Leflunomide Rash      Social History   Socioeconomic History  . Marital status: Married    Spouse name: Not on file  . Number of children: 1  . Years of education: Not on file  . Highest education level: Not on file  Social Needs  . Financial resource strain: Not on file  . Food insecurity - worry: Not on file  . Food insecurity - inability: Not on file  . Transportation needs - medical: Not on file  . Transportation needs - non-medical: Not on file  Occupational History  . Occupation: unemployeed  Tobacco Use  . Smoking status: Former Research scientist (life sciences)  . Smokeless tobacco: Never Used  Substance and Sexual Activity  . Alcohol use: No  . Drug use: No  . Sexual activity: No    Birth control/protection: None  Other Topics Concern  . Not on file  Social History Narrative   ** Merged History Encounter **        Physical Exam  Constitutional: She is oriented to person, place, and time.  Cardiovascular: Normal rate and regular rhythm.  Pulmonary/Chest: Effort normal and breath sounds normal. No respiratory distress. She has no wheezes. She has no rales.  Abdominal: Soft. She exhibits no distension.  Musculoskeletal: Normal range of motion. She exhibits no edema.  Neurological: She is alert and oriented to person, place, and time.  Skin: Skin is warm and dry.  Psychiatric: She has a normal mood and affect.         Future Appointments  Date Time Provider [place]  03/19/2017  3:00 PM [facility] PA/NP [facility] None  04/04/2017  1:15 PM [facility] DEVICE REMOTES [facility]  05/07/2017  4:00 PM [facility] ECHO 2 [facility]    BP 120/80 (BP Location: Right Arm, Patient Position: Sitting, Cuff Size: Normal)   Pulse 76   Resp 16   Wt 138 lb (62.6 kg)   SpO2 98%   BMI 25.24 kg/m   Weight yesterday- 138 lb Last visit weight- 139 lb  Kathy Rosales was seen at home today after several weeks of not being able to reach her for an appointment. She reported having daily headaches and taking up to 3 BC Headache powders daily. She also reported that she had stopped taking all medications except furosemide, vitamin B and paroxetine. She stopped taking them because she said she was feeling tired and did not think they were helping. I explained why she needs to take each medication and that it was dangerous to her health to stop medication without consulting her physician. She said she understood and would start taking her medications as prescribed again. Her medications were verified and her pillbox was refilled.    Time spent with patient: 44 minutes  Jacquiline Doe, EMT 02/19/17  ACTION: Home visit completed Next visit planned for 1 week

## 2017-02-28 ENCOUNTER — Other Ambulatory Visit (HOSPITAL_COMMUNITY): Payer: Self-pay

## 2017-02-28 NOTE — Progress Notes (Signed)
Paramedicine Encounter    Patient ID: Kathy Rosales, female    DOB: 1975-08-18, 41 y.o.   MRN: [id number]   Patient Care Team: Kathy Pier, MD as PCP - General (Internal Medicine) Kathy Genera, MD as Consulting Physician (Hematology and Oncology) Rosales, Kathy G, MD (Family Medicine)  Patient Active Problem List   Diagnosis Date Noted  . Left-sided weakness   . Unresponsive   . Hypokalemia 08/03/2016  . Chronic systolic heart failure ([place]) 05/22/2016  . Mitral regurgitation 05/22/2016  . Near syncope 05/22/2016  . Delirium due to multiple etiologies, persistent, mixed level of activity 12/13/2015  . MDD (major depressive disorder), single episode, moderate ([place]) 12/13/2015  . Opioid use disorder, mild, abuse ([place]) 12/13/2015  . UTI (urinary tract infection) 12/13/2015  . Fatigue 12/01/2015  . Orthostatic hypotension 12/01/2015  . Helicobacter positive gastritis 08/06/2015  . Gastric ulcer 08/06/2015  . Iron deficiency anemia due to chronic blood loss 08/06/2015  . Anemia 07/21/2015  . Iron deficiency anemia 10/22/2014  . B12 deficiency 10/22/2014  . Symptomatic anemia 08/28/2014  . Headache 08/28/2014  . Chest pain 08/28/2014  . 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  aspirin EC 81 MG tablet, Take 1 tablet (81 mg total) by mouth daily., Disp: 90 tablet, Rfl: 3 .  Aspirin-Salicylamide-Caffeine (BC HEADACHE PO), Take 1 packet by mouth 4 (four) times daily as needed (headaches)., Disp: , Rfl:  .  carvedilol (COREG) 6.25 MG tablet, Take 1 tablet (6.25 mg total) by mouth 2 (two) times daily., Disp: 60 tablet, Rfl: 3 .  digoxin (LANOXIN) 0.125 MG tablet, Take 1 tablet (0.125 mg total) by mouth daily., Disp: 90 tablet, Rfl: 3 .  losartan (COZAAR) 25 MG tablet, Take 12.5 mg by mouth daily., Disp: , Rfl:  .  potassium chloride 20 MEQ TBCR, Take 20 mEq by mouth daily., Disp: 90 tablet, Rfl: 3 .  riboflavin (VITAMIN B-2)  100 MG TABS tablet, Take 200 mg by mouth daily., Disp: , Rfl:  .  spironolactone (ALDACTONE) 25 MG tablet, Take 12.5 mg by mouth daily., Disp: , Rfl:  .  furosemide (LASIX) 20 MG tablet, Take 2 tablets (40 mg total) by mouth daily. (Patient not taking: Reported on 02/28/2017), Disp: 180 tablet, Rfl: 3 .  PARoxetine (PAXIL) 20 MG tablet, Take 20 mg by mouth daily., Disp: , Rfl:  .  traZODone (DESYREL) 100 MG tablet, Take 200 mg by mouth at bedtime as needed for sleep. , Disp: , Rfl:  Allergies  Allergen Reactions  . Tramadol Itching and Other (See Comments)    Red spots all over body and itching  . Furosemide Rash  . Leflunomide Rash      Social History   Socioeconomic History  . Marital status: Married    Spouse name: Not on file  . Number of children: 1  . Years of education: Not on file  . Highest education level: Not on file  Social Needs  . Financial resource strain: Not on file  . Food insecurity - worry: Not on file  . Food insecurity - inability: Not on file  . Transportation needs - medical: Not on file  . Transportation needs - non-medical: Not on file  Occupational History  . Occupation: unemployeed  Tobacco Use  . Smoking status: Former Research scientist (life sciences)  . Smokeless tobacco: Never Used  Substance and Sexual Activity  . Alcohol use: No  . Drug use: No  . Sexual activity: No  Birth control/protection: None  Other Topics Concern  . Not on file  Social History Narrative   ** Merged History Encounter **        Physical Exam  Constitutional: She is oriented to person, place, and time.  Cardiovascular: Normal rate and regular rhythm.  Pulmonary/Chest: Effort normal and breath sounds normal. No respiratory distress. She has no wheezes. She has no rales.  Abdominal: Soft.  Musculoskeletal: Normal range of motion. She exhibits no edema.  Neurological: She is alert and oriented to person, place, and time.  Skin: Skin is warm and dry.  Psychiatric: She has a normal mood  and affect.        Future Appointments  Date Time Provider [place]  03/19/2017  3:00 PM [facility] PA/NP [facility] None  04/04/2017  1:15 PM [facility] DEVICE REMOTES [facility]  05/07/2017  4:00 PM [facility] ECHO 2 [facility]    BP 124/86 (BP Location: Right Arm, Patient Position: Sitting, Cuff Size: Normal)   Pulse 90   Resp 16   Wt 143 lb (64.9 kg)   SpO2 98%   BMI 26.16 kg/m   Weight yesterday- 141 lb Last visit weight- 138 lb  Kathy Rosales was seen at home today and reported feeling well. She denied SOB, or dizziness. She is taking her HF medications again but is out of her BH medications. She has an appointment to see the psychiatrist on Friday. She said she is drinking grape juice mostly but she is drinking approximately 2.8 liters per day which contains close to 800 mg of sodium. I explained this to her and she said she understood and would change to water. She ran out of furosemide and had not picked it up from the pharmacy which is likely the cause for her weight gain over the past week. She is also failing to transmit her defibrillator data weekly but she did not have a clear answer as to why. Medications were verified and her pillbox was refilled. Losartan was called into the pharmacy.   Time spent with patient: 52 minutes  Kathy Rosales, EMT 02/28/17  ACTION: Home visit completed Next visit planned for 1 week

## 2017-03-14 ENCOUNTER — Telehealth (HOSPITAL_COMMUNITY): Payer: Self-pay

## 2017-03-14 NOTE — Telephone Encounter (Signed)
I called Kathy Rosales to schedule an appointment but she did not answer. I left a voicemail requesting she call me so we can schedule a visit.

## 2017-03-19 ENCOUNTER — Encounter (HOSPITAL_COMMUNITY): Payer: Self-pay

## 2017-03-19 ENCOUNTER — Ambulatory Visit (HOSPITAL_COMMUNITY)
Admission: RE | Admit: 2017-03-19 | Discharge: 2017-03-19 | Disposition: A | Discharge status: Home / Self Care | Payer: 59 | Source: Ambulatory Visit | Attending: Internal Medicine | Admitting: Internal Medicine

## 2017-03-19 VITALS — BP 128/76 | HR 91 | Wt 141.6 lb

## 2017-03-19 DIAGNOSIS — Z79899 Other long term (current) drug therapy: Secondary | ICD-10-CM | POA: Insufficient documentation

## 2017-03-19 DIAGNOSIS — Z95810 Presence of automatic (implantable) cardiac defibrillator: Secondary | ICD-10-CM | POA: Diagnosis not present

## 2017-03-19 DIAGNOSIS — I428 Other cardiomyopathies: Secondary | ICD-10-CM | POA: Insufficient documentation

## 2017-03-19 DIAGNOSIS — Z87891 Personal history of nicotine dependence: Secondary | ICD-10-CM | POA: Insufficient documentation

## 2017-03-19 DIAGNOSIS — I5022 Chronic systolic (congestive) heart failure: Secondary | ICD-10-CM | POA: Diagnosis present

## 2017-03-19 DIAGNOSIS — Z7982 Long term (current) use of aspirin: Secondary | ICD-10-CM | POA: Diagnosis not present

## 2017-03-19 DIAGNOSIS — M0820 Juvenile rheumatoid arthritis with systemic onset, unspecified site: Secondary | ICD-10-CM | POA: Diagnosis not present

## 2017-03-19 DIAGNOSIS — I340 Nonrheumatic mitral (valve) insufficiency: Secondary | ICD-10-CM | POA: Insufficient documentation

## 2017-03-19 LAB — BASIC METABOLIC PANEL
Anion gap: 8 (ref 5–15)
BUN: 13 mg/dL (ref 6–20)
CO2: 24 mmol/L (ref 22–32)
Calcium: 9 mg/dL (ref 8.9–10.3)
Chloride: 106 mmol/L (ref 101–111)
Creatinine, Ser: 0.64 mg/dL (ref 0.44–1.00)
GFR calc Af Amer: >60 mL/min (ref >60)
GFR calc non Af Amer: >60 mL/min (ref >60)
Glucose, Bld: 102 mg/dL — ABNORMAL HIGH (ref 65–99)
Potassium: 3.7 mmol/L (ref 3.5–5.1)
Sodium: 138 mmol/L (ref 135–145)

## 2017-03-19 MED ORDER — SPIRONOLACTONE 25 MG PO TABS: 25.0000 mg | ORAL_TABLET | Freq: Every day | ORAL | 6 refills | Status: DC

## 2017-03-19 NOTE — Patient Instructions (Signed)
Routine lab work today. Will notify you of abnormal results, otherwise no news is good news!  INCREASE Spironolactone to 25 mg (1 whole tablet) once daily).  Follow up 4 weeks with Amy Clegg NP-C.  Take all medication as prescribed the day of your appointment. Bring all medications with you to your appointment.  Do the following things EVERYDAY: 1) Weigh yourself in the morning before breakfast. Write it down and keep it in a log. 2) Take your medicines as prescribed 3) Eat low salt foods-Limit salt (sodium) to 2000 mg per day.  4) Stay as active as you can everyday 5) Limit all fluids for the day to less than 2 liters

## 2017-03-19 NOTE — Progress Notes (Signed)
PCP: Dr. Janne Napoleon Primary Cardiologist: Dr. Stanford Breed  HF MD: Dr Aundra Dubin     Advanced Heart Failure Clinic Note   HPI:  [name] is a 41 year old female with a past medical history of NICM EF 25-30% (cath in 05/2016 with normal cors), moderate mitral regurgitation, and tricuspid regurgitation. No FH of coronary disease.   Diagnosed with CHF and  went to her PCP in January 2018 for dyspnea. Says she started getting SOB in the fall. Denies any type of viral illness. No family history of heart disease. Echo was performed and showed reduced EF 20-25% with mod-severe MR. at 25-30%. She was referred to Dr. Stanford Breed who then set her up for a left and right heart cath. Admitted over night on 2/19 after cath due to changes in LOC after cath, was hypotensive. Symptoms resolved, head CT negative. Optimization of her medications was prohibited by hypotension prior to discharge. Started on corlanor.   Had CPX 07/15/16. Results as below. Immediately after, pt sat in chair and then had a pseudoseizure. No tonic/clonic activity. Pt taken to ED with Neurology consulted. Pt recovered spontaneously recovered. CT angio chest and CT head  unremarkable.  Repeat echo was done in 4/18.  EF remains 20-25% with moderate diastolic dysfunction.   She is s/p SubQ ICD implantation 09/27/16. Admission complicated by AMS thought to be psychogenic. CT head, CTA head/neck and EEG were all negative.   Today she returns for HF follow up. Overall feeling fine. Denies SOB/PND/Orthopnea. Appetite ok. No fever or chills. Taking all medications. Wants to get plastic surgery.  Follwoed by [employer].   Labs (6/18): K 3.5, creatinine 0.71, hgb 13.1  CPX 07/18/16 Pre-Exercise PFTs  FVC 2.79 (92%)    FEV1 2.38 (93%)     FEV1/FVC 85 (101%)     MVV 48 (49%) Exercise Time:  3:15  Speed (mph): 1.5   Grade (%): 0   RPE: 17 Reason stopped: Patient ended test due to Dyspnea (8/10) Additional symptoms: lightheaded (9/10),  chest pressure (9/10) Resting HR: 84 Peak HR: 95  (53% age predicted max HR) BP rest: 92/62 BP peak: 88/60 Peak VO2: 14.1 (44% predicted peak VO2) VE/VCO2 slope: 59.4 OUES: 0.73 Peak RER: 0.96 Ventilatory Threshold: Not achieved  Peak RR 77 Peak Ventilation: 28.8 VE/MVV: 60% PETCO2 at peak: 28 O2pulse: 8  80% predicted O2pulse)  RHC/LHC 2/19/018  RA 3 PCWP  20 CO/CI 4.43/2.88 LHC normal cors.    ECHO 04/2016 EF 20-25%. Mod-Severe MR, Mod TR.  ECHO 4/18 EF 202-5%, moderate diastolic dysfunction, PASP 31 mmHg.   SH: Lives with Mom. No alcohol or drug abuse. Does not smoke cigarettes  FH: No history of CAD.   Review of systems complete and found to be negative unless listed in HPI.    SH:  Social History   Socioeconomic History  . Marital status: Married    Spouse name: Not on file  . Number of children: 1  . Years of education: Not on file  . Highest education level: Not on file  Social Needs  . Financial resource strain: Not on file  . Food insecurity - worry: Not on file  . Food insecurity - inability: Not on file  . Transportation needs - medical: Not on file  . Transportation needs - non-medical: Not on file  Occupational History  . Occupation: unemployeed  Tobacco Use  . Smoking status: Former Research scientist (life sciences)  . Smokeless tobacco: Never Used  Substance and Sexual Activity  . Alcohol use:  No  . Drug use: No  . Sexual activity: No    Birth control/protection: None  Other Topics Concern  . Not on file  Social History Narrative   ** Merged History Encounter **        FH:  Family History  Problem Relation Age of Onset  . Cancer Paternal Grandfather   . Mental illness Neg Hx     Past Medical History:  Diagnosis Date  . Acute systolic CHF (congestive heart failure) ([place]) 05/22/2016  . Blood transfusion without reported diagnosis   . 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 aspirin EC 81 MG tablet  Take 1 tablet (81 mg total) by mouth daily. 90 tablet 3  . Aspirin-Salicylamide-Caffeine (BC HEADACHE PO) Take 1 packet by mouth 4 (four) times daily as needed (headaches).    . carvedilol (COREG) 6.25 MG tablet Take 1 tablet (6.25 mg total) by mouth 2 (two) times daily. 60 tablet 3  . digoxin (LANOXIN) 0.125 MG tablet Take 1 tablet (0.125 mg total) by mouth daily. 90 tablet 3  . furosemide (LASIX) 20 MG tablet Take 2 tablets (40 mg total) by mouth daily. 180 tablet 3  . losartan (COZAAR) 25 MG tablet Take 25 mg by mouth daily.    Marland Kitchen PARoxetine (PAXIL) 20 MG tablet Take 20 mg by mouth daily.    . potassium chloride 20 MEQ TBCR Take 20 mEq by mouth daily. 90 tablet 3  . riboflavin (VITAMIN B-2) 100 MG TABS tablet Take 200 mg by mouth daily.    Marland Kitchen spironolactone (ALDACTONE) 25 MG tablet Take 12.5 mg by mouth daily.    . traZODone (DESYREL) 100 MG tablet Take 200 mg by mouth at bedtime as needed for sleep.     Marland Kitchen losartan (COZAAR) 25 MG tablet Take 12.5 mg by mouth daily.     No current facility-administered medications for this encounter.     Vitals:   03/19/17 1526  BP: 128/76  Pulse: 91  SpO2: 99%  Weight: 141 lb 9.6 oz (64.2 kg)   Wt Readings from Last 3 Encounters:  03/19/17 141 lb 9.6 oz (64.2 kg)  02/28/17 143 lb (64.9 kg)  02/19/17 138 lb (62.6 kg)    PHYSICAL EXAM: Interpreter Present  General:  Well appearing. No resp difficulty HEENT: normal Neck: supple. no JVD. Carotids 2+ bilat; no bruits. No lymphadenopathy or thryomegaly appreciated. Cor: PMI nondisplaced. Regular rate & rhythm. No rubs, gallops or murmurs. Lungs: clear Abdomen: soft, nontender, nondistended. No hepatosplenomegaly. No bruits or masses. Good bowel sounds. Extremities: no cyanosis, clubbing, rash, edema Neuro: alert & orientedx3, cranial nerves grossly intact. moves all 4 extremities w/o difficulty. Affect pleasant  ASSESSMENT & PLAN: 1. Chronic Systolic Heart Failure: Nonischemic cardiomyopathy,  ?viral cardiomyopathy. LHC 05/2016 no coronary disease. Echo 05/2016 EF 20-25% mod MR, repeat echo 07/21/16 LVEF 20-25%.  [place].   NYHA II. Doing well. Volume status stable. Continue lasix 40 mg daily - Continue Coreg 6.25 mg BID -Continue losartan 25 mg daily Consider entresto at the next visit.  - Increase spiro to 25 mg daily.  - Continue digoxin.  -Check BMET today.  2. Mitral Regurgitation:  - Mild by Echo in April 2018.   3. Atypical chest pain: -resolved.    Follow up in 4 weeks. Interpreter will continue at all appointments. Continue Paramedicine for now.    Darrick Grinder, NP  03/19/2017 3:33 PM

## 2017-03-22 ENCOUNTER — Telehealth (HOSPITAL_COMMUNITY): Payer: Self-pay

## 2017-03-22 NOTE — Telephone Encounter (Signed)
I called Kathy Rosales to schedule an appointment. She did not answer so I left a voicemail requesting she call me back with a date and time she can meet. She has not been responsive for the last two weeks and went nearly a month without returning my phone calls before that. It may be worth exploring discharge of she does not respond within the next week.

## 2017-03-30 ENCOUNTER — Telehealth (HOSPITAL_COMMUNITY): Payer: Self-pay

## 2017-03-30 NOTE — Telephone Encounter (Signed)
I called Kathy Rosales to schedule an appointment. She said she would be available to meet at 17:00 today and asked I come then.

## 2017-04-02 ENCOUNTER — Other Ambulatory Visit (HOSPITAL_COMMUNITY): Payer: Self-pay

## 2017-04-02 NOTE — Progress Notes (Signed)
Paramedicine Encounter    Patient ID: Kathy Rosales, female    DOB: 03/27/1976, 41 y.o.   MRN: [id number]   Patient Care Team: Ladell Pier, MD as PCP - General (Internal Medicine) Brunetta Genera, MD as Consulting Physician (Hematology and Oncology) Rosales, Kathy G, MD (Family Medicine)  Patient Active Problem List   Diagnosis Date Noted  . Left-sided weakness   . Unresponsive   . Hypokalemia 08/03/2016  . Chronic systolic heart failure ([place]) 05/22/2016  . Mitral regurgitation 05/22/2016  . Near syncope 05/22/2016  . Delirium due to multiple etiologies, persistent, mixed level of activity 12/13/2015  . MDD (major depressive disorder), single episode, moderate ([place]) 12/13/2015  . Opioid use disorder, mild, abuse ([place]) 12/13/2015  . UTI (urinary tract infection) 12/13/2015  . Fatigue 12/01/2015  . Orthostatic hypotension 12/01/2015  . Helicobacter positive gastritis 08/06/2015  . Gastric ulcer 08/06/2015  . Iron deficiency anemia due to chronic blood loss 08/06/2015  . Anemia 07/21/2015  . Iron deficiency anemia 10/22/2014  . B12 deficiency 10/22/2014  . Symptomatic anemia 08/28/2014  . Headache 08/28/2014  . Chest pain 08/28/2014  . 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  aspirin EC 81 MG tablet, Take 1 tablet (81 mg total) by mouth daily., Disp: 90 tablet, Rfl: 3 .  Aspirin-Salicylamide-Caffeine (BC HEADACHE PO), Take 1 packet by mouth 4 (four) times daily as needed (headaches)., Disp: , Rfl:  .  carvedilol (COREG) 6.25 MG tablet, Take 1 tablet (6.25 mg total) by mouth 2 (two) times daily., Disp: 60 tablet, Rfl: 3 .  digoxin (LANOXIN) 0.125 MG tablet, Take 1 tablet (0.125 mg total) by mouth daily., Disp: 90 tablet, Rfl: 3 .  furosemide (LASIX) 20 MG tablet, Take 2 tablets (40 mg total) by mouth daily., Disp: 180 tablet, Rfl: 3 .  PARoxetine (PAXIL) 20 MG tablet, Take 20 mg by mouth daily., Disp: , Rfl:  .   potassium chloride 20 MEQ TBCR, Take 20 mEq by mouth daily., Disp: 90 tablet, Rfl: 3 .  riboflavin (VITAMIN B-2) 100 MG TABS tablet, Take 200 mg by mouth daily., Disp: , Rfl:  .  spironolactone (ALDACTONE) 25 MG tablet, Take 1 tablet (25 mg total) by mouth daily., Disp: 30 tablet, Rfl: 6 .  traZODone (DESYREL) 100 MG tablet, Take 200 mg by mouth at bedtime as needed for sleep. , Disp: , Rfl:  .  losartan (COZAAR) 25 MG tablet, Take 12.5 mg by mouth daily., Disp: , Rfl:  .  losartan (COZAAR) 25 MG tablet, Take 25 mg by mouth daily., Disp: , Rfl:  Allergies  Allergen Reactions  . Tramadol Itching and Other (See Comments)    Red spots all over body and itching  . Furosemide Rash  . Leflunomide Rash      Social History   Socioeconomic History  . Marital status: Married    Spouse name: Not on file  . Number of children: 1  . Years of education: Not on file  . Highest education level: Not on file  Social Needs  . Financial resource strain: Not on file  . Food insecurity - worry: Not on file  . Food insecurity - inability: Not on file  . Transportation needs - medical: Not on file  . Transportation needs - non-medical: Not on file  Occupational History  . Occupation: unemployeed  Tobacco Use  . Smoking status: Former Research scientist (life sciences)  . Smokeless tobacco: Never Used  Substance and Sexual Activity  .  Alcohol use: No  . Drug use: No  . Sexual activity: No    Birth control/protection: None  Other Topics Concern  . Not on file  Social History Narrative   ** Merged History Encounter **        Physical Exam  Constitutional: She is oriented to person, place, and time.  Cardiovascular: Normal rate and regular rhythm.  Pulmonary/Chest: Effort normal and breath sounds normal. No respiratory distress. She has no wheezes. She has no rales.  Abdominal: Soft. She exhibits no distension.  Musculoskeletal: Normal range of motion. She exhibits no edema.  Neurological: She is alert and oriented to  person, place, and time.  Skin: Skin is warm and dry.  Psychiatric: She has a normal mood and affect.        Future Appointments  Date Time Provider [place]  04/04/2017  1:15 PM [facility] DEVICE REMOTES [facility]  04/06/2017  7:20 AM GI-BCG MM 2 GI-BCGMM GI-BREAST CE  04/16/2017  3:30 PM MC-[facility] PA/NP [facility] None  05/07/2017  4:00 PM MC-CV CH ECHO 5 [facility]CHO [facility]    BP 110/80 (BP Location: Right Arm, Patient Position: Sitting, Cuff Size: Normal)   Pulse 78   Resp 16   Wt 140 lb (63.5 kg)   SpO2 98%   BMI 25.61 kg/m   Weight yesterday- 139 lb Last visit weight- 141 lb  [name] was seen at home today and reported feeling generally well. She denied SOB, dizziness and orthopnea but did say she has been having frequent headaches. She has been taking her medications but has not been able to fill losartan for the past two weeks. She remains confused about the nature of her heart failure despite multiple attempts to explain this. She reported the electrophysiologist told her that she could be able to have her defibrillator removed if her heart function improved enough and asked if I thought it was possible. I let her know I am not qualified to answer that question and she should bring it up at her next appointment. Her medications were verified and her pillbox was refilled.   Time spent with patient: 55 minutes  Jacquiline Doe, EMT 04/02/17  ACTION: Home visit completed Next visit planned for 2 weeks

## 2017-04-04 ENCOUNTER — Telehealth: Payer: Self-pay | Admitting: Cardiology

## 2017-04-04 ENCOUNTER — Encounter: Payer: 59 | Admitting: *Deleted

## 2017-04-04 ENCOUNTER — Other Ambulatory Visit (HOSPITAL_COMMUNITY): Payer: Self-pay | Admitting: Pharmacist

## 2017-04-04 MED ORDER — LOSARTAN POTASSIUM 25 MG PO TABS: 25.0000 mg | ORAL_TABLET | Freq: Every day | ORAL | 5 refills | Status: DC

## 2017-04-04 NOTE — Telephone Encounter (Signed)
LMOVM reminding pt to send remote transmission.   

## 2017-04-06 ENCOUNTER — Encounter: Payer: Self-pay | Admitting: Cardiology

## 2017-04-06 ENCOUNTER — Ambulatory Visit
Admission: RE | Admit: 2017-04-06 | Discharge: 2017-04-06 | Disposition: A | Discharge status: Home / Self Care | Payer: 59 | Source: Ambulatory Visit | Attending: Internal Medicine | Admitting: Internal Medicine

## 2017-04-06 DIAGNOSIS — Z1239 Encounter for other screening for malignant neoplasm of breast: Secondary | ICD-10-CM

## 2017-04-09 ENCOUNTER — Ambulatory Visit: Admission: RE | Admit: 2017-04-09 | Payer: Self-pay | Source: Ambulatory Visit

## 2017-04-09 ENCOUNTER — Other Ambulatory Visit: Payer: Self-pay | Admitting: Internal Medicine

## 2017-04-09 ENCOUNTER — Ambulatory Visit
Admission: RE | Admit: 2017-04-09 | Discharge: 2017-04-09 | Disposition: A | Discharge status: Home / Self Care | Payer: 59 | Source: Ambulatory Visit | Attending: Internal Medicine | Admitting: Internal Medicine

## 2017-04-09 DIAGNOSIS — R928 Other abnormal and inconclusive findings on diagnostic imaging of breast: Secondary | ICD-10-CM

## 2017-04-10 ENCOUNTER — Other Ambulatory Visit: Payer: Self-pay

## 2017-04-11 ENCOUNTER — Other Ambulatory Visit (HOSPITAL_COMMUNITY): Payer: Self-pay

## 2017-04-11 NOTE — Progress Notes (Signed)
Paramedicine Encounter    Patient ID: Kathy Rosales, female    DOB: October 19, 1975, 42 y.o.   MRN: [id number]   Patient Care Team: Ladell Pier, MD as PCP - General (Internal Medicine) Brunetta Genera, MD as Consulting Physician (Hematology and Oncology) [place], Betty G, MD (Family Medicine)  Patient Active Problem List   Diagnosis Date Noted  . Left-sided weakness   . Unresponsive   . Hypokalemia 08/03/2016  . Chronic systolic heart failure ([place]) 05/22/2016  . Mitral regurgitation 05/22/2016  . Near syncope 05/22/2016  . Delirium due to multiple etiologies, persistent, mixed level of activity 12/13/2015  . MDD (major depressive disorder), single episode, moderate ([place]) 12/13/2015  . Opioid use disorder, mild, abuse ([place]) 12/13/2015  . UTI (urinary tract infection) 12/13/2015  . Fatigue 12/01/2015  . Orthostatic hypotension 12/01/2015  . Helicobacter positive gastritis 08/06/2015  . Gastric ulcer 08/06/2015  . Iron deficiency anemia due to chronic blood loss 08/06/2015  . Anemia 07/21/2015  . Iron deficiency anemia 10/22/2014  . B12 deficiency 10/22/2014  . Symptomatic anemia 08/28/2014  . Headache 08/28/2014  . Chest pain 08/28/2014  . 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  aspirin EC 81 MG tablet, Take 1 tablet (81 mg total) by mouth daily., Disp: 90 tablet, Rfl: 3 .  Aspirin-Salicylamide-Caffeine (BC HEADACHE PO), Take 1 packet by mouth 4 (four) times daily as needed (headaches)., Disp: , Rfl:  .  carvedilol (COREG) 6.25 MG tablet, Take 1 tablet (6.25 mg total) by mouth 2 (two) times daily., Disp: 60 tablet, Rfl: 3 .  digoxin (LANOXIN) 0.125 MG tablet, Take 1 tablet (0.125 mg total) by mouth daily., Disp: 90 tablet, Rfl: 3 .  furosemide (LASIX) 20 MG tablet, Take 2 tablets (40 mg total) by mouth daily., Disp: 180 tablet, Rfl: 3 .  PARoxetine (PAXIL) 20 MG tablet, Take 20 mg by mouth daily., Disp: , Rfl:  .   potassium chloride 20 MEQ TBCR, Take 20 mEq by mouth daily., Disp: 90 tablet, Rfl: 3 .  riboflavin (VITAMIN B-2) 100 MG TABS tablet, Take 200 mg by mouth daily., Disp: , Rfl:  .  spironolactone (ALDACTONE) 25 MG tablet, Take 1 tablet (25 mg total) by mouth daily., Disp: 30 tablet, Rfl: 6 .  traZODone (DESYREL) 100 MG tablet, Take 200 mg by mouth at bedtime as needed for sleep. , Disp: , Rfl:  .  losartan (COZAAR) 25 MG tablet, Take 1 tablet (25 mg total) by mouth daily. (Patient not taking: Reported on 04/11/2017), Disp: 30 tablet, Rfl: 5 Allergies  Allergen Reactions  . Tramadol Itching and Other (See Comments)    Red spots all over body and itching  . Furosemide Rash  . Leflunomide Rash      Social History   Socioeconomic History  . Marital status: Married    Spouse name: Not on file  . Number of children: 1  . Years of education: Not on file  . Highest education level: Not on file  Social Needs  . Financial resource strain: Not on file  . Food insecurity - worry: Not on file  . Food insecurity - inability: Not on file  . Transportation needs - medical: Not on file  . Transportation needs - non-medical: Not on file  Occupational History  . Occupation: unemployeed  Tobacco Use  . Smoking status: Former Research scientist (life sciences)  . Smokeless tobacco: Never Used  Substance and Sexual Activity  . Alcohol use: No  .  Drug use: No  . Sexual activity: No    Birth control/protection: None  Other Topics Concern  . Not on file  Social History Narrative   ** Merged History Encounter **        Physical Exam  Constitutional: She is oriented to person, place, and time.  Cardiovascular: Normal rate and regular rhythm.  Pulmonary/Chest: Effort normal and breath sounds normal. No respiratory distress. She has no wheezes. She has no rales.  Abdominal: Soft.  Musculoskeletal: Normal range of motion. She exhibits no edema.  Neurological: She is alert and oriented to person, place, and time.  Skin: Skin  is warm and dry.  Psychiatric: She has a normal mood and affect.        Future Appointments  Date Time Provider [place]  04/16/2017  3:30 PM [facility] PA/NP [facility] None  05/07/2017  4:00 PM [facility]-CV CH ECHO 5 [facility]    BP 110/88 (BP Location: Left Arm, Patient Position: Sitting, Cuff Size: Normal)   Pulse 96   Resp 16   Wt 144 lb (65.3 kg)   SpO2 98%   BMI 26.34 kg/m   Weight yesterday- 144 lb Last visit weight- 140 lb   Kathy Rosales was seen at home today and reported feeling well. She reported feeling some dizziness which is persistent. She said she has not been able to get the losartan filled and also ran out of lasix but has enough in her pillbox for the next several days. Her medications were verified and her pillbox was refilled.   Time spent with patient: 30 minutes  Kathy Rosales, EMT 04/11/17  ACTION: Home visit completed Next visit planned for 1 week

## 2017-04-12 ENCOUNTER — Telehealth: Payer: Self-pay | Admitting: Cardiology

## 2017-04-12 NOTE — Telephone Encounter (Signed)
Spoke with Audelia Hives, he states that he is "just returning a call". Informed that I do not see a message and to ask pt what she thinks who called or message that was left. He will call back after discussing w/pt

## 2017-04-12 NOTE — Telephone Encounter (Signed)
F/u Message  Mr. Kathy Rosales call states he was returning RN call

## 2017-04-16 ENCOUNTER — Encounter (HOSPITAL_COMMUNITY): Payer: Self-pay

## 2017-04-25 ENCOUNTER — Ambulatory Visit (HOSPITAL_COMMUNITY)
Admission: RE | Admit: 2017-04-25 | Discharge: 2017-04-25 | Disposition: A | Discharge status: Home / Self Care | Payer: 59 | Source: Ambulatory Visit | Attending: Internal Medicine | Admitting: Internal Medicine

## 2017-04-25 ENCOUNTER — Encounter (HOSPITAL_COMMUNITY): Payer: Self-pay

## 2017-04-25 VITALS — BP 126/88 | HR 72 | Wt 145.2 lb

## 2017-04-25 DIAGNOSIS — Z79899 Other long term (current) drug therapy: Secondary | ICD-10-CM | POA: Insufficient documentation

## 2017-04-25 DIAGNOSIS — I5022 Chronic systolic (congestive) heart failure: Secondary | ICD-10-CM

## 2017-04-25 DIAGNOSIS — I429 Cardiomyopathy, unspecified: Secondary | ICD-10-CM | POA: Diagnosis present

## 2017-04-25 DIAGNOSIS — R0789 Other chest pain: Secondary | ICD-10-CM | POA: Insufficient documentation

## 2017-04-25 DIAGNOSIS — I081 Rheumatic disorders of both mitral and tricuspid valves: Secondary | ICD-10-CM | POA: Diagnosis not present

## 2017-04-25 DIAGNOSIS — Z7982 Long term (current) use of aspirin: Secondary | ICD-10-CM | POA: Diagnosis not present

## 2017-04-25 DIAGNOSIS — Z87891 Personal history of nicotine dependence: Secondary | ICD-10-CM | POA: Diagnosis not present

## 2017-04-25 DIAGNOSIS — R519 Headache, unspecified: Secondary | ICD-10-CM

## 2017-04-25 DIAGNOSIS — I340 Nonrheumatic mitral (valve) insufficiency: Secondary | ICD-10-CM | POA: Diagnosis not present

## 2017-04-25 DIAGNOSIS — R51 Headache: Secondary | ICD-10-CM | POA: Insufficient documentation

## 2017-04-25 LAB — DIGOXIN LEVEL: Digoxin Level: <0.2 ng/mL — ABNORMAL LOW (ref 0.8–2.0)

## 2017-04-25 LAB — BASIC METABOLIC PANEL
Anion gap: 11 (ref 5–15)
BUN: 11 mg/dL (ref 6–20)
CO2: 22 mmol/L (ref 22–32)
Calcium: 8.7 mg/dL — ABNORMAL LOW (ref 8.9–10.3)
Chloride: 107 mmol/L (ref 101–111)
Creatinine, Ser: 0.63 mg/dL (ref 0.44–1.00)
GFR calc Af Amer: >60 mL/min (ref >60)
GFR calc non Af Amer: >60 mL/min (ref >60)
Glucose, Bld: 92 mg/dL (ref 65–99)
Potassium: 3.7 mmol/L (ref 3.5–5.1)
Sodium: 140 mmol/L (ref 135–145)

## 2017-04-25 MED ORDER — SACUBITRIL-VALSARTAN 24-26 MG PO TABS: 1.0000 | ORAL_TABLET | Freq: Two times a day (BID) | ORAL | 11 refills | Status: DC

## 2017-04-25 NOTE — Progress Notes (Signed)
PCP: Dr. Janne Napoleon Primary Cardiologist: Dr. Stanford Breed  HF MD: Dr Aundra Dubin     Advanced Heart Failure Clinic Note   HPI:  Kathy Rosales is a 42 year old female with a past medical history of NICM EF 25-30% (cath in 05/2016 with normal cors), moderate mitral regurgitation, and tricuspid regurgitation. No FH of coronary disease.   Diagnosed with CHF and  went to her PCP in January 2018 for dyspnea. Says she started getting SOB in the fall. Denies any type of viral illness. No family history of heart disease. Echo was performed and showed reduced EF 20-25% with mod-severe MR. at 25-30%. She was referred to Dr. Stanford Breed who then set her up for a left and right heart cath. Admitted over night on 2/19 after cath due to changes in LOC after cath, was hypotensive. Symptoms resolved, head CT negative. Optimization of her medications was prohibited by hypotension prior to discharge. Started on corlanor.   Had CPX 07/15/16. Results as below. Immediately after, pt sat in chair and then had a pseudoseizure. No tonic/clonic activity. Pt taken to ED with Neurology consulted. Pt recovered spontaneously recovered. CT angio chest and CT head  unremarkable.  Repeat echo was done in 4/18.  EF remains 20-25% with moderate diastolic dysfunction.   She is s/p SubQ ICD implantation 09/27/16. Admission complicated by AMS thought to be psychogenic. CT head, CTA head/neck and EEG were all negative.   Today she returns for regular follow up. Overall feeling OK. She had been out of losartan for several days but has been back on for several weeks. She denies SOB/PND or Orthopnea.  Appetite stable. She has had headaches for about a week. Describes as a tension type headache. She has been taking goody powders. Denies any neurological symptoms, specifically blurred vision, weakness, or slurred speech. Fluid stable.   Labs (6/18): K 3.5, creatinine 0.71, hgb 13.1  CPX 07/18/16 Pre-Exercise PFTs  FVC 2.79 (92%)    FEV1 2.38 (93%)      FEV1/FVC 85 (101%)     MVV 48 (49%) Exercise Time:  3:15  Speed (mph): 1.5   Grade (%): 0   RPE: 17 Reason stopped: Patient ended test due to Dyspnea (8/10) Additional symptoms: lightheaded (9/10), chest pressure (9/10) Resting HR: 84 Peak HR: 95  (53% age predicted max HR) BP rest: 92/62 BP peak: 88/60 Peak VO2: 14.1 (44% predicted peak VO2) VE/VCO2 slope: 59.4 OUES: 0.73 Peak RER: 0.96 Ventilatory Threshold: Not achieved  Peak RR 77 Peak Ventilation: 28.8 VE/MVV: 60% PETCO2 at peak: 28 O2pulse: 8  80% predicted O2pulse)  RHC/LHC 2/19/018  RA 3 PCWP  20 CO/CI 4.43/2.88 LHC normal cors.   ECHO 04/2016 EF 20-25%. Mod-Severe MR, Mod TR.  ECHO 4/18 EF 202-5%, moderate diastolic dysfunction, PASP 31 mmHg.   SH: Lives with Mom. No alcohol or drug abuse. Does not smoke cigarettes  FH: No history of CAD.   Review of systems complete and found to be negative unless listed in HPI.    SH:  Social History   Socioeconomic History  . Marital status: Married    Spouse name: Not on file  . Number of children: 1  . Years of education: Not on file  . Highest education level: Not on file  Social Needs  . Financial resource strain: Not on file  . Food insecurity - worry: Not on file  . Food insecurity - inability: Not on file  . Transportation needs - medical: Not on file  . Transportation  needs - non-medical: Not on file  Occupational History  . Occupation: unemployeed  Tobacco Use  . Smoking status: Former Research scientist (life sciences)  . Smokeless tobacco: Never Used  Substance and Sexual Activity  . Alcohol use: No  . Drug use: No  . Sexual activity: No    Birth control/protection: None  Other Topics Concern  . Not on file  Social History Narrative   ** Merged History Encounter **        FH:  Family History  Problem Relation Age of Onset  . Cancer Paternal Grandfather   . Mental illness Neg Hx     Past Medical History:  Diagnosis Date  . Acute systolic CHF  (congestive heart failure) ([place]) 05/22/2016  . Blood transfusion without reported diagnosis   . 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 aspirin EC 81 MG tablet Take 1 tablet (81 mg total) by mouth daily. 90 tablet 3  . Aspirin-Salicylamide-Caffeine (BC HEADACHE PO) Take 1 packet by mouth 4 (four) times daily as needed (headaches).    . carvedilol (COREG) 6.25 MG tablet Take 1 tablet (6.25 mg total) by mouth 2 (two) times daily. 60 tablet 3  . digoxin (LANOXIN) 0.125 MG tablet Take 1 tablet (0.125 mg total) by mouth daily. 90 tablet 3  . furosemide (LASIX) 20 MG tablet Take 2 tablets (40 mg total) by mouth daily. 180 tablet 3  . losartan (COZAAR) 25 MG tablet Take 1 tablet (25 mg total) by mouth daily. 30 tablet 5  . PARoxetine (PAXIL) 20 MG tablet Take 20 mg by mouth daily.    . potassium chloride 20 MEQ TBCR Take 20 mEq by mouth daily. 90 tablet 3  . riboflavin (VITAMIN B-2) 100 MG TABS tablet Take 200 mg by mouth daily.    Marland Kitchen spironolactone (ALDACTONE) 25 MG tablet Take 1 tablet (25 mg total) by mouth daily. 30 tablet 6  . traZODone (DESYREL) 100 MG tablet Take 200 mg by mouth at bedtime as needed for sleep.      No current facility-administered medications for this encounter.     Vitals:   04/25/17 0914  BP: 126/88  Pulse: 72  SpO2: 99%  Weight: 145 lb 3.2 oz (65.9 kg)   Wt Readings from Last 3 Encounters:  04/25/17 145 lb 3.2 oz (65.9 kg)  04/11/17 144 lb (65.3 kg)  04/02/17 140 lb (63.5 kg)    PHYSICAL EXAM: Interpreter present General: Well appearing. No resp difficulty. HEENT: Normal Neck: Supple. JVP 7-8 cm. Carotids 2+ bilat; no bruits. No thyromegaly or nodule noted. Cor: PMI nondisplaced. RRR, No M/G/R noted Lungs: CTAB, normal effort. Abdomen: Soft, non-tender, non-distended, no HSM. No bruits or masses. +BS  Extremities: No cyanosis, clubbing, or rash. R and LLE no edema.  Neuro: Alert & orientedx3, cranial  nerves grossly intact. moves all 4 extremities w/o difficulty. Affect pleasant   ASSESSMENT & PLAN: 1. Chronic Systolic Heart Failure: Nonischemic cardiomyopathy, ?viral cardiomyopathy. LHC 05/2016 no coronary disease. Echo 05/2016 EF 20-25% mod MR, repeat echo 07/21/16 LVEF 20-25%.  [place] ICD.   - NYHA II - Volume status stable on lasix 40 mg daily.  - Continue Coreg 6.25 mg BID - Stop losartan. Start Entresto 24/26 mg BID.  - Continue spiro 25 mg daily.  - Continue digoxin 0.125 mg daily. Check level today.  - Reinforced fluid restriction to < 2 L daily, sodium restriction to less than 2000 mg daily, and the  importance of daily weights.   2. Mitral Regurgitation:  - Mild by Echo in April 2018.   3. Atypical chest pain: - No further  4. Tension HAs - No recent med changes. Recommended she use tylenol.   Meds and labs as above. Repeat BMET 7-10 days with switch to [facility]. Continue paramedicine.   Shirley Friar, PA-C  04/25/2017 9:19 AM   Greater than 50% of the 25 minute visit was spent in counseling/coordination of care regarding disease state education, salt/fluid restriction, sliding scale diuretics, and medication compliance.

## 2017-04-25 NOTE — Patient Instructions (Signed)
STOP Losartan.  START Entresto 24/26 mg tablet twice daily  Routine lab work today. Will notify you of abnormal results, otherwise no news is good news!  Return for labs in 10 days.  Follow up with Oda Kilts PA-C in 4 weeks.  Take all medication as prescribed the day of your appointment. Bring all medications with you to your appointment.  Do the following things EVERYDAY: 1) Weigh yourself in the morning before breakfast. Write it down and keep it in a log. 2) Take your medicines as prescribed 3) Eat low salt foods-Limit salt (sodium) to 2000 mg per day.  4) Stay as active as you can everyday 5) Limit all fluids for the day to less than 2 liters

## 2017-04-26 ENCOUNTER — Encounter (HOSPITAL_COMMUNITY): Payer: Self-pay

## 2017-04-27 ENCOUNTER — Other Ambulatory Visit: Payer: Self-pay | Admitting: Internal Medicine

## 2017-04-27 MED ORDER — SACUBITRIL-VALSARTAN 24-26 MG PO TABS: 1.0000 | ORAL_TABLET | Freq: Two times a day (BID) | ORAL | 11 refills | Status: DC

## 2017-04-29 ENCOUNTER — Encounter (HOSPITAL_COMMUNITY): Payer: Self-pay | Admitting: Emergency Medicine

## 2017-04-29 ENCOUNTER — Emergency Department (HOSPITAL_COMMUNITY)
Admission: EM | Admit: 2017-04-29 | Discharge: 2017-04-29 | Disposition: A | Discharge status: Home / Self Care | Payer: 59 | Attending: Emergency Medicine | Admitting: Emergency Medicine

## 2017-04-29 DIAGNOSIS — Z87891 Personal history of nicotine dependence: Secondary | ICD-10-CM | POA: Diagnosis not present

## 2017-04-29 DIAGNOSIS — Z7982 Long term (current) use of aspirin: Secondary | ICD-10-CM | POA: Insufficient documentation

## 2017-04-29 DIAGNOSIS — Z79899 Other long term (current) drug therapy: Secondary | ICD-10-CM | POA: Insufficient documentation

## 2017-04-29 DIAGNOSIS — H5789 Other specified disorders of eye and adnexa: Secondary | ICD-10-CM | POA: Diagnosis not present

## 2017-04-29 DIAGNOSIS — I5022 Chronic systolic (congestive) heart failure: Secondary | ICD-10-CM | POA: Insufficient documentation

## 2017-04-29 MED ORDER — LORATADINE 10 MG PO TABS
10.0000 mg | ORAL_TABLET | Freq: Every day | ORAL | Status: DC
  Administered 2017-04-29: 10 mg via ORAL
  Filled 2017-04-29: qty 1

## 2017-04-29 MED ORDER — ARTIFICIAL TEARS OPHTHALMIC OINT
TOPICAL_OINTMENT | Freq: Once | OPHTHALMIC | Status: AC
  Administered 2017-04-29: 1 via OPHTHALMIC
  Filled 2017-04-29: qty 3.5

## 2017-04-29 MED ORDER — ARTIFICIAL TEARS OPHTHALMIC OINT: TOPICAL_OINTMENT | OPHTHALMIC | 0 refills | Status: DC | PRN

## 2017-04-29 NOTE — ED Triage Notes (Addendum)
Patient here from home with complaints of bilateral eye redness and swelling. Reports that she had false eyelashes applied that started to irritate eyes yesterday. Eye drops with no relief. Denies vision changes.

## 2017-04-29 NOTE — ED Provider Notes (Signed)
[facility] DEPT Provider Note   CSN: [id number] Arrival date & time: 04/29/17  1307     History   Chief Complaint Chief Complaint  Patient presents with  . Eye Problem    HPI Kathy Rosales is a 42 y.o. female with past medical history of congestive heart failure presenting with bilateral scleral injection and previous burning sensation after artificial eyelashes were applied 2 days ago.  Reports that she immediately started to feel symptoms of burning after the application.  She went back the next day to have them removed and states that although the pain has now improved the scleral injection appeared to be worsening and now she is experiencing some itching.  She denies any pain with eye movement, visual changes, foreign body sensation, fever, chills or other symptoms.  Patient is not a contact lens wearer.  HPI  Past Medical History:  Diagnosis Date  . Acute systolic CHF (congestive heart failure) ([place]) 05/22/2016  . Blood transfusion without reported diagnosis   . 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 Left-sided weakness   . Unresponsive   . Hypokalemia 08/03/2016  . Chronic systolic heart failure ([place]) 05/22/2016  . Mitral regurgitation 05/22/2016  . Near syncope 05/22/2016  . Delirium due to multiple etiologies, persistent, mixed level of activity 12/13/2015  . MDD (major depressive disorder), single episode, moderate ([place]) 12/13/2015  . Opioid use disorder, mild, abuse ([place]) 12/13/2015  . UTI (urinary tract infection) 12/13/2015  . Fatigue 12/01/2015  . Orthostatic hypotension 12/01/2015  . Helicobacter positive gastritis 08/06/2015  . Gastric ulcer 08/06/2015  . Iron deficiency anemia due to chronic blood loss 08/06/2015  . Anemia 07/21/2015  . Iron deficiency anemia 10/22/2014  . B12 deficiency 10/22/2014  . Symptomatic anemia 08/28/2014  . Headache 08/28/2014  . Chest  pain 08/28/2014  . 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 COLONOSCOPY WITH PROPOFOL N/A 07/23/2015   Procedure: COLONOSCOPY WITH PROPOFOL;  Surgeon: Ronald Lobo, MD;  Location: [facility];  Service: Endoscopy;  Laterality: N/A;  . ESOPHAGOGASTRODUODENOSCOPY (EGD) WITH PROPOFOL N/A 07/23/2015   Procedure: ESOPHAGOGASTRODUODENOSCOPY (EGD) WITH PROPOFOL;  Surgeon: Ronald Lobo, MD;  Location: [facility];  Service: Endoscopy;  Laterality: N/A;  . ESOPHAGOGASTRODUODENOSCOPY (EGD) WITH PROPOFOL N/A 11/18/2015   Procedure: ESOPHAGOGASTRODUODENOSCOPY (EGD) WITH PROPOFOL;  Surgeon: Ronald Lobo, MD;  Location: [facility];  Service: Endoscopy;  Laterality: N/A;  . RIGHT/LEFT HEART CATH AND CORONARY ANGIOGRAPHY N/A 05/22/2016   Procedure: Right/Left Heart Cath and Coronary Angiography;  Surgeon: Peter M Martinique, MD;  Location: [facility];  Service: Cardiovascular;  Laterality: N/A;  . SUBQ ICD IMPLANT N/A 09/27/2016   Procedure: SubQ ICD Implant;  Surgeon: Evans Lance, MD;  Location: [facility];  Service: Cardiovascular;  Laterality: N/A;    OB History    No data available       Home Medications    Prior to Admission medications   Medication Sig Start Date End Date Taking? Authorizing Provider  artificial tears (LACRILUBE) OINT ophthalmic ointment Place into both eyes every 4 (four) hours as needed for dry eyes. 04/29/17   Emeline General, PA-C  aspirin EC 81 MG tablet Take 1 tablet (81 mg total) by mouth daily. 05/15/16   Lelon Perla, MD  Aspirin-Salicylamide-Caffeine (BC HEADACHE PO) Take 1 packet by mouth 4 (four) times daily as needed (  headaches).    [provider]  carvedilol (COREG) 6.25 MG tablet Take 1 tablet (6.25 mg total) by mouth 2 (two) times daily. 09/25/16 04/25/17  Larey Dresser, MD  digoxin (LANOXIN) 0.125 MG tablet Take 1 tablet (0.125 mg total) by mouth daily. 06/09/16   Lelon Perla, MD  furosemide (LASIX) 20 MG tablet Take 2 tablets (40 mg total) by mouth daily. 06/09/16   Lelon Perla, MD  PARoxetine (PAXIL) 20 MG tablet Take 20 mg by mouth daily.    [provider]  potassium chloride 20 MEQ TBCR Take 20 mEq by mouth daily. 06/01/16   Clegg, Amy D, NP  riboflavin (VITAMIN B-2) 100 MG TABS tablet Take 200 mg by mouth daily.    [provider]  sacubitril-valsartan (ENTRESTO) 24-26 MG Take 1 tablet by mouth 2 (two) times daily. 04/27/17   Ladell Pier, MD  spironolactone (ALDACTONE) 25 MG tablet Take 1 tablet (25 mg total) by mouth daily. 03/19/17   Clegg, Amy D, NP  traZODone (DESYREL) 100 MG tablet Take 200 mg by mouth at bedtime as needed for sleep.     [provider]    Family History Family History  Problem Relation Age of Onset  . Cancer Paternal Grandfather   . Mental illness Neg Hx     Social History Social History   Tobacco Use  . Smoking status: Former Research scientist (life sciences)  . Smokeless tobacco: Never Used  Substance Use Topics  . Alcohol use: No  . Drug use: No     Allergies   Tramadol; Furosemide; and Leflunomide   Review of Systems Review of Systems  Constitutional: Negative for chills and fever.  HENT: Negative for facial swelling, sore throat and trouble swallowing.   Eyes: Positive for redness and itching. Negative for photophobia, pain, discharge and visual disturbance.  Respiratory: Negative for cough, chest tightness, shortness of breath, wheezing and stridor.   Cardiovascular: Negative for chest pain, palpitations and leg swelling.  Gastrointestinal: Negative for nausea and vomiting.  Musculoskeletal: Negative for neck pain and neck stiffness.  Skin: Negative for color change, pallor, rash and wound.  Neurological: Negative for dizziness, light-headedness and headaches.  Psychiatric/Behavioral: Negative for behavioral problems.     Physical Exam Updated Vital Signs BP 118/77 (BP Location: Right  Arm)   Pulse 83   Temp 97.8 F (36.6 C)   Resp 18   SpO2 98%   Physical Exam  Constitutional: She appears well-developed and well-nourished. No distress.  Afebrile, nontoxic-appearing, sitting comfortably in chair in no acute distress.  HENT:  Head: Normocephalic and atraumatic.  Eyes: EOM are normal. Pupils are equal, round, and reactive to light. Right eye exhibits no discharge. Left eye exhibits no discharge.  No periorbital edema, warmth or erythema. No pain with EOM. No discharge.  No foreign body on eyelid eversions. Lids,  lacrimals without lesions.  Lashes on the right appear to have clue and a few artificial lashes remaining. Conjunctiva and sclera are-injected.   Neck: Normal range of motion. Neck supple.  Cardiovascular: Normal rate, regular rhythm and normal heart sounds.  No murmur heard. Pulmonary/Chest: Effort normal and breath sounds normal. No stridor. No respiratory distress. She has no wheezes.  Abdominal: She exhibits no distension.  Musculoskeletal: Normal range of motion. She exhibits no edema.  Neurological: She is alert.  Skin: Skin is warm and dry. No rash noted. She is not diaphoretic. No erythema. No pallor.  Psychiatric: She has a normal mood and  affect.  Nursing note and vitals reviewed.    ED Treatments / Results  Labs (all labs ordered are listed, but only abnormal results are displayed) Labs Reviewed - No data to display  EKG  EKG Interpretation None       Radiology No results found.  Procedures Procedures (including critical care time)  Medications Ordered in ED Medications  artificial tears (LACRILUBE) ophthalmic ointment (1 application Both Eyes Given 04/29/17 1735)     Initial Impression / Assessment and Plan / ED Course  I have reviewed the triage vital signs and the nursing notes.  Pertinent labs & imaging results that were available during my care of the patient were reviewed by me and considered in my medical decision  making (see chart for details).    Patient presenting with bilateral eye irritation following the application of artificial eyelashes on Friday.  removed the next day (yesterday) with resolution of the burning pain but continued scleral injection.  No visual changes, foreign body sensation, no eye pain. Patient is reporting pruritus and erythema only.  Applied warm compress to the eyelid to help dislodge remaining glue to remove offending agent. Will provide symptomatic relief in the emergency department with anti-histamine and artificial tears.   Patient is otherwise well-appearing, nontoxic afebrile without other complaints. Discharge home with symptomatic relief and close follow-up with PCP and ophthalmology as needed.  Discussed strict return precautions and advised to return to the emergency department if experiencing any new or worsening symptoms. Instructions were understood and patient agreed with discharge plan.  Final Clinical Impressions(s) / ED Diagnoses   Final diagnoses:  Eye irritation    ED Discharge Orders        Ordered    artificial tears (LACRILUBE) OINT ophthalmic ointment  Every 4 hours PRN     04/29/17 1711       Dossie Der 04/29/17 2325    Virgel Manifold, MD 04/29/17 2356

## 2017-04-29 NOTE — Discharge Instructions (Signed)
As discussed, use the artificial tears to help with your symptoms. Follow up with ophthalmology and primary care as needed.   Return sooner if symptoms worsen, facial swelling, pain with eye movement, visual changes, headache, nausea vomiting or other new concerning symptoms in the meantime.

## 2017-04-30 ENCOUNTER — Encounter: Payer: Self-pay | Admitting: Pharmacist

## 2017-04-30 NOTE — Progress Notes (Signed)
PA completed and approved for Entresto. PA# [id number]

## 2017-05-01 ENCOUNTER — Telehealth: Payer: Self-pay | Admitting: Internal Medicine

## 2017-05-01 NOTE — Telephone Encounter (Signed)
This was already completed and approved. Cover my meds was already contacted and told it was completed. Will resend completed message.

## 2017-05-01 NOTE — Telephone Encounter (Signed)
Kathy Rosales  From Cover My Meds called requesting prior auth on sacubitril-valsartan (ENTRESTO) 24-26 MG  The reference number is [id number].  Please f/u

## 2017-05-04 ENCOUNTER — Ambulatory Visit (HOSPITAL_COMMUNITY)
Admission: RE | Admit: 2017-05-04 | Discharge: 2017-05-04 | Disposition: A | Discharge status: Home / Self Care | Payer: 59 | Source: Ambulatory Visit | Attending: Cardiology | Admitting: Cardiology

## 2017-05-04 ENCOUNTER — Other Ambulatory Visit (HOSPITAL_COMMUNITY): Payer: Self-pay | Admitting: Cardiology

## 2017-05-04 DIAGNOSIS — I5022 Chronic systolic (congestive) heart failure: Secondary | ICD-10-CM | POA: Diagnosis present

## 2017-05-04 LAB — BASIC METABOLIC PANEL
Anion gap: 11 (ref 5–15)
BUN: 11 mg/dL (ref 6–20)
CO2: 21 mmol/L — ABNORMAL LOW (ref 22–32)
Calcium: 8.6 mg/dL — ABNORMAL LOW (ref 8.9–10.3)
Chloride: 103 mmol/L (ref 101–111)
Creatinine, Ser: 0.65 mg/dL (ref 0.44–1.00)
GFR calc Af Amer: >60 mL/min (ref >60)
GFR calc non Af Amer: >60 mL/min (ref >60)
Glucose, Bld: 97 mg/dL (ref 65–99)
Potassium: 5.2 mmol/L — ABNORMAL HIGH (ref 3.5–5.1)
Sodium: 135 mmol/L (ref 135–145)

## 2017-05-07 ENCOUNTER — Ambulatory Visit (HOSPITAL_COMMUNITY)
Admission: RE | Admit: 2017-05-07 | Discharge: 2017-05-07 | Disposition: A | Discharge status: Home / Self Care | Payer: 59 | Source: Ambulatory Visit | Attending: Cardiovascular Disease | Admitting: Cardiovascular Disease

## 2017-05-07 ENCOUNTER — Other Ambulatory Visit (HOSPITAL_COMMUNITY): Payer: Self-pay | Admitting: *Deleted

## 2017-05-07 ENCOUNTER — Other Ambulatory Visit: Payer: Self-pay

## 2017-05-07 ENCOUNTER — Ambulatory Visit (HOSPITAL_BASED_OUTPATIENT_CLINIC_OR_DEPARTMENT_OTHER): Payer: 59

## 2017-05-07 DIAGNOSIS — I5022 Chronic systolic (congestive) heart failure: Secondary | ICD-10-CM | POA: Diagnosis present

## 2017-05-07 DIAGNOSIS — E875 Hyperkalemia: Secondary | ICD-10-CM

## 2017-05-07 DIAGNOSIS — I428 Other cardiomyopathies: Secondary | ICD-10-CM | POA: Diagnosis not present

## 2017-05-07 LAB — BASIC METABOLIC PANEL
Anion gap: 9 (ref 5–15)
BUN: 11 mg/dL (ref 6–20)
CO2: 24 mmol/L (ref 22–32)
Calcium: 8.6 mg/dL — ABNORMAL LOW (ref 8.9–10.3)
Chloride: 106 mmol/L (ref 101–111)
Creatinine, Ser: 0.63 mg/dL (ref 0.44–1.00)
GFR calc Af Amer: >60 mL/min (ref >60)
GFR calc non Af Amer: >60 mL/min (ref >60)
Glucose, Bld: 79 mg/dL (ref 65–99)
Potassium: 3.5 mmol/L (ref 3.5–5.1)
Sodium: 139 mmol/L (ref 135–145)

## 2017-05-08 ENCOUNTER — Ambulatory Visit (INDEPENDENT_AMBULATORY_CARE_PROVIDER_SITE_OTHER): Payer: 59 | Admitting: *Deleted

## 2017-05-08 DIAGNOSIS — I428 Other cardiomyopathies: Secondary | ICD-10-CM | POA: Diagnosis not present

## 2017-05-09 ENCOUNTER — Encounter: Payer: Self-pay | Admitting: Cardiology

## 2017-05-09 NOTE — Progress Notes (Signed)
Remote ICD transmission.   

## 2017-05-16 ENCOUNTER — Telehealth: Payer: Self-pay | Admitting: Internal Medicine

## 2017-05-16 NOTE — Telephone Encounter (Signed)
Left detailed message on home phone with results of ECHO.  Notified to call office if any addl questions.

## 2017-05-16 NOTE — Telephone Encounter (Signed)
New message      Pt came to the office to get echo results.  I told her I would have the nurse call her with the results.  Please call pt with results.

## 2017-05-16 NOTE — Telephone Encounter (Signed)
-----   Message from Evans Lance, MD sent at 05/15/2017  5:09 PM EST ----- Heart pumping function about the same. No change.

## 2017-05-23 ENCOUNTER — Encounter (HOSPITAL_COMMUNITY): Payer: Self-pay

## 2017-05-23 ENCOUNTER — Ambulatory Visit (HOSPITAL_COMMUNITY)
Admission: RE | Admit: 2017-05-23 | Discharge: 2017-05-23 | Disposition: A | Discharge status: Home / Self Care | Payer: 59 | Source: Ambulatory Visit | Attending: Internal Medicine | Admitting: Internal Medicine

## 2017-05-23 ENCOUNTER — Other Ambulatory Visit (HOSPITAL_COMMUNITY): Payer: Self-pay

## 2017-05-23 VITALS — BP 142/84 | HR 85 | Wt 144.0 lb

## 2017-05-23 DIAGNOSIS — R0789 Other chest pain: Secondary | ICD-10-CM | POA: Insufficient documentation

## 2017-05-23 DIAGNOSIS — I429 Cardiomyopathy, unspecified: Secondary | ICD-10-CM | POA: Diagnosis not present

## 2017-05-23 DIAGNOSIS — Z7982 Long term (current) use of aspirin: Secondary | ICD-10-CM | POA: Diagnosis not present

## 2017-05-23 DIAGNOSIS — I5022 Chronic systolic (congestive) heart failure: Secondary | ICD-10-CM | POA: Insufficient documentation

## 2017-05-23 DIAGNOSIS — Z95810 Presence of automatic (implantable) cardiac defibrillator: Secondary | ICD-10-CM | POA: Diagnosis not present

## 2017-05-23 DIAGNOSIS — M061 Adult-onset Still's disease: Secondary | ICD-10-CM | POA: Diagnosis not present

## 2017-05-23 DIAGNOSIS — Z79899 Other long term (current) drug therapy: Secondary | ICD-10-CM | POA: Diagnosis not present

## 2017-05-23 DIAGNOSIS — R51 Headache: Secondary | ICD-10-CM | POA: Diagnosis not present

## 2017-05-23 DIAGNOSIS — Z9889 Other specified postprocedural states: Secondary | ICD-10-CM | POA: Diagnosis not present

## 2017-05-23 DIAGNOSIS — I340 Nonrheumatic mitral (valve) insufficiency: Secondary | ICD-10-CM | POA: Diagnosis not present

## 2017-05-23 DIAGNOSIS — I081 Rheumatic disorders of both mitral and tricuspid valves: Secondary | ICD-10-CM | POA: Diagnosis not present

## 2017-05-23 DIAGNOSIS — R079 Chest pain, unspecified: Secondary | ICD-10-CM | POA: Diagnosis not present

## 2017-05-23 DIAGNOSIS — Z8249 Family history of ischemic heart disease and other diseases of the circulatory system: Secondary | ICD-10-CM | POA: Insufficient documentation

## 2017-05-23 DIAGNOSIS — Z87891 Personal history of nicotine dependence: Secondary | ICD-10-CM | POA: Insufficient documentation

## 2017-05-23 DIAGNOSIS — I428 Other cardiomyopathies: Secondary | ICD-10-CM | POA: Diagnosis not present

## 2017-05-23 DIAGNOSIS — R519 Headache, unspecified: Secondary | ICD-10-CM

## 2017-05-23 LAB — BASIC METABOLIC PANEL
Anion gap: 9 (ref 5–15)
BUN: 12 mg/dL (ref 6–20)
CO2: 24 mmol/L (ref 22–32)
Calcium: 8.5 mg/dL — ABNORMAL LOW (ref 8.9–10.3)
Chloride: 106 mmol/L (ref 101–111)
Creatinine, Ser: 0.61 mg/dL (ref 0.44–1.00)
GFR calc Af Amer: >60 mL/min (ref >60)
GFR calc non Af Amer: >60 mL/min (ref >60)
Glucose, Bld: 93 mg/dL (ref 65–99)
Potassium: 4 mmol/L (ref 3.5–5.1)
Sodium: 139 mmol/L (ref 135–145)

## 2017-05-23 MED ORDER — FUROSEMIDE 20 MG PO TABS: 40.0000 mg | ORAL_TABLET | Freq: Every day | ORAL | 3 refills | Status: DC

## 2017-05-23 MED ORDER — SACUBITRIL-VALSARTAN 49-51 MG PO TABS: 1.0000 | ORAL_TABLET | Freq: Two times a day (BID) | ORAL | 11 refills | Status: DC

## 2017-05-23 MED ORDER — POTASSIUM CHLORIDE ER 20 MEQ PO TBCR: 20.0000 meq | EXTENDED_RELEASE_TABLET | Freq: Every day | ORAL | 3 refills | Status: DC

## 2017-05-23 NOTE — Progress Notes (Signed)
PCP: Dr. Janne Napoleon Primary Cardiologist: Dr. Stanford Breed  HF MD: Dr Aundra Dubin     Advanced Heart Failure Clinic Note   HPI:  Kathy Rosales is a 42 y.o. female with a past medical history of NICM EF 25-30% (cath in 05/2016 with normal cors), moderate mitral regurgitation, and tricuspid regurgitation. No FH of coronary disease.   Diagnosed with CHF and  went to her PCP in January 2018 for dyspnea. Says she started getting SOB in the fall. Denies any type of viral illness. No family history of heart disease. Echo was performed and showed reduced EF 20-25% with mod-severe MR. at 25-30%. She was referred to Dr. Stanford Breed who then set her up for a left and right heart cath. Admitted over night on 2/19 after cath due to changes in LOC after cath, was hypotensive. Symptoms resolved, head CT negative. Optimization of her medications was prohibited by hypotension prior to discharge. Started on corlanor.   Had CPX 07/15/16. Results as below. Immediately after, pt sat in chair and then had a pseudoseizure. No tonic/clonic activity. Pt taken to ED with Neurology consulted. Pt recovered spontaneously recovered. CT angio chest and CT head  unremarkable.  Repeat echo was done in 4/18.  EF remains 20-25% with moderate diastolic dysfunction.   She is s/p SubQ ICD implantation 09/27/16. Admission complicated by AMS thought to be psychogenic. CT head, CTA head/neck and EEG were all negative.   She presents today for regular follow up. At last visit switched to [facility]. Feeling good overall. She states she has felt a little more fatigue over the past week, but no other specific complaints. Denies lightheadedness or dizziness, No CP, or SOB. No orthopnea. Appetite stable. Occasional headaches, but well controlled with tylenol. Denies peripheral edema. Says she is having some soreness around her ICD.   Labs (6/18): K 3.5, creatinine 0.71, hgb 13.1  CPX 07/18/16 Pre-Exercise PFTs  FVC 2.79 (92%)    FEV1 2.38  (93%)     FEV1/FVC 85 (101%)     MVV 48 (49%) Exercise Time:  3:15  Speed (mph): 1.5   Grade (%): 0   RPE: 17 Reason stopped: Patient ended test due to Dyspnea (8/10) Additional symptoms: lightheaded (9/10), chest pressure (9/10) Resting HR: 84 Peak HR: 95  (53% age predicted max HR) BP rest: 92/62 BP peak: 88/60 Peak VO2: 14.1 (44% predicted peak VO2) VE/VCO2 slope: 59.4 OUES: 0.73 Peak RER: 0.96 Ventilatory Threshold: Not achieved  Peak RR 77 Peak Ventilation: 28.8 VE/MVV: 60% PETCO2 at peak: 28 O2pulse: 8  80% predicted O2pulse)  RHC/LHC 2/19/018  RA 3 PCWP  20 CO/CI 4.43/2.88 LHC normal cors.   ECHO 04/2016 EF 20-25%. Mod-Severe MR, Mod TR.  ECHO 4/18 EF 202-5%, moderate diastolic dysfunction, PASP 31 mmHg.   SH: Lives with Mom. No alcohol or drug abuse. Does not smoke cigarettes  FH: No history of CAD.    Review of systems complete and found to be negative unless listed in HPI.    SH:  Social History   Socioeconomic History  . Marital status: Married    Spouse name: Not on file  . Number of children: 1  . Years of education: Not on file  . Highest education level: Not on file  Social Needs  . Financial resource strain: Not on file  . Food insecurity - worry: Not on file  . Food insecurity - inability: Not on file  . Transportation needs - medical: Not on file  . Transportation needs -  non-medical: Not on file  Occupational History  . Occupation: unemployeed  Tobacco Use  . Smoking status: Former Research scientist (life sciences)  . Smokeless tobacco: Never Used  Substance and Sexual Activity  . Alcohol use: No  . Drug use: No  . Sexual activity: No    Birth control/protection: None  Other Topics Concern  . Not on file  Social History Narrative   ** Merged History Encounter **        FH:  Family History  Problem Relation Age of Onset  . Cancer Paternal Grandfather   . Mental illness Neg Hx     Past Medical History:  Diagnosis Date  . Acute  systolic CHF (congestive heart failure) ([place]) 05/22/2016  . Blood transfusion without reported diagnosis   . 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 artificial tears (LACRILUBE) OINT ophthalmic ointment Place into both eyes every 4 (four) hours as needed for dry eyes. 5 g 0  . aspirin EC 81 MG tablet Take 1 tablet (81 mg total) by mouth daily. 90 tablet 3  . Aspirin-Salicylamide-Caffeine (BC HEADACHE PO) Take 1 packet by mouth 4 (four) times daily as needed (headaches).    . carvedilol (COREG) 6.25 MG tablet TAKE 1 TABLET BY MOUTH 2 TIMES DAILY. 60 tablet 2  . digoxin (LANOXIN) 0.125 MG tablet Take 1 tablet (0.125 mg total) by mouth daily. 90 tablet 3  . furosemide (LASIX) 20 MG tablet Take 2 tablets (40 mg total) by mouth daily. 180 tablet 3  . PARoxetine (PAXIL) 20 MG tablet Take 20 mg by mouth daily.    . potassium chloride 20 MEQ TBCR Take 20 mEq by mouth daily. 90 tablet 3  . riboflavin (VITAMIN B-2) 100 MG TABS tablet Take 200 mg by mouth daily.    . sacubitril-valsartan (ENTRESTO) 24-26 MG Take 1 tablet by mouth 2 (two) times daily. 60 tablet 11  . spironolactone (ALDACTONE) 25 MG tablet Take 1 tablet (25 mg total) by mouth daily. 30 tablet 6  . traZODone (DESYREL) 100 MG tablet Take 200 mg by mouth at bedtime as needed for sleep.      No current facility-administered medications for this encounter.     Vitals:   05/23/17 0832  BP: (!) 142/84  Pulse: 85  SpO2: 98%  Weight: 144 lb (65.3 kg)   Wt Readings from Last 3 Encounters:  05/23/17 144 lb (65.3 kg)  04/25/17 145 lb 3.2 oz (65.9 kg)  04/11/17 144 lb (65.3 kg)    PHYSICAL EXAM: Interpreter present  General: Well appearing. No resp difficulty. HEENT: Normal Neck: Supple. JVP 6-7. Carotids 2+ bilat; no bruits. No thyromegaly or nodule noted. Cor: PMI nondisplaced. RRR, No M/G/R noted Lungs: CTAB, normal effort. Abdomen: Soft, non-tender, non-distended, no HSM. No  bruits or masses. +BS  Extremities: No cyanosis, clubbing, or rash. R and LLE no edema.  Neuro: Alert & orientedx3, cranial nerves grossly intact. moves all 4 extremities w/o difficulty. Affect pleasant   ASSESSMENT & PLAN: 1. Chronic Systolic Heart Failure: Nonischemic cardiomyopathy, ?viral cardiomyopathy. LHC 05/2016 no coronary disease. Echo 05/2016 EF 20-25% mod MR, repeat echo 07/21/16 LVEF 20-25%.  [place] ICD.   - NYHA II symptoms - Volume status stable on lasix 40 mg daily.  - Continue Coreg 6.25 mg BID - Increase Entresto 49/51 mg BID. BMET today and 7-10 days.  - Continue spiro 25 mg daily.  - Continue digoxin 0.125 mg daily. Level stable 04/25/2017.   -  Reinforced fluid restriction to < 2 L daily, sodium restriction to less than 2000 mg daily, and the importance of daily weights.   2. Mitral Regurgitation:  - Mild by Echo in April 2018.   3. Atypical chest pain: - No further.   4. Tension HAs - No recent med changes. Recommended she use tylenol. Improved.  Meds and labs as above. Repeat BMET 7-10 days with switch to [facility]. Continue paramedicine.   Shirley Friar, PA-C  05/23/2017 8:33 AM   Greater than 50% of the 30 minute visit was spent in counseling/coordination of care regarding disease state education, salt/fluid restriction, sliding scale diuretics, and medication compliance.

## 2017-05-23 NOTE — Patient Instructions (Signed)
Por favor aumenta su Entresto a 49-51 mg dos veces al dia.  Analisis de [employer]. La llamamos si necesita hacer cambios.   Analisis de sangre en 10 dias.   Necesita otra cita con Dr. Aundra Dubin en 6 semanas.

## 2017-05-23 NOTE — Progress Notes (Signed)
Paramedicine Encounter   Patient ID: Kathy Rosales , female,   DOB: [birth year],41 y.o.,  MRN: [id number]   Kathy Rosales was seen in the clinic today with PA Jonni Sanger. She reports feeling generally well. Entresto dosage increased, continue to hold potassium. Discussed d/c, however she was reluctant. I plan to see her at home for additional medication training, and hope to d/c within the month.   Time spent with patient: [place], EMT 05/23/2017   ACTION: Home visit completed Next visit planned for 1 week

## 2017-05-25 ENCOUNTER — Telehealth (HOSPITAL_COMMUNITY): Payer: Self-pay

## 2017-05-25 NOTE — Telephone Encounter (Signed)
I called Kathy Rosales to see if she was still able to meet this evening at 17:00. She did not answer but I left a voicemail requesting she call me back to confirm.

## 2017-05-25 NOTE — Telephone Encounter (Signed)
Kathy Rosales sent me a text message stated she would not be able to meet today. She said she was expecting me yesterday and would not be home this evening. I apologized for the misunderstanding and we agreed to meet next week.

## 2017-05-31 ENCOUNTER — Telehealth (HOSPITAL_COMMUNITY): Payer: Self-pay

## 2017-05-31 NOTE — Telephone Encounter (Signed)
I called [name] to schedule an appointment. She did not answer so I left a voicemail requesting she call me back.

## 2017-06-04 ENCOUNTER — Ambulatory Visit (HOSPITAL_COMMUNITY)
Admission: RE | Admit: 2017-06-04 | Discharge: 2017-06-04 | Disposition: A | Discharge status: Home / Self Care | Payer: 59 | Source: Ambulatory Visit | Attending: Cardiology | Admitting: Cardiology

## 2017-06-04 DIAGNOSIS — I5022 Chronic systolic (congestive) heart failure: Secondary | ICD-10-CM | POA: Diagnosis present

## 2017-06-04 LAB — CUP PACEART REMOTE DEVICE CHECK
Battery Remaining Percentage: 94 %
Date Time Interrogation Session: 20190204121900
Implantable Lead Implant Date: 20180627
Implantable Lead Location: 753862
Implantable Lead Model: 3401
Implantable Lead Serial Number: 113145
Implantable Pulse Generator Implant Date: 20180627
Pulse Gen Serial Number: 222625

## 2017-06-04 LAB — BASIC METABOLIC PANEL
Anion gap: 11 (ref 5–15)
BUN: 7 mg/dL (ref 6–20)
CO2: 24 mmol/L (ref 22–32)
Calcium: 8.8 mg/dL — ABNORMAL LOW (ref 8.9–10.3)
Chloride: 104 mmol/L (ref 101–111)
Creatinine, Ser: 0.63 mg/dL (ref 0.44–1.00)
GFR calc Af Amer: >60 mL/min (ref >60)
GFR calc non Af Amer: >60 mL/min (ref >60)
Glucose, Bld: 107 mg/dL — ABNORMAL HIGH (ref 65–99)
Potassium: 3.1 mmol/L — ABNORMAL LOW (ref 3.5–5.1)
Sodium: 139 mmol/L (ref 135–145)

## 2017-06-06 ENCOUNTER — Telehealth (HOSPITAL_COMMUNITY): Payer: Self-pay

## 2017-06-06 ENCOUNTER — Telehealth (HOSPITAL_COMMUNITY): Payer: Self-pay | Admitting: *Deleted

## 2017-06-06 DIAGNOSIS — I5022 Chronic systolic (congestive) heart failure: Secondary | ICD-10-CM

## 2017-06-06 NOTE — Telephone Encounter (Signed)
I called Kathy Rosales to confirm our 17:00 appointment which we discussed last week. She did not answer so I left a message requesting she call me back. She sent me a text message stating that she was in a meeting at her daughter's school and would not be able to meet today and did not know when she could reschedule. I asked she call me when she is able and let me know when she can meet.

## 2017-06-06 NOTE — Telephone Encounter (Signed)
-----   Message from Shirley Friar, PA-C sent at 06/05/2017  7:59 AM EST ----- Potassium low.   Please make sure she is taking Mearl Latin, and K as prescribed.   If so, needs to increase K to 40 meq daily (OK to do split dosing)  Recheck 1 week.  Legrand Como [address] [place], PA-C 06/05/2017 7:59 AM

## 2017-06-13 ENCOUNTER — Other Ambulatory Visit (HOSPITAL_COMMUNITY): Payer: Self-pay

## 2017-06-22 ENCOUNTER — Other Ambulatory Visit: Payer: Self-pay | Admitting: Cardiology

## 2017-07-09 ENCOUNTER — Encounter (HOSPITAL_COMMUNITY): Payer: Self-pay | Admitting: Cardiology

## 2017-07-09 ENCOUNTER — Ambulatory Visit (HOSPITAL_COMMUNITY)
Admission: RE | Admit: 2017-07-09 | Discharge: 2017-07-09 | Disposition: A | Discharge status: Home / Self Care | Payer: 59 | Source: Ambulatory Visit | Attending: Cardiology | Admitting: Cardiology

## 2017-07-09 VITALS — BP 122/88 | HR 105 | Wt 140.0 lb

## 2017-07-09 DIAGNOSIS — Z7982 Long term (current) use of aspirin: Secondary | ICD-10-CM | POA: Insufficient documentation

## 2017-07-09 DIAGNOSIS — I5022 Chronic systolic (congestive) heart failure: Secondary | ICD-10-CM | POA: Insufficient documentation

## 2017-07-09 DIAGNOSIS — I428 Other cardiomyopathies: Secondary | ICD-10-CM

## 2017-07-09 DIAGNOSIS — I340 Nonrheumatic mitral (valve) insufficiency: Secondary | ICD-10-CM | POA: Insufficient documentation

## 2017-07-09 DIAGNOSIS — I429 Cardiomyopathy, unspecified: Secondary | ICD-10-CM | POA: Diagnosis not present

## 2017-07-09 DIAGNOSIS — Z79899 Other long term (current) drug therapy: Secondary | ICD-10-CM | POA: Insufficient documentation

## 2017-07-09 DIAGNOSIS — Z87891 Personal history of nicotine dependence: Secondary | ICD-10-CM | POA: Insufficient documentation

## 2017-07-09 LAB — BASIC METABOLIC PANEL
Anion gap: 11 (ref 5–15)
BUN: 15 mg/dL (ref 6–20)
CO2: 22 mmol/L (ref 22–32)
Calcium: 8.9 mg/dL (ref 8.9–10.3)
Chloride: 105 mmol/L (ref 101–111)
Creatinine, Ser: 0.6 mg/dL (ref 0.44–1.00)
GFR calc Af Amer: >60 mL/min (ref >60)
GFR calc non Af Amer: >60 mL/min (ref >60)
Glucose, Bld: 82 mg/dL (ref 65–99)
Potassium: 3.5 mmol/L (ref 3.5–5.1)
Sodium: 138 mmol/L (ref 135–145)

## 2017-07-09 LAB — DIGOXIN LEVEL: Digoxin Level: <0.2 ng/mL — ABNORMAL LOW (ref 0.8–2.0)

## 2017-07-09 MED ORDER — CARVEDILOL 12.5 MG PO TABS: 12.5000 mg | ORAL_TABLET | Freq: Two times a day (BID) | ORAL | 3 refills | Status: DC

## 2017-07-09 NOTE — Patient Instructions (Signed)
Labs today (will call for abnormal results, otherwise no news is good news)  INCREASE Carvedilol to 12.5 mg (1 Tablet) Twice Daily.  Follow up with [facility] in 6 weeks.  Follow up with Dr. Aundra Dubin in 3 months.

## 2017-07-10 NOTE — Progress Notes (Signed)
PCP: Dr. Janne Napoleon Primary Cardiologist: Dr. Stanford Breed  HF MD: Dr Aundra Dubin     Advanced Heart Failure Clinic Note   HPI:  Kathy Rosales is a 42 y.o. female with a past medical history of NICM EF 25-30% (cath in 05/2016 with normal cors), moderate mitral regurgitation, and tricuspid regurgitation. No FH of coronary disease.   Diagnosed with CHF and  went to her PCP in January 2018 for dyspnea. Says she started getting SOB in the fall. Denies any type of viral illness. No family history of heart disease. Echo was performed and showed reduced EF 20-25% with mod-severe MR. at 25-30%. She was referred to Dr. Stanford Breed who then set her up for a left and right heart cath. Admitted over night on 2/19 after cath due to changes in LOC after cath, was hypotensive. Symptoms resolved, head CT negative. Optimization of her medications was prohibited by hypotension prior to discharge. Started on corlanor.   Had CPX 07/15/16. Results as below. Immediately after, pt sat in chair and then had a pseudoseizure. No tonic/clonic activity. Pt taken to ED with Neurology consulted. Pt recovered spontaneously recovered. CT angio chest and CT head unremarkable.  Repeat echo was done in 4/18.  EF remained 20-25% with moderate diastolic dysfunction.   She is s/p [employer] ICD implantation 09/27/16. Admission complicated by AMS thought to be psychogenic. CT head, CTA head/neck and EEG were all negative.   She presents today for followup of CHF. She has been doing well, no dyspnea walking on flat ground. Mild dyspnea with stairs. She has rare atypical chest pain.  Chronic 2 pillow orthopnea but this is improved from the past.  No lightheadedness or palpitations.  No PND.   ECG (personally reviewed): Sinus tachycardia, LVH, nonspecific T wave flattening  Labs (6/18): K 3.5, creatinine 0.71, hgb 13.1 Labs (1/19): digoxin < 0.2 Labs (3/19): K 3.1, creatinine 0.63  CPX 07/18/16 Pre-Exercise PFTs  FVC  2.79 (92%)    FEV1 2.38 (93%)     FEV1/FVC 85 (101%)     MVV 48 (49%) Exercise Time:  3:15  Speed (mph): 1.5   Grade (%): 0   RPE: 17 Reason stopped: Patient ended test due to Dyspnea (8/10) Additional symptoms: lightheaded (9/10), chest pressure (9/10) Resting HR: 84 Peak HR: 95  (53% age predicted max HR) BP rest: 92/62 BP peak: 88/60 Peak VO2: 14.1 (44% predicted peak VO2) VE/VCO2 slope: 59.4 OUES: 0.73 Peak RER: 0.96 Ventilatory Threshold: Not achieved  Peak RR 77 Peak Ventilation: 28.8 VE/MVV: 60% PETCO2 at peak: 28 O2pulse: 8  80% predicted O2pulse)  RHC/LHC 2/19/018  RA 3 PCWP  20 CO/CI 4.43/2.88 LHC normal cors.   ECHO 04/2016 EF 20-25%. Mod-Severe MR, Mod TR.  ECHO 4/18 EF 202-5%, moderate diastolic dysfunction, PASP 31 mmHg.   SH: Lives with Mom. No alcohol or drug abuse. Does not smoke cigarettes  FH: No history of CAD.    Review of systems complete and found to be negative unless listed in HPI.    SH:  Social History   Socioeconomic History  . Marital status: Married    Spouse name: Not on file  . Number of children: 1  . Years of education: Not on file  . Highest education level: Not on file  Occupational History  . Occupation: unemployeed  Social Needs  . Financial resource strain: Not on file  . Food insecurity:    Worry: Not on file    Inability: Not on file  .  Transportation needs:    Medical: Not on file    Non-medical: Not on file  Tobacco Use  . Smoking status: Former Research scientist (life sciences)  . Smokeless tobacco: Never Used  Substance and Sexual Activity  . Alcohol use: No  . Drug use: No  . Sexual activity: Never    Birth control/protection: None  Lifestyle  . Physical activity:    Days per week: Not on file    Minutes per session: Not on file  . Stress: Not on file  Relationships  . Social connections:    Talks on phone: Not on file    Gets together: Not on file    Attends religious service: Not on file    Active  member of club or organization: Not on file    Attends meetings of clubs or organizations: Not on file    Relationship status: Not on file  . Intimate partner violence:    Fear of current or ex partner: Not on file    Emotionally abused: Not on file    Physically abused: Not on file    Forced sexual activity: Not on file  Other Topics Concern  . Not on file  Social History Narrative   ** Merged History Encounter **        FH:  Family History  Problem Relation Age of Onset  . Cancer Paternal Grandfather   . Mental illness Neg Hx     Past Medical History:  Diagnosis Date  . Acute systolic CHF (congestive heart failure) ([place]) 05/22/2016  . Blood transfusion without reported diagnosis   . 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 artificial tears (LACRILUBE) OINT ophthalmic ointment Place into both eyes every 4 (four) hours as needed for dry eyes. 5 g 0  . aspirin EC 81 MG tablet Take 1 tablet (81 mg total) by mouth daily. 90 tablet 3  . Aspirin-Salicylamide-Caffeine (BC HEADACHE PO) Take 1 packet by mouth 4 (four) times daily as needed (headaches).    . carvedilol (COREG) 12.5 MG tablet Take 1 tablet (12.5 mg total) by mouth 2 (two) times daily. 60 tablet 3  . digoxin (LANOXIN) 0.125 MG tablet TAKE 1 TABLET BY MOUTH DAILY. 90 tablet 2  . furosemide (LASIX) 20 MG tablet Take 2 tablets (40 mg total) by mouth daily. 180 tablet 3  . PARoxetine (PAXIL) 20 MG tablet Take 20 mg by mouth daily.    . Potassium Chloride ER 20 MEQ TBCR Take 20 mEq by mouth daily. 90 tablet 3  . riboflavin (VITAMIN B-2) 100 MG TABS tablet Take 200 mg by mouth daily.    . sacubitril-valsartan (ENTRESTO) 49-51 MG Take 1 tablet by mouth 2 (two) times daily. 60 tablet 11  . spironolactone (ALDACTONE) 25 MG tablet Take 1 tablet (25 mg total) by mouth daily. 30 tablet 6  . traZODone (DESYREL) 100 MG tablet Take 200 mg by mouth at bedtime as needed for sleep.       No current facility-administered medications for this encounter.     Vitals:   07/09/17 1544  BP: 122/88  Pulse: (!) 105  SpO2: 98%  Weight: 140 lb (63.5 kg)   Wt Readings from Last 3 Encounters:  07/09/17 140 lb (63.5 kg)  05/23/17 144 lb (65.3 kg)  04/25/17 145 lb 3.2 oz (65.9 kg)    PHYSICAL EXAM: Interpreter present  General: NAD Neck: No JVD, no thyromegaly or thyroid nodule.  Lungs: Clear to  auscultation bilaterally with normal respiratory effort. CV: Nondisplaced PMI.  Heart mildly tachy, regular S1/S2, no S3/S4, no murmur.  No peripheral edema.  No carotid bruit.  Normal pedal pulses.  Abdomen: Soft, nontender, no hepatosplenomegaly, no distention.  Skin: Intact without lesions or rashes.  Neurologic: Alert and oriented x 3.  Psych: Normal affect. Extremities: No clubbing or cyanosis.  HEENT: Normal.   ASSESSMENT & PLAN: 1. Chronic Systolic Heart Failure: Nonischemic cardiomyopathy, ?viral cardiomyopathy. LHC 05/2016 no coronary disease. Echo 05/2016 EF 20-25% mod MR.  Repeat echo 07/21/16 LVEF 20-25%. [place] subcutaneous ICD.  NYHA class II symptoms, not volume overloaded on exam.  - Continue Lasix 40 mg daily with KCl 20.  Needs BMET today.   - Increase Coreg to 12.5 mg bid.  - Continue Entresto 49/51 mg BID.  - Continue spiro 25 mg daily.  - Continue digoxin 0.125 mg daily. Check level today.     - I will arrange for repeat echo.  2. Mitral regurgitation: Only mild on last echo.  Loralie Champagne, MD  07/10/2017

## 2017-07-26 ENCOUNTER — Encounter (HOSPITAL_COMMUNITY): Payer: Self-pay

## 2017-07-26 NOTE — Progress Notes (Signed)
Kathy Rosales has been seen by [facility]. She has been compliant with her medications and has been able to manage her heart failure without assistance over the past two months. For this reason, I believe discharge from the [facility] program is reasonable.

## 2017-08-06 ENCOUNTER — Ambulatory Visit (INDEPENDENT_AMBULATORY_CARE_PROVIDER_SITE_OTHER): Payer: 59 | Admitting: *Deleted

## 2017-08-06 DIAGNOSIS — I428 Other cardiomyopathies: Secondary | ICD-10-CM | POA: Diagnosis not present

## 2017-08-07 NOTE — Progress Notes (Signed)
Remote ICD transmission.   

## 2017-08-08 ENCOUNTER — Encounter: Payer: Self-pay | Admitting: Cardiology

## 2017-08-17 ENCOUNTER — Encounter (HOSPITAL_COMMUNITY): Payer: Self-pay

## 2017-08-20 ENCOUNTER — Ambulatory Visit (HOSPITAL_COMMUNITY)
Admission: RE | Admit: 2017-08-20 | Discharge: 2017-08-20 | Disposition: A | Discharge status: Home / Self Care | Payer: 59 | Source: Ambulatory Visit | Attending: Internal Medicine | Admitting: Internal Medicine

## 2017-08-20 ENCOUNTER — Encounter (HOSPITAL_COMMUNITY): Payer: Self-pay

## 2017-08-20 VITALS — BP 126/86 | HR 87 | Wt 139.0 lb

## 2017-08-20 DIAGNOSIS — I429 Cardiomyopathy, unspecified: Secondary | ICD-10-CM | POA: Diagnosis not present

## 2017-08-20 DIAGNOSIS — Z87891 Personal history of nicotine dependence: Secondary | ICD-10-CM | POA: Insufficient documentation

## 2017-08-20 DIAGNOSIS — Z79899 Other long term (current) drug therapy: Secondary | ICD-10-CM | POA: Insufficient documentation

## 2017-08-20 DIAGNOSIS — I428 Other cardiomyopathies: Secondary | ICD-10-CM | POA: Diagnosis not present

## 2017-08-20 DIAGNOSIS — I5022 Chronic systolic (congestive) heart failure: Secondary | ICD-10-CM | POA: Diagnosis present

## 2017-08-20 DIAGNOSIS — Z7982 Long term (current) use of aspirin: Secondary | ICD-10-CM | POA: Diagnosis not present

## 2017-08-20 DIAGNOSIS — I340 Nonrheumatic mitral (valve) insufficiency: Secondary | ICD-10-CM | POA: Insufficient documentation

## 2017-08-20 LAB — CUP PACEART REMOTE DEVICE CHECK
Battery Remaining Percentage: 91 %
Date Time Interrogation Session: 20190506215300
Implantable Lead Implant Date: 20180627
Implantable Lead Location: 753862
Implantable Lead Model: 3401
Implantable Lead Serial Number: 113145
Implantable Pulse Generator Implant Date: 20180627
Pulse Gen Serial Number: 222625

## 2017-08-20 MED ORDER — CARVEDILOL 6.25 MG PO TABS: 9.3750 mg | ORAL_TABLET | Freq: Two times a day (BID) | ORAL | 11 refills | Status: DC

## 2017-08-20 NOTE — Patient Instructions (Signed)
INCREASE Carvedilol (Coreg) to 9.375 mg (1.5 tabs) twice daily.  Follow up 3 months.  ___________________________________________________________ Kathy Rosales Code: 1500  Take all medication as prescribed the day of your appointment. Bring all medications with you to your appointment.  Do the following things EVERYDAY: 1) Weigh yourself in the morning before breakfast. Write it down and keep it in a log. 2) Take your medicines as prescribed 3) Eat low salt foods-Limit salt (sodium) to 2000 mg per day.  4) Stay as active as you can everyday 5) Limit all fluids for the day to less than 2 liters

## 2017-08-20 NOTE — Progress Notes (Signed)
PCP: Dr. Janne Napoleon Primary Cardiologist: Dr. Stanford Breed  HF MD: Dr Aundra Dubin     Advanced Heart Failure Clinic Note   HPI:  Kathy Rosales is a 42 y.o. female with a past medical history of NICM EF 25-30% (cath in 05/2016 with normal cors), moderate mitral regurgitation, and tricuspid regurgitation. No FH of coronary disease.   Diagnosed with CHF and  went to her PCP in January 2018 for dyspnea. Says she started getting SOB in the fall. Denies any type of viral illness. No family history of heart disease. Echo was performed and showed reduced EF 20-25% with mod-severe MR. at 25-30%. She was referred to Dr. Stanford Breed who then set her up for a left and right heart cath. Admitted over night on 2/19 after cath due to changes in LOC after cath, was hypotensive. Symptoms resolved, head CT negative. Optimization of her medications was prohibited by hypotension prior to discharge. Started on corlanor.   Had CPX 07/15/16. Results as below. Immediately after, pt sat in chair and then had a pseudoseizure. No tonic/clonic activity. Pt taken to ED with Neurology consulted. Pt recovered spontaneously recovered. CT angio chest and CT head unremarkable.  Repeat echo was done in 4/18.  EF remained 20-25% with moderate diastolic dysfunction.   She is s/p [employer] ICD implantation 09/27/16. Admission complicated by AMS thought to be psychogenic. CT head, CTA head/neck and EEG were all negative.   Today she returns for HF follow up. Overall feeling fine. Running 5 days a week. Denies SOB/PND/Orthopnea. Appetite ok. No fever or chills. Weight at home 136-139  pounds. Taking all medications.    Labs (6/18): K 3.5, creatinine 0.71, hgb 13.1 Labs (1/19): digoxin < 0.2 Labs (3/19): K 3.1, creatinine 0.63  CPX 07/18/16 Pre-Exercise PFTs  FVC 2.79 (92%)    FEV1 2.38 (93%)     FEV1/FVC 85 (101%)     MVV 48 (49%) Exercise Time:  3:15  Speed (mph): 1.5   Grade (%): 0   RPE:  17 Reason stopped: Patient ended test due to Dyspnea (8/10) Additional symptoms: lightheaded (9/10), chest pressure (9/10) Resting HR: 84 Peak HR: 95  (53% age predicted max HR) BP rest: 92/62 BP peak: 88/60 Peak VO2: 14.1 (44% predicted peak VO2) VE/VCO2 slope: 59.4 OUES: 0.73 Peak RER: 0.96 Ventilatory Threshold: Not achieved  Peak RR 77 Peak Ventilation: 28.8 VE/MVV: 60% PETCO2 at peak: 28 O2pulse: 8  80% predicted O2pulse)  RHC/LHC 2/19/018  RA 3 PCWP  20 CO/CI 4.43/2.88 LHC normal cors.   ECHO 04/2016 EF 20-25%. Mod-Severe MR, Mod TR.  ECHO 4/18 EF 202-5%, moderate diastolic dysfunction, PASP 31 mmHg.   SH: Lives with Mom. No alcohol or drug abuse. Does not smoke cigarettes  FH: No history of CAD.    Review of systems complete and found to be negative unless listed in HPI.    SH:  Social History   Socioeconomic History  . Marital status: Married    Spouse name: Not on file  . Number of children: 1  . Years of education: Not on file  . Highest education level: Not on file  Occupational History  . Occupation: unemployeed  Social Needs  . Financial resource strain: Not on file  . Food insecurity:    Worry: Not on file    Inability: Not on file  . Transportation needs:    Medical: Not on file    Non-medical: Not on file  Tobacco Use  . Smoking status: Former  Smoker  . Smokeless tobacco: Never Used  Substance and Sexual Activity  . Alcohol use: No  . Drug use: No  . Sexual activity: Never    Birth control/protection: None  Lifestyle  . Physical activity:    Days per week: Not on file    Minutes per session: Not on file  . Stress: Not on file  Relationships  . Social connections:    Talks on phone: Not on file    Gets together: Not on file    Attends religious service: Not on file    Active member of club or organization: Not on file    Attends meetings of clubs or organizations: Not on file    Relationship status: Not on file  . Intimate  partner violence:    Fear of current or ex partner: Not on file    Emotionally abused: Not on file    Physically abused: Not on file    Forced sexual activity: Not on file  Other Topics Concern  . Not on file  Social History Narrative   ** Merged History Encounter **        FH:  Family History  Problem Relation Age of Onset  . Cancer Paternal Grandfather   . Mental illness Neg Hx     Past Medical History:  Diagnosis Date  . Acute systolic CHF (congestive heart failure) ([place]) 05/22/2016  . Blood transfusion without reported diagnosis   . 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 artificial tears (LACRILUBE) OINT ophthalmic ointment Place into both eyes every 4 (four) hours as needed for dry eyes. 5 g 0  . aspirin EC 81 MG tablet Take 1 tablet (81 mg total) by mouth daily. 90 tablet 3  . Aspirin-Salicylamide-Caffeine (BC HEADACHE PO) Take 1 packet by mouth 4 (four) times daily as needed (headaches).    . carvedilol (COREG) 6.25 MG tablet Take 6.25 mg by mouth 2 (two) times daily with a meal.    . digoxin (LANOXIN) 0.125 MG tablet TAKE 1 TABLET BY MOUTH DAILY. 90 tablet 2  . furosemide (LASIX) 20 MG tablet Take 2 tablets (40 mg total) by mouth daily. 180 tablet 3  . PARoxetine (PAXIL) 20 MG tablet Take 20 mg by mouth daily.    . Potassium Chloride ER 20 MEQ TBCR Take 20 mEq by mouth daily. 90 tablet 3  . riboflavin (VITAMIN B-2) 100 MG TABS tablet Take 200 mg by mouth daily.    . sacubitril-valsartan (ENTRESTO) 49-51 MG Take 1 tablet by mouth 2 (two) times daily. 60 tablet 11  . spironolactone (ALDACTONE) 25 MG tablet Take 1 tablet (25 mg total) by mouth daily. 30 tablet 6  . traZODone (DESYREL) 100 MG tablet Take 200 mg by mouth at bedtime as needed for sleep.      No current facility-administered medications for this encounter.     Vitals:   08/20/17 1558  BP: 126/86  Pulse: 87  SpO2: 98%  Weight: 139 lb (63 kg)   Wt  Readings from Last 3 Encounters:  08/20/17 139 lb (63 kg)  07/09/17 140 lb (63.5 kg)  05/23/17 144 lb (65.3 kg)      EXAM: General:  Well appearing. No resp difficulty HEENT: normal Neck: supple. no JVD. Carotids 2+ bilat; no bruits. No lymphadenopathy or thryomegaly appreciated. Cor: PMI nondisplaced. Regular rate & rhythm. No rubs, gallops or murmurs. Lungs: clear Abdomen: soft, nontender, nondistended. No hepatosplenomegaly. No  bruits or masses. Good bowel sounds. Extremities: no cyanosis, clubbing, rash, edema Neuro: alert & orientedx3, cranial nerves grossly intact. moves all 4 extremities w/o difficulty. Affect pleasant   ASSESSMENT & PLAN: 1. Chronic Systolic Heart Failure: Nonischemic cardiomyopathy, ?viral cardiomyopathy. LHC 05/2016 no coronary disease. Echo 05/2016 EF 20-25% mod MR. ECHO 05/2017 EF 35-40%.  [employer] subcutaneous ICD.    -NYHA I - Continue Lasix 40 mg daily with KCl 20.  - Increase coreg 9.375 mg twice a day. I am not sure she is taking correctly because there are several pills left.  - Continue Entresto 49/51 mg BID.  - Continue spiro 25 mg daily.  - Continue digoxin 0.125 mg daily.  2. Mitral regurgitation: Only mild on last echo.  Follow up 3 months. Reinfotrced taking medications as prescribed.   Darrick Grinder, NP  08/20/2017

## 2017-09-17 ENCOUNTER — Emergency Department (HOSPITAL_COMMUNITY)
Admission: EM | Admit: 2017-09-17 | Discharge: 2017-09-17 | Disposition: A | Discharge status: Home / Self Care | Payer: 59 | Attending: Emergency Medicine | Admitting: Emergency Medicine

## 2017-09-17 ENCOUNTER — Other Ambulatory Visit: Payer: Self-pay

## 2017-09-17 ENCOUNTER — Encounter (HOSPITAL_COMMUNITY): Payer: Self-pay

## 2017-09-17 DIAGNOSIS — Z79899 Other long term (current) drug therapy: Secondary | ICD-10-CM | POA: Diagnosis not present

## 2017-09-17 DIAGNOSIS — M791 Myalgia, unspecified site: Secondary | ICD-10-CM | POA: Diagnosis not present

## 2017-09-17 DIAGNOSIS — Z87891 Personal history of nicotine dependence: Secondary | ICD-10-CM | POA: Diagnosis not present

## 2017-09-17 DIAGNOSIS — I5022 Chronic systolic (congestive) heart failure: Secondary | ICD-10-CM | POA: Diagnosis not present

## 2017-09-17 DIAGNOSIS — R51 Headache: Secondary | ICD-10-CM | POA: Diagnosis present

## 2017-09-17 DIAGNOSIS — Z7982 Long term (current) use of aspirin: Secondary | ICD-10-CM | POA: Diagnosis not present

## 2017-09-17 LAB — COMPREHENSIVE METABOLIC PANEL
ALT: 36 U/L (ref 14–54)
AST: 29 U/L (ref 15–41)
Albumin: 3.2 g/dL — ABNORMAL LOW (ref 3.5–5.0)
Alkaline Phosphatase: 133 U/L — ABNORMAL HIGH (ref 38–126)
Anion gap: 10 (ref 5–15)
BUN: 8 mg/dL (ref 6–20)
CO2: 24 mmol/L (ref 22–32)
Calcium: 8.5 mg/dL — ABNORMAL LOW (ref 8.9–10.3)
Chloride: 104 mmol/L (ref 101–111)
Creatinine, Ser: 0.55 mg/dL (ref 0.44–1.00)
GFR calc Af Amer: >60 mL/min (ref >60)
GFR calc non Af Amer: >60 mL/min (ref >60)
Glucose, Bld: 82 mg/dL (ref 65–99)
Potassium: 4.5 mmol/L (ref 3.5–5.1)
Sodium: 138 mmol/L (ref 135–145)
Total Bilirubin: 0.2 mg/dL — ABNORMAL LOW (ref 0.3–1.2)
Total Protein: 6.9 g/dL (ref 6.5–8.1)

## 2017-09-17 LAB — I-STAT TROPONIN, ED: Troponin i, poc: 0.05 ng/mL (ref 0.00–0.08)

## 2017-09-17 LAB — URINALYSIS, ROUTINE W REFLEX MICROSCOPIC
Bilirubin Urine: NEGATIVE
Glucose, UA: NEGATIVE mg/dL
Hgb urine dipstick: NEGATIVE
Ketones, ur: 5 mg/dL — AB
Nitrite: NEGATIVE
Protein, ur: 30 mg/dL — AB
Specific Gravity, Urine: 1.034 — ABNORMAL HIGH (ref 1.005–1.030)
pH: 5 (ref 5.0–8.0)

## 2017-09-17 LAB — CBC WITH DIFFERENTIAL/PLATELET
Abs Immature Granulocytes: 0.2 10*3/uL — ABNORMAL HIGH (ref 0.0–0.1)
Basophils Absolute: 0.1 10*3/uL (ref 0.0–0.1)
Basophils Relative: 1 %
Eosinophils Absolute: 0.1 10*3/uL (ref 0.0–0.7)
Eosinophils Relative: 1 %
HCT: 37 % (ref 36.0–46.0)
Hemoglobin: 11.9 g/dL — ABNORMAL LOW (ref 12.0–15.0)
Immature Granulocytes: 1 %
Lymphocytes Relative: 7 %
Lymphs Abs: 1.1 10*3/uL (ref 0.7–4.0)
MCH: 29.8 pg (ref 26.0–34.0)
MCHC: 32.2 g/dL (ref 30.0–36.0)
MCV: 92.5 fL (ref 78.0–100.0)
Monocytes Absolute: 0.4 10*3/uL (ref 0.1–1.0)
Monocytes Relative: 3 %
Neutro Abs: 14.8 10*3/uL — ABNORMAL HIGH (ref 1.7–7.7)
Neutrophils Relative %: 87 %
Platelets: 320 10*3/uL (ref 150–400)
RBC: 4 MIL/uL (ref 3.87–5.11)
RDW: 12.8 % (ref 11.5–15.5)
WBC: 16.7 10*3/uL — ABNORMAL HIGH (ref 4.0–10.5)

## 2017-09-17 LAB — PREGNANCY, URINE: Preg Test, Ur: NEGATIVE

## 2017-09-17 LAB — CK: Total CK: 52 U/L (ref 38–234)

## 2017-09-17 MED ORDER — KETOROLAC TROMETHAMINE 15 MG/ML IJ SOLN
15.0000 mg | Freq: Once | INTRAMUSCULAR | Status: AC
  Administered 2017-09-17: 15 mg via INTRAVENOUS
  Filled 2017-09-17: qty 1

## 2017-09-17 NOTE — ED Provider Notes (Signed)
[facility] EMERGENCY DEPARTMENT Provider Note   CSN: [id number] Arrival date & time: 09/17/17  1643     History   Chief Complaint Chief Complaint  Patient presents with  . Muscle Pain  . Headache    HPI Kathy Rosales is a 42 y.o. female.  42 year old female with prior history of CHF and anemia presents with complaint of diffuse muscle aches and pain.  Patient reports 2 weeks of persistent muscle aches and pain.  She denies any associated fever.  She denies any associated trauma.  She reports pain in the arms and legs and back.  She denies current chest pain or shortness of breath.  She denies nausea or vomiting.  She denies abdominal pain.  She reports that she has not yet seen her regular doctor about this particular problem.  Nuys prior episodes similar to this in the past.  She reports trying multiple over-the-counter pain medications without significant improvement.  The history is provided by the patient.  Muscle Pain  This is a new problem. The current episode started more than 1 week ago. The problem occurs daily. The problem has not changed since onset.Nothing aggravates the symptoms. Nothing relieves the symptoms. She has tried acetaminophen and ASA for the symptoms. The treatment provided no relief.    Past Medical History:  Diagnosis Date  . Acute systolic CHF (congestive heart failure) ([place]) 05/22/2016  . Blood transfusion without reported diagnosis   . 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 Left-sided weakness   . Unresponsive   . Hypokalemia 08/03/2016  . Chronic systolic heart failure ([place]) 05/22/2016  . Mitral regurgitation 05/22/2016  . Near syncope 05/22/2016  . Delirium due to multiple etiologies, persistent, mixed level of activity 12/13/2015  . MDD (major depressive disorder), single episode, moderate ([place]) 12/13/2015  . Opioid use disorder, mild, abuse ([place]) 12/13/2015    . UTI (urinary tract infection) 12/13/2015  . Fatigue 12/01/2015  . Orthostatic hypotension 12/01/2015  . Helicobacter positive gastritis 08/06/2015  . Gastric ulcer 08/06/2015  . Iron deficiency anemia due to chronic blood loss 08/06/2015  . Anemia 07/21/2015  . Iron deficiency anemia 10/22/2014  . B12 deficiency 10/22/2014  . Symptomatic anemia 08/28/2014  . Headache 08/28/2014  . Chest pain 08/28/2014  . 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 COLONOSCOPY WITH PROPOFOL N/A 07/23/2015   Procedure: COLONOSCOPY WITH PROPOFOL;  Surgeon: Ronald Lobo, MD;  Location: [facility] ENDOSCOPY;  Service: Endoscopy;  Laterality: N/A;  . ESOPHAGOGASTRODUODENOSCOPY (EGD) WITH PROPOFOL N/A 07/23/2015   Procedure: ESOPHAGOGASTRODUODENOSCOPY (EGD) WITH PROPOFOL;  Surgeon: Ronald Lobo, MD;  Location: [facility] ENDOSCOPY;  Service: Endoscopy;  Laterality: N/A;  . ESOPHAGOGASTRODUODENOSCOPY (EGD) WITH PROPOFOL N/A 11/18/2015   Procedure: ESOPHAGOGASTRODUODENOSCOPY (EGD) WITH PROPOFOL;  Surgeon: Ronald Lobo, MD;  Location: [facility] ENDOSCOPY;  Service: Endoscopy;  Laterality: N/A;  . RIGHT/LEFT HEART CATH AND CORONARY ANGIOGRAPHY N/A 05/22/2016   Procedure: Right/Left Heart Cath and Coronary Angiography;  Surgeon: Bhavana Kady M Martinique, MD;  Location: [facility] CV LAB;  Service: Cardiovascular;  Laterality: N/A;  . SUBQ ICD IMPLANT N/A 09/27/2016   Procedure: SubQ ICD Implant;  Surgeon: Evans Lance, MD;  Location: [facility] CV LAB;  Service: Cardiovascular;  Laterality: N/A;     OB History   None      Home Medications    Prior to Admission medications   Medication  Sig Start Date End Date Taking? Authorizing Provider  aspirin EC 81 MG tablet Take 1 tablet (81 mg total) by mouth daily. 05/15/16  Yes Lelon Perla, MD  Aspirin-Salicylamide-Caffeine (BC HEADACHE PO) Take 1 packet by mouth 4 (four) times daily as needed (headaches).   Yes [provider]   carvedilol (COREG) 6.25 MG tablet Take 1.5 tablets (9.375 mg total) by mouth 2 (two) times daily with a meal. 08/20/17  Yes Clegg, Amy D, NP  digoxin (LANOXIN) 0.125 MG tablet TAKE 1 TABLET BY MOUTH DAILY. 06/25/17  Yes Bensimhon, Shaune Pascal, MD  furosemide (LASIX) 20 MG tablet Take 2 tablets (40 mg total) by mouth daily. 05/23/17  Yes Shirley Friar, PA-C  PARoxetine (PAXIL) 20 MG tablet Take 20 mg by mouth daily.   Yes [provider]  Potassium Chloride ER 20 MEQ TBCR Take 20 mEq by mouth daily. 05/23/17  Yes Shirley Friar, PA-C  riboflavin (VITAMIN B-2) 100 MG TABS tablet Take 200 mg by mouth daily.   Yes [provider]  sacubitril-valsartan (ENTRESTO) 49-51 MG Take 1 tablet by mouth 2 (two) times daily. 05/23/17  Yes Shirley Friar, PA-C  spironolactone (ALDACTONE) 25 MG tablet Take 1 tablet (25 mg total) by mouth daily. 03/19/17  Yes Clegg, Amy D, NP  traZODone (DESYREL) 100 MG tablet Take 200 mg by mouth at bedtime as needed for sleep.    Yes [provider]  artificial tears (LACRILUBE) OINT ophthalmic ointment Place into both eyes every 4 (four) hours as needed for dry eyes. Patient not taking: Reported on 09/17/2017 04/29/17   Dossie Der    Family History Family History  Problem Relation Age of Onset  . Cancer Paternal Grandfather   . Mental illness Neg Hx     Social History Social History   Tobacco Use  . Smoking status: Former Research scientist (life sciences)  . Smokeless tobacco: Never Used  Substance Use Topics  . Alcohol use: No  . Drug use: No     Allergies   Tramadol; Furosemide; and Leflunomide   Review of Systems Review of Systems  Constitutional: Positive for fatigue.       Patient complains of diffuse myalgia  All other systems reviewed and are negative.    Physical Exam Updated Vital Signs BP 119/70   Pulse 93   Temp 99.1 F (37.3 C) (Oral)   Resp 17   Ht 5\' 5"  (1.651 m)   Wt 61.2 kg (135 lb)   LMP  08/20/2017   SpO2 100%   BMI 22.47 kg/m   Physical Exam  Constitutional: She is oriented to person, place, and time. She appears well-developed and well-nourished. No distress.  HENT:  Head: Normocephalic and atraumatic.  Mouth/Throat: Oropharynx is clear and moist.  Eyes: Pupils are equal, round, and reactive to light. Conjunctivae and EOM are normal.  Neck: Normal range of motion. Neck supple.  Cardiovascular: Normal rate, regular rhythm and normal heart sounds.  Pulmonary/Chest: Effort normal and breath sounds normal. No respiratory distress.  Abdominal: Soft. She exhibits no distension. There is no tenderness.  Musculoskeletal: Normal range of motion. She exhibits no edema or deformity.  Neurological: She is alert and oriented to person, place, and time.  Alert and oriented x3. Normal speech  No facial droop   5 out of 5 strength in both upper and both lower extremity     Skin: Skin is warm and dry.  Psychiatric: She has a normal mood and affect.  Nursing note and vitals reviewed.    ED Treatments / Results  Labs (all labs ordered are listed, but only abnormal results are displayed) Labs Reviewed  URINALYSIS, ROUTINE W REFLEX MICROSCOPIC - Abnormal; Notable for the following components:      Result Value   Color, Urine AMBER (*)    APPearance HAZY (*)    Specific Gravity, Urine 1.034 (*)    Ketones, ur 5 (*)    Protein, ur 30 (*)    Leukocytes, UA TRACE (*)    Bacteria, UA RARE (*)    All other components within normal limits  CBC WITH DIFFERENTIAL/PLATELET - Abnormal; Notable for the following components:   WBC 16.7 (*)    Hemoglobin 11.9 (*)    Neutro Abs 14.8 (*)    Abs Immature Granulocytes 0.2 (*)    All other components within normal limits  COMPREHENSIVE METABOLIC PANEL - Abnormal; Notable for the following components:   Calcium 8.5 (*)    Albumin 3.2 (*)    Alkaline Phosphatase 133 (*)    Total Bilirubin 0.2 (*)    All other components within  normal limits  PREGNANCY, URINE  CK  I-STAT TROPONIN, ED    EKG EKG Interpretation  Date/Time:  Monday September 17 2017 21:44:40 EDT Ventricular Rate:  73 PR Interval:    QRS Duration: 83 QT Interval:  371 QTC Calculation: 409 R Axis:   52 Text Interpretation:  Sinus rhythm Minimal ST depression, inferior leads Confirmed by Dene Gentry [id number]) on 09/17/2017 9:49:17 PM   Radiology No results found.  Procedures Procedures (including critical care time)  Medications Ordered in ED Medications  ketorolac (TORADOL) 15 MG/ML injection 15 mg (15 mg Intravenous Given 09/17/17 1935)     Initial Impression / Assessment and Plan / ED Course  I have reviewed the triage vital signs and the nursing notes.  Pertinent labs & imaging results that were available during my care of the patient were reviewed by me and considered in my medical decision making (see chart for details).     MDM  Screen complete  Patient is presenting for evaluation of diffuse myalgia.  Patient's vitals and initial screening laboratory evaluations are without significant abnormality.  White count is mildly elevated but there is no evidence of concurrent infection.  Patient feels moderately improved following administration of Toradol.  She understands the need for close follow-up.  She is advised that further work-up may include evaluation by rheumatology.  Strict return precautions are given and understood. Final Clinical Impressions(s) / ED Diagnoses   Final diagnoses:  Myalgia    ED Discharge Orders    None       Valarie Merino, MD 09/17/17 2244

## 2017-09-17 NOTE — ED Triage Notes (Signed)
Pt c/o muscle aches and headache for a couple of weeks. States she has also had some intermittent sweating.

## 2017-09-17 NOTE — Discharge Instructions (Signed)
Please return for any problem.  Follow-up with your regular doctor tomorrow.  You may require further outpatient work-up or treatment by a rheumatologist.

## 2017-09-17 NOTE — ED Notes (Signed)
ED Provider at bedside. 

## 2017-09-17 NOTE — ED Notes (Signed)
Urine specimen cup given to pt in lobby

## 2017-09-17 NOTE — ED Notes (Signed)
Pt verbalized understanding of discharge instrucitons

## 2017-09-17 NOTE — ED Provider Notes (Signed)
Patient placed in Quick Look pathway, seen and evaluated   Chief Complaint: myalgias  HPI:   Pt complains of body aches for the past 2 weeks  ROS:  Fever, no cough  Physical Exam:   Gen: No distress  Neuro: Awake and Alert  Skin: Warm    Focused Exam: RRR, resp rate normal   Initiation of care has begun. The patient has been counseled on the process, plan, and necessity for staying for the completion/evaluation, and the remainder of the medical screening examination   Sidney Ace 09/17/17 1721    Valarie Merino, MD 09/17/17 2358

## 2017-09-17 NOTE — ED Notes (Signed)
Pt states pain medication did not work.

## 2017-09-18 ENCOUNTER — Encounter: Payer: Self-pay | Admitting: Internal Medicine

## 2017-09-18 ENCOUNTER — Ambulatory Visit: Payer: 59 | Attending: Internal Medicine | Admitting: Internal Medicine

## 2017-09-18 ENCOUNTER — Encounter (HOSPITAL_COMMUNITY): Payer: Self-pay

## 2017-09-18 VITALS — BP 108/71 | HR 78 | Temp 98.0°F | Resp 16 | Wt 138.6 lb

## 2017-09-18 DIAGNOSIS — Z09 Encounter for follow-up examination after completed treatment for conditions other than malignant neoplasm: Secondary | ICD-10-CM | POA: Diagnosis present

## 2017-09-18 DIAGNOSIS — Z888 Allergy status to other drugs, medicaments and biological substances status: Secondary | ICD-10-CM | POA: Diagnosis not present

## 2017-09-18 DIAGNOSIS — I951 Orthostatic hypotension: Secondary | ICD-10-CM | POA: Insufficient documentation

## 2017-09-18 DIAGNOSIS — Z809 Family history of malignant neoplasm, unspecified: Secondary | ICD-10-CM | POA: Diagnosis not present

## 2017-09-18 DIAGNOSIS — Z9889 Other specified postprocedural states: Secondary | ICD-10-CM | POA: Insufficient documentation

## 2017-09-18 DIAGNOSIS — Z79899 Other long term (current) drug therapy: Secondary | ICD-10-CM | POA: Diagnosis not present

## 2017-09-18 DIAGNOSIS — E876 Hypokalemia: Secondary | ICD-10-CM | POA: Diagnosis not present

## 2017-09-18 DIAGNOSIS — D500 Iron deficiency anemia secondary to blood loss (chronic): Secondary | ICD-10-CM | POA: Diagnosis not present

## 2017-09-18 DIAGNOSIS — E538 Deficiency of other specified B group vitamins: Secondary | ICD-10-CM | POA: Insufficient documentation

## 2017-09-18 DIAGNOSIS — M791 Myalgia, unspecified site: Secondary | ICD-10-CM | POA: Diagnosis not present

## 2017-09-18 DIAGNOSIS — Z7982 Long term (current) use of aspirin: Secondary | ICD-10-CM | POA: Diagnosis not present

## 2017-09-18 DIAGNOSIS — F329 Major depressive disorder, single episode, unspecified: Secondary | ICD-10-CM | POA: Insufficient documentation

## 2017-09-18 DIAGNOSIS — I429 Cardiomyopathy, unspecified: Secondary | ICD-10-CM | POA: Insufficient documentation

## 2017-09-18 DIAGNOSIS — I5022 Chronic systolic (congestive) heart failure: Secondary | ICD-10-CM | POA: Diagnosis not present

## 2017-09-18 DIAGNOSIS — Z95810 Presence of automatic (implantable) cardiac defibrillator: Secondary | ICD-10-CM | POA: Insufficient documentation

## 2017-09-18 DIAGNOSIS — G4489 Other headache syndrome: Secondary | ICD-10-CM | POA: Insufficient documentation

## 2017-09-18 DIAGNOSIS — R4182 Altered mental status, unspecified: Secondary | ICD-10-CM | POA: Insufficient documentation

## 2017-09-18 MED ORDER — METHOCARBAMOL 500 MG PO TABS: 500.0000 mg | ORAL_TABLET | Freq: Two times a day (BID) | ORAL | 0 refills | Status: DC | PRN

## 2017-09-18 MED ORDER — TOPIRAMATE 25 MG PO TABS: 25.0000 mg | ORAL_TABLET | Freq: Every day | ORAL | 0 refills | Status: DC

## 2017-09-18 MED ORDER — KETOROLAC TROMETHAMINE 30 MG/ML IJ SOLN
30.0000 mg | Freq: Once | INTRAMUSCULAR | Status: AC
  Administered 2017-09-18: 30 mg via INTRAMUSCULAR

## 2017-09-18 NOTE — Progress Notes (Signed)
Pt states she is very fatigue   Pt states she has some extreme pain in her muscle  Pt states she doesn't have any strength  Pt states her symptoms has been going on for 2 weeks but yesterday and today has been the worst

## 2017-09-18 NOTE — Progress Notes (Signed)
Patient ID: Kathy Rosales, female    DOB: 1975-05-02  MRN: [id number]  CC: Hospitalization Follow-up (ED) and Fatigue   Subjective: Kathy Rosales is a 42 y.o. female who presents for UC visit.  Female friend is with her.  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  It has been worse over the past 2 days.  She feels decreased strength in her arms and legs with the pain. No jt pains or swelling.  Denies any upper respiratory symptoms or any other initiating factors.  Pain mainly in her extremities and back all the way from the neck down to the lower back.  No numbness or tingling, fever.  No new medications and no over-the-counter herbal medicines.  -Symptoms have been associated with very strong headache that starts at the back of the head and neck and spreads forward.  Strongest in the frontal area.  No associated blurred vision, photophobia, nausea/vomiting.  No initiating factors.  Worse when she coughs.  No worse with foods or stress.  Headaches started about 2 to 3 weeks ago and were intermittent.  More intense over the past 3 days. Headaches build in intensity and can last all day.  Denies any previous history of headaches. She has been taking BCs, Aleve and Advil with very little relief.  In the ER yesterday urine pregnancy test was negative, UA negative for infection.  CK level normal.  Chemistry revealed slightly low calcium of 8.5 with low albumin of 3.2, ALK Phos 133.  CBC revealed WBC of 16.7 with neutrophil 14.8, hemoglobin of 11.9    Patient Active Problem List   Diagnosis Date Noted  . Left-sided weakness   . Unresponsive   . Hypokalemia 08/03/2016  . Chronic systolic heart failure ([place]) 05/22/2016  . Mitral regurgitation 05/22/2016  . Near  syncope 05/22/2016  . Delirium due to multiple etiologies, persistent, mixed level of activity 12/13/2015  . MDD (major depressive disorder), single episode, moderate ([place]) 12/13/2015  . Opioid use disorder, mild, abuse ([place]) 12/13/2015  . UTI (urinary tract infection) 12/13/2015  . Fatigue 12/01/2015  . Orthostatic hypotension 12/01/2015  . Helicobacter positive gastritis 08/06/2015  . Gastric ulcer 08/06/2015  . Iron deficiency anemia due to chronic blood loss 08/06/2015  . Anemia 07/21/2015  . Iron deficiency anemia 10/22/2014  . B12 deficiency 10/22/2014  . Symptomatic anemia 08/28/2014  . Headache 08/28/2014  . Chest pain 08/28/2014  . 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 artificial tears (LACRILUBE) OINT ophthalmic ointment Place into both eyes every 4 (four) hours as needed for dry eyes. (Patient not taking: Reported on 09/17/2017) 5 g 0  . aspirin EC 81 MG tablet Take 1 tablet (81 mg total) by mouth daily. 90 tablet 3  . Aspirin-Salicylamide-Caffeine (BC HEADACHE PO) Take 1 packet by mouth 4 (four) times daily as needed (headaches).    . carvedilol (COREG) 6.25 MG tablet Take 1.5 tablets (9.375 mg total) by mouth 2 (two) times daily with a meal. 90 tablet 11  . digoxin (LANOXIN) 0.125 MG tablet TAKE 1 TABLET BY MOUTH DAILY. 90 tablet 2  . furosemide (LASIX) 20 MG tablet Take 2 tablets (40 mg total) by mouth  daily. 180 tablet 3  . PARoxetine (PAXIL) 20 MG tablet Take 20 mg by mouth daily.    . Potassium Chloride ER 20 MEQ TBCR Take 20 mEq by mouth daily. 90 tablet 3  . riboflavin (VITAMIN B-2) 100 MG TABS tablet Take 200 mg by mouth daily.    . sacubitril-valsartan (ENTRESTO) 49-51 MG Take 1 tablet by mouth 2 (two) times daily. 60 tablet 11  . spironolactone (ALDACTONE) 25 MG tablet Take 1 tablet (25 mg total) by mouth daily. 30 tablet 6  . traZODone (DESYREL) 100 MG tablet Take 200 mg by  mouth at bedtime as needed for sleep.      No current facility-administered medications on file prior to visit.     Allergies  Allergen Reactions  . Tramadol Itching and Other (See Comments)    Red spots all over body and itching  . Furosemide Rash  . Leflunomide Rash    Social History   Socioeconomic History  . Marital status: Married    Spouse name: Not on file  . Number of children: 1  . Years of education: Not on file  . Highest education level: Not on file  Occupational History  . Occupation: unemployeed  Social Needs  . Financial resource strain: Not on file  . Food insecurity:    Worry: Not on file    Inability: Not on file  . Transportation needs:    Medical: Not on file    Non-medical: Not on file  Tobacco Use  . Smoking status: Never Smoker  . Smokeless tobacco: Never Used  Substance and Sexual Activity  . Alcohol use: No  . Drug use: No  . Sexual activity: Never    Birth control/protection: None  Lifestyle  . Physical activity:    Days per week: Not on file    Minutes per session: Not on file  . Stress: Not on file  Relationships  . Social connections:    Talks on phone: Not on file    Gets together: Not on file    Attends religious service: Not on file    Active member of club or organization: Not on file    Attends meetings of clubs or organizations: Not on file    Relationship status: Not on file  . Intimate partner violence:    Fear of current or ex partner: Not on file    Emotionally abused: Not on file    Physically abused: Not on file    Forced sexual activity: Not on file  Other Topics Concern  . Not on file  Social History Narrative   ** Merged History Encounter **       ** Merged History Encounter **        Family History  Problem Relation Age of Onset  . Cancer Paternal Grandfather   . Mental illness Neg Hx     Past Surgical History:  Procedure Laterality Date  . COLONOSCOPY WITH PROPOFOL N/A 07/23/2015   Procedure:  COLONOSCOPY WITH PROPOFOL;  Surgeon: Ronald Lobo, MD;  Location: [facility];  Service: Endoscopy;  Laterality: N/A;  . ESOPHAGOGASTRODUODENOSCOPY (EGD) WITH PROPOFOL N/A 07/23/2015   Procedure: ESOPHAGOGASTRODUODENOSCOPY (EGD) WITH PROPOFOL;  Surgeon: Ronald Lobo, MD;  Location: [facility];  Service: Endoscopy;  Laterality: N/A;  . ESOPHAGOGASTRODUODENOSCOPY (EGD) WITH PROPOFOL N/A 11/18/2015   Procedure: ESOPHAGOGASTRODUODENOSCOPY (EGD) WITH PROPOFOL;  Surgeon: Ronald Lobo, MD;  Location: [facility];  Service: Endoscopy;  Laterality: N/A;  . RIGHT/LEFT HEART CATH AND CORONARY ANGIOGRAPHY N/A 05/22/2016  Procedure: Right/Left Heart Cath and Coronary Angiography;  Surgeon: Peter M Martinique, MD;  Location: [facility];  Service: Cardiovascular;  Laterality: N/A;  . SUBQ ICD IMPLANT N/A 09/27/2016   Procedure: SubQ ICD Implant;  Surgeon: Evans Lance, MD;  Location: [facility];  Service: Cardiovascular;  Laterality: N/A;    ROS: Review of Systems Negative except as stated above PHYSICAL EXAM: BP 108/71   Pulse 78   Temp 98 F (36.7 C) (Oral)   Resp 16   Wt 138 lb 9.6 oz (62.9 kg)   LMP 08/20/2017   SpO2 99%   BMI 23.06 kg/m   Physical Exam  General appearance - alert, well appearing, middle age female and in no distress Mental status - normal mood, behavior, speech, dress, motor activity, and thought processes Eyes -pink conjunctiva.  Nonicteric sclera Mouth - mucous membranes moist, pharynx normal without lesions Neck - supple, no significant adenopathy.  No thyroid enlargement Chest - clear to auscultation, no wheezes, rales or rhonchi, symmetric air entry Heart - normal rate, regular rhythm, normal S1, S2, no murmurs, Neurological - cranial nerves II through XII intact.  Gross sensation intact.  Power 4/ 5 throughout.  Limited due to pain Skin -no rashes seen on extremities or around neck line. MSK: Muscle groups in upper arms and lower legs tender to  touch   ASSESSMENT AND PLAN: 1. Myalgia Diffuse myalgia.  ? Etiology. We will check some additional blood tests including TSH, digoxin level, sed rate and ANA.  I have given some Robaxin to use as needed - Digoxin level - TSH - VITAMIN D 25 Hydroxy (Vit-D Deficiency, Fractures) - Sedimentation Rate - ANA w/Reflex if Positive - methocarbamol (ROBAXIN) 500 MG tablet; Take 1 tablet (500 mg total) by mouth 2 (two) times daily as needed for muscle spasms.  Dispense: 20 tablet; Refill: 0  2. Headache syndrome -Difficult time trying to bring everything under one unifying diagnosis of diffuse myalgia and headache.  We will give her Toradol shot for now and try her with a low dose of Topamax at bedtime - ketorolac (TORADOL) 30 MG/ML injection 30 mg - topiramate (TOPAMAX) 25 MG tablet; Take 1 tablet (25 mg total) by mouth at bedtime.  Dispense: 30 tablet; Refill: 0  Patient was given the opportunity to ask questions.  Patient verbalized understanding of the plan and was able to repeat key elements of the plan.   Orders Placed This Encounter  Procedures  . Digoxin level  . TSH  . VITAMIN D 25 Hydroxy (Vit-D Deficiency, Fractures)  . Sedimentation Rate  . ANA w/Reflex if Positive     Requested Prescriptions   Signed Prescriptions Disp Refills  . methocarbamol (ROBAXIN) 500 MG tablet 20 tablet 0    Sig: Take 1 tablet (500 mg total) by mouth 2 (two) times daily as needed for muscle spasms.  Marland Kitchen topiramate (TOPAMAX) 25 MG tablet 30 tablet 0    Sig: Take 1 tablet (25 mg total) by mouth at bedtime.    Return if symptoms worsen or fail to improve.  Karle Plumber, MD, FACP

## 2017-09-19 LAB — DIGOXIN LEVEL: Digoxin, Serum: <0.4 ng/mL — ABNORMAL LOW (ref 0.5–0.9)

## 2017-09-19 LAB — VITAMIN B12: Vitamin B-12: 1435 pg/mL — ABNORMAL HIGH (ref 232–1245)

## 2017-09-19 LAB — TSH: TSH: 1.67 u[IU]/mL (ref 0.450–4.500)

## 2017-09-19 LAB — ANA W/REFLEX IF POSITIVE: Anti Nuclear Antibody(ANA): NEGATIVE

## 2017-09-19 LAB — SEDIMENTATION RATE: Sed Rate: 83 mm/hr — ABNORMAL HIGH (ref 0–32)

## 2017-09-19 LAB — SPECIMEN STATUS REPORT

## 2017-09-19 LAB — VITAMIN D 25 HYDROXY (VIT D DEFICIENCY, FRACTURES): Vit D, 25-Hydroxy: 19.2 ng/mL — ABNORMAL LOW (ref 30.0–100.0)

## 2017-09-26 ENCOUNTER — Telehealth: Payer: Self-pay

## 2017-09-26 NOTE — Telephone Encounter (Signed)
[facility] Id# [id number] Contacted pt to go over lab results pt didn't answer lvm asking pt to give me a call at her earliest convenience

## 2017-10-10 ENCOUNTER — Other Ambulatory Visit: Payer: Self-pay | Admitting: Cardiology

## 2017-10-15 ENCOUNTER — Ambulatory Visit: Payer: Self-pay | Admitting: Internal Medicine

## 2017-10-19 ENCOUNTER — Encounter: Payer: Self-pay | Admitting: Internal Medicine

## 2017-10-19 ENCOUNTER — Ambulatory Visit: Payer: 59 | Attending: Internal Medicine | Admitting: Internal Medicine

## 2017-10-19 ENCOUNTER — Other Ambulatory Visit: Payer: Self-pay | Admitting: Internal Medicine

## 2017-10-19 VITALS — BP 117/83 | HR 106 | Temp 98.7°F | Resp 16 | Wt 136.6 lb

## 2017-10-19 DIAGNOSIS — D500 Iron deficiency anemia secondary to blood loss (chronic): Secondary | ICD-10-CM | POA: Insufficient documentation

## 2017-10-19 DIAGNOSIS — R945 Abnormal results of liver function studies: Secondary | ICD-10-CM | POA: Diagnosis not present

## 2017-10-19 DIAGNOSIS — I5022 Chronic systolic (congestive) heart failure: Secondary | ICD-10-CM | POA: Diagnosis not present

## 2017-10-19 DIAGNOSIS — Z955 Presence of coronary angioplasty implant and graft: Secondary | ICD-10-CM | POA: Insufficient documentation

## 2017-10-19 DIAGNOSIS — R7989 Other specified abnormal findings of blood chemistry: Secondary | ICD-10-CM

## 2017-10-19 DIAGNOSIS — Z79899 Other long term (current) drug therapy: Secondary | ICD-10-CM | POA: Diagnosis not present

## 2017-10-19 DIAGNOSIS — Z7982 Long term (current) use of aspirin: Secondary | ICD-10-CM | POA: Insufficient documentation

## 2017-10-19 DIAGNOSIS — E538 Deficiency of other specified B group vitamins: Secondary | ICD-10-CM | POA: Insufficient documentation

## 2017-10-19 DIAGNOSIS — Z9889 Other specified postprocedural states: Secondary | ICD-10-CM | POA: Insufficient documentation

## 2017-10-19 DIAGNOSIS — Z888 Allergy status to other drugs, medicaments and biological substances status: Secondary | ICD-10-CM | POA: Insufficient documentation

## 2017-10-19 DIAGNOSIS — Z5181 Encounter for therapeutic drug level monitoring: Secondary | ICD-10-CM | POA: Insufficient documentation

## 2017-10-19 DIAGNOSIS — G4489 Other headache syndrome: Secondary | ICD-10-CM

## 2017-10-19 NOTE — Progress Notes (Signed)
Patient ID: Kathy Rosales, female    DOB: 1975-08-16  MRN: [id number]  CC: Medication Management   Subjective: Kathy Rosales is a 42 y.o. female who presents for urgent care visit. 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  Blood test revealed normal TSH and ANA but elevated ESR of 83.  We attempted to reach patient with these results.  Patient states that she did try to call back but was unable to get through.  She was seen by [facility] rheumatology about 2 weeks ago.  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  However blood tests that were done there revealed abnormal liver function tests.  She states that the rheumatologist wanted her to follow-up with me first to address the abnormal LFTs.  He reportedly had sent the results to me but I have not seen them.  Of note, patient had blood chemistry done through the ER last month a few days before she saw me.  LFTs look good except for slight elevation in alk phos.  Today patient states that she is feeling well.  Myalgias have resolved.  She reports that after she saw me she developed some pain in the knees and ankles along with a rash on the upper and lower extremities.  However all symptoms have resolved. Saw Rheumatology, 2 wks ago.     Patient Active Problem List   Diagnosis Date Noted  . Left-sided weakness   . Unresponsive   . Chronic systolic heart failure ([place]) 05/22/2016  . Mitral regurgitation 05/22/2016  . Near syncope 05/22/2016  . Delirium due to multiple etiologies, persistent, mixed level of activity 12/13/2015  . MDD (major depressive disorder), single episode, moderate ([place]) 12/13/2015  . Fatigue 12/01/2015  . Helicobacter  positive gastritis 08/06/2015  . Gastric ulcer 08/06/2015  . Iron deficiency anemia due to chronic blood loss 08/06/2015  . Anemia 07/21/2015  . Iron deficiency anemia 10/22/2014  . B12 deficiency 10/22/2014  . Symptomatic anemia 08/28/2014  . Headache 08/28/2014  . 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 artificial tears (LACRILUBE) OINT ophthalmic ointment Place into both eyes every 4 (four) hours as needed for dry eyes. (Patient not taking: Reported on 09/17/2017) 5 g 0  . aspirin EC 81 MG tablet Take 1 tablet (81 mg total) by mouth daily. 90 tablet 3  . Aspirin-Salicylamide-Caffeine (BC HEADACHE PO) Take 1 packet by mouth 4 (four) times daily as needed (headaches).    . carvedilol (COREG) 6.25 MG tablet Take 1.5 tablets (9.375 mg total) by mouth 2 (two) times daily with a meal. 90 tablet 11  . digoxin (LANOXIN) 0.125 MG tablet TAKE 1 TABLET BY MOUTH DAILY. 90 tablet 2  . furosemide (LASIX) 20 MG tablet Take 2 tablets (40 mg total) by mouth daily. 180 tablet 3  . methocarbamol (ROBAXIN) 500 MG tablet Take 1 tablet (500 mg total) by mouth 2 (two) times daily as needed for muscle spasms. 20 tablet 0  . PARoxetine (PAXIL) 20 MG tablet Take 20 mg by mouth daily.    . Potassium Chloride ER 20 MEQ TBCR Take 20 mEq by mouth daily. [place]  tablet 3  . riboflavin (VITAMIN B-2) 100 MG TABS tablet Take 200 mg by mouth daily.    . sacubitril-valsartan (ENTRESTO) 49-51 MG Take 1 tablet by mouth 2 (two) times daily. 60 tablet 11  . spironolactone (ALDACTONE) 25 MG tablet Take 1 tablet (25 mg total) by mouth daily. 30 tablet 6  . traZODone (DESYREL) 100 MG tablet Take 200 mg by mouth at bedtime as needed for sleep.      No current facility-administered medications on file prior to visit.     Allergies  Allergen Reactions  . Tramadol Itching and Other (See Comments)    Red spots all over body and itching  .  Furosemide Rash  . Leflunomide Rash    Social History   Socioeconomic History  . Marital status: Married    Spouse name: Not on file  . Number of children: 1  . Years of education: Not on file  . Highest education level: Not on file  Occupational History  . Occupation: unemployeed  Social Needs  . Financial resource strain: Not on file  . Food insecurity:    Worry: Not on file    Inability: Not on file  . Transportation needs:    Medical: Not on file    Non-medical: Not on file  Tobacco Use  . Smoking status: Never Smoker  . Smokeless tobacco: Never Used  Substance and Sexual Activity  . Alcohol use: No  . Drug use: No  . Sexual activity: Never    Birth control/protection: None  Lifestyle  . Physical activity:    Days per week: Not on file    Minutes per session: Not on file  . Stress: Not on file  Relationships  . Social connections:    Talks on phone: Not on file    Gets together: Not on file    Attends religious service: Not on file    Active member of club or organization: Not on file    Attends meetings of clubs or organizations: Not on file    Relationship status: Not on file  . Intimate partner violence:    Fear of current or ex partner: Not on file    Emotionally abused: Not on file    Physically abused: Not on file    Forced sexual activity: Not on file  Other Topics Concern  . Not on file  Social History Narrative   ** Merged History Encounter **       ** Merged History Encounter **        Family History  Problem Relation Age of Onset  . Cancer Paternal Grandfather   . Mental illness Neg Hx     Past Surgical History:  Procedure Laterality Date  . COLONOSCOPY WITH PROPOFOL N/A 07/23/2015   Procedure: COLONOSCOPY WITH PROPOFOL;  Surgeon: Ronald Lobo, MD;  [facility];  Service: Endoscopy;  Laterality: N/A;  . ESOPHAGOGASTRODUODENOSCOPY (EGD) WITH PROPOFOL N/A 07/23/2015   Procedure: ESOPHAGOGASTRODUODENOSCOPY (EGD) WITH PROPOFOL;   Surgeon: Ronald Lobo, MD;  [facility];  Service: Endoscopy;  Laterality: N/A;  . ESOPHAGOGASTRODUODENOSCOPY (EGD) WITH PROPOFOL N/A 11/18/2015   Procedure: ESOPHAGOGASTRODUODENOSCOPY (EGD) WITH PROPOFOL;  Surgeon: Ronald Lobo, MD;  [facility];  Service: Endoscopy;  Laterality: N/A;  . RIGHT/LEFT HEART CATH AND CORONARY ANGIOGRAPHY N/A 05/22/2016   Procedure: Right/Left Heart Cath and Coronary Angiography;  Surgeon: Peter M Martinique, MD;  Location: [facility];  Service: Cardiovascular;  Laterality: N/A;  . SUBQ ICD IMPLANT N/A 09/27/2016  Procedure: SubQ ICD Implant;  Surgeon: Evans Lance, MD;  Location: [facility];  Service: Cardiovascular;  Laterality: N/A;    ROS: Review of Systems Negative except as stated above. PHYSICAL EXAM: BP 117/83   Pulse (!) 106   Temp 98.7 F (37.1 C) (Oral)   Resp 16   Wt 136 lb 9.6 oz (62 kg)   SpO2 98%   BMI 22.73 kg/m   Physical Exam  General appearance - alert, well appearing, and in no distress Mental status - alert, oriented to person, place, and time, normal mood, behavior, speech, dress, motor activity, and thought processes Chest - clear to auscultation, no wheezes, rales or rhonchi, symmetric air entry Heart - normal rate, regular rhythm, normal S1, S2, no murmurs, rubs, clicks or gallops Abdomen - soft, nontender, nondistended, no masses or organomegaly  ASSESSMENT AND PLAN: 1. Abnormal LFTs Reported abnormal LFTs.  I did have my CMA call the rheumatologist office but they have closed already for Friday.  She will try to reach them on Monday to get a copy of the rheumatologist note and labs for me to review.  Of note, elevated LFTs can be seen in acute onset stills disease.  Patient is agreeable to having LFTs drawn today.  Further management will be based on results. - Hepatic Function Panel   Patient was given the opportunity to ask questions.  Patient verbalized understanding of the plan and  was able to repeat key elements of the plan.   Orders Placed This Encounter  Procedures  . Hepatic Function Panel     Requested Prescriptions    No prescriptions requested or ordered in this encounter    No follow-ups on file.  Karle Plumber, MD, FACP

## 2017-10-20 LAB — HEPATIC FUNCTION PANEL
ALT: 16 IU/L (ref 0–32)
AST: 25 IU/L (ref 0–40)
Albumin: 3.9 g/dL (ref 3.5–5.5)
Alkaline Phosphatase: 141 IU/L — ABNORMAL HIGH (ref 39–117)
Bilirubin Total: <0.2 mg/dL (ref 0.0–1.2)
Bilirubin, Direct: 0.07 mg/dL (ref 0.00–0.40)
Total Protein: 7.2 g/dL (ref 6.0–8.5)

## 2017-10-20 NOTE — Addendum Note (Signed)
Addended by: Karle Plumber B on: 10/20/2017 12:50 PM   Modules accepted: Orders

## 2017-10-20 NOTE — Progress Notes (Signed)
Mild elevation in Alk Phos.  Will add GGT

## 2017-10-22 ENCOUNTER — Telehealth: Payer: Self-pay

## 2017-10-22 NOTE — Telephone Encounter (Signed)
Contacted pt to go over lab results pt is aware and doesn't have any questions or concerns 

## 2017-10-31 ENCOUNTER — Telehealth: Payer: Self-pay | Admitting: Internal Medicine

## 2017-10-31 NOTE — Telephone Encounter (Signed)
Received note from [place], [facility]. 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  Currently on Diclofenac 75 mg BID. Tried with Arava in past but this caused rash. No response with Embrel.  On Prednisone ? 5 mg daily.  They discussed trying her with Simponi but AST/ALT/Alk phos were 59/97/190. Hepatitis screen was negative.  I will have my CMA inform pt that her LFTs have just about return to nl. Will forward copy of results to [facility] Rheumatology.  Results for orders placed or performed in visit on 10/19/17  Hepatic Function Panel  Result Value Ref Range   Total Protein 7.2 6.0 - 8.5 g/dL   Albumin 3.9 3.5 - 5.5 g/dL   Bilirubin Total <0.2 0.0 - 1.2 mg/dL   Bilirubin, Direct 0.07 0.00 - 0.40 mg/dL   Alkaline Phosphatase 141 (H) 39 - 117 IU/L   AST 25 0 - 40 IU/L   ALT 16 0 - 32 IU/L

## 2017-11-02 IMAGING — CT CT ABD-PELV W/ CM
2 of 5 series · 15 of 46 positions shown, 17 images · IV contrast (ISOVUE)
Comparison: Pelvic ultrasound and CT of the abdomen and pelvis
performed 12/09/2015

CLINICAL DATA: Acute onset of sensation of epigastric abdominal
mass, with swelling. Tenderness to palpation. Initial encounter.

EXAM:
CT ABDOMEN AND PELVIS WITH CONTRAST
TECHNIQUE: Multidetector CT imaging of the abdomen and pelvis was performed
using the standard protocol following bolus administration of
intravenous contrast.
CONTRAST:  100mL [id number] IOPAMIDOL (CX0AJZ-XVV) INJECTION 61%

[Series 2: abd/pel with · axial · 0.70mm/px · z∈[-493,-118]mm · 12 of 87 slices shown, 14 images]
[im 6/87  soft-tissue]
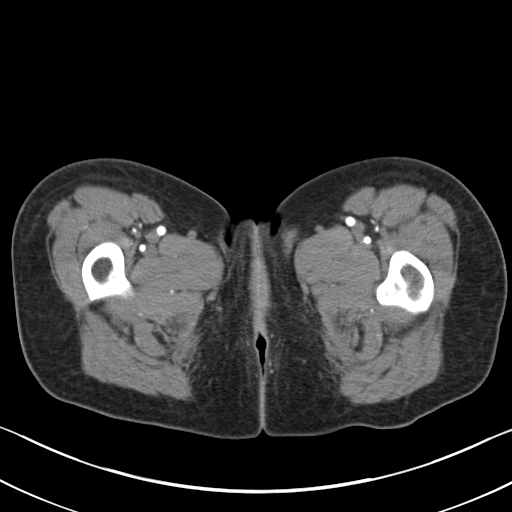
[im 6/87  bone]
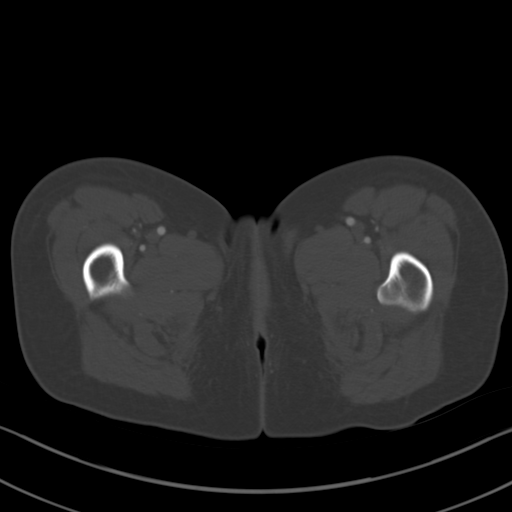
[im 16/87  soft-tissue]
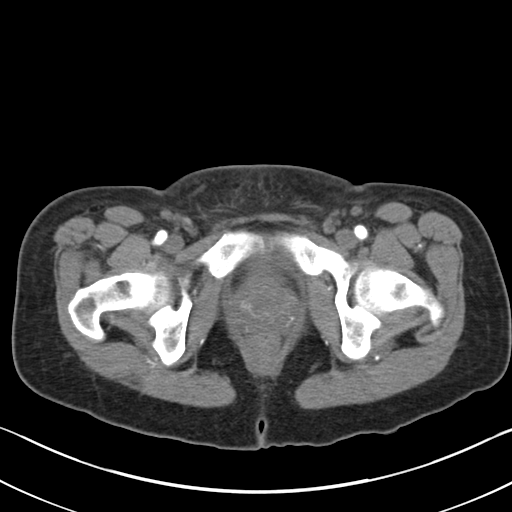
[im 21/87  soft-tissue]
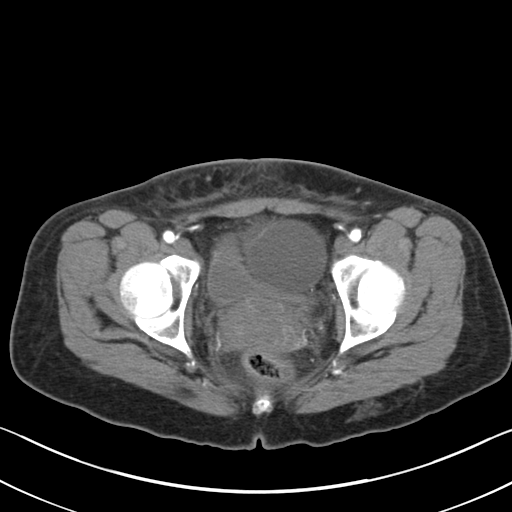
[im 26/87  soft-tissue]
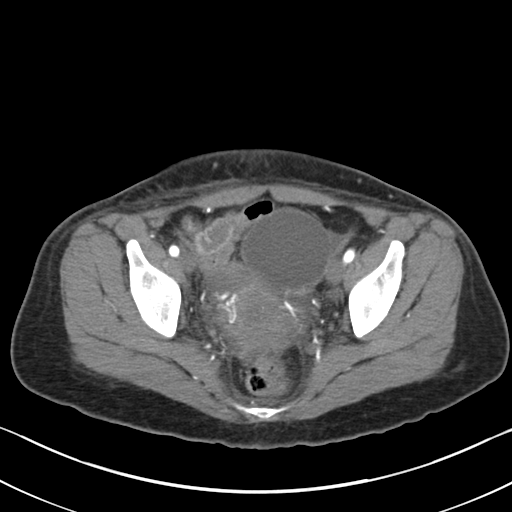
[im 36/87  soft-tissue]
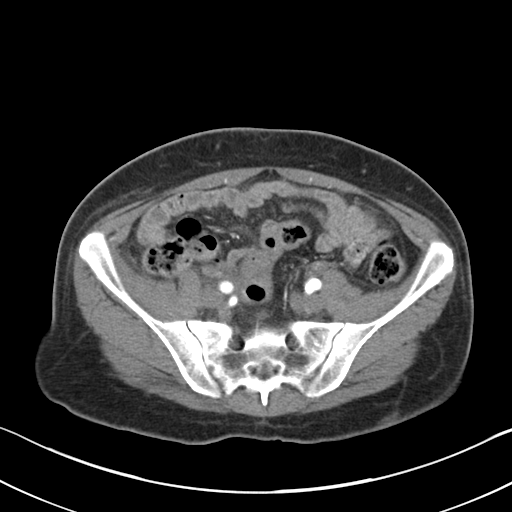
[im 41/87  soft-tissue]
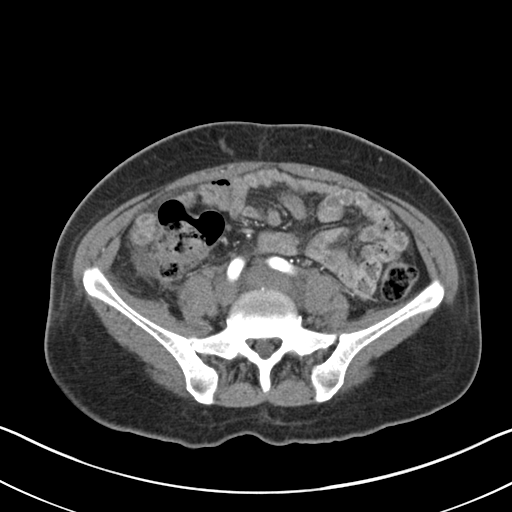
[im 46/87  soft-tissue]
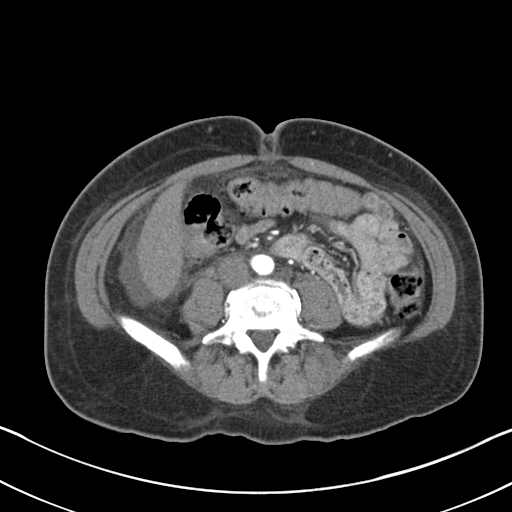
[im 56/87  soft-tissue]
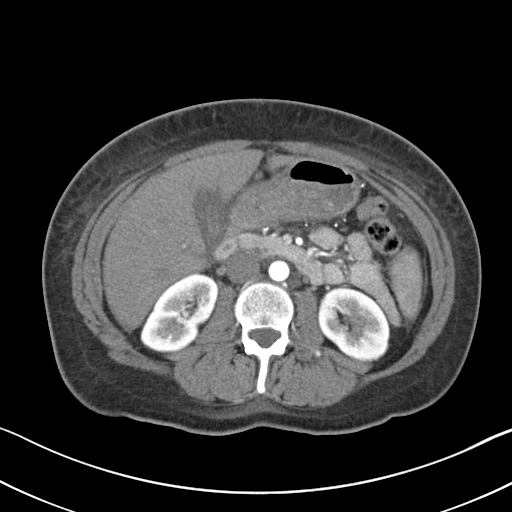
[im 61/87  soft-tissue]
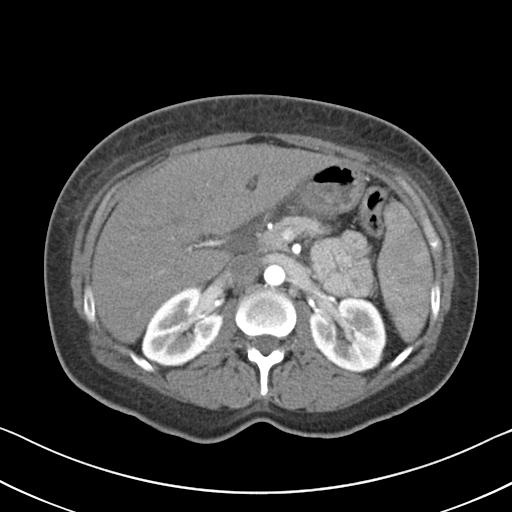
[im 61/87  bone]
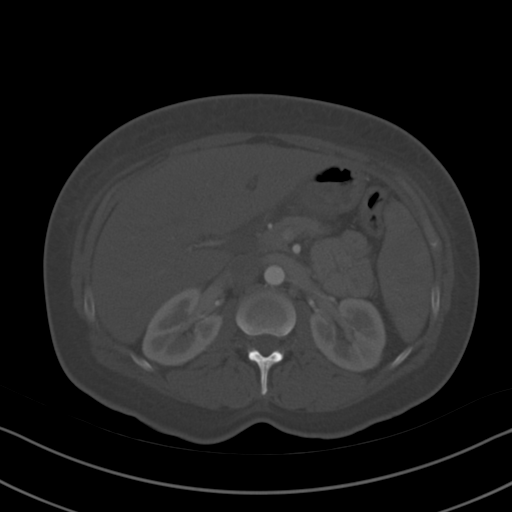
[im 66/87  soft-tissue]
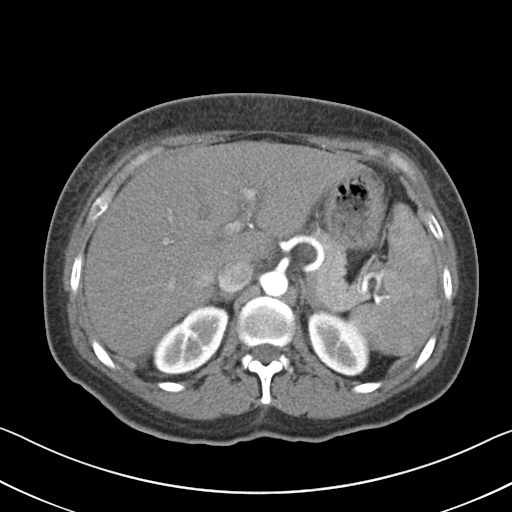
[im 76/87  soft-tissue]
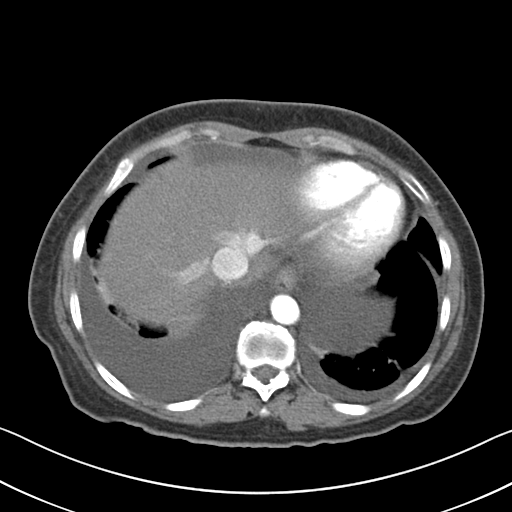
[im 81/87  soft-tissue]
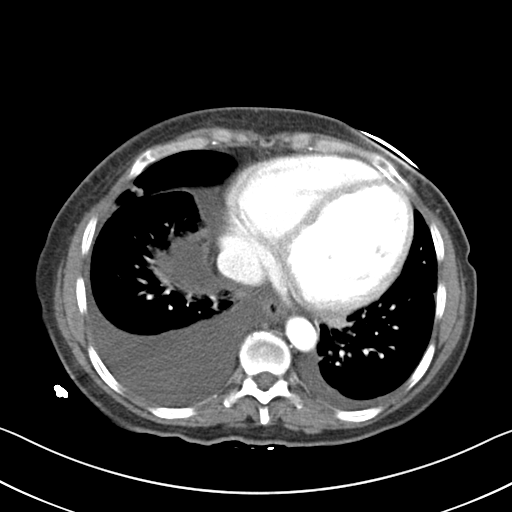

[Series 3: coronal a/|p · coronal · 0.65mm/px · 3 of 129 slices shown]
[im 43/129  soft-tissue]
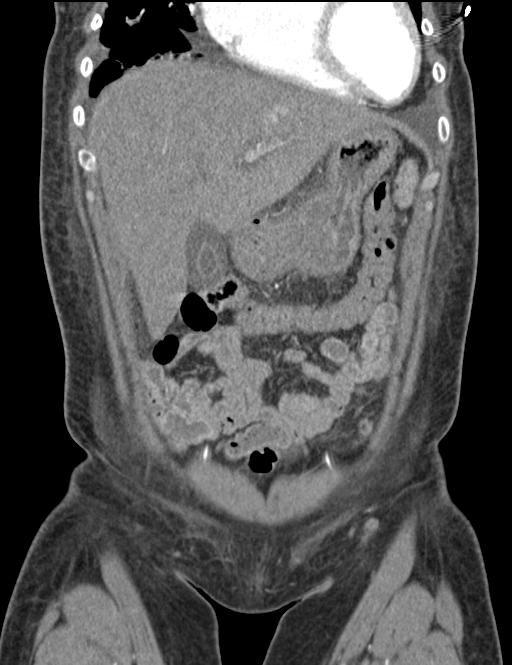
[im 57/129  soft-tissue]
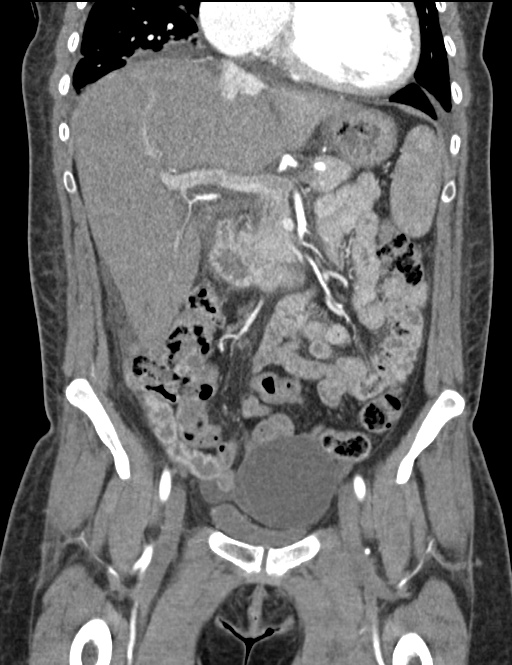
[im 72/129  soft-tissue]
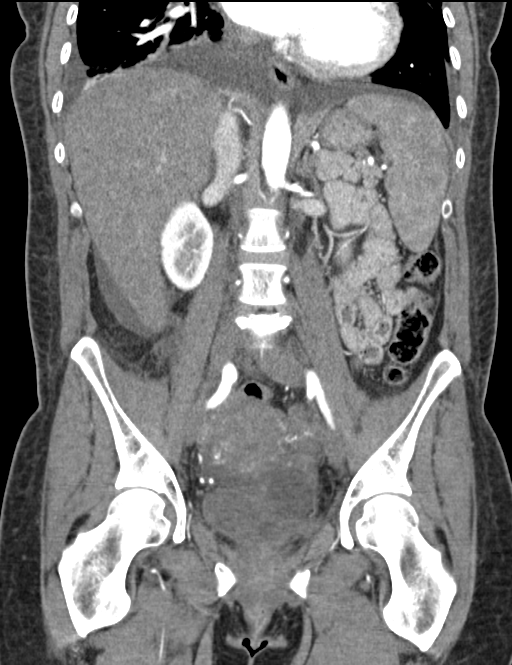

[15 of 46 positions shown; findings below may reference images not displayed]

FINDINGS: Lower chest: Small to moderate right and trace left pleural
effusions are seen, with patchy bibasilar airspace opacification,
raising question for underlying infection. The heart is enlarged,
with reflux of contrast into the hepatic veins and IVC, raising
concern for some degree of right heart insufficiency.

Hepatobiliary: The liver is unremarkable in appearance. Prominent
gallbladder wall thickening is noted, of uncertain significance. The
common bile duct remains borderline normal in caliber.

Pancreas: The pancreas is within normal limits.

Spleen: The spleen is unremarkable in appearance.

Adrenals/Urinary Tract: The adrenal glands are unremarkable in
appearance. The kidneys are within normal limits. There is no
evidence of hydronephrosis. No renal or ureteral stones are
identified. No perinephric stranding is seen.

Stomach/Bowel: The stomach is unremarkable in appearance. The small
bowel is within normal limits. The appendix is normal in caliber,
without evidence of appendicitis. The colon is unremarkable in
appearance.

Vascular/Lymphatic: The abdominal aorta is unremarkable in
appearance. The inferior vena cava is grossly unremarkable. No
retroperitoneal lymphadenopathy is seen. No pelvic sidewall
lymphadenopathy is identified.

Reproductive: The bladder is relatively decompressed and grossly
unremarkable. The uterus is unremarkable in appearance. A large 7 cm
left adnexal cystic structure is again noted, similar in appearance
to [place].

Other:  A small amount of ascites is seen tracking about the liver.

Musculoskeletal: No acute osseous abnormalities are identified.
There is chronic compression deformity involving the superior
endplates of T12 and L5. The visualized musculature is unremarkable
in appearance.
IMPRESSION: 1. Small to moderate right and trace left pleural effusions, with
patchy bibasilar airspace opacification, raising question for
underlying infection.
2. Cardiomegaly, new from the prior study, with reflux of contrast
into the hepatic veins and IVC, raising concern for some degree of
right heart insufficiency. This corresponds to the patient's known
acute systolic congestive heart failure.
3. Prominent gallbladder wall thickening, of uncertain significance.
This may reflect underlying volume overload.
4. Large 7 cm left adnexal cystic structure again noted, essentially
unchanged from [place]. Annual follow-up was recommended on prior
ultrasound.
5. Small amount of ascites noted tracking about the liver.
6. Chronic compression deformity involving the superior plates of
T12 and L5.

## 2017-11-05 ENCOUNTER — Ambulatory Visit (HOSPITAL_COMMUNITY)
Admission: RE | Admit: 2017-11-05 | Discharge: 2017-11-05 | Disposition: A | Discharge status: Home / Self Care | Payer: 59 | Source: Ambulatory Visit | Attending: Internal Medicine | Admitting: Internal Medicine

## 2017-11-05 ENCOUNTER — Ambulatory Visit (INDEPENDENT_AMBULATORY_CARE_PROVIDER_SITE_OTHER): Payer: 59 | Admitting: *Deleted

## 2017-11-05 ENCOUNTER — Encounter (HOSPITAL_COMMUNITY): Payer: Self-pay

## 2017-11-05 VITALS — BP 130/82 | HR 100 | Wt 134.8 lb

## 2017-11-05 DIAGNOSIS — I428 Other cardiomyopathies: Secondary | ICD-10-CM

## 2017-11-05 DIAGNOSIS — I081 Rheumatic disorders of both mitral and tricuspid valves: Secondary | ICD-10-CM | POA: Diagnosis not present

## 2017-11-05 DIAGNOSIS — I5022 Chronic systolic (congestive) heart failure: Secondary | ICD-10-CM

## 2017-11-05 DIAGNOSIS — Z79899 Other long term (current) drug therapy: Secondary | ICD-10-CM | POA: Diagnosis not present

## 2017-11-05 DIAGNOSIS — Z7982 Long term (current) use of aspirin: Secondary | ICD-10-CM | POA: Diagnosis not present

## 2017-11-05 DIAGNOSIS — R748 Abnormal levels of other serum enzymes: Secondary | ICD-10-CM

## 2017-11-05 DIAGNOSIS — I429 Cardiomyopathy, unspecified: Secondary | ICD-10-CM | POA: Insufficient documentation

## 2017-11-05 DIAGNOSIS — I340 Nonrheumatic mitral (valve) insufficiency: Secondary | ICD-10-CM

## 2017-11-05 LAB — BASIC METABOLIC PANEL
Anion gap: 11 (ref 5–15)
BUN: 10 mg/dL (ref 6–20)
CO2: 28 mmol/L (ref 22–32)
Calcium: 8.9 mg/dL (ref 8.9–10.3)
Chloride: 102 mmol/L (ref 98–111)
Creatinine, Ser: 0.58 mg/dL (ref 0.44–1.00)
GFR calc Af Amer: >60 mL/min (ref >60)
GFR calc non Af Amer: >60 mL/min (ref >60)
Glucose, Bld: 92 mg/dL (ref 70–99)
Potassium: 3.5 mmol/L (ref 3.5–5.1)
Sodium: 141 mmol/L (ref 135–145)

## 2017-11-05 LAB — GAMMA GT: GGT: 16 U/L (ref 7–50)

## 2017-11-05 NOTE — Progress Notes (Signed)
PCP: Dr. Janne Napoleon Primary Cardiologist: Dr. Stanford Breed  HF MD: Dr Aundra Dubin     Advanced Heart Failure Clinic Note   HPI:  Kathy Rosales is a 42 y.o. female with a past medical history of NICM EF 25-30% (cath in 05/2016 with normal cors), moderate mitral regurgitation, and tricuspid regurgitation. No FH of coronary disease.   Diagnosed with CHF and  went to her PCP in January 2018 for dyspnea. Says she started getting SOB in the fall. Denies any type of viral illness. No family history of heart disease. Echo was performed and showed reduced EF 20-25% with mod-severe MR. at 25-30%. She was referred to Dr. Stanford Breed who then set her up for a left and right heart cath. Admitted over night on 2/19 after cath due to changes in LOC after cath, was hypotensive. Symptoms resolved, head CT negative. Optimization of her medications was prohibited by hypotension prior to discharge. Started on corlanor.   Had CPX 07/15/16. Results as below. Immediately after, pt sat in chair and then had a pseudoseizure. No tonic/clonic activity. Pt taken to ED with Neurology consulted. Pt recovered spontaneously recovered. CT angio chest and CT head unremarkable.  Repeat echo was done in 4/18.  EF remained 20-25% with moderate diastolic dysfunction.   She is s/p [employer] ICD implantation 09/27/16. Admission complicated by AMS thought to be psychogenic. CT head, CTA head/neck and EEG were all negative.   She presents today for regular follow up. Overall she is doing great. Running 5+ days a week weather permitting. She denies SOB/PND or Orthopnea. Appetite OK. No fever or chills. Weight at home ~130 lbs. Taking all medications as directed, but taking Losartan AND Entresto. It is unclear through what time frame. She denies lightheadedness or dizziness. No fever or chills. Appetite has been stable.   Labs (6/18): K 3.5, creatinine 0.71, hgb 13.1 Labs (1/19): digoxin < 0.2 Labs (3/19): K 3.1,  creatinine 0.63  CPX 07/18/16 Pre-Exercise PFTs  FVC 2.79 (92%)    FEV1 2.38 (93%)     FEV1/FVC 85 (101%)     MVV 48 (49%) Exercise Time:  3:15  Speed (mph): 1.5   Grade (%): 0   RPE: 17 Reason stopped: Patient ended test due to Dyspnea (8/10) Additional symptoms: lightheaded (9/10), chest pressure (9/10) Resting HR: 84 Peak HR: 95  (53% age predicted max HR) BP rest: 92/62 BP peak: 88/60 Peak VO2: 14.1 (44% predicted peak VO2) VE/VCO2 slope: 59.4 OUES: 0.73 Peak RER: 0.96 Ventilatory Threshold: Not achieved  Peak RR 77 Peak Ventilation: 28.8 VE/MVV: 60% PETCO2 at peak: 28 O2pulse: 8  80% predicted O2pulse)  RHC/LHC 2/19/018  RA 3 PCWP  20 CO/CI 4.43/2.88 LHC normal cors.   ECHO 04/2016 EF 20-25%. Mod-Severe MR, Mod TR.  ECHO 4/18 EF 202-5%, moderate diastolic dysfunction, PASP 31 mmHg.   SH: Lives with Mom. No alcohol or drug abuse. Does not smoke cigarettes  FH: No history of CAD.    Review of systems complete and found to be negative unless listed in HPI.    SH:  Social History   Socioeconomic History  . Marital status: Married    Spouse name: Not on file  . Number of children: 1  . Years of education: Not on file  . Highest education level: Not on file  Occupational History  . Occupation: unemployeed  Social Needs  . Financial resource strain: Not on file  . Food insecurity:    Worry: Not on file  Inability: Not on file  . Transportation needs:    Medical: Not on file    Non-medical: Not on file  Tobacco Use  . Smoking status: Never Smoker  . Smokeless tobacco: Never Used  Substance and Sexual Activity  . Alcohol use: No  . Drug use: No  . Sexual activity: Never    Birth control/protection: None  Lifestyle  . Physical activity:    Days per week: Not on file    Minutes per session: Not on file  . Stress: Not on file  Relationships  . Social connections:    Talks on phone: Not on file    Gets together: Not on file     Attends religious service: Not on file    Active member of club or organization: Not on file    Attends meetings of clubs or organizations: Not on file    Relationship status: Not on file  . Intimate partner violence:    Fear of current or ex partner: Not on file    Emotionally abused: Not on file    Physically abused: Not on file    Forced sexual activity: Not on file  Other Topics Concern  . Not on file  Social History Narrative   ** Merged History Encounter **       ** Merged History Encounter **        FH:  Family History  Problem Relation Age of Onset  . Cancer Paternal Grandfather   . Mental illness Neg Hx     Past Medical History:  Diagnosis Date  . Acute systolic CHF (congestive heart failure) ([place]) 05/22/2016  . Blood transfusion without reported diagnosis   . 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 carvedilol (COREG) 6.25 MG tablet Take 1.5 tablets (9.375 mg total) by mouth 2 (two) times daily with a meal. 90 tablet 11  . digoxin (LANOXIN) 0.125 MG tablet TAKE 1 TABLET BY MOUTH DAILY. 90 tablet 2  . furosemide (LASIX) 20 MG tablet Take 2 tablets (40 mg total) by mouth daily. 180 tablet 3  . PARoxetine (PAXIL) 20 MG tablet Take 20 mg by mouth daily.    . Potassium Chloride ER 20 MEQ TBCR Take 20 mEq by mouth daily. 90 tablet 3  . riboflavin (VITAMIN B-2) 100 MG TABS tablet Take 200 mg by mouth daily.    . sacubitril-valsartan (ENTRESTO) 49-51 MG Take 1 tablet by mouth 2 (two) times daily. 60 tablet 11  . spironolactone (ALDACTONE) 25 MG tablet Take 1 tablet (25 mg total) by mouth daily. 30 tablet 6  . topiramate (TOPAMAX) 25 MG tablet TAKE 1 TABLET BY MOUTH AT BEDTIME. 30 tablet 0  . traZODone (DESYREL) 100 MG tablet Take 200 mg by mouth at bedtime as needed for sleep.     Marland Kitchen artificial tears (LACRILUBE) OINT ophthalmic ointment Place into both eyes every 4 (four) hours as needed for dry eyes. (Patient not taking:  Reported on 09/17/2017) 5 g 0  . aspirin EC 81 MG tablet Take 1 tablet (81 mg total) by mouth daily. 90 tablet 3  . Aspirin-Salicylamide-Caffeine (BC HEADACHE PO) Take 1 packet by mouth 4 (four) times daily as needed (headaches).    . methocarbamol (ROBAXIN) 500 MG tablet Take 1 tablet (500 mg total) by mouth 2 (two) times daily as needed for muscle spasms. 20 tablet 0   No current facility-administered medications for this encounter.    Vitals:  11/05/17 1521  BP: 130/82  Pulse: 100  SpO2: 100%  Weight: 134 lb 12.8 oz (61.1 kg)     Wt Readings from Last 3 Encounters:  11/05/17 134 lb 12.8 oz (61.1 kg)  10/19/17 136 lb 9.6 oz (62 kg)  09/18/17 138 lb 9.6 oz (62.9 kg)   Physical Exam: General: Well appearing. No resp difficulty. HEENT: Normal Neck: Supple. JVP 5-6. Carotids 2+ bilat; no bruits. No thyromegaly or nodule noted. Cor: PMI nondisplaced. RRR, No M/G/R noted Lungs: CTAB, normal effort. Abdomen: Soft, non-tender, non-distended, no HSM. No bruits or masses. +BS  Extremities: No cyanosis, clubbing, or rash. R and LLE no edema.  Neuro: Alert & orientedx3, cranial nerves grossly intact. moves all 4 extremities w/o difficulty. Affect pleasant   ASSESSMENT & PLAN: 1. Chronic Systolic Heart Failure:  - Nonischemic cardiomyopathy, ?viral cardiomyopathy. LHC 05/2016 no coronary disease.  - ECHO 05/2017 EF 35-40%, mild MR - [employer] subcutaneous ICD.   - NYHA I symptoms.  - Volume status stable on exam.   - Continue Lasix 40 mg daily with KCl 20.  - Continue coreg 9.375 mg twice a day. - Continue Entresto 49/51 mg BID. Stop Losartan.  - Continue spiro 25 mg daily.  - Continue digoxin 0.125 mg daily. Check level today.  - Reinforced fluid restriction to < 2 L daily, sodium restriction to less than 2000 mg daily, and the importance of daily weights.   2. Mitral regurgitation: -  Only mild on Echo 05/2017. 3. Elevated Alk Phos - AST/ALT normal. Per PCP.  - Will check  GGT per PCP notes and forward.   Shirley Friar, PA-C  11/05/2017   Greater than 50% of the 30 minute visit was spent in counseling/coordination of care regarding disease state education, salt/fluid restriction, sliding scale diuretics, and medication compliance.

## 2017-11-05 NOTE — Patient Instructions (Signed)
Routine lab work today. Will notify you of abnormal results, otherwise no news is good news!  STOP Losartan.  Follow up with Dr. Aundra Dubin in 3 months.  ________________________________________________________________________________ Kathy Rosales Code: [id number]  Take all medication as prescribed the day of your appointment. Bring all medications with you to your appointment.  Do the following things EVERYDAY: 1) Weigh yourself in the morning before breakfast. Write it down and keep it in a log. 2) Take your medicines as prescribed 3) Eat low salt foods-Limit salt (sodium) to 2000 mg per day.  4) Stay as active as you can everyday 5) Limit all fluids for the day to less than 2 liters

## 2017-11-06 NOTE — Progress Notes (Signed)
Remote ICD transmission.   

## 2017-11-12 ENCOUNTER — Encounter

## 2017-11-19 ENCOUNTER — Encounter: Payer: Self-pay | Admitting: Internal Medicine

## 2017-11-19 ENCOUNTER — Ambulatory Visit: Payer: 59 | Attending: Internal Medicine | Admitting: Internal Medicine

## 2017-11-19 VITALS — BP 118/77 | HR 98 | Temp 98.3°F | Resp 16 | Wt 137.4 lb

## 2017-11-19 DIAGNOSIS — R945 Abnormal results of liver function studies: Secondary | ICD-10-CM | POA: Diagnosis present

## 2017-11-19 DIAGNOSIS — R4182 Altered mental status, unspecified: Secondary | ICD-10-CM | POA: Diagnosis not present

## 2017-11-19 DIAGNOSIS — I429 Cardiomyopathy, unspecified: Secondary | ICD-10-CM | POA: Diagnosis not present

## 2017-11-19 DIAGNOSIS — Z888 Allergy status to other drugs, medicaments and biological substances status: Secondary | ICD-10-CM | POA: Diagnosis not present

## 2017-11-19 DIAGNOSIS — I428 Other cardiomyopathies: Secondary | ICD-10-CM

## 2017-11-19 DIAGNOSIS — Z8719 Personal history of other diseases of the digestive system: Secondary | ICD-10-CM | POA: Insufficient documentation

## 2017-11-19 DIAGNOSIS — M0609 Rheumatoid arthritis without rheumatoid factor, multiple sites: Secondary | ICD-10-CM

## 2017-11-19 DIAGNOSIS — Z7982 Long term (current) use of aspirin: Secondary | ICD-10-CM | POA: Insufficient documentation

## 2017-11-19 DIAGNOSIS — Z79899 Other long term (current) drug therapy: Secondary | ICD-10-CM | POA: Diagnosis not present

## 2017-11-19 DIAGNOSIS — Z2821 Immunization not carried out because of patient refusal: Secondary | ICD-10-CM | POA: Diagnosis not present

## 2017-11-19 DIAGNOSIS — M069 Rheumatoid arthritis, unspecified: Secondary | ICD-10-CM | POA: Diagnosis not present

## 2017-11-19 DIAGNOSIS — Z809 Family history of malignant neoplasm, unspecified: Secondary | ICD-10-CM | POA: Insufficient documentation

## 2017-11-19 DIAGNOSIS — I340 Nonrheumatic mitral (valve) insufficiency: Secondary | ICD-10-CM | POA: Diagnosis not present

## 2017-11-19 DIAGNOSIS — R7989 Other specified abnormal findings of blood chemistry: Secondary | ICD-10-CM

## 2017-11-19 DIAGNOSIS — Z9889 Other specified postprocedural states: Secondary | ICD-10-CM | POA: Insufficient documentation

## 2017-11-19 DIAGNOSIS — I5022 Chronic systolic (congestive) heart failure: Secondary | ICD-10-CM | POA: Diagnosis not present

## 2017-11-19 MED ORDER — PREDNISONE 10 MG PO TABS: 5.0000 mg | ORAL_TABLET | Freq: Every day | ORAL | 0 refills | Status: DC

## 2017-11-19 NOTE — Progress Notes (Signed)
Pt is wanting to know if pcp will prescribe prednisone

## 2017-11-19 NOTE — Progress Notes (Signed)
Patient ID: Kathy Rosales, female    DOB: [birth year]  MRN: [id number]  CC: Follow-up   Subjective: Kathy Rosales is a 42 y.o. 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  Followed by [facility]).    On last visit we had rechecked patient's liver function tests.  Compared to results received from her rheumatologist, AST, ALT and alkaline phosphatase had decreased significantly.  The alkaline phosphatase was still slightly elevated.  I had my CMA fax the results to her rheumatologist but patient states she has not heard back from them as yet. -Complains of some soreness in her wrists knees and elbows.  She is requesting a refill on prednisone until she sees the rheumatologist.  She is on 5 mg which she takes as needed but is currently out.  NICM/CHF/mild mitral regurg: Sore cardiology PA recently.  Overall she feels she is doing okay.  Denies any chest pains/shortness of breath/lower extremity edema.   Patient Active Problem List   Diagnosis Date Noted  . Left-sided weakness   . Unresponsive   . Chronic systolic heart failure ([place]) 05/22/2016  . Mitral regurgitation 05/22/2016  . Near syncope 05/22/2016  . Delirium due to multiple etiologies, persistent, mixed level of activity 12/13/2015  . MDD (major depressive disorder), single episode, moderate ([place]) 12/13/2015  . Fatigue 12/01/2015  . Helicobacter positive gastritis 08/06/2015  . Gastric ulcer 08/06/2015  . Iron deficiency anemia due to chronic blood loss 08/06/2015  . Anemia 07/21/2015  . Iron deficiency anemia 10/22/2014  . B12 deficiency 10/22/2014  . Symptomatic anemia 08/28/2014  . Headache 08/28/2014  . 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  artificial tears (LACRILUBE) OINT ophthalmic ointment Place into both eyes every 4 (four) hours as needed for dry eyes. (Patient not taking: Reported on 09/17/2017) 5 g 0  . aspirin EC 81 MG tablet Take 1 tablet (81 mg total) by mouth daily. 90 tablet 3  . Aspirin-Salicylamide-Caffeine (BC HEADACHE PO) Take 1 packet by mouth 4 (four) times daily as needed (headaches).    . carvedilol (COREG) 6.25 MG tablet Take 1.5 tablets (9.375 mg total) by mouth 2 (two) times daily with a meal. 90 tablet 11  . digoxin (LANOXIN) 0.125 MG tablet TAKE 1 TABLET BY MOUTH DAILY. 90 tablet 2  . furosemide (LASIX) 20 MG tablet Take 2 tablets (40 mg total) by mouth daily. 180 tablet 3  . methocarbamol (ROBAXIN) 500 MG tablet Take 1 tablet (500 mg total) by mouth 2 (two) times daily as needed for muscle spasms. 20 tablet 0  . PARoxetine (PAXIL) 20 MG tablet Take 20 mg by mouth daily.    . Potassium Chloride ER 20 MEQ TBCR Take 20 mEq by mouth daily. 90 tablet 3  . riboflavin (VITAMIN B-2) 100 MG TABS tablet Take 200 mg by mouth daily.    . sacubitril-valsartan (ENTRESTO) 49-51 MG Take 1 tablet by mouth 2 (two) times daily. 60 tablet 11  . spironolactone (ALDACTONE) 25 MG tablet Take 1 tablet (25 mg total) by mouth daily. 30 tablet 6  . topiramate (TOPAMAX) 25 MG tablet TAKE 1 TABLET BY MOUTH AT BEDTIME. 30 tablet 0  . traZODone (DESYREL) 100 MG tablet Take 200 mg by mouth at bedtime as  needed for sleep.      No current facility-administered medications on file prior to visit.     Allergies  Allergen Reactions  . Tramadol Itching and Other (See Comments)    Red spots all over body and itching  . Furosemide Rash  . Leflunomide Rash    Social History   Socioeconomic History  . Marital status: Married    Spouse name: Not on file  . Number of children: 1  . Years of education: Not on file  . Highest education level: Not on file  Occupational History  . Occupation: unemployeed  Social Needs  . Financial  resource strain: Not on file  . Food insecurity:    Worry: Not on file    Inability: Not on file  . Transportation needs:    Medical: Not on file    Non-medical: Not on file  Tobacco Use  . Smoking status: Never Smoker  . Smokeless tobacco: Never Used  Substance and Sexual Activity  . Alcohol use: No  . Drug use: No  . Sexual activity: Never    Birth control/protection: None  Lifestyle  . Physical activity:    Days per week: Not on file    Minutes per session: Not on file  . Stress: Not on file  Relationships  . Social connections:    Talks on phone: Not on file    Gets together: Not on file    Attends religious service: Not on file    Active member of club or organization: Not on file    Attends meetings of clubs or organizations: Not on file    Relationship status: Not on file  . Intimate partner violence:    Fear of current or ex partner: Not on file    Emotionally abused: Not on file    Physically abused: Not on file    Forced sexual activity: Not on file  Other Topics Concern  . Not on file  Social History Narrative   ** Merged History Encounter **       ** Merged History Encounter **        Family History  Problem Relation Age of Onset  . Cancer Paternal Grandfather   . Mental illness Neg Hx     Past Surgical History:  Procedure Laterality Date  . COLONOSCOPY WITH PROPOFOL N/A 07/23/2015   Procedure: COLONOSCOPY WITH PROPOFOL;  Surgeon: Ronald Lobo, MD;  Location: [facility];  Service: Endoscopy;  Laterality: N/A;  . ESOPHAGOGASTRODUODENOSCOPY (EGD) WITH PROPOFOL N/A 07/23/2015   Procedure: ESOPHAGOGASTRODUODENOSCOPY (EGD) WITH PROPOFOL;  Surgeon: Ronald Lobo, MD;  Location: [facility];  Service: Endoscopy;  Laterality: N/A;  . ESOPHAGOGASTRODUODENOSCOPY (EGD) WITH PROPOFOL N/A 11/18/2015   Procedure: ESOPHAGOGASTRODUODENOSCOPY (EGD) WITH PROPOFOL;  Surgeon: Ronald Lobo, MD;  Location: [facility];  Service: Endoscopy;  Laterality: N/A;  .  RIGHT/LEFT HEART CATH AND CORONARY ANGIOGRAPHY N/A 05/22/2016   Procedure: Right/Left Heart Cath and Coronary Angiography;  Surgeon: Peter M Martinique, MD;  Location: [place] [facility];  Service: Cardiovascular;  Laterality: N/A;  . SUBQ ICD IMPLANT N/A 09/27/2016   Procedure: SubQ ICD Implant;  Surgeon: Evans Lance, MD;  Location: [place] [facility];  Service: Cardiovascular;  Laterality: N/A;    ROS: Review of Systems Negative except as stated above PHYSICAL EXAM: BP 118/77   Pulse 98   Temp 98.3 F (36.8 C) (Oral)   Resp 16   Wt 137 lb 6.4 oz (62.3 kg)   SpO2 98%  BMI 22.86 kg/m   Physical Exam  General appearance - alert, well appearing, and in no distress Chest - clear to auscultation, no wheezes, rales or rhonchi, symmetric air entry Heart - normal rate, regular rhythm, normal S1, S2, no murmurs, rubs, clicks or gallops Musculoskeletal -no joint enlargement or deformities noted at the wrists, fingers or elbows.  Mild enlargement of the knee joints.  Good range of motion at the knees. Extremities - peripheral pulses normal, no pedal edema, no clubbing or cyanosis  Results for orders placed or performed during the hospital encounter of 11/05/17  Gamma GT  Result Value Ref Range   GGT 16 7 - 50 U/L  Basic metabolic panel  Result Value Ref Range   Sodium 141 135 - 145 mmol/L   Potassium 3.5 3.5 - 5.1 mmol/L   Chloride 102 98 - 111 mmol/L   CO2 28 22 - 32 mmol/L   Glucose, Bld 92 70 - 99 mg/dL   BUN 10 6 - 20 mg/dL   Creatinine, Ser 0.58 0.44 - 1.00 mg/dL   Calcium 8.9 8.9 - 10.3 mg/dL   GFR calc non Af Amer >60 >60 mL/min   GFR calc Af Amer >60 >60 mL/min   Anion gap 11 5 - 15    ASSESSMENT AND PLAN: 1. Abnormal LFTs AST and ALT have returned to normal.  Alk phos has decreased compared to what it was when checked by her rheumatologist.  If this elevation persists or gets worse we will need to do further work-up to check for PBC or PSC  2. Rheumatoid arthritis of  multiple sites with negative rheumatoid factor (HCC) Limited supply of prednisone given.  She will follow-up with her rheumatologist. - predniSONE (DELTASONE) 10 MG tablet; Take 0.5 tablets (5 mg total) by mouth daily with breakfast.  Dispense: 15 tablet; Refill: 0  3. NICM (nonischemic cardiomyopathy) ([place]) Patient clinically stable on her current medications.  4.  Patient declines flu shot Patient was given the opportunity to ask questions.  Patient verbalized understanding of the plan and was able to repeat key elements of the plan.   No orders of the defined types were placed in this encounter.    Requested Prescriptions   Signed Prescriptions Disp Refills  . predniSONE (DELTASONE) 10 MG tablet 15 tablet 0    Sig: Take 0.5 tablets (5 mg total) by mouth daily with breakfast.    Return in about 4 months (around 03/21/2018).  Karle Plumber, MD, FACP

## 2017-11-28 LAB — CUP PACEART REMOTE DEVICE CHECK
Battery Remaining Percentage: 88 %
Date Time Interrogation Session: 20190805113000
Implantable Lead Implant Date: 20180627
Implantable Lead Location: 753862
Implantable Lead Model: 3401
Implantable Lead Serial Number: 113145
Implantable Pulse Generator Implant Date: 20180627
Pulse Gen Serial Number: 222625

## 2017-12-18 ENCOUNTER — Other Ambulatory Visit (HOSPITAL_COMMUNITY): Payer: Self-pay | Admitting: Adult Health

## 2017-12-19 ENCOUNTER — Other Ambulatory Visit: Payer: Self-pay | Admitting: Internal Medicine

## 2017-12-19 DIAGNOSIS — G4489 Other headache syndrome: Secondary | ICD-10-CM

## 2018-02-01 ENCOUNTER — Encounter (HOSPITAL_COMMUNITY): Payer: Self-pay | Admitting: Cardiology

## 2018-02-04 ENCOUNTER — Ambulatory Visit (INDEPENDENT_AMBULATORY_CARE_PROVIDER_SITE_OTHER): Payer: 59 | Admitting: *Deleted

## 2018-02-04 ENCOUNTER — Telehealth: Payer: Self-pay

## 2018-02-04 ENCOUNTER — Inpatient Hospital Stay (HOSPITAL_COMMUNITY): Admission: RE | Admit: 2018-02-04 | Payer: Self-pay | Source: Ambulatory Visit | Admitting: Cardiology

## 2018-02-04 DIAGNOSIS — I5022 Chronic systolic (congestive) heart failure: Secondary | ICD-10-CM

## 2018-02-04 DIAGNOSIS — I428 Other cardiomyopathies: Secondary | ICD-10-CM

## 2018-02-04 NOTE — Telephone Encounter (Signed)
Attempted to confirm remote transmission with pt. No answer and was unable to leave a message.   

## 2018-02-05 NOTE — Progress Notes (Signed)
Carelink Summary Report / Loop Recorder 

## 2018-02-07 ENCOUNTER — Encounter: Payer: Self-pay | Admitting: Cardiology

## 2018-03-18 ENCOUNTER — Other Ambulatory Visit: Payer: Self-pay | Admitting: Internal Medicine

## 2018-03-18 DIAGNOSIS — Z1231 Encounter for screening mammogram for malignant neoplasm of breast: Secondary | ICD-10-CM

## 2018-03-21 ENCOUNTER — Other Ambulatory Visit: Payer: Self-pay | Admitting: Internal Medicine

## 2018-03-21 DIAGNOSIS — G4489 Other headache syndrome: Secondary | ICD-10-CM

## 2018-04-07 LAB — CUP PACEART REMOTE DEVICE CHECK
Date Time Interrogation Session: 20200105143918
Implantable Lead Implant Date: 20180627
Implantable Lead Location: 753862
Implantable Lead Model: 3401
Implantable Lead Serial Number: 113145
Implantable Pulse Generator Implant Date: 20180627
Pulse Gen Serial Number: 222625

## 2018-04-26 ENCOUNTER — Ambulatory Visit: Payer: Self-pay

## 2018-05-06 ENCOUNTER — Ambulatory Visit (INDEPENDENT_AMBULATORY_CARE_PROVIDER_SITE_OTHER): Payer: 59

## 2018-05-06 DIAGNOSIS — I5022 Chronic systolic (congestive) heart failure: Secondary | ICD-10-CM

## 2018-05-06 DIAGNOSIS — I428 Other cardiomyopathies: Secondary | ICD-10-CM

## 2018-05-09 LAB — CUP PACEART REMOTE DEVICE CHECK
Battery Remaining Percentage: 83 %
Date Time Interrogation Session: 20200203234500
Implantable Lead Implant Date: 20180627
Implantable Lead Location: 753862
Implantable Lead Model: 3401
Implantable Lead Serial Number: 113145
Implantable Pulse Generator Implant Date: 20180627
Pulse Gen Serial Number: 222625

## 2018-05-15 NOTE — Progress Notes (Signed)
Remote ICD transmission.   

## 2018-05-31 ENCOUNTER — Ambulatory Visit (HOSPITAL_COMMUNITY)
Admission: RE | Admit: 2018-05-31 | Discharge: 2018-05-31 | Disposition: A | Discharge status: Home / Self Care | Payer: 59 | Source: Ambulatory Visit | Attending: Cardiology | Admitting: Cardiology

## 2018-05-31 VITALS — BP 116/70 | HR 100 | Wt 131.8 lb

## 2018-05-31 DIAGNOSIS — Z7982 Long term (current) use of aspirin: Secondary | ICD-10-CM | POA: Diagnosis not present

## 2018-05-31 DIAGNOSIS — Z7952 Long term (current) use of systemic steroids: Secondary | ICD-10-CM | POA: Insufficient documentation

## 2018-05-31 DIAGNOSIS — I428 Other cardiomyopathies: Secondary | ICD-10-CM | POA: Insufficient documentation

## 2018-05-31 DIAGNOSIS — Z79899 Other long term (current) drug therapy: Secondary | ICD-10-CM | POA: Diagnosis not present

## 2018-05-31 DIAGNOSIS — I5022 Chronic systolic (congestive) heart failure: Secondary | ICD-10-CM | POA: Insufficient documentation

## 2018-05-31 DIAGNOSIS — I340 Nonrheumatic mitral (valve) insufficiency: Secondary | ICD-10-CM

## 2018-05-31 DIAGNOSIS — I081 Rheumatic disorders of both mitral and tricuspid valves: Secondary | ICD-10-CM | POA: Insufficient documentation

## 2018-05-31 LAB — BASIC METABOLIC PANEL
Anion gap: 7 (ref 5–15)
BUN: 12 mg/dL (ref 6–20)
CO2: 22 mmol/L (ref 22–32)
Calcium: 8.7 mg/dL — ABNORMAL LOW (ref 8.9–10.3)
Chloride: 106 mmol/L (ref 98–111)
Creatinine, Ser: 0.6 mg/dL (ref 0.44–1.00)
GFR calc Af Amer: >60 mL/min (ref >60)
GFR calc non Af Amer: >60 mL/min (ref >60)
Glucose, Bld: 107 mg/dL — ABNORMAL HIGH (ref 70–99)
Potassium: 4.6 mmol/L (ref 3.5–5.1)
Sodium: 135 mmol/L (ref 135–145)

## 2018-05-31 LAB — DIGOXIN LEVEL: Digoxin Level: <0.2 ng/mL — ABNORMAL LOW (ref 0.8–2.0)

## 2018-05-31 MED ORDER — CARVEDILOL 12.5 MG PO TABS: 12.5000 mg | ORAL_TABLET | Freq: Two times a day (BID) | ORAL | 6 refills | Status: DC

## 2018-05-31 NOTE — Progress Notes (Signed)
PCP: Dr. Janne Napoleon Primary Cardiologist: Dr. Stanford Breed  HF MD: Dr Aundra Dubin     Advanced Heart Failure Clinic Note   HPI:  Kathy Rosales is a 43 y.o. female with a past medical history of NICM EF 25-30% (cath in 05/2016 with normal cors), moderate mitral regurgitation, and tricuspid regurgitation. No FH of coronary disease.   Diagnosed with CHF and  went to her PCP in January 2018 for dyspnea. Says she started getting SOB in the fall. Denies any type of viral illness. No family history of heart disease. Echo was performed and showed reduced EF 20-25% with mod-severe MR. at 25-30%. She was referred to Dr. Stanford Breed who then set her up for a left and right heart cath. Admitted over night on 2/19 after cath due to changes in LOC after cath, was hypotensive. Symptoms resolved, head CT negative. Optimization of her medications was prohibited by hypotension prior to discharge. Started on corlanor.   Had CPX 07/15/16. Results as below. Immediately after, pt sat in chair and then had a pseudoseizure. No tonic/clonic activity. Pt taken to ED with Neurology consulted. Pt recovered spontaneously recovered. CT angio chest and CT head unremarkable.  Repeat echo was done in 4/18.  EF remained 20-25% with moderate diastolic dysfunction.   She is s/p [employer] ICD implantation 09/27/16. Admission complicated by AMS thought to be psychogenic. CT head, CTA head/neck and EEG were all negative.  Echo in 2/19 showed EF 35-40%.    She presents today for followup of CHF. She is taking all her medications.  No significant exertional dyspnea, walks 4 miles/day.  Chronically sleeps on 2-3 pillows.  No chest pain.  Feels like she is doing well.   ECG (personally reviewed: NSR, nonspecific T wave flattening  Labs (6/18): K 3.5, creatinine 0.71, hgb 13.1 Labs (1/19): digoxin < 0.2 Labs (3/19): K 3.1, creatinine 0.63  PMH: 1. Chronic systolic CHF: Nonischemic cardiomyopathy.  - LHC/RHC (2/18)  with no significant coronary disease. Mean RA 3, mean PCWP 20, CI 2.88.  - Echo (1/18) with EF 20-25%, moderate-severe MR.   - CPX (4/18): peak VO2 14.1, VE/VCO2 slope 59, RER 0.96 (submaximal).  - Echo (4/18): EF 20-25%.  - [place] subcutaneous ICD - Echo (2/19): EF 35-40%, mild MR.  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  3. H/o Still's disease.   SH: Lives with Mom. No alcohol or drug abuse. Does not smoke cigarettes  FH: No history of CAD or cardiomyopathy.     Review of systems complete and found to be negative unless listed in HPI.     Current Outpatient Medications  Medication Sig Dispense Refill  . artificial tears (LACRILUBE) OINT ophthalmic ointment Place into both eyes every 4 (four) hours as needed for dry eyes. 5 g 0  . aspirin EC 81 MG tablet Take 1 tablet (81 mg total) by mouth daily. 90 tablet 3  . Aspirin-Salicylamide-Caffeine (BC HEADACHE PO) Take 1 packet by mouth 4 (four) times daily as needed (headaches).    . carvedilol (COREG) 12.5 MG tablet Take 1 tablet (12.5 mg total) by mouth 2 (two) times daily with a meal. 30 tablet 6  . digoxin (LANOXIN) 0.125 MG tablet TAKE 1 TABLET BY MOUTH DAILY. 90 tablet 2  . furosemide (LASIX) 20 MG tablet Take 2 tablets (40 mg total) by mouth daily. 180 tablet 3  . methocarbamol (ROBAXIN) 500 MG tablet Take 1 tablet (500 mg total) by mouth 2 (two) times daily as needed for muscle spasms.  20 tablet 0  . PARoxetine (PAXIL) 20 MG tablet Take 20 mg by mouth daily.    . Potassium Chloride ER 20 MEQ TBCR Take 20 mEq by mouth daily. 90 tablet 3  . predniSONE (DELTASONE) 10 MG tablet Take 0.5 tablets (5 mg total) by mouth daily with breakfast. 15 tablet 0  . riboflavin (VITAMIN B-2) 100 MG TABS tablet Take 200 mg by mouth daily.    . sacubitril-valsartan (ENTRESTO) 49-51 MG Take 1 tablet by mouth 2 (two) times daily. 60 tablet 11  . spironolactone (ALDACTONE) 25 MG tablet TAKE 1 TABLET BY MOUTH DAILY. 30 tablet 5  . topiramate (TOPAMAX) 25  MG tablet TAKE 1 TABLET BY MOUTH AT BEDTIME. 30 tablet 0  . traZODone (DESYREL) 100 MG tablet Take 200 mg by mouth at bedtime as needed for sleep.      No current facility-administered medications for this encounter.    Vitals:   05/31/18 0855  BP: 116/70  Pulse: 100  SpO2: 98%  Weight: 59.8 kg (131 lb 12.8 oz)     Wt Readings from Last 3 Encounters:  05/31/18 59.8 kg (131 lb 12.8 oz)  11/19/17 62.3 kg (137 lb 6.4 oz)  11/05/17 61.1 kg (134 lb 12.8 oz)   Physical Exam: General: NAD Neck: No JVD, no thyromegaly or thyroid nodule.  Lungs: Clear to auscultation bilaterally with normal respiratory effort. CV: Nondisplaced PMI.  Heart regular S1/S2, no S3/S4, no murmur.  No peripheral edema.  No carotid bruit.  Normal pedal pulses.  Abdomen: Soft, nontender, no hepatosplenomegaly, no distention.  Skin: Intact without lesions or rashes.  Neurologic: Alert and oriented x 3.  Psych: Normal affect. Extremities: No clubbing or cyanosis.  HEENT: Normal.   ASSESSMENT & PLAN: 1. Chronic systolic CHF: Nonischemic cardiomyopathy,  LHC 05/2016 no coronary disease.  ?Viral myocarditis as trigger.  [place] subcutaneous ICD.  Most recent echo in 2/19 with EF 35-40%, mild MR. NYHA class I symptoms.  Not volume overloaded on exam.  - Continue Lasix 40 mg daily. BMET today.  - Increase Coreg to 12.5 mg bid.  - Continue Entresto 49/51 mg BID. - Continue spiro 25 mg daily.  - Continue digoxin 0.125 mg daily. Check level today.  - I will arrange for repeat echo.   2. Mitral regurgitation:  Only mild on Echo 05/2017.  Followup in 3 months.   Loralie Champagne, MD  05/31/2018

## 2018-05-31 NOTE — Patient Instructions (Signed)
INCREASE Coreg to 12.5mg  (1 tab) twice a day  Labs today We will only contact you if something comes back abnormal or we need to make some changes. Otherwise no news is good news!  Your physician has requested that you have an echocardiogram. Echocardiography is a painless test that uses sound waves to create images of your heart. It provides your doctor with information about the size and shape of your heart and how well your heart's chambers and valves are working. This procedure takes approximately one hour. There are no restrictions for this procedure.   They will call you to schedule this appointment.   Your physician recommends that you schedule a follow-up appointment in: 3 months with Dr. Aundra Dubin

## 2018-06-03 ENCOUNTER — Ambulatory Visit (HOSPITAL_COMMUNITY): Payer: 59 | Attending: Internal Medicine

## 2018-06-03 DIAGNOSIS — I5022 Chronic systolic (congestive) heart failure: Secondary | ICD-10-CM

## 2018-06-03 MED ORDER — PERFLUTREN LIPID MICROSPHERE
1.0000 mL | INTRAVENOUS | Status: AC | PRN
  Administered 2018-06-03: 1 mL via INTRAVENOUS

## 2018-08-12 ENCOUNTER — Telehealth: Payer: Self-pay

## 2018-08-12 ENCOUNTER — Encounter: Payer: 59 | Admitting: *Deleted

## 2018-08-12 ENCOUNTER — Other Ambulatory Visit: Payer: Self-pay

## 2018-08-12 NOTE — Telephone Encounter (Signed)
Left message for patient to remind of missed remote transmission.  

## 2018-08-30 ENCOUNTER — Encounter (HOSPITAL_COMMUNITY): Payer: Self-pay | Admitting: Cardiology

## 2018-09-06 ENCOUNTER — Encounter (HOSPITAL_COMMUNITY): Payer: 59

## 2018-09-10 ENCOUNTER — Encounter (HOSPITAL_COMMUNITY): Payer: Self-pay

## 2018-09-10 ENCOUNTER — Other Ambulatory Visit: Payer: Self-pay

## 2018-09-10 ENCOUNTER — Emergency Department (HOSPITAL_COMMUNITY)
Admission: EM | Admit: 2018-09-10 | Discharge: 2018-09-10 | Disposition: A | Discharge status: Home / Self Care | Payer: 59 | Attending: Emergency Medicine | Admitting: Emergency Medicine

## 2018-09-10 ENCOUNTER — Emergency Department (HOSPITAL_COMMUNITY): Payer: 59

## 2018-09-10 DIAGNOSIS — E860 Dehydration: Secondary | ICD-10-CM | POA: Insufficient documentation

## 2018-09-10 DIAGNOSIS — I110 Hypertensive heart disease with heart failure: Secondary | ICD-10-CM | POA: Insufficient documentation

## 2018-09-10 DIAGNOSIS — Z79899 Other long term (current) drug therapy: Secondary | ICD-10-CM | POA: Diagnosis not present

## 2018-09-10 DIAGNOSIS — Z20828 Contact with and (suspected) exposure to other viral communicable diseases: Secondary | ICD-10-CM | POA: Insufficient documentation

## 2018-09-10 DIAGNOSIS — I5022 Chronic systolic (congestive) heart failure: Secondary | ICD-10-CM | POA: Insufficient documentation

## 2018-09-10 DIAGNOSIS — B349 Viral infection, unspecified: Secondary | ICD-10-CM | POA: Insufficient documentation

## 2018-09-10 DIAGNOSIS — M069 Rheumatoid arthritis, unspecified: Secondary | ICD-10-CM | POA: Insufficient documentation

## 2018-09-10 DIAGNOSIS — Z7982 Long term (current) use of aspirin: Secondary | ICD-10-CM | POA: Diagnosis not present

## 2018-09-10 DIAGNOSIS — R112 Nausea with vomiting, unspecified: Secondary | ICD-10-CM | POA: Diagnosis present

## 2018-09-10 LAB — BASIC METABOLIC PANEL
Anion gap: 9 (ref 5–15)
BUN: 16 mg/dL (ref 6–20)
CO2: 19 mmol/L — ABNORMAL LOW (ref 22–32)
Calcium: 8.6 mg/dL — ABNORMAL LOW (ref 8.9–10.3)
Chloride: 110 mmol/L (ref 98–111)
Creatinine, Ser: 0.7 mg/dL (ref 0.44–1.00)
GFR calc Af Amer: >60 mL/min (ref >60)
GFR calc non Af Amer: >60 mL/min (ref >60)
Glucose, Bld: 94 mg/dL (ref 70–99)
Potassium: 3.3 mmol/L — ABNORMAL LOW (ref 3.5–5.1)
Sodium: 138 mmol/L (ref 135–145)

## 2018-09-10 LAB — CBC
HCT: 41.5 % (ref 36.0–46.0)
Hemoglobin: 13.8 g/dL (ref 12.0–15.0)
MCH: 29.1 pg (ref 26.0–34.0)
MCHC: 33.3 g/dL (ref 30.0–36.0)
MCV: 87.6 fL (ref 80.0–100.0)
Platelets: 231 10*3/uL (ref 150–400)
RBC: 4.74 MIL/uL (ref 3.87–5.11)
RDW: 13.2 % (ref 11.5–15.5)
WBC: 8 10*3/uL (ref 4.0–10.5)
nRBC: 0 % (ref 0.0–0.2)

## 2018-09-10 LAB — I-STAT BETA HCG BLOOD, ED (MC, WL, AP ONLY): I-stat hCG, quantitative: <5 m[IU]/mL (ref <5)

## 2018-09-10 LAB — TROPONIN I: Troponin I: <0.03 ng/mL (ref <0.03)

## 2018-09-10 MED ORDER — ONDANSETRON 8 MG PO TBDP: 8.0000 mg | ORAL_TABLET | Freq: Three times a day (TID) | ORAL | 0 refills | Status: DC | PRN

## 2018-09-10 MED ORDER — SODIUM CHLORIDE 0.9 % IV BOLUS
1000.0000 mL | Freq: Once | INTRAVENOUS | Status: AC
  Administered 2018-09-10: 1000 mL via INTRAVENOUS

## 2018-09-10 NOTE — ED Provider Notes (Signed)
[facility] EMERGENCY DEPARTMENT Provider Note   CSN: [id number] Arrival date & time: 09/10/18  0847    History   Chief Complaint Chief Complaint  Patient presents with  . Chest Pain    HPI Kathy Rosales Kathy Rosales is a 43 y.o. female.     HPI Patient is a 43 year old female who presents the emergency department nausea vomiting and lightheadedness over the past 3 days.  She has had chills as well as cough and some central chest pain.  No shortness of breath.  She reports that she feels weak and tired.  No known sick contacts.  No known contact with COVID-19 or is under investigation for COVID-19.  No productive cough.  No dysuria or urinary frequency.  Denies diarrhea.  History of congestive heart failure.  No orthopnea or lower extremity edema     Past Medical History:  Diagnosis Date  . Acute systolic CHF (congestive heart failure) ([place]) 05/22/2016  . Blood transfusion without reported diagnosis   . 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 Rheumatoid arthritis of multiple sites with negative rheumatoid factor ([place]) 11/19/2017  . Left-sided weakness   . Unresponsive   . Chronic systolic heart failure ([place]) 05/22/2016  . Mitral regurgitation 05/22/2016  . Near syncope 05/22/2016  . Delirium due to multiple etiologies, persistent, mixed level of activity 12/13/2015  . MDD (major depressive disorder), single episode, moderate ([place]) 12/13/2015  . Fatigue 12/01/2015  . Helicobacter positive gastritis 08/06/2015  . Gastric ulcer 08/06/2015  . Iron deficiency anemia due to chronic blood loss 08/06/2015  . Anemia 07/21/2015  . Iron deficiency anemia 10/22/2014  . B12 deficiency 10/22/2014  . Symptomatic anemia 08/28/2014  . Headache 08/28/2014  . 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 COLONOSCOPY WITH PROPOFOL N/A 07/23/2015   Procedure: COLONOSCOPY  WITH PROPOFOL;  Surgeon: Ronald Lobo, MD;  [facility];  Service: Endoscopy;  Laterality: N/A;  . ESOPHAGOGASTRODUODENOSCOPY (EGD) WITH PROPOFOL N/A 07/23/2015   Procedure: ESOPHAGOGASTRODUODENOSCOPY (EGD) WITH PROPOFOL;  Surgeon: Ronald Lobo, MD;  [facility];  Service: Endoscopy;  Laterality: N/A;  . ESOPHAGOGASTRODUODENOSCOPY (EGD) WITH PROPOFOL N/A 11/18/2015   Procedure: ESOPHAGOGASTRODUODENOSCOPY (EGD) WITH PROPOFOL;  Surgeon: Ronald Lobo, MD;  [facility];  Service: Endoscopy;  Laterality: N/A;  . RIGHT/LEFT HEART CATH AND CORONARY ANGIOGRAPHY N/A 05/22/2016   Procedure: Right/Left Heart Cath and Coronary Angiography;  Surgeon: Peter M Martinique, MD;  Location: [facility];  Service: Cardiovascular;  Laterality: N/A;  . SUBQ ICD IMPLANT N/A 09/27/2016   Procedure: SubQ ICD Implant;  Surgeon: Evans Lance, MD;  Location: [facility];  Service: Cardiovascular;  Laterality: N/A;     OB History   No obstetric history on file.      Home Medications    Prior to Admission medications   Medication Sig Start Date End Date Taking? Authorizing Provider  aspirin EC 81 MG tablet Take 1 tablet (81 mg total) by mouth daily. 05/15/16  Yes Lelon Perla, MD  carvedilol (COREG) 12.5 MG tablet Take 1 tablet (12.5 mg total) by mouth 2 (two) times daily with a meal. 05/31/18  Yes Larey Dresser, MD  digoxin (LANOXIN) 0.125 MG tablet TAKE 1 TABLET BY MOUTH DAILY. Patient taking differently: Take 0.125 mg by mouth daily.  06/25/17  Yes Bensimhon, Shaune Pascal, MD  furosemide (LASIX) 20  MG tablet Take 2 tablets (40 mg total) by mouth daily. 05/23/17  Yes Shirley Friar, PA-C  methocarbamol (ROBAXIN) 500 MG tablet Take 1 tablet (500 mg total) by mouth 2 (two) times daily as needed for muscle spasms. 09/18/17  Yes Ladell Pier, MD  PARoxetine (PAXIL) 20 MG tablet Take 20 mg by mouth daily.   Yes [provider]  Potassium Chloride ER  20 MEQ TBCR Take 20 mEq by mouth daily. 05/23/17  Yes Shirley Friar, PA-C  traZODone (DESYREL) 100 MG tablet Take 200 mg by mouth at bedtime as needed for sleep.    Yes [provider]  artificial tears (LACRILUBE) OINT ophthalmic ointment Place into both eyes every 4 (four) hours as needed for dry eyes. Patient not taking: Reported on 09/10/2018 04/29/17   Emeline General, PA-C  predniSONE (DELTASONE) 10 MG tablet Take 0.5 tablets (5 mg total) by mouth daily with breakfast. Patient not taking: Reported on 09/10/2018 11/19/17   Ladell Pier, MD  sacubitril-valsartan (ENTRESTO) 49-51 MG Take 1 tablet by mouth 2 (two) times daily. Patient not taking: Reported on 09/10/2018 05/23/17   Shirley Friar, PA-C  spironolactone (ALDACTONE) 25 MG tablet TAKE 1 TABLET BY MOUTH DAILY. Patient not taking: Reported on 09/10/2018 12/19/17   Darrick Grinder D, NP  topiramate (TOPAMAX) 25 MG tablet TAKE 1 TABLET BY MOUTH AT BEDTIME. Patient not taking: Reported on 09/10/2018 03/22/18   Ladell Pier, MD    Family History Family History  Problem Relation Age of Onset  . Cancer Paternal Grandfather   . Mental illness Neg Hx     Social History Social History   Tobacco Use  . Smoking status: Never Smoker  . Smokeless tobacco: Never Used  Substance Use Topics  . Alcohol use: No  . Drug use: No     Allergies   Tramadol; Furosemide; and Leflunomide   Review of Systems Review of Systems  All other systems reviewed and are negative.    Physical Exam Updated Vital Signs BP 124/79   Pulse (!) 53   Temp 98.3 F (36.8 C) (Rectal)   Resp (!) 23   Wt 61.2 kg   SpO2 99%   BMI 22.47 kg/m   Physical Exam Vitals signs and nursing note reviewed.  Constitutional:      General: She is not in acute distress.    Appearance: She is well-developed.  HENT:     Head: Normocephalic and atraumatic.  Neck:     Musculoskeletal: Normal range of motion.  Cardiovascular:     Rate  and Rhythm: Normal rate and regular rhythm.     Heart sounds: Normal heart sounds.  Pulmonary:     Effort: Pulmonary effort is normal.     Breath sounds: Normal breath sounds.  Abdominal:     General: There is no distension.     Palpations: Abdomen is soft.     Tenderness: There is no abdominal tenderness.  Musculoskeletal: Normal range of motion.  Skin:    General: Skin is warm and dry.  Neurological:     Mental Status: She is alert and oriented to person, place, and time.  Psychiatric:        Judgment: Judgment normal.      ED Treatments / Results  Labs (all labs ordered are listed, but only abnormal results are displayed) Labs Reviewed  BASIC METABOLIC PANEL - Abnormal; Notable for the following components:      Result Value  Potassium 3.3 (*)    CO2 19 (*)    Calcium 8.6 (*)    All other components within normal limits  NOVEL CORONAVIRUS, NAA (HOSPITAL ORDER, SEND-OUT TO REF LAB)  CBC  TROPONIN I  I-STAT BETA HCG BLOOD, ED ([facility], [facility], [facility] ONLY)    EKG EKG Interpretation  Date/Time:  Tuesday September 10 2018 08:56:59 EDT Ventricular Rate:  65 PR Interval:  128 QRS Duration: 88 QT Interval:  424 QTC Calculation: 440 R Axis:   81 Text Interpretation:  Normal sinus rhythm Normal ECG No significant change was found Confirmed by Jola Schmidt [phone]) on 09/10/2018 10:28:06 AM   Radiology Dg Chest 2 View  Result Date: 09/10/2018 CLINICAL DATA:  Chest pain EXAM: CHEST - 2 VIEW COMPARISON:  11/06/2016 FINDINGS: Implanted generator LEFT chest with subcutaneous defibrillator lead at the anterior chest wall. Upper normal heart size. Mediastinal contours and pulmonary vascularity normal. Lungs clear. No infiltrate, pleural effusion or pneumothorax. Bones unremarkable. IMPRESSION: No acute abnormalities. Electronically Signed   By: Lavonia Dana M.D.   On: 09/10/2018 09:34    Procedures Procedures (including critical care time)  Medications Ordered in ED Medications  sodium  chloride 0.9 % bolus 1,000 mL (1,000 mLs Intravenous New Bag/Given 09/10/18 1059)     Initial Impression / Assessment and Plan / ED Course  I have reviewed the triage vital signs and the nursing notes.  Pertinent labs & imaging results that were available during my care of the patient were reviewed by me and considered in my medical decision making (see chart for details).        Likely viral syndrome.  Could represent COVID-19.  COVID-19 testing performed and will be completed in outpatient testing center.  Chest x-ray clear.  Labs without significant abnormality.  Overall well-appearing.  Feels better after IV fluids.  Likely dehydration.  Discharged home in good condition.  Home quarantine instructions given   Final Clinical Impressions(s) / ED Diagnoses   Final diagnoses:  Viral syndrome    ED Discharge Orders    None       Jola Schmidt, MD 09/10/18 1256

## 2018-09-10 NOTE — ED Triage Notes (Signed)
Pt endorses central chest pain with n/v and dizziness x 3 days. has defibrillator. Denies shob. VSS.

## 2018-09-11 LAB — NOVEL CORONAVIRUS, NAA (HOSP ORDER, SEND-OUT TO REF LAB; TAT 18-24 HRS): SARS-CoV-2, NAA: NOT DETECTED

## 2018-09-17 ENCOUNTER — Ambulatory Visit (INDEPENDENT_AMBULATORY_CARE_PROVIDER_SITE_OTHER): Payer: 59 | Admitting: *Deleted

## 2018-09-17 DIAGNOSIS — I428 Other cardiomyopathies: Secondary | ICD-10-CM

## 2018-09-17 LAB — CUP PACEART REMOTE DEVICE CHECK
Battery Remaining Percentage: 77 %
Date Time Interrogation Session: 20200616005700
Implantable Lead Implant Date: 20180627
Implantable Lead Location: 753862
Implantable Lead Model: 3401
Implantable Lead Serial Number: 113145
Implantable Pulse Generator Implant Date: 20180627
Pulse Gen Serial Number: 222625

## 2018-09-23 ENCOUNTER — Other Ambulatory Visit: Payer: Self-pay

## 2018-09-23 ENCOUNTER — Ambulatory Visit: Payer: 59 | Attending: Internal Medicine | Admitting: Internal Medicine

## 2018-09-28 ENCOUNTER — Encounter: Payer: Self-pay | Admitting: Cardiology

## 2018-09-28 NOTE — Progress Notes (Signed)
Remote ICD transmission.   

## 2018-12-17 ENCOUNTER — Ambulatory Visit (INDEPENDENT_AMBULATORY_CARE_PROVIDER_SITE_OTHER): Payer: 59 | Admitting: *Deleted

## 2018-12-17 DIAGNOSIS — I5022 Chronic systolic (congestive) heart failure: Secondary | ICD-10-CM | POA: Diagnosis not present

## 2018-12-17 DIAGNOSIS — R55 Syncope and collapse: Secondary | ICD-10-CM

## 2018-12-17 LAB — CUP PACEART REMOTE DEVICE CHECK
Battery Remaining Percentage: 75 %
Date Time Interrogation Session: 20200914224100
Implantable Lead Implant Date: 20180627
Implantable Lead Location: 753862
Implantable Lead Model: 3401
Implantable Lead Serial Number: 113145
Implantable Pulse Generator Implant Date: 20180627
Pulse Gen Serial Number: 222625

## 2018-12-25 NOTE — Progress Notes (Signed)
Remote ICD transmission.   

## 2019-01-02 ENCOUNTER — Telehealth: Payer: Self-pay

## 2019-01-02 NOTE — Telephone Encounter (Signed)
LMOVM for pt to stop sending unscheduled transmissions. If the pt has any questions I left my direct office number.

## 2019-03-11 ENCOUNTER — Telehealth: Payer: Self-pay | Admitting: Internal Medicine

## 2019-03-11 NOTE — Telephone Encounter (Signed)
LM on patient's VM via interpreter (Jose from [employer]) requesting call back to DC. Direct number and office hours given. Kathy Rosales is not listed on DPR.   Patient is scheduled for f/u with A. Tillery, [place] on 03/14/19.

## 2019-03-11 NOTE — Telephone Encounter (Signed)
° ° °  New message   Kathy Rosales calling to report discomfort near device for 2 weeks

## 2019-03-14 ENCOUNTER — Encounter: Payer: 59 | Admitting: Student

## 2019-03-14 NOTE — Progress Notes (Deleted)
Electrophysiology Office Note Date: 03/14/2019  ID:  Kathy Rosales, Kathy Rosales 12/16/1975, MRN [id number]  PCP: Ladell Pier, MD Primary Cardiologist: No primary care provider on file. Electrophysiologist: Dr. Lovena Le   CC: Routine ICD follow-up  [facility] Kathy Rosales is a 43 y.o. female seen today for Dr. Lovena Le.  They present today for routine electrophysiology followup.  Since last being seen in our clinic, the patient reports doing very well. They deny chest pain, palpitations, dyspnea, PND, orthopnea, nausea, vomiting, dizziness, syncope, edema, weight gain, or early satiety.  She has not had ICD shocks.   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 Acute systolic CHF (congestive heart failure) ([place]) 05/22/2016  . Blood transfusion without reported diagnosis   . 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 COLONOSCOPY WITH PROPOFOL N/A 07/23/2015   Procedure: COLONOSCOPY WITH PROPOFOL;  Surgeon: Ronald Lobo, MD;  [facility];  Service: Endoscopy;  Laterality: N/A;  . ESOPHAGOGASTRODUODENOSCOPY (EGD) WITH PROPOFOL N/A 07/23/2015   Procedure: ESOPHAGOGASTRODUODENOSCOPY (EGD) WITH PROPOFOL;  Surgeon: Ronald Lobo, MD;  [facility];  Service: Endoscopy;  Laterality: N/A;  . ESOPHAGOGASTRODUODENOSCOPY (EGD) WITH PROPOFOL N/A 11/18/2015   Procedure: ESOPHAGOGASTRODUODENOSCOPY (EGD) WITH PROPOFOL;  Surgeon: Ronald Lobo, MD;  [facility];  Service: Endoscopy;  Laterality: N/A;  . RIGHT/LEFT HEART CATH AND CORONARY ANGIOGRAPHY N/A 05/22/2016   Procedure: Right/Left Heart Cath and Coronary Angiography;  Surgeon: Peter M Martinique, MD;  Location: [facility] [facility];  Service: Cardiovascular;  Laterality: N/A;  . SUBQ ICD IMPLANT N/A  09/27/2016   Procedure: SubQ ICD Implant;  Surgeon: Evans Lance, MD;  Location: [place] CV LAB;  Service: Cardiovascular;  Laterality: N/A;    Current Outpatient Medications  Medication Sig Dispense Refill  . artificial tears (LACRILUBE) OINT ophthalmic ointment Place into both eyes every 4 (four) hours as needed for dry eyes. (Patient not taking: Reported on 09/10/2018) 5 g 0  . aspirin EC 81 MG tablet Take 1 tablet (81 mg total) by mouth daily. 90 tablet 3  . carvedilol (COREG) 12.5 MG tablet Take 1 tablet (12.5 mg total) by mouth 2 (two) times daily with a meal. 30 tablet 6  . digoxin (LANOXIN) 0.125 MG tablet TAKE 1 TABLET BY MOUTH DAILY. (Patient taking differently: Take 0.125 mg by mouth daily. ) 90 tablet 2  . furosemide (LASIX) 20 MG tablet Take 2 tablets (40 mg total) by mouth daily. 180 tablet 3  . methocarbamol (ROBAXIN) 500 MG tablet Take 1 tablet (500 mg total) by mouth 2 (two) times daily as needed for muscle spasms. 20 tablet 0  . ondansetron (ZOFRAN ODT) 8 MG disintegrating tablet Take 1 tablet (8 mg total) by mouth every 8 (eight) hours as needed for nausea or vomiting. 10 tablet 0  . PARoxetine (PAXIL) 20 MG tablet Take 20 mg by mouth daily.    . Potassium Chloride ER 20 MEQ TBCR Take 20 mEq by mouth daily. 90 tablet 3  . predniSONE (DELTASONE) 10 MG tablet Take 0.5 tablets (5 mg total) by mouth daily with breakfast. (Patient not taking: Reported on 09/10/2018) 15 tablet 0  . sacubitril-valsartan (ENTRESTO) 49-51 MG Take 1 tablet by mouth 2 (two) times daily. (Patient not taking: Reported on 09/10/2018) 60 tablet 11  . spironolactone (  ALDACTONE) 25 MG tablet TAKE 1 TABLET BY MOUTH DAILY. (Patient not taking: Reported on 09/10/2018) 30 tablet 5  . topiramate (TOPAMAX) 25 MG tablet TAKE 1 TABLET BY MOUTH AT BEDTIME. (Patient not taking: Reported on 09/10/2018) 30 tablet 0  . traZODone (DESYREL) 100 MG tablet Take 200 mg by mouth at bedtime as needed for sleep.      No current  facility-administered medications for this visit.    Allergies:   Tramadol, Furosemide, and Leflunomide   Social History: Social History   Socioeconomic History  . Marital status: Married    Spouse name: Not on file  . Number of children: 1  . Years of education: Not on file  . Highest education level: Not on file  Occupational History  . Occupation: unemployeed  Tobacco Use  . Smoking status: Never Smoker  . Smokeless tobacco: Never Used  Substance and Sexual Activity  . Alcohol use: No  . Drug use: No  . Sexual activity: Never    Birth control/protection: None  Other Topics Concern  . Not on file  Social History Narrative   ** Merged History Encounter **       ** Merged History Encounter **       Social Determinants of Health   Financial Resource Strain:   . Difficulty of Paying Living Expenses: Not on file  Food Insecurity:   . Worried About Charity fundraiser in the Last Year: Not on file  . Ran Out of Food in the Last Year: Not on file  Transportation Needs:   . Lack of Transportation (Medical): Not on file  . Lack of Transportation (Non-Medical): Not on file  Physical Activity:   . Days of Exercise per Week: Not on file  . Minutes of Exercise per Session: Not on file  Stress:   . Feeling of Stress : Not on file  Social Connections:   . Frequency of Communication with Friends and Family: Not on file  . Frequency of Social Gatherings with Friends and Family: Not on file  . Attends Religious Services: Not on file  . Active Member of Clubs or Organizations: Not on file  . Attends Archivist Meetings: Not on file  . Marital Status: Not on file  Intimate Partner Violence:   . Fear of Current or Ex-Partner: Not on file  . Emotionally Abused: Not on file  . Physically Abused: Not on file  . Sexually Abused: Not on file    Family History: Family History  Problem Relation Age of Onset  . Cancer Paternal Grandfather   . Mental illness Neg Hx      Review of Systems: All other systems reviewed and are otherwise negative except as noted above.   Physical Exam: There were no vitals filed for this visit.   GEN- The patient is well appearing, alert and oriented x 3 today.   HEENT: normocephalic, atraumatic; sclera clear, conjunctiva pink; hearing intact; oropharynx clear; neck supple, no JVP Lymph- no cervical lymphadenopathy Lungs- Clear to ausculation bilaterally, normal work of breathing.  No wheezes, rales, rhonchi Heart- Regular rate and rhythm, no murmurs, rubs or gallops, PMI not laterally displaced GI- soft, non-tender, non-distended, bowel sounds present, no hepatosplenomegaly Extremities- no clubbing, cyanosis, or edema; DP/PT/radial pulses 2+ bilaterally MS- no significant deformity or atrophy Skin- warm and dry, no rash or lesion; ICD pocket well healed Psych- euthymic mood, full affect Neuro- strength and sensation are intact  ICD interrogation- reviewed in detail today,  See PACEART report  EKG:  EKG is not ordered today. The ekg ordered 09/10/2018 shows NSR at 65 bpm, with PR interval of 128 ms, and QRS 88 ms  Recent Labs: 09/10/2018: BUN 16; Creatinine, Ser 0.70; Hemoglobin 13.8; Platelets 231; Potassium 3.3; Sodium 138   Wt Readings from Last 3 Encounters:  09/10/18 135 lb (61.2 kg)  05/31/18 131 lb 12.8 oz (59.8 kg)  11/19/17 137 lb 6.4 oz (62.3 kg)     Other studies Reviewed: Additional studies/ records that were reviewed today include: ***   Assessment and Plan:  1.  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   The patient does not have concerns regarding her medicines.  The following changes were made today:  {NONE DEFAULTED:18576::"none"}  Labs/ tests ordered today  include: *** No orders of the defined types were placed in this encounter.    Disposition:   Follow up with *** {gen number [id number] {TIME; UNITS DAY/WEEK/MONTH:19136}   Signed, Shirley Friar, PA-C  03/14/2019 7:43 AM  [facility] [address] [place] [address] [phone] (office) [phone] (fax)

## 2019-03-18 ENCOUNTER — Ambulatory Visit (INDEPENDENT_AMBULATORY_CARE_PROVIDER_SITE_OTHER): Payer: 59 | Admitting: *Deleted

## 2019-03-18 DIAGNOSIS — I5022 Chronic systolic (congestive) heart failure: Secondary | ICD-10-CM | POA: Diagnosis not present

## 2019-03-19 ENCOUNTER — Telehealth: Payer: Self-pay

## 2019-03-19 NOTE — Telephone Encounter (Signed)
Spoke with patient to remind of missed remote transmission 

## 2019-03-24 LAB — CUP PACEART REMOTE DEVICE CHECK
Battery Remaining Percentage: 72 %
Date Time Interrogation Session: 20201215212000
Implantable Lead Implant Date: 20180627
Implantable Lead Location: 753862
Implantable Lead Model: 3401
Implantable Lead Serial Number: 113145
Implantable Pulse Generator Implant Date: 20180627
Pulse Gen Serial Number: 222625

## 2019-04-15 ENCOUNTER — Other Ambulatory Visit: Payer: Self-pay

## 2019-04-15 ENCOUNTER — Ambulatory Visit (INDEPENDENT_AMBULATORY_CARE_PROVIDER_SITE_OTHER): Payer: 59 | Admitting: Internal Medicine

## 2019-04-15 ENCOUNTER — Encounter: Payer: Self-pay | Admitting: Internal Medicine

## 2019-04-15 VITALS — BP 126/88 | HR 87 | Ht 65.0 in | Wt 134.0 lb

## 2019-04-15 DIAGNOSIS — I428 Other cardiomyopathies: Secondary | ICD-10-CM | POA: Diagnosis not present

## 2019-04-15 DIAGNOSIS — I5022 Chronic systolic (congestive) heart failure: Secondary | ICD-10-CM

## 2019-04-15 DIAGNOSIS — Z95810 Presence of automatic (implantable) cardiac defibrillator: Secondary | ICD-10-CM

## 2019-04-15 MED ORDER — SPIRONOLACTONE 25 MG PO TABS: 25.0000 mg | ORAL_TABLET | Freq: Every day | ORAL | 3 refills | Status: DC

## 2019-04-15 MED ORDER — POTASSIUM CHLORIDE ER 20 MEQ PO TBCR: 20.0000 meq | EXTENDED_RELEASE_TABLET | Freq: Every day | ORAL | 3 refills | Status: DC

## 2019-04-15 MED ORDER — CARVEDILOL 12.5 MG PO TABS: 12.5000 mg | ORAL_TABLET | Freq: Two times a day (BID) | ORAL | 3 refills | Status: DC

## 2019-04-15 MED ORDER — DIGOXIN 125 MCG PO TABS: 125.0000 ug | ORAL_TABLET | Freq: Every day | ORAL | 3 refills | Status: DC

## 2019-04-15 MED ORDER — FUROSEMIDE 20 MG PO TABS: 40.0000 mg | ORAL_TABLET | Freq: Every day | ORAL | 3 refills | Status: DC

## 2019-04-15 MED ORDER — ENTRESTO 49-51 MG PO TABS: 1.0000 | ORAL_TABLET | Freq: Two times a day (BID) | ORAL | 3 refills | Status: DC

## 2019-04-15 MED ORDER — ASPIRIN EC 81 MG PO TBEC: 81.0000 mg | DELAYED_RELEASE_TABLET | Freq: Every day | ORAL | 3 refills | Status: DC

## 2019-04-15 NOTE — Patient Instructions (Signed)
Medication Instructions:  Your physician recommends that you continue on your current medications as directed. Please refer to the Current Medication list given to you today.  Labwork: None ordered.  Testing/Procedures: None ordered.  Follow-Up: Your physician wants you to follow-up in: one year with Dr. Lovena Le.   You will receive a reminder letter in the mail two months in advance. If you don't receive a letter, please call our office to schedule the follow-up appointment.  Remote monitoring is used to monitor your ICD from home. This monitoring reduces the number of office visits required to check your device to one time per year. It allows [place] to keep an eye on the functioning of your device to ensure it is working properly. You are scheduled for a device check from home on 06/17/2019. You may send your transmission at any time that day. If you have a wireless device, the transmission will be sent automatically. After your physician reviews your transmission, you will receive a postcard with your next transmission date.  Any Other Special Instructions Will Be Listed Below (If Applicable).  If you need a refill on your cardiac medications before your next appointment, please call your pharmacy.

## 2019-04-15 NOTE — Progress Notes (Signed)
HPI The patient is a pleasant 44 yo woman with a h/o chronic systolic heart failure, s/p S-ICD insertion, and DCM. In the interim, she has done well. She has occaisional episodes of non-exertional chest pain, often associated with lying down. She has not had any ICD therapies. Allergies  Allergen Reactions  . Tramadol Itching and Other (See Comments)    Red spots all over body and itching  . Furosemide Rash  . Leflunomide Rash     Current Outpatient Medications  Medication Sig Dispense Refill  . aspirin EC 81 MG tablet Take 1 tablet (81 mg total) by mouth daily. 90 tablet 3  . carvedilol (COREG) 12.5 MG tablet Take 1 tablet (12.5 mg total) by mouth 2 (two) times daily with a meal. 30 tablet 6  . digoxin (LANOXIN) 0.125 MG tablet TAKE 1 TABLET BY MOUTH DAILY. 90 tablet 2  . furosemide (LASIX) 20 MG tablet Take 2 tablets (40 mg total) by mouth daily. 180 tablet 3  . methocarbamol (ROBAXIN) 500 MG tablet Take 1 tablet (500 mg total) by mouth 2 (two) times daily as needed for muscle spasms. 20 tablet 0  . ondansetron (ZOFRAN ODT) 8 MG disintegrating tablet Take 1 tablet (8 mg total) by mouth every 8 (eight) hours as needed for nausea or vomiting. 10 tablet 0  . PARoxetine (PAXIL) 20 MG tablet Take 20 mg by mouth daily.    . Potassium Chloride ER 20 MEQ TBCR Take 20 mEq by mouth daily. 90 tablet 3  . predniSONE (DELTASONE) 10 MG tablet Take 0.5 tablets (5 mg total) by mouth daily with breakfast. 15 tablet 0  . sacubitril-valsartan (ENTRESTO) 49-51 MG Take 1 tablet by mouth 2 (two) times daily. 60 tablet 11  . spironolactone (ALDACTONE) 25 MG tablet TAKE 1 TABLET BY MOUTH DAILY. 30 tablet 5  . topiramate (TOPAMAX) 25 MG tablet TAKE 1 TABLET BY MOUTH AT BEDTIME. 30 tablet 0  . traZODone (DESYREL) 100 MG tablet Take 200 mg by mouth at bedtime as needed for sleep.      No current facility-administered medications for this visit.     Past Medical History:  Diagnosis Date  . Acute  systolic CHF (congestive heart failure) ([place]) 05/22/2016  . Blood transfusion without reported diagnosis   . 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   Past Surgical History:  Procedure Laterality Date  . COLONOSCOPY WITH PROPOFOL N/A 07/23/2015   Procedure: COLONOSCOPY WITH PROPOFOL;  Surgeon: Ronald Lobo, MD;  Location: [facility];  Service: Endoscopy;  Laterality: N/A;  . ESOPHAGOGASTRODUODENOSCOPY (EGD) WITH PROPOFOL N/A 07/23/2015   Procedure: ESOPHAGOGASTRODUODENOSCOPY (EGD) WITH PROPOFOL;  Surgeon: Ronald Lobo, MD;  Location: [facility];  Service: Endoscopy;  Laterality: N/A;  . ESOPHAGOGASTRODUODENOSCOPY (EGD) WITH PROPOFOL N/A 11/18/2015   Procedure: ESOPHAGOGASTRODUODENOSCOPY (EGD) WITH PROPOFOL;  Surgeon: Ronald Lobo, MD;  Location: [facility];  Service: Endoscopy;  Laterality: N/A;  . RIGHT/LEFT HEART CATH AND CORONARY ANGIOGRAPHY N/A 05/22/2016   Procedure: Right/Left Heart Cath and Coronary Angiography;  Surgeon: Peter M Martinique, MD;  Location: [facility] [facility];  Service: Cardiovascular;  Laterality: N/A;  . SUBQ ICD IMPLANT N/A 09/27/2016   Procedure: SubQ ICD Implant;  Surgeon: Evans Lance, MD;  Location: [place] [facility];  Service: Cardiovascular;  Laterality: N/A;     Family History  Problem Relation Age of Onset  . Cancer Paternal Grandfather   .  Mental illness Neg Hx      Social History   Socioeconomic History  . Marital status: Married    Spouse name: Not on file  . Number of children: 1  . Years of education: Not on file  . Highest education level: Not on file  Occupational History  . Occupation: unemployeed  Tobacco Use  . Smoking status: Never Smoker  . Smokeless tobacco: Never Used  Substance and Sexual Activity  . Alcohol use: No  . Drug use: No  . Sexual activity: Never    Birth control/protection: None  Other Topics Concern  . Not on file  Social History Narrative    ** Merged History Encounter **       ** Merged History Encounter **       Social Determinants of Health   Financial Resource Strain:   . Difficulty of Paying Living Expenses: Not on file  Food Insecurity:   . Worried About Charity fundraiser in the Last Year: Not on file  . Ran Out of Food in the Last Year: Not on file  Transportation Needs:   . Lack of Transportation (Medical): Not on file  . Lack of Transportation (Non-Medical): Not on file  Physical Activity:   . Days of Exercise per Week: Not on file  . Minutes of Exercise per Session: Not on file  Stress:   . Feeling of Stress : Not on file  Social Connections:   . Frequency of Communication with Friends and Family: Not on file  . Frequency of Social Gatherings with Friends and Family: Not on file  . Attends Religious Services: Not on file  . Active Member of Clubs or Organizations: Not on file  . Attends Archivist Meetings: Not on file  . Marital Status: Not on file  Intimate Partner Violence:   . Fear of Current or Ex-Partner: Not on file  . Emotionally Abused: Not on file  . Physically Abused: Not on file  . Sexually Abused: Not on file     BP 126/88   Pulse 87   Ht 5\' 5"  (1.651 m)   Wt 134 lb (60.8 kg)   SpO2 99%   BMI 22.30 kg/m   Physical Exam:  Well appearing NAD HEENT: Unremarkable Neck:  No JVD, no thyromegally Lymphatics:  No adenopathy Back:  No CVA tenderness Lungs:  Clear with no wheezes HEART:  Regular rate rhythm, no murmurs, no rubs, no clicks Abd:  soft, positive bowel sounds, no organomegally, no rebound, no guarding Ext:  2 plus pulses, no edema, no cyanosis, no clubbing Skin:  No rashes no nodules Neuro:  CN II through XII intact, motor grossly intact  EKG - nsr  DEVICE  Normal device function.  See PaceArt for details.   Assess/Plan: 1. Chronic systolic heart failure - her symptoms are class 2. I encouraged her to continue her current meds. 2. ICD - her [employer]  S-ICD is working normally with no ventricular arrhythmias.  3. Chest pain - her symptoms appear to be non-cardiac. I have recommended she try some Prilosec.   Mikle Bosworth.D.

## 2019-05-16 ENCOUNTER — Ambulatory Visit: Payer: 59 | Attending: Internal Medicine | Admitting: Internal Medicine

## 2019-05-16 ENCOUNTER — Other Ambulatory Visit: Payer: Self-pay

## 2019-05-16 DIAGNOSIS — G43009 Migraine without aura, not intractable, without status migrainosus: Secondary | ICD-10-CM | POA: Diagnosis not present

## 2019-05-16 MED ORDER — TOPIRAMATE 25 MG PO TABS: 25.0000 mg | ORAL_TABLET | Freq: Every day | ORAL | 1 refills | Status: DC

## 2019-05-16 MED ORDER — BUTALBITAL-APAP-CAFFEINE 50-325-40 MG PO TABS: 1.0000 | ORAL_TABLET | Freq: Two times a day (BID) | ORAL | 1 refills | Status: DC | PRN

## 2019-05-16 NOTE — Progress Notes (Signed)
Virtual Visit via Telephone Note Due to current restrictions/limitations of in-office visits due to the COVID-19 pandemic, this scheduled clinical appointment was converted to a telehealth visit  I connected with Kathy Rosales on 05/16/19 at 4:46 p.m by telephone and verified that I am speaking with the correct person using two identifiers.   I discussed the limitations, risks, security and privacy concerns of performing an evaluation and management service by telephone and the availability of in person appointments. I also discussed with the patient that there may be a patient responsible charge related to this service. The patient expressed understanding and agreed to proceed.   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  Followed by [facility]).  Last seen 11/2017.  Patient complains of daily headaches x3 weeks. Headaches are left-sided.  They come on gradually and build in intensity. Last 1-1/2 days. Endorses significant photophobia and phenyl phobia No N/V/blurred vision No triggers identified. She has been taking BC powder a couple of them every 3-4 hours but they upset her stomach and give minimal relief from the headache. Reports having occasional headaches in the past that would usually subside with taking an aspirin  Current Outpatient Medications on File Prior to Visit  Medication Sig Dispense Refill  . aspirin EC 81 MG tablet Take 1 tablet (81 mg total) by mouth daily. 90 tablet 3  . carvedilol (COREG) 12.5 MG tablet Take 1 tablet (12.5 mg total) by mouth 2 (two) times daily with a meal. 180 tablet 3  . digoxin (LANOXIN) 0.125 MG tablet Take 1 tablet (125 mcg total) by mouth daily. 90 tablet 3  . furosemide (LASIX) 20 MG tablet Take 2 tablets (40 mg total) by mouth daily. 180 tablet 3  . methocarbamol (ROBAXIN) 500 MG tablet Take 1 tablet  (500 mg total) by mouth 2 (two) times daily as needed for muscle spasms. 20 tablet 0  . ondansetron (ZOFRAN ODT) 8 MG disintegrating tablet Take 1 tablet (8 mg total) by mouth every 8 (eight) hours as needed for nausea or vomiting. 10 tablet 0  . PARoxetine (PAXIL) 20 MG tablet Take 20 mg by mouth daily.    . Potassium Chloride ER 20 MEQ TBCR Take 20 mEq by mouth daily. 90 tablet 3  . predniSONE (DELTASONE) 10 MG tablet Take 0.5 tablets (5 mg total) by mouth daily with breakfast. 15 tablet 0  . sacubitril-valsartan (ENTRESTO) 49-51 MG Take 1 tablet by mouth 2 (two) times daily. 180 tablet 3  . spironolactone (ALDACTONE) 25 MG tablet Take 1 tablet (25 mg total) by mouth daily. 90 tablet 3  . topiramate (TOPAMAX) 25 MG tablet TAKE 1 TABLET BY MOUTH AT BEDTIME. 30 tablet 0  . traZODone (DESYREL) 100 MG tablet Take 200 mg by mouth at bedtime as needed for sleep.      No current facility-administered medications on file prior to visit.    Observations/Objective:  Lab Results  Component Value Date   WBC 8.0 09/10/2018   HGB 13.8 09/10/2018   HCT 41.5 09/10/2018   MCV 87.6 09/10/2018   PLT 231 09/10/2018     Chemistry      Component Value Date/Time   NA 138 09/10/2018 0857   NA 141 09/22/2016 0856   NA 139 11/30/2015 0944   K 3.3 (L) 09/10/2018 0857   K 3.5 11/30/2015 0944   CL 110 09/10/2018 0857  CO2 19 (L) 09/10/2018 0857   CO2 23 11/30/2015 0944   BUN 16 09/10/2018 0857   BUN 15 09/22/2016 0856   BUN 9.1 11/30/2015 0944   CREATININE 0.70 09/10/2018 0857   CREATININE 0.70 06/15/2016 1346   CREATININE 0.6 11/30/2015 0944   GLU 72 10/12/2014 0000      Component Value Date/Time   CALCIUM 8.6 (L) 09/10/2018 0857   CALCIUM 8.5 11/30/2015 0944   ALKPHOS 141 (H) 10/19/2017 1226   ALKPHOS 100 11/30/2015 0944   AST 25 10/19/2017 1226   AST 24 11/30/2015 0944   ALT 16 10/19/2017 1226   ALT 13 11/30/2015 0944   BILITOT <0.2 10/19/2017 1226   BILITOT <0.30 11/30/2015 0944       Assessment and Plan: 1. Migraine without aura and without status migrainosus, not intractable Sounds like migraine type headache.  Discussed management of migraines to include prophylactic therapy and abortive therapy.  Given her heart condition I do not think we can use Imitrex so I will put her on Fioricet instead.  I have informed patient that this medication can be habit-forming and she is to take it only when needed.  I had placed her on Topamax in the past for headaches but she has been off of it for a while.  I will have her restart Topamax at bedtime for prophylaxis. - topiramate (TOPAMAX) 25 MG tablet; Take 1 tablet (25 mg total) by mouth at bedtime.  Dispense: 30 tablet; Refill: 1 - butalbital-acetaminophen-caffeine (FIORICET) 50-325-40 MG tablet; Take 1-2 tablets by mouth 2 (two) times daily as needed for headache or migraine.  Dispense: 30 tablet; Refill: 1   Follow Up Instructions: Follow-up in 3 weeks in person.   I discussed the assessment and treatment plan with the patient. The patient was provided an opportunity to ask questions and all were answered. The patient agreed with the plan and demonstrated an understanding of the instructions.   The patient was advised to call back or seek an in-person evaluation if the symptoms worsen or if the condition fails to improve as anticipated.  I provided 17 minutes of non-face-to-face time during this encounter.   Karle Plumber, MD

## 2019-06-02 LAB — CUP PACEART INCLINIC DEVICE CHECK
Date Time Interrogation Session: 20210112165309
Implantable Lead Implant Date: 20180627
Implantable Lead Location: 753862
Implantable Lead Model: 3401
Implantable Lead Serial Number: 113145
Implantable Pulse Generator Implant Date: 20180627
Pulse Gen Serial Number: 222625

## 2019-06-06 ENCOUNTER — Ambulatory Visit (INDEPENDENT_AMBULATORY_CARE_PROVIDER_SITE_OTHER): Payer: 59 | Admitting: Internal Medicine

## 2019-06-06 ENCOUNTER — Encounter: Payer: Self-pay | Admitting: Internal Medicine

## 2019-06-06 ENCOUNTER — Other Ambulatory Visit: Payer: Self-pay

## 2019-06-06 VITALS — BP 122/70 | HR 81 | Ht 63.5 in | Wt 133.0 lb

## 2019-06-06 DIAGNOSIS — Z95810 Presence of automatic (implantable) cardiac defibrillator: Secondary | ICD-10-CM | POA: Diagnosis not present

## 2019-06-06 DIAGNOSIS — I5022 Chronic systolic (congestive) heart failure: Secondary | ICD-10-CM | POA: Diagnosis not present

## 2019-06-06 NOTE — Progress Notes (Signed)
HPI Kathy Rosales returns today for followup. She is a pleasant 44 yo woman with a non-ischemic CM, chronic class 2 CHF, s/p S-ICD insertion. She has done well in the interim but has had occaisional pain over her ICD insertion site. The patient denies syncope. No ICD therapies. No edema.  Allergies  Allergen Reactions  . Tramadol Itching and Other (See Comments)    Red spots all over body and itching  . Furosemide Rash  . Leflunomide Rash     Current Outpatient Medications  Medication Sig Dispense Refill  . aspirin EC 81 MG tablet Take 1 tablet (81 mg total) by mouth daily. 90 tablet 3  . butalbital-acetaminophen-caffeine (FIORICET) 50-325-40 MG tablet Take 1-2 tablets by mouth 2 (two) times daily as needed for headache or migraine. 30 tablet 1  . carvedilol (COREG) 12.5 MG tablet Take 1 tablet (12.5 mg total) by mouth 2 (two) times daily with a meal. 180 tablet 3  . digoxin (LANOXIN) 0.125 MG tablet Take 1 tablet (125 mcg total) by mouth daily. 90 tablet 3  . furosemide (LASIX) 20 MG tablet Take 2 tablets (40 mg total) by mouth daily. 180 tablet 3  . methocarbamol (ROBAXIN) 500 MG tablet Take 1 tablet (500 mg total) by mouth 2 (two) times daily as needed for muscle spasms. 20 tablet 0  . ondansetron (ZOFRAN ODT) 8 MG disintegrating tablet Take 1 tablet (8 mg total) by mouth every 8 (eight) hours as needed for nausea or vomiting. 10 tablet 0  . PARoxetine (PAXIL) 20 MG tablet Take 20 mg by mouth daily.    . Potassium Chloride ER 20 MEQ TBCR Take 20 mEq by mouth daily. 90 tablet 3  . predniSONE (DELTASONE) 10 MG tablet Take 0.5 tablets (5 mg total) by mouth daily with breakfast. 15 tablet 0  . sacubitril-valsartan (ENTRESTO) 49-51 MG Take 1 tablet by mouth 2 (two) times daily. 180 tablet 3  . spironolactone (ALDACTONE) 25 MG tablet Take 1 tablet (25 mg total) by mouth daily. 90 tablet 3  . topiramate (TOPAMAX) 25 MG tablet Take 1 tablet (25 mg total) by mouth at bedtime. 30 tablet 1    . traZODone (DESYREL) 100 MG tablet Take 200 mg by mouth at bedtime as needed for sleep.      No current facility-administered medications for this visit.     Past Medical History:  Diagnosis Date  . Acute systolic CHF (congestive heart failure) ([place]) 05/22/2016  . Blood transfusion without reported diagnosis   . 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   Past Surgical History:  Procedure Laterality Date  . COLONOSCOPY WITH PROPOFOL N/A 07/23/2015   Procedure: COLONOSCOPY WITH PROPOFOL;  Surgeon: Ronald Lobo, MD;  [facility]: [facility];  Service: Endoscopy;  Laterality: N/A;  . ESOPHAGOGASTRODUODENOSCOPY (EGD) WITH PROPOFOL N/A 07/23/2015   Procedure: ESOPHAGOGASTRODUODENOSCOPY (EGD) WITH PROPOFOL;  Surgeon: Ronald Lobo, MD;  [facility];  Service: Endoscopy;  Laterality: N/A;  . ESOPHAGOGASTRODUODENOSCOPY (EGD) WITH PROPOFOL N/A 11/18/2015   Procedure: ESOPHAGOGASTRODUODENOSCOPY (EGD) WITH PROPOFOL;  Surgeon: Ronald Lobo, MD;  [facility]: [facility];  Service: Endoscopy;  Laterality: N/A;  . RIGHT/LEFT HEART CATH AND CORONARY ANGIOGRAPHY N/A 05/22/2016   Procedure: Right/Left Heart Cath and Coronary Angiography;  Surgeon: Peter M Martinique, MD;  Location: [place] [facility];  Service: Cardiovascular;  Laterality: N/A;  . SUBQ ICD IMPLANT N/A 09/27/2016   Procedure: SubQ  ICD Implant;  Surgeon: Evans Lance, MD;  Location: [place] [facility];  Service: Cardiovascular;  Laterality: N/A;     Family History  Problem Relation Age of Onset  . Cancer Paternal Grandfather   . Mental illness Neg Hx      Social History   Socioeconomic History  . Marital status: Married    Spouse name: Not on file  . Number of children: 1  . Years of education: Not on file  . Highest education level: Not on file  Occupational History  . Occupation: unemployeed  Tobacco Use  . Smoking status: Never Smoker  . Smokeless  tobacco: Never Used  Substance and Sexual Activity  . Alcohol use: No  . Drug use: No  . Sexual activity: Never    Birth control/protection: None  Other Topics Concern  . Not on file  Social History Narrative   ** Merged History Encounter **       ** Merged History Encounter **       Social Determinants of Health   Financial Resource Strain:   . Difficulty of Paying Living Expenses: Not on file  Food Insecurity:   . Worried About Charity fundraiser in the Last Year: Not on file  . Ran Out of Food in the Last Year: Not on file  Transportation Needs:   . Lack of Transportation (Medical): Not on file  . Lack of Transportation (Non-Medical): Not on file  Physical Activity:   . Days of Exercise per Week: Not on file  . Minutes of Exercise per Session: Not on file  Stress:   . Feeling of Stress : Not on file  Social Connections:   . Frequency of Communication with Friends and Family: Not on file  . Frequency of Social Gatherings with Friends and Family: Not on file  . Attends Religious Services: Not on file  . Active Member of Clubs or Organizations: Not on file  . Attends Archivist Meetings: Not on file  . Marital Status: Not on file  Intimate Partner Violence:   . Fear of Current or Ex-Partner: Not on file  . Emotionally Abused: Not on file  . Physically Abused: Not on file  . Sexually Abused: Not on file     BP 122/70   Pulse 81   Ht 5' 3.5" (1.613 m)   Wt 133 lb (60.3 kg)   SpO2 96%   BMI 23.19 kg/m   Physical Exam:  Well appearing NAD HEENT: Unremarkable Neck:  No JVD, no thyromegally Lymphatics:  No adenopathy Back:  No CVA tenderness Lungs:  Clear with no wheezes HEART:  Regular rate rhythm, no murmurs, no rubs, no clicks Abd:  soft, positive bowel sounds, no organomegally, no rebound, no guarding Ext:  2 plus pulses, no edema, no cyanosis, no clubbing Skin:  No rashes no nodules Neuro:  CN II through XII intact, motor grossly  intact  DEVICE  Normal device function.  See PaceArt for details.   Assess/Plan: 1. Chronic systolic heart failure - her EF has actually improved from 25% to 35% to 45% from 2018, 2019, and 2020. I have recommended she continue her current meds and maintain a low sodium diet. We will recheck her EF in 2022.  2. ICD - Her [employer] S-ICD is working normally. She would like to have the device removed if her EF continues to remain above 40%. I have recommended she keep it in place until it reaches ERI.  Mikle Bosworth.D.

## 2019-06-06 NOTE — Patient Instructions (Signed)
Medication Instructions:  Your physician recommends that you continue on your current medications as directed. Please refer to the Current Medication list given to you today.  Labwork: None ordered.  Testing/Procedures: None ordered.  Follow-Up: Your physician wants you to follow-up in: per previous recall.  Remote monitoring is used to monitor your ICD from home. This monitoring reduces the number of office visits required to check your device to one time per year. It allows [place] to keep an eye on the functioning of your device to ensure it is working properly. You are scheduled for a device check from home on 06/17/2019. You may send your transmission at any time that day. If you have a wireless device, the transmission will be sent automatically. After your physician reviews your transmission, you will receive a postcard with your next transmission date.  Any Other Special Instructions Will Be Listed Below (If Applicable).  If you need a refill on your cardiac medications before your next appointment, please call your pharmacy.

## 2019-06-09 ENCOUNTER — Other Ambulatory Visit: Payer: Self-pay

## 2019-06-09 ENCOUNTER — Encounter: Payer: Self-pay | Admitting: Internal Medicine

## 2019-06-09 ENCOUNTER — Ambulatory Visit: Payer: 59 | Attending: Internal Medicine | Admitting: Internal Medicine

## 2019-06-09 VITALS — BP 118/78 | HR 76 | Temp 97.9°F | Resp 16 | Wt 134.8 lb

## 2019-06-09 DIAGNOSIS — F325 Major depressive disorder, single episode, in full remission: Secondary | ICD-10-CM | POA: Diagnosis not present

## 2019-06-09 DIAGNOSIS — R400 Somnolence: Secondary | ICD-10-CM

## 2019-06-09 DIAGNOSIS — G43009 Migraine without aura, not intractable, without status migrainosus: Secondary | ICD-10-CM | POA: Diagnosis not present

## 2019-06-09 DIAGNOSIS — R0683 Snoring: Secondary | ICD-10-CM

## 2019-06-09 DIAGNOSIS — D649 Anemia, unspecified: Secondary | ICD-10-CM

## 2019-06-09 DIAGNOSIS — R5383 Other fatigue: Secondary | ICD-10-CM

## 2019-06-09 DIAGNOSIS — M0609 Rheumatoid arthritis without rheumatoid factor, multiple sites: Secondary | ICD-10-CM | POA: Diagnosis not present

## 2019-06-09 NOTE — Progress Notes (Addendum)
Patient ID: Kathy Rosales, female    DOB: 27-Oct-1975  MRN: [id number]  CC: Migraine   Subjective: Kathy Rosales is a 44 y.o. female who presents for 1 month follow-up on migraine headaches.   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 Followed by [facility]).   For f/u HA.  Dx with migraine on televisit 4 wks ago.  Started on Topamax and Fioricet.  She reports significant decrease in headache frequency from daily now to no more than twice a week but most of the times just once a week.  She is tolerating the Topamax.  When she has headaches she takes the Fioricet just once with good relief.  Main complaint today is decreased energy for the past 3 weeks.  She sleeps about 8 hours at night.  Endorses snoring at times.  She wakes up in the mornings feeling unrefreshed and is sleepy during the day.     No wgh changes.    RA: on Embrel Q mth through the rheumatologist.  She feels her RA is under good control with this.  Depression:  Doing well on Paxil.  Patient Active Problem List   Diagnosis Date Noted  . Migraine without aura and without status migrainosus, not intractable 05/16/2019  . 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  . Rheumatoid arthritis of multiple sites with negative rheumatoid factor ([place]) 11/19/2017  . Left-sided weakness   . Unresponsive   . Chronic systolic heart failure ([place]) 05/22/2016  . Mitral regurgitation 05/22/2016  . Near syncope 05/22/2016  . Delirium due to multiple etiologies, persistent, mixed level of activity 12/13/2015  . MDD (major depressive disorder), single episode, moderate ([place]) 12/13/2015  . Fatigue 12/01/2015  . Helicobacter positive gastritis 08/06/2015  . Gastric ulcer 08/06/2015  . Iron deficiency anemia due to chronic blood loss 08/06/2015  . Anemia  07/21/2015  . Iron deficiency anemia 10/22/2014  . B12 deficiency 10/22/2014  . Symptomatic anemia 08/28/2014  . Headache 08/28/2014  . 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 aspirin EC 81 MG tablet Take 1 tablet (81 mg total) by mouth daily. 90 tablet 3  . butalbital-acetaminophen-caffeine (FIORICET) 50-325-40 MG tablet Take 1-2 tablets by mouth 2 (two) times daily as needed for headache or migraine. 30 tablet 1  . carvedilol (COREG) 12.5 MG tablet Take 1 tablet (12.5 mg total) by mouth 2 (two) times daily with a meal. 180 tablet 3  . digoxin (LANOXIN) 0.125 MG tablet Take 1 tablet (125 mcg total) by mouth daily. 90 tablet 3  . furosemide (LASIX) 20 MG tablet Take 2 tablets (40 mg total) by mouth daily. 180 tablet 3  . methocarbamol (ROBAXIN) 500 MG tablet Take 1 tablet (500 mg total) by mouth 2 (two) times daily as needed for muscle spasms. 20 tablet 0  . ondansetron (ZOFRAN ODT) 8 MG disintegrating tablet Take 1 tablet (8 mg total) by mouth every 8 (eight) hours as needed for nausea or vomiting. 10 tablet 0  . PARoxetine (PAXIL) 20 MG tablet Take 20 mg by mouth daily.    . Potassium Chloride ER 20 MEQ TBCR Take 20 mEq by mouth daily. 90 tablet 3  . predniSONE (DELTASONE) 10 MG tablet Take 0.5 tablets (5 mg total) by mouth daily with breakfast. 15 tablet  0  . sacubitril-valsartan (ENTRESTO) 49-51 MG Take 1 tablet by mouth 2 (two) times daily. 180 tablet 3  . spironolactone (ALDACTONE) 25 MG tablet Take 1 tablet (25 mg total) by mouth daily. 90 tablet 3  . topiramate (TOPAMAX) 25 MG tablet Take 1 tablet (25 mg total) by mouth at bedtime. 30 tablet 1  . traZODone (DESYREL) 100 MG tablet Take 200 mg by mouth at bedtime as needed for sleep.      No current facility-administered medications on file prior to visit.    Allergies  Allergen Reactions  . Tramadol Itching and Other (See Comments)    Red spots  all over body and itching  . Furosemide Rash  . Leflunomide Rash    Social History   Socioeconomic History  . Marital status: Married    Spouse name: Not on file  . Number of children: 1  . Years of education: Not on file  . Highest education level: Not on file  Occupational History  . Occupation: unemployeed  Tobacco Use  . Smoking status: Never Smoker  . Smokeless tobacco: Never Used  Substance and Sexual Activity  . Alcohol use: No  . Drug use: No  . Sexual activity: Never    Birth control/protection: None  Other Topics Concern  . Not on file  Social History Narrative   ** Merged History Encounter **       ** Merged History Encounter **       Social Determinants of Health   Financial Resource Strain:   . Difficulty of Paying Living Expenses: Not on file  Food Insecurity:   . Worried About Charity fundraiser in the Last Year: Not on file  . Ran Out of Food in the Last Year: Not on file  Transportation Needs:   . Lack of Transportation (Medical): Not on file  . Lack of Transportation (Non-Medical): Not on file  Physical Activity:   . Days of Exercise per Week: Not on file  . Minutes of Exercise per Session: Not on file  Stress:   . Feeling of Stress : Not on file  Social Connections:   . Frequency of Communication with Friends and Family: Not on file  . Frequency of Social Gatherings with Friends and Family: Not on file  . Attends Religious Services: Not on file  . Active Member of Clubs or Organizations: Not on file  . Attends Archivist Meetings: Not on file  . Marital Status: Not on file  Intimate Partner Violence:   . Fear of Current or Ex-Partner: Not on file  . Emotionally Abused: Not on file  . Physically Abused: Not on file  . Sexually Abused: Not on file    Family History  Problem Relation Age of Onset  . Cancer Paternal Grandfather   . Mental illness Neg Hx     Past Surgical History:  Procedure Laterality Date  . COLONOSCOPY  WITH PROPOFOL N/A 07/23/2015   Procedure: COLONOSCOPY WITH PROPOFOL;  Surgeon: Ronald Lobo, MD;  Location: [facility];  Service: Endoscopy;  Laterality: N/A;  . ESOPHAGOGASTRODUODENOSCOPY (EGD) WITH PROPOFOL N/A 07/23/2015   Procedure: ESOPHAGOGASTRODUODENOSCOPY (EGD) WITH PROPOFOL;  Surgeon: Ronald Lobo, MD;  Location: [facility];  Service: Endoscopy;  Laterality: N/A;  . ESOPHAGOGASTRODUODENOSCOPY (EGD) WITH PROPOFOL N/A 11/18/2015   Procedure: ESOPHAGOGASTRODUODENOSCOPY (EGD) WITH PROPOFOL;  Surgeon: Ronald Lobo, MD;  Location: [facility];  Service: Endoscopy;  Laterality: N/A;  . RIGHT/LEFT HEART CATH AND CORONARY ANGIOGRAPHY N/A 05/22/2016   Procedure:  Right/Left Heart Cath and Coronary Angiography;  Surgeon: Peter M Martinique, MD;  Location: [place] [facility];  Service: Cardiovascular;  Laterality: N/A;  . SUBQ ICD IMPLANT N/A 09/27/2016   Procedure: SubQ ICD Implant;  Surgeon: Evans Lance, MD;  Location: [place] [facility];  Service: Cardiovascular;  Laterality: N/A;    ROS: Review of Systems Negative except as stated above  PHYSICAL EXAM: BP 118/78   Pulse 76   Temp 97.9 F (36.6 C)   Resp 16   Wt 134 lb 12.8 oz (61.1 kg)   SpO2 99%   BMI 23.50 kg/m   Wt Readings from Last 3 Encounters:  06/09/19 134 lb 12.8 oz (61.1 kg)  06/06/19 133 lb (60.3 kg)  04/15/19 134 lb (60.8 kg)    Physical Exam  General appearance - alert, well appearing, and in no distress Mental status - normal mood, behavior, speech, dress, motor activity, and thought processes Mouth - mucous membranes moist, pharynx normal without lesions Chest - clear to auscultation, no wheezes, rales or rhonchi, symmetric air entry Heart - normal rate, regular rhythm, normal S1, S2, no murmurs, rubs, clicks or gallops Neurological - cranial nerves II through XII intact, motor and sensory grossly normal bilaterally   CMP Latest Ref Rng & Units 09/10/2018 05/31/2018 11/05/2017  Glucose 70 - 99 mg/dL 94  107(H) 92  BUN 6 - 20 mg/dL 16 12 10   Creatinine 0.44 - 1.00 mg/dL 0.70 0.60 0.58  Sodium 135 - 145 mmol/L 138 135 141  Potassium 3.5 - 5.1 mmol/L 3.3(L) 4.6 3.5  Chloride 98 - 111 mmol/L 110 106 102  CO2 22 - 32 mmol/L 19(L) 22 28  Calcium 8.9 - 10.3 mg/dL 8.6(L) 8.7(L) 8.9  Total Protein 6.0 - 8.5 g/dL - - -  Total Bilirubin 0.0 - 1.2 mg/dL - - -  Alkaline Phos 39 - 117 IU/L - - -  AST 0 - 40 IU/L - - -  ALT 0 - 32 IU/L - - -   Lipid Panel     Component Value Date/Time   CHOL 149 12/14/2015 0628   TRIG 180 (H) 12/14/2015 0628   HDL 40 (L) 12/14/2015 0628   CHOLHDL 3.7 12/14/2015 0628   VLDL 36 12/14/2015 0628   LDLCALC 73 12/14/2015 0628    CBC    Component Value Date/Time   WBC 8.0 09/10/2018 0857   RBC 4.74 09/10/2018 0857   HGB 13.8 09/10/2018 0857   HGB 14.2 11/10/2016 1013   HGB 11.2 (L) 11/30/2015 0944   HCT 41.5 09/10/2018 0857   HCT 42.0 11/10/2016 1013   HCT 36.5 11/30/2015 0944   PLT 231 09/10/2018 0857   PLT 223 11/10/2016 1013   MCV 87.6 09/10/2018 0857   MCV 92 11/10/2016 1013   MCV 83.0 11/30/2015 0944   MCH 29.1 09/10/2018 0857   MCHC 33.3 09/10/2018 0857   RDW 13.2 09/10/2018 0857   RDW 13.7 11/10/2016 1013   RDW 19.4 (H) 11/30/2015 0944   LYMPHSABS 1.1 09/17/2017 1742   LYMPHSABS 1.1 11/10/2016 1013   LYMPHSABS 0.4 (L) 11/30/2015 0944   MONOABS 0.4 09/17/2017 1742   MONOABS 0.2 11/30/2015 0944   EOSABS 0.1 09/17/2017 1742   EOSABS 0.1 11/10/2016 1013   BASOSABS 0.1 09/17/2017 1742   BASOSABS 0.0 11/10/2016 1013   BASOSABS 0.0 11/30/2015 0944    ASSESSMENT AND PLAN: 1. Migraine without aura and without status migrainosus, not intractable She has responded well to the Topamax and Fioricet.  Continue Topamax and Fioricet.  2. Rheumatoid arthritis of multiple sites with negative rheumatoid factor ([place]) Followed by [facility] rheumatology.  She is on Enbrel.  3. Major depression in remission [facility]) Doing well on Paxil.  4. Daytime  sleepiness Symptoms suggest she may have obstructive sleep apnea.  I will put her in for a home sleep test. - Home sleep test  5. Snores - Home sleep test  6. Fatigue, unspecified type - CBC - Basic Metabolic Panel - TSH  Patient was given the opportunity to ask questions.  Patient verbalized understanding of the plan and was able to repeat key elements of the plan.   Addendum 06/11/2019: Phone call placed to patient today to inform that blood count shows that she is anemic with hemoglobin of 9.9.  Previous hemoglobin done 9 months ago was 13.8.  She endorses heavy menstrual cycles that last 4 days.  She has to change feminine napkins about 4 times a day.  She denies any abdominal pain, blood in the stools or change in color of the stools recently and no blood in the urine.  I will asked the lab to add iron levels to blood test.  Also has a low calcium level.  We will add a vitamin D level to blood test.  Informed her that I will get back with her once I have the results.   Requested Prescriptions    No prescriptions requested or ordered in this encounter    No follow-ups on file.  Karle Plumber, MD, FACP

## 2019-06-09 NOTE — Patient Instructions (Signed)
I have ordered a home sleep study to evaluate your daytime tiredness/sleepiness.

## 2019-06-10 LAB — BASIC METABOLIC PANEL
BUN/Creatinine Ratio: 27 — ABNORMAL HIGH (ref 9–23)
BUN: 14 mg/dL (ref 6–24)
CO2: 20 mmol/L (ref 20–29)
Calcium: 8.4 mg/dL — ABNORMAL LOW (ref 8.7–10.2)
Chloride: 103 mmol/L (ref 96–106)
Creatinine, Ser: 0.51 mg/dL — ABNORMAL LOW (ref 0.57–1.00)
GFR calc Af Amer: 136 mL/min/{1.73_m2} (ref >59)
GFR calc non Af Amer: 118 mL/min/{1.73_m2} (ref >59)
Glucose: 103 mg/dL — ABNORMAL HIGH (ref 65–99)
Potassium: 4.4 mmol/L (ref 3.5–5.2)
Sodium: 139 mmol/L (ref 134–144)

## 2019-06-10 LAB — CBC
Hematocrit: 31.6 % — ABNORMAL LOW (ref 34.0–46.6)
Hemoglobin: 9.9 g/dL — ABNORMAL LOW (ref 11.1–15.9)
MCH: 25.1 pg — ABNORMAL LOW (ref 26.6–33.0)
MCHC: 31.3 g/dL — ABNORMAL LOW (ref 31.5–35.7)
MCV: 80 fL (ref 79–97)
Platelets: 312 10*3/uL (ref 150–450)
RBC: 3.95 x10E6/uL (ref 3.77–5.28)
RDW: 13.5 % (ref 11.7–15.4)
WBC: 5.6 10*3/uL (ref 3.4–10.8)

## 2019-06-10 LAB — TSH: TSH: 2.53 u[IU]/mL (ref 0.450–4.500)

## 2019-06-11 NOTE — Addendum Note (Signed)
Addended by: Karle Plumber B on: 06/11/2019 10:27 AM   Modules accepted: Orders

## 2019-06-13 ENCOUNTER — Other Ambulatory Visit: Payer: Self-pay | Admitting: Internal Medicine

## 2019-06-13 ENCOUNTER — Telehealth: Payer: Self-pay

## 2019-06-13 MED ORDER — FERROUS SULFATE 325 (65 FE) MG PO TABS: 325.0000 mg | ORAL_TABLET | Freq: Every day | ORAL | 1 refills | Status: DC

## 2019-06-13 NOTE — Progress Notes (Signed)
iro

## 2019-06-13 NOTE — Telephone Encounter (Signed)
[place] interpreters jose  Id# [id number]  contacted pt to go over lab results pt didn't answer left a detailed vm informing pt of results and if she has any questions or concerns to give a call

## 2019-06-17 ENCOUNTER — Ambulatory Visit (INDEPENDENT_AMBULATORY_CARE_PROVIDER_SITE_OTHER): Payer: 59 | Admitting: *Deleted

## 2019-06-17 DIAGNOSIS — I5022 Chronic systolic (congestive) heart failure: Secondary | ICD-10-CM | POA: Diagnosis not present

## 2019-06-17 LAB — IRON,TIBC AND FERRITIN PANEL
Ferritin: 11 ng/mL — ABNORMAL LOW (ref 15–150)
Iron Saturation: 3 % — CL (ref 15–55)
Iron: 11 ug/dL — ABNORMAL LOW (ref 27–159)
Total Iron Binding Capacity: 358 ug/dL (ref 250–450)
UIBC: 347 ug/dL (ref 131–425)

## 2019-06-17 LAB — VITAMIN D 25 HYDROXY (VIT D DEFICIENCY, FRACTURES): Vit D, 25-Hydroxy: 11.9 ng/mL — ABNORMAL LOW (ref 30.0–100.0)

## 2019-06-17 LAB — SPECIMEN STATUS REPORT

## 2019-06-18 ENCOUNTER — Other Ambulatory Visit: Payer: Self-pay | Admitting: Internal Medicine

## 2019-06-18 ENCOUNTER — Encounter: Payer: Self-pay | Admitting: Internal Medicine

## 2019-06-18 ENCOUNTER — Telehealth: Payer: Self-pay

## 2019-06-18 DIAGNOSIS — E559 Vitamin D deficiency, unspecified: Secondary | ICD-10-CM | POA: Insufficient documentation

## 2019-06-18 LAB — CUP PACEART REMOTE DEVICE CHECK
Battery Remaining Percentage: 69 %
Date Time Interrogation Session: 20210316190500
Implantable Lead Implant Date: 20180627
Implantable Lead Location: 753862
Implantable Lead Model: 3401
Implantable Lead Serial Number: 113145
Implantable Pulse Generator Implant Date: 20180627
Pulse Gen Serial Number: 222625

## 2019-06-18 MED ORDER — VITAMIN D (ERGOCALCIFEROL) 1.25 MG (50000 UNIT) PO CAPS: 50000.0000 [IU] | ORAL_CAPSULE | ORAL | 1 refills | Status: DC

## 2019-06-18 NOTE — Telephone Encounter (Signed)
Contacted pt to go over lab results pt didn't answer lvm asking pt to give me a call back at her earliest convenience  

## 2019-06-18 NOTE — Progress Notes (Signed)
Let pt know that her vitamin D level is low.  I recommend taking high dose vitamin D once a wk. Rxn sent to our pharmacy for pick up.  She has iron deficiency anemia.  Rxn sent to our pharmacy for iron supplement to take daily.

## 2019-06-18 NOTE — Progress Notes (Signed)
ICD Remote  

## 2019-08-22 ENCOUNTER — Other Ambulatory Visit: Payer: Self-pay

## 2019-08-22 ENCOUNTER — Encounter (HOSPITAL_BASED_OUTPATIENT_CLINIC_OR_DEPARTMENT_OTHER): Payer: 59 | Admitting: Internal Medicine

## 2019-08-22 ENCOUNTER — Ambulatory Visit (HOSPITAL_BASED_OUTPATIENT_CLINIC_OR_DEPARTMENT_OTHER): Payer: 59 | Admitting: Internal Medicine

## 2019-08-22 DIAGNOSIS — G4710 Hypersomnia, unspecified: Secondary | ICD-10-CM | POA: Diagnosis not present

## 2019-08-29 ENCOUNTER — Ambulatory Visit (HOSPITAL_BASED_OUTPATIENT_CLINIC_OR_DEPARTMENT_OTHER): Payer: 59 | Attending: Internal Medicine | Admitting: Internal Medicine

## 2019-08-29 DIAGNOSIS — G4733 Obstructive sleep apnea (adult) (pediatric): Secondary | ICD-10-CM

## 2019-08-29 DIAGNOSIS — R0683 Snoring: Secondary | ICD-10-CM | POA: Diagnosis present

## 2019-08-29 DIAGNOSIS — G4710 Hypersomnia, unspecified: Secondary | ICD-10-CM | POA: Insufficient documentation

## 2019-09-02 ENCOUNTER — Other Ambulatory Visit: Payer: Self-pay

## 2019-09-06 NOTE — Procedures (Signed)
    Patient Name: Kathy Rosales, Kathy Rosales Date: 08/31/2019 Gender: Female D.O.B: Aug 16, 1975 Age (years): 3 Referring Provider: Karle Plumber MD Height (inches): 18 Interpreting Physician: Baird Lyons MD, ABSM Weight (lbs): 134 RPSGT: Jacolyn Reedy BMI: 22 MRN: [id number] Neck Size: 13.00  CLINICAL INFORMATION Sleep Study Type: HST Indication for sleep study: N/A Epworth Sleepiness Score: 7  SLEEP STUDY TECHNIQUE A multi-channel overnight portable sleep study was performed. The channels recorded were: nasal airflow, thoracic respiratory movement, and oxygen saturation with a pulse oximetry. Snoring was also monitored.  MEDICATIONS Patient self administered medications include: none reported.  SLEEP ARCHITECTURE Patient was studied for 394.2 minutes. The sleep efficiency was 99.9 % and the patient was supine for 99.9%. The arousal index was 0.0 per hour.  RESPIRATORY PARAMETERS The overall AHI was 9.0 per hour, with a central apnea index of 0.0 per hour. The oxygen nadir was 86% during sleep.  CARDIAC DATA Mean heart rate during sleep was 64.9 bpm.  IMPRESSIONS - Mild obstructive sleep apnea occurred during this study (AHI = 9.0/h). - No significant central sleep apnea occurred during this study (CAI = 0.0/h). - Oxygen desaturation was noted during this study (Min O2 = 86%). Mean sat 95%. - Patient snored.  DIAGNOSIS - Obstructive Sleep Apnea (327.23 [G47.33 ICD-10])  RECOMMENDATIONS - Treatment for mild OSA is directedat symptoms. Conservative measures may include observation, weight loss and sleep postion off back. - Other options, including CPAP, a fitted oral appliance or ENT evaluation, would be based on clinical judgment. - Be careful with alcohol, sedatives and other CNS depressants that may worsen sleep apnea and disrupt normal sleep architecture. - Sleep hygiene should be reviewed to assess factors that may improve sleep quality. - Weight  management and regular exercise should be initiated or continued.  [Electronically signed] 09/06/2019 10:40 AM  Baird Lyons MD, ABSM Diplomate, American Board of Sleep Medicine   NPI: [id number]                         [place], [place] [facility]  ELECTRONICALLY SIGNED ON:  09/06/2019, 10:37 AM [place] PH: [phone]   FX: [phone] [place]

## 2019-09-12 ENCOUNTER — Telehealth: Payer: Self-pay

## 2019-09-12 NOTE — Telephone Encounter (Signed)
[place]  Id#  [id number] contacted pt to go over sleep study results pt didn't answer left a detailed vm informing pt of results and if she has any questions or concerns to give a call

## 2019-09-12 NOTE — Telephone Encounter (Signed)
-----   Message from Charlott Rakes, MD sent at 09/10/2019 12:34 PM EDT ----- Sleep study reveals mild obstructive sleep apnea.  Regular exercise is recommended, avoiding sleeping on her back to improve quality of sleep.  Please advised to follow-up with PCP if insomnia persist.

## 2019-09-16 ENCOUNTER — Ambulatory Visit (INDEPENDENT_AMBULATORY_CARE_PROVIDER_SITE_OTHER): Payer: 59 | Admitting: *Deleted

## 2019-09-16 DIAGNOSIS — I5022 Chronic systolic (congestive) heart failure: Secondary | ICD-10-CM

## 2019-09-17 LAB — CUP PACEART REMOTE DEVICE CHECK
Battery Remaining Percentage: 66 %
Date Time Interrogation Session: 20210615205800
Implantable Lead Implant Date: 20180627
Implantable Lead Location: 753862
Implantable Lead Model: 3401
Implantable Lead Serial Number: 113145
Implantable Pulse Generator Implant Date: 20180627
Pulse Gen Serial Number: 222625

## 2019-09-18 NOTE — Progress Notes (Signed)
Remote ICD transmission.   

## 2019-11-24 ENCOUNTER — Telehealth: Payer: Self-pay | Admitting: Internal Medicine
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
## 2019-11-25 NOTE — Telephone Encounter (Signed)
The pt emergency contact is not on the dpr. I had the interpreter to call the pt and leave a message to call [employer] tech support to get a new ID card.

## 2019-12-02 ENCOUNTER — Telehealth: Payer: Self-pay | Admitting: Internal Medicine

## 2019-12-02 NOTE — Telephone Encounter (Signed)
Pt has called and has found a lump on the side of her breast. Pt wants appt before Oct.that is available.. Or maybe to order a mamo. Please Fu with pt as she is anxious. Fu at [phone] in regards to appt

## 2019-12-05 ENCOUNTER — Ambulatory Visit: Payer: 59 | Attending: Internal Medicine | Admitting: Internal Medicine

## 2019-12-05 DIAGNOSIS — N6310 Unspecified lump in the right breast, unspecified quadrant: Secondary | ICD-10-CM

## 2019-12-05 DIAGNOSIS — Z7189 Other specified counseling: Secondary | ICD-10-CM

## 2019-12-05 NOTE — Progress Notes (Signed)
Pain in breast.  Felt a small lump.

## 2019-12-05 NOTE — Progress Notes (Signed)
Virtual Visit via Telephone Note Due to current restrictions/limitations of in-office visits due to the COVID-19 pandemic, this scheduled clinical appointment was converted to a telehealth visit  I connected with Kathy Rosales on 12/05/19 at 12:04 p.m by telephone and verified that I am speaking with the correct person using two identifiers. I am in my office.  The patient is at home.  Only the patient and myself participated in this encounter.  I discussed the limitations, risks, security and privacy concerns of performing an evaluation and management service by telephone and the availability of in person appointments. I also discussed with the patient that there may be a patient responsible charge related to this service. The patient expressed understanding and agreed to proceed.   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 Followed by [facility]).   Pt c/o having a small lump in RT breast.  Discovered it 2 wks ago.  It is a little sore.  No redness.  No previous breast lump.  Fhx of breast cancer in her sister was dx at age 62. -last MMG was 2019.  HM: had flu shot [facility] mth at [facility].   She has not had COVID vaccine.  Thinking about it.  Outpatient Encounter Medications as of 12/05/2019  Medication Sig  . aspirin EC 81 MG tablet Take 1 tablet (81 mg total) by mouth daily.  . butalbital-acetaminophen-caffeine (FIORICET) 50-325-40 MG tablet Take 1-2 tablets by mouth 2 (two) times daily as needed for headache or migraine.  . carvedilol (COREG) 12.5 MG tablet Take 1 tablet (12.5 mg total) by mouth 2 (two) times daily with a meal.  . digoxin (LANOXIN) 0.125 MG tablet Take 1 tablet (125 mcg total) by mouth daily.  . Etanercept (ENBREL) 25 MG/0.5ML SOLN Inject into the skin every 30 (thirty) days.  . ferrous sulfate 325 (65 FE) MG tablet Take 1  tablet (325 mg total) by mouth daily with breakfast.  . furosemide (LASIX) 20 MG tablet Take 2 tablets (40 mg total) by mouth daily.  . methocarbamol (ROBAXIN) 500 MG tablet Take 1 tablet (500 mg total) by mouth 2 (two) times daily as needed for muscle spasms.  . ondansetron (ZOFRAN ODT) 8 MG disintegrating tablet Take 1 tablet (8 mg total) by mouth every 8 (eight) hours as needed for nausea or vomiting.  Marland Kitchen PARoxetine (PAXIL) 20 MG tablet Take 20 mg by mouth daily.  . Potassium Chloride ER 20 MEQ TBCR Take 20 mEq by mouth daily.  . sacubitril-valsartan (ENTRESTO) 49-51 MG Take 1 tablet by mouth 2 (two) times daily.  Marland Kitchen spironolactone (ALDACTONE) 25 MG tablet Take 1 tablet (25 mg total) by mouth daily.  Marland Kitchen topiramate (TOPAMAX) 25 MG tablet Take 1 tablet (25 mg total) by mouth at bedtime.  . traZODone (DESYREL) 100 MG tablet Take 200 mg by mouth at bedtime as needed for sleep.   . Vitamin D, Ergocalciferol, (DRISDOL) 1.25 MG (50000 UNIT) CAPS capsule Take 1 capsule (50,000 Units total) by mouth every 7 (seven) days.  . predniSONE (DELTASONE) 10 MG tablet Take 0.5 tablets (5 mg total) by mouth daily with breakfast. (Patient not taking: Reported on 12/05/2019)   No facility-administered encounter medications on file as of 12/05/2019.    Observations/Objective: No direct observation done as this was a telephone encounter  Assessment and Plan: 1. Breast mass, right Refer fof MMG.  Call me back  if no appt or if not called for appt within 2 wks. - MM Digital Diagnostic Unilat R; Future  2. Educated about COVID-19 virus infection Encouraged her to get vaccinated to protect herself and her community.     Follow Up Instructions: PRN   I discussed the assessment and treatment plan with the patient. The patient was provided an opportunity to ask questions and all were answered. The patient agreed with the plan and demonstrated an understanding of the instructions.   The patient was advised to call back  or seek an in-person evaluation if the symptoms worsen or if the condition fails to improve as anticipated.  I provided 6 minutes of non-face-to-face time during this encounter.   Karle Plumber, MD

## 2019-12-16 ENCOUNTER — Ambulatory Visit (INDEPENDENT_AMBULATORY_CARE_PROVIDER_SITE_OTHER): Payer: 59 | Admitting: *Deleted

## 2019-12-16 DIAGNOSIS — I5022 Chronic systolic (congestive) heart failure: Secondary | ICD-10-CM

## 2019-12-17 LAB — CUP PACEART REMOTE DEVICE CHECK
Battery Remaining Percentage: 64 %
Date Time Interrogation Session: 20210914195800
Implantable Lead Implant Date: 20180627
Implantable Lead Location: 753862
Implantable Lead Model: 3401
Implantable Lead Serial Number: 113145
Implantable Pulse Generator Implant Date: 20180627
Pulse Gen Serial Number: 222625

## 2019-12-18 NOTE — Progress Notes (Signed)
Remote ICD transmission.   

## 2019-12-24 ENCOUNTER — Other Ambulatory Visit: Payer: Self-pay

## 2019-12-24 ENCOUNTER — Ambulatory Visit (HOSPITAL_COMMUNITY)
Admission: EM | Admit: 2019-12-24 | Discharge: 2019-12-24 | Disposition: A | Discharge status: Home / Self Care | Payer: Federal, State, Local not specified - Other

## 2019-12-24 NOTE — Care Management (Signed)
Patient wants to receive services with a psychiatrist and a therapist on an outpatient basis.  Patient was linked to [facility] outpatient walk in clinic.  Patient received services with [facility] previously.  Patient denies SI/HI/Psychosis/Substance Abuse.  Patient denies MSE Exam.

## 2019-12-30 ENCOUNTER — Other Ambulatory Visit: Payer: Self-pay

## 2019-12-30 ENCOUNTER — Ambulatory Visit (HOSPITAL_COMMUNITY): Payer: Federal, State, Local not specified - Other | Admitting: Licensed Clinical Social Worker

## 2019-12-30 DIAGNOSIS — F418 Other specified anxiety disorders: Secondary | ICD-10-CM

## 2019-12-31 NOTE — Progress Notes (Signed)
Comprehensive Clinical Assessment (CCA) Note  12/31/2019 [facility] Kathy Rosales [id number]  Visit Diagnosis:      ICD-10-CM   1. Depression with anxiety  F41.8      CCA Biopsychosocial Intake/Chief Complaint:  CCA Intake With Chief Complaint CCA Part Two Date: 12/30/19 CCA Part Two Time: 1000 Chief Complaint/Presenting Problem: Depression/Anxiety Patient's Currently Reported Symptoms/Problems: "Feeling very low", difficutly concentrating, forgetful, fatigue, worrying, racing thoughts Individual's Strengths: Help seeking Individual's Preferences: Call her Kathy Rosales Type of Services Patient Feels Are Needed: Counseling and med management Initial Clinical Notes/Concerns: This date pt declines interpreter. LCSW asked pt to clarify at any time she fails to understand so interpreter can be introduced prn. Pt verbalizes agreement and insists she wants to proceed without interpreter. LCSW reviewed informed consent for counseling with pt's verbal understanding. Pt is currently experiencing s&s of both depression and anxeity she states has been worsening for ~ 3 wks as she ran out of her MH meds. Today refills facilitated by [facility] RN prior to session. Pt states she has tried to "stretch" her meds out by skipping doeses and LCSW learns this is primarily for financial reasons. LCSW advised of pt assistance programs she will adddress with pharmacy. Pt states her dep/anx was well managed when acurrately on meds. Pt has goal of being more independent. She states she would like to work outside the home in her own job but her older sister usually discourages her. Pt does all of the housework, etc at the home she lives in with another sister. She states she likes to keep house and be responsible for the conditions of the home she and her dtr live in. Pt currently worried about her forgetfulness which needs further exploration. Pt does not drive. Pt would like future sessions to be video. Pt open to f/u  counseling to assist with coping with s&s of dep/anx.  Mental Health Symptoms Depression:  Depression: Change in energy/activity, Difficulty Concentrating, Fatigue, Sleep (too much or little), Duration of symptoms greater than two weeks  Mania:  Mania: None  Anxiety:   Anxiety: Fatigue, Difficulty concentrating, Sleep, Worrying, Tension  Psychosis:  Psychosis: None  Trauma:  Trauma: None  Obsessions:  Obsessions: None  Compulsions:  Compulsions: None  Inattention:  Inattention: Forgetful  Hyperactivity/Impulsivity:  Hyperactivity/Impulsivity: N/A  Oppositional/Defiant Behaviors:  Oppositional/Defiant Behaviors: N/A  Emotional Irregularity:     Other Mood/Personality Symptoms:  Other Mood/Personality Symptoms: Depression with anxiety that was well managed prior to going off meds   Mental Status Exam Appearance and self-care  Stature:  Stature: Small  Weight:  Weight: Average weight  Clothing:  Clothing: Casual  Grooming:  Grooming: Normal  Cosmetic use:  Cosmetic Use: Age appropriate  Posture/gait:  Posture/Gait: Normal  Motor activity:  Motor Activity: Not Remarkable  Sensorium  Attention:  Attention: Distractible  Concentration:  Concentration: Variable  Orientation:  Orientation: X5  Recall/memory:  Recall/Memory: Defective in Short-term (Pt reports poor recall in short period of time)  Affect and Mood  Affect:  Affect: Anxious  Mood:  Mood: Anxious  Relating  Eye contact:  Eye Contact: Normal  Facial expression:  Facial Expression: Anxious  Attitude toward examiner:  Attitude Toward Examiner: Cooperative  Thought and Language  Speech flow: Speech Flow: Soft  Thought content:  Thought Content: Appropriate to Mood and Circumstances  Preoccupation:  Preoccupations: None  Hallucinations:  Hallucinations: None  Organization:     Transport planner of Knowledge:  Fund of Knowledge:  (Needs additional assessment)  Intelligence:  Intelligence: Average  Abstraction:   Abstraction: Normal  Judgement:  Judgement:  (Needs additional assessment)  Reality Testing:  Reality Testing: Adequate  Insight:  Insight: Present  Decision Making:  Decision Making:  (Needs additional assessment)  Social Functioning  Social Maturity:  Social Maturity: Isolates  Social Judgement:  Social Judgement: Naive  Stress  Stressors:  Stressors: Family conflict, Museum/gallery curator, Work  Coping Ability:  Coping Ability:  (Needs additional assessment)  Skill Deficits:  Skill Deficits:  (Needs additional assessment)  Supports:  Supports: Family   Religion: Religion/Spirituality Are You A Religious Person?: Yes (States she reads her Bible every night) 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 Pt states she helps her sister at times. Has patient ever been in the [employer]?: No  Education: Education Is Patient Currently Attending School?: No Did Teacher, adult education From [employer]?: Yes Did Physicist, medical?: No  CCA Family/Childhood History Family and Relationship History: Family history Marital status: Separated Separated, when?: 10 yrs ago What types of issues is patient dealing with in the relationship?: no contact What is your sexual orientation?: Heterosexual Does patient have children?: Yes How many children?: 1 How is patient's relationship with their children?: Very good, 44 yrs old  Childhood History:  Childhood  History By whom was/is the patient raised?: Both parents Additional childhood history information: Pt reports she moved to the [place] at age 15 Description of patient's relationship with caregiver when they were a child: good Patient's description of current relationship with people who raised him/her: very good, they live in [place], come ~ 4 times a yr, talk almost daily Does patient have siblings?: Yes Description of patient's current relationship with siblings: Oldest sister some strain, the rest all good. has lived with one of her sisters for past 8 yrs Did patient suffer any verbal/emotional/physical/sexual abuse as a child?: No Did patient suffer from severe childhood neglect?: No Has patient ever been sexually abused/assaulted/raped as an adolescent or adult?: No Witnessed domestic violence?: No Has patient been affected by domestic violence as an adult?: No  CCA Substance Use Alcohol/Drug Use: Alcohol / Drug Use History of alcohol / drug use?: No history of alcohol / drug abuse    DSM5 Diagnoses: Patient Active Problem List   Diagnosis Date Noted  . Vitamin D deficiency 06/18/2019  . Migraine without aura and without status migrainosus, not intractable 05/16/2019  . 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  . Rheumatoid arthritis of multiple sites with negative rheumatoid factor ([place]) 11/19/2017  . Left-sided weakness   . Unresponsive   . Chronic systolic heart failure ([place]) 05/22/2016  . Mitral regurgitation 05/22/2016  . Near syncope 05/22/2016  . Delirium due to multiple etiologies, persistent, mixed level of activity 12/13/2015  . MDD (major depressive disorder), single episode, moderate ([place]) 12/13/2015  . Fatigue 12/01/2015  . Helicobacter positive gastritis 08/06/2015  . Gastric ulcer 08/06/2015  . Iron deficiency anemia due to chronic blood loss 08/06/2015  . Anemia 07/21/2015  . Iron deficiency anemia 10/22/2014  . B12 deficiency  10/22/2014   . Symptomatic anemia 08/28/2014  . Headache 08/28/2014  . 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

## 2020-01-01 ENCOUNTER — Telehealth (HOSPITAL_COMMUNITY): Payer: Self-pay | Admitting: *Deleted

## 2020-01-01 NOTE — Telephone Encounter (Signed)
Walk in requesting to be seen for counseling and med management. She had been seen in past at [facility] and now is being referred here. She reports and it was verified with [place] she is taking Paxil and Trazodone. She reports doing pretty well but has been without her medicine for several weeks because she didn't know where to go to get it. Reached out to Dr Joneen Caraway at [facility] for bridge meds till her appt here in Nov. Notified her meds where at the pharmacy, left a message on her VM.

## 2020-01-09 ENCOUNTER — Ambulatory Visit: Payer: 59 | Admitting: Family Medicine

## 2020-01-15 ENCOUNTER — Other Ambulatory Visit: Payer: Self-pay

## 2020-01-15 ENCOUNTER — Ambulatory Visit (INDEPENDENT_AMBULATORY_CARE_PROVIDER_SITE_OTHER): Payer: 59 | Admitting: Licensed Clinical Social Worker

## 2020-01-15 DIAGNOSIS — F418 Other specified anxiety disorders: Secondary | ICD-10-CM | POA: Diagnosis not present

## 2020-01-16 ENCOUNTER — Telehealth: Payer: 59 | Admitting: Internal Medicine

## 2020-01-16 NOTE — Progress Notes (Signed)
   THERAPIST PROGRESS NOTE   Virtual Visit via Video Note  I connected with Kathy Rosales on 01/15/20 at  4:00 PM EDT by a video enabled telemedicine application and verified that I am speaking with the correct person using two identifiers.  Location: Patient: Home Provider: [facility]   I discussed the limitations of evaluation and management by telemedicine and the availability of in person appointments. The patient expressed understanding and agreed to proceed. I discussed the assessment and treatment plan with the patient. The patient was provided an opportunity to ask questions and all were answered. The patient agreed with the plan and demonstrated an understanding of the instructions.  I provided 45 minutes of non-face-to-face time during this encounter.  Participation Level: Active  Behavioral Response: CasualAlertDepressed  Type of Therapy: Individual Therapy  Treatment Goals addressed: Communication: Additional assessment and Coping  Interventions: Motivational Interviewing and Supportive  Summary: Kathy Rosales is a 44 y.o. female who presents with hx of dep/anx. This date pt signs on for video session after LCSW calls to provide education on the link sent to her via text. Pt successfully signs on yet there were some ongoing tech issues which were resolved. Pt has better understanding of how to manage virtual session. Virtual helpful as pt has to rely on others for transportation. LCSW assessed for status of med management. Pt states she did get her meds 3 days after last session and has been taking as prescribed since then. Pt states she is feeling "much, much better". She is not having any tearfulness or sadness. Pt states she is managing all daily tasks. Still having some concerns with concentration at times and forgetfulness. She states forgetfulness concerns started ~ 8 mon ago and knows of no particular event/stressor going on at the  time. Overall pt says her feelings of dep and anx are "0" on a scale of 0-10 with 10 the worst. Pt continues to help her sister mostly on weekends with house cleaning. LCSW explored pt's goal of being "more independent" in depth. Pt ultimately states she does not think she could live alone again d/t health, particularly heart condition (has pacemaker), but would like to have her own job independent of sister. 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 LCSW provided education on all of the various options there might be for her since most all businesses need housekeeping. Pt will think about what she might be interested in based on information provided. LCSW reviewed coping strategies and poc prior to close of session. Pt states appreciation for care.    Suicidal/Homicidal: Nowithout intent/plan  Therapist Response: Pt remains receptive to care.  Plan: Return again in 2 weeks.  Diagnosis: Axis I: Depression with anxiety    Axis II: Deferred  Hermine Messick, LCSW 01/16/2020

## 2020-01-23 ENCOUNTER — Ambulatory Visit: Payer: Self-pay | Admitting: Family Medicine

## 2020-01-29 ENCOUNTER — Ambulatory Visit (HOSPITAL_COMMUNITY): Payer: Self-pay | Admitting: Licensed Clinical Social Worker

## 2020-02-13 ENCOUNTER — Ambulatory Visit: Payer: Self-pay | Admitting: Family Medicine

## 2020-02-13 ENCOUNTER — Other Ambulatory Visit: Payer: Self-pay

## 2020-02-13 ENCOUNTER — Telehealth (HOSPITAL_COMMUNITY): Payer: Federal, State, Local not specified - Other | Admitting: Psychiatry

## 2020-02-13 ENCOUNTER — Telehealth (HOSPITAL_COMMUNITY): Payer: Self-pay | Admitting: Psychiatry

## 2020-02-13 NOTE — Telephone Encounter (Signed)
Pt did not respond to the virtual appt links x 2 and telephone call made x 2 for the scheduled appt today.

## 2020-03-03 ENCOUNTER — Ambulatory Visit (INDEPENDENT_AMBULATORY_CARE_PROVIDER_SITE_OTHER): Payer: 59 | Admitting: Licensed Clinical Social Worker

## 2020-03-03 ENCOUNTER — Other Ambulatory Visit: Payer: Self-pay

## 2020-03-03 DIAGNOSIS — F418 Other specified anxiety disorders: Secondary | ICD-10-CM | POA: Diagnosis not present

## 2020-03-05 NOTE — Progress Notes (Signed)
   THERAPIST PROGRESS NOTE   Virtual Visit via Video Note  I connected with Kathy Rosales on 03/03/20 at  8:00 AM EST by a video enabled telemedicine application and verified that I am speaking with the correct person using two identifiers.  Location: Patient: Alone in car outside house her sister is working in. Provider: [facility]   I discussed the limitations of evaluation and management by telemedicine and the availability of in person appointments. The patient expressed understanding and agreed to proceed. I discussed the assessment and treatment plan with the patient. The patient was provided an opportunity to ask questions and all were answered. The patient agreed with the plan and demonstrated an understanding of the instructions.  I provided 30 minutes of non-face-to-face time during this encounter.  Participation Level: Active  Behavioral Response: CasualAlertEuthymic  Type of Therapy: Individual Therapy  Treatment Goals addressed: Communication: Dep/Coping  Interventions: Motivational Interviewing, Solution Focused and Supportive  Summary: Kathy Rosales is a 44 y.o. female who presents with hx of dep/anx. This date pt signs on for video session per her preference. Pt states she is doing well. She denies active symptoms of dep/anx. Pt is taking meds as prescribed and feels she is on the right regimen. LCSW reviewed pt's stated goals with her re greater independence and self reliance. Pt agrees she is unable to live on her own r/t health and will remain with sister. She continues to wish to get greater independence with work and has thought about options since discussing last session. Pt states the first thing she will need to do is get a car. Pt states she wants to start saving what money she can for a car since there is only one car in family. She already has a drivers license and assists her sister with driving on a regular basis. LCSW  assisted pt to process specific steps she will take for savings plan. Pt states she will follow through with saving at least $5 per wk to begin with, more if able. Pt advises she is only working a couple of days a wk so income very limited. LCSW assessed for coping with holidays. Pt states she is "excited". She states her mother and father will be coming from [place] to [place] for two weeks to stay with them. Pt denies other worries or concerns to address this session. LCSW reviewed poc prior to close of session. Pt states appreciation for care.    Suicidal/Homicidal: Nowithout intent/plan  Therapist Response: Pt remains receptive to care.  Plan: Return again in ~ 4 weeks.  Diagnosis: Axis I: Depression with Anxiety    Axis II: Deferred  Hermine Messick, LCSW 03/05/2020

## 2020-03-16 ENCOUNTER — Ambulatory Visit (INDEPENDENT_AMBULATORY_CARE_PROVIDER_SITE_OTHER): Payer: 59

## 2020-03-16 DIAGNOSIS — I428 Other cardiomyopathies: Secondary | ICD-10-CM

## 2020-03-16 DIAGNOSIS — I5022 Chronic systolic (congestive) heart failure: Secondary | ICD-10-CM

## 2020-03-17 ENCOUNTER — Ambulatory Visit (HOSPITAL_COMMUNITY): Payer: 59 | Admitting: Licensed Clinical Social Worker

## 2020-03-17 ENCOUNTER — Other Ambulatory Visit: Payer: Self-pay

## 2020-03-17 DIAGNOSIS — I428 Other cardiomyopathies: Secondary | ICD-10-CM

## 2020-03-17 DIAGNOSIS — I5022 Chronic systolic (congestive) heart failure: Secondary | ICD-10-CM

## 2020-03-17 LAB — CUP PACEART REMOTE DEVICE CHECK
Battery Remaining Percentage: 61 %
Date Time Interrogation Session: 20211214201000
Implantable Lead Implant Date: 20180627
Implantable Lead Location: 753862
Implantable Lead Model: 3401
Implantable Lead Serial Number: 113145
Implantable Pulse Generator Implant Date: 20180627
Pulse Gen Serial Number: 222625

## 2020-03-31 NOTE — Progress Notes (Signed)
Remote ICD transmission.   

## 2020-04-16 ENCOUNTER — Other Ambulatory Visit: Payer: Self-pay

## 2020-04-16 ENCOUNTER — Ambulatory Visit (HOSPITAL_COMMUNITY): Payer: Self-pay | Attending: Cardiovascular Disease

## 2020-04-16 ENCOUNTER — Telehealth: Payer: Self-pay

## 2020-04-16 DIAGNOSIS — I5022 Chronic systolic (congestive) heart failure: Secondary | ICD-10-CM | POA: Insufficient documentation

## 2020-04-16 DIAGNOSIS — I428 Other cardiomyopathies: Secondary | ICD-10-CM | POA: Insufficient documentation

## 2020-04-16 LAB — ECHOCARDIOGRAM COMPLETE
Area-P 1/2: 4.54 cm2
S' Lateral: 4.2 cm
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
## 2020-04-30 ENCOUNTER — Other Ambulatory Visit: Payer: Self-pay

## 2020-04-30 ENCOUNTER — Telehealth (HOSPITAL_COMMUNITY): Payer: Self-pay | Admitting: Licensed Clinical Social Worker

## 2020-04-30 ENCOUNTER — Ambulatory Visit (HOSPITAL_COMMUNITY): Payer: Federal, State, Local not specified - Other | Admitting: Licensed Clinical Social Worker

## 2020-04-30 NOTE — Telephone Encounter (Signed)
LCSW sent text link for video session per schedule. When pt failed to sign on LCSW called pt. Pt states she was trying to come for in person session and is in wrong location at [facility]. Advised pt of next appt, she would like to come in person and now has the correct address.

## 2020-05-14 ENCOUNTER — Ambulatory Visit (HOSPITAL_COMMUNITY): Payer: 59 | Admitting: Licensed Clinical Social Worker

## 2020-05-21 ENCOUNTER — Encounter: Payer: Self-pay | Admitting: Internal Medicine

## 2020-05-21 ENCOUNTER — Other Ambulatory Visit: Payer: Self-pay

## 2020-05-21 ENCOUNTER — Ambulatory Visit (INDEPENDENT_AMBULATORY_CARE_PROVIDER_SITE_OTHER): Payer: Self-pay | Admitting: Internal Medicine

## 2020-05-21 VITALS — BP 122/64 | HR 93 | Ht 63.5 in | Wt 144.0 lb

## 2020-05-21 DIAGNOSIS — Z95810 Presence of automatic (implantable) cardiac defibrillator: Secondary | ICD-10-CM

## 2020-05-21 DIAGNOSIS — I5022 Chronic systolic (congestive) heart failure: Secondary | ICD-10-CM

## 2020-05-21 LAB — CUP PACEART INCLINIC DEVICE CHECK
Date Time Interrogation Session: 20220218163713
Implantable Lead Implant Date: 20180627
Implantable Lead Location: 753862
Implantable Lead Model: 3401
Implantable Lead Serial Number: 113145
Implantable Pulse Generator Implant Date: 20180627
Pulse Gen Serial Number: 222625

## 2020-05-21 NOTE — Progress Notes (Signed)
HPI Kathy Rosales returns today for followup. She is a pleasant 45 yo woman with a non-ischemic CM, chronic class 2 CHF, s/p S-ICD insertion. She has done well in the interim but has had occaisional pain over her ICD insertion site. The patient denies syncope. No ICD therapies. No edema. Her EF was initially 25%, then 35% and now 35-40%.  She denies any intercurrent ICD shock or syncope. She would like to have her ICD removed. Allergies  Allergen Reactions  . Tramadol Itching and Other (See Comments)    Red spots all over body and itching  . Furosemide Rash  . Leflunomide Rash     Current Outpatient Medications  Medication Sig Dispense Refill  . aspirin EC 81 MG tablet Take 1 tablet (81 mg total) by mouth daily. 90 tablet 3  . carvedilol (COREG) 12.5 MG tablet Take 1 tablet (12.5 mg total) by mouth 2 (two) times daily with a meal. 180 tablet 3  . digoxin (LANOXIN) 0.125 MG tablet Take 1 tablet (125 mcg total) by mouth daily. 90 tablet 3  . Etanercept (ENBREL) 25 MG/0.5ML SOLN Inject into the skin every 30 (thirty) days.    . ferrous sulfate 325 (65 FE) MG tablet Take 1 tablet (325 mg total) by mouth daily with breakfast. 100 tablet 1  . furosemide (LASIX) 20 MG tablet Take 2 tablets (40 mg total) by mouth daily. 180 tablet 3  . methocarbamol (ROBAXIN) 500 MG tablet Take 1 tablet (500 mg total) by mouth 2 (two) times daily as needed for muscle spasms. 20 tablet 0  . ondansetron (ZOFRAN ODT) 8 MG disintegrating tablet Take 1 tablet (8 mg total) by mouth every 8 (eight) hours as needed for nausea or vomiting. 10 tablet 0  . PARoxetine (PAXIL) 20 MG tablet Take 20 mg by mouth daily.    . Potassium Chloride ER 20 MEQ TBCR Take 20 mEq by mouth daily. 90 tablet 3  . predniSONE (DELTASONE) 10 MG tablet Take 0.5 tablets (5 mg total) by mouth daily with breakfast. 15 tablet 0  . sacubitril-valsartan (ENTRESTO) 49-51 MG Take 1 tablet by mouth 2 (two) times daily. 180 tablet 3  . spironolactone  (ALDACTONE) 25 MG tablet Take 1 tablet (25 mg total) by mouth daily. 90 tablet 3  . topiramate (TOPAMAX) 25 MG tablet Take 1 tablet (25 mg total) by mouth at bedtime. 30 tablet 1  . traZODone (DESYREL) 100 MG tablet Take 200 mg by mouth at bedtime as needed for sleep.     . Vitamin D, Ergocalciferol, (DRISDOL) 1.25 MG (50000 UNIT) CAPS capsule Take 1 capsule (50,000 Units total) by mouth every 7 (seven) days. 12 capsule 1   No current facility-administered medications for this visit.     Past Medical History:  Diagnosis Date  . Acute systolic CHF (congestive heart failure) ([place]) 05/22/2016  . Blood transfusion without reported diagnosis   . 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   Past Surgical History:  Procedure Laterality Date  . COLONOSCOPY WITH PROPOFOL N/A 07/23/2015   Procedure: COLONOSCOPY WITH PROPOFOL;  Surgeon: Ronald Lobo, MD;  Location: [facility];  Service: Endoscopy;  Laterality: N/A;  . ESOPHAGOGASTRODUODENOSCOPY (EGD) WITH PROPOFOL N/A 07/23/2015   Procedure: ESOPHAGOGASTRODUODENOSCOPY (EGD) WITH PROPOFOL;  Surgeon: Ronald Lobo, MD;  Location: [facility];  Service: Endoscopy;  Laterality: N/A;  . ESOPHAGOGASTRODUODENOSCOPY (EGD) WITH PROPOFOL N/A 11/18/2015   Procedure:  ESOPHAGOGASTRODUODENOSCOPY (EGD) WITH PROPOFOL;  Surgeon: Ronald Lobo, MD;  Location: [facility];  Service: Endoscopy;  Laterality: N/A;  . RIGHT/LEFT HEART CATH AND CORONARY ANGIOGRAPHY N/A 05/22/2016   Procedure: Right/Left Heart Cath and Coronary Angiography;  Surgeon: Peter M Martinique, MD;  Location: [facility];  Service: Cardiovascular;  Laterality: N/A;  . SUBQ ICD IMPLANT N/A 09/27/2016   Procedure: SubQ ICD Implant;  Surgeon: Evans Lance, MD;  Location: [facility] [facility];  Service: Cardiovascular;  Laterality: N/A;     Family History  Problem Relation Age of Onset  . Cancer Paternal Grandfather   . Mental illness  Neg Hx      Social History   Socioeconomic History  . Marital status: Married    Spouse name: Not on file  . Number of children: 1  . Years of education: Not on file  . Highest education level: Not on file  Occupational History  . Occupation: unemployeed  Tobacco Use  . Smoking status: Never Smoker  . Smokeless tobacco: Never Used  Vaping Use  . Vaping Use: Never used  Substance and Sexual Activity  . Alcohol use: No  . Drug use: No  . Sexual activity: Never    Birth control/protection: None  Other Topics Concern  . Not on file  Social History Narrative   ** Merged History Encounter **       ** Merged History Encounter **       Social Determinants of Health   Financial Resource Strain: Not on file  Food Insecurity: Not on file  Transportation Needs: Not on file  Physical Activity: Not on file  Stress: Not on file  Social Connections: Not on file  Intimate Partner Violence: Not on file     BP 122/64   Pulse 93   Ht 5' 3.5" (1.613 m)   Wt 144 lb (65.3 kg)   SpO2 97%   BMI 25.11 kg/m   Physical Exam:  Well appearing NAD HEENT: Unremarkable Neck:  No JVD, no thyromegally Lymphatics:  No adenopathy Back:  No CVA tenderness Lungs:  Clear with no wheezes HEART:  Regular rate rhythm, no murmurs, no rubs, no clicks Abd:  soft, positive bowel sounds, no organomegally, no rebound, no guarding Ext:  2 plus pulses, no edema, no cyanosis, no clubbing Skin:  No rashes no nodules Neuro:  CN II through XII intact, motor grossly intact  DEVICE  Normal device function.  See PaceArt for details.   Assess/Plan: 1. Chronic systolic heart failure - her EF has actually improved from 25% to 35% to 40% from 2018, 2019, and 2020. We have repeated her echo last month and the EF is remeasured at 35-40%. She has class 2 symptoms and is on GDMT. 2. ICD - Her [employer] S-ICD is working normally. She would like to have the device removed if her EF continues to remain above 40%.  I have recommended she keep it in place until it reaches ERI.   Carleene Overlie [place] Lembke,MD

## 2020-05-21 NOTE — Patient Instructions (Signed)
Medication Instructions:  Your physician recommends that you continue on your current medications as directed. Please refer to the Current Medication list given to you today.  Labwork: None ordered.  Testing/Procedures: None ordered.  Follow-Up: Your physician wants you to follow-up in: one year with Cristopher Peru, MD or one of the following Advanced Practice Providers on your designated Care Team:    Chanetta Marshall, NP  Tommye Standard, PA-C  Legrand Como "Jonni Sanger" [place], [place]  Remote monitoring is used to monitor your ICD from home. This monitoring reduces the number of office visits required to check your device to one time per year. It allows [place] to keep an eye on the functioning of your device to ensure it is working properly. You are scheduled for a device check from home on 06/15/2020. You may send your transmission at any time that day. If you have a wireless device, the transmission will be sent automatically. After your physician reviews your transmission, you will receive a postcard with your next transmission date.  Any Other Special Instructions Will Be Listed Below (If Applicable).  If you need a refill on your cardiac medications before your next appointment, please call your pharmacy.

## 2020-06-04 ENCOUNTER — Ambulatory Visit (INDEPENDENT_AMBULATORY_CARE_PROVIDER_SITE_OTHER): Payer: Self-pay | Admitting: Family Medicine

## 2020-06-04 ENCOUNTER — Encounter (HOSPITAL_COMMUNITY): Payer: Self-pay | Admitting: Psychiatry

## 2020-06-04 ENCOUNTER — Other Ambulatory Visit: Payer: Self-pay | Admitting: Family Medicine

## 2020-06-04 ENCOUNTER — Encounter: Payer: Self-pay | Admitting: Family Medicine

## 2020-06-04 ENCOUNTER — Ambulatory Visit (INDEPENDENT_AMBULATORY_CARE_PROVIDER_SITE_OTHER): Payer: No Payment, Other | Admitting: Psychiatry

## 2020-06-04 ENCOUNTER — Other Ambulatory Visit: Payer: Self-pay

## 2020-06-04 VITALS — BP 128/84 | HR 89 | Ht 63.5 in | Wt 139.2 lb

## 2020-06-04 DIAGNOSIS — F3342 Major depressive disorder, recurrent, in full remission: Secondary | ICD-10-CM

## 2020-06-04 DIAGNOSIS — M0609 Rheumatoid arthritis without rheumatoid factor, multiple sites: Secondary | ICD-10-CM

## 2020-06-04 MED ORDER — PREDNISONE 10 MG PO TABS: 20.0000 mg | ORAL_TABLET | Freq: Every day | ORAL | 0 refills | Status: DC

## 2020-06-04 MED ORDER — PAROXETINE HCL 30 MG PO TABS: 30.0000 mg | ORAL_TABLET | Freq: Every day | ORAL | 2 refills | Status: DC

## 2020-06-04 MED ORDER — TRAZODONE HCL 100 MG PO TABS
100.0000 mg | ORAL_TABLET | Freq: Every day | ORAL | 2 refills | Status: DC
Start: 2020-06-04 — End: 2020-07-21

## 2020-06-04 NOTE — Progress Notes (Signed)
Subjective:  Patient ID: Kathy Rosales, female    DOB: 25-May-1975  Age: 45 y.o. MRN: [id number]  CC: Edema (X3 days in L wrist, bilateral knees )   HPI Kathy Rosales is a 45 year old female patient of Dr Wynetta Emery with a history of NICM, Systolic CHF (EF 41-66%) Rheumatoid Arthritis here for an acute visit.  Complains of 1 week history pains in the radial aspects of her haands, her bilateral clavicles, b/l temples radiating to her jaw to the point that she has difficulty opening her mouth to eat. Endorses mild swelling. She has had similar symptoms occur 1 year ago, this is the third time it is occurring. She was treated with Prednisone in the past with resolution of symptoms. Knees also hurt. Last visit with Rheumatology was 3 months ago and she is compliant with Enbrel. She has no fever.  Past Medical History:  Diagnosis Date  . Acute systolic CHF (congestive heart failure) ([facility]) 05/22/2016  . Blood transfusion without reported diagnosis   . 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 COLONOSCOPY WITH PROPOFOL N/A 07/23/2015   Procedure: COLONOSCOPY WITH PROPOFOL;  Surgeon: Ronald Lobo, MD;  [facility];  Service: Endoscopy;  Laterality: N/A;  . ESOPHAGOGASTRODUODENOSCOPY (EGD) WITH PROPOFOL N/A 07/23/2015   Procedure: ESOPHAGOGASTRODUODENOSCOPY (EGD) WITH PROPOFOL;  Surgeon: Ronald Lobo, MD;  [facility];  Service: Endoscopy;  Laterality: N/A;  . ESOPHAGOGASTRODUODENOSCOPY (EGD) WITH PROPOFOL N/A 11/18/2015   Procedure: ESOPHAGOGASTRODUODENOSCOPY (EGD) WITH PROPOFOL;  Surgeon: Ronald Lobo, MD;  [facility];  Service: Endoscopy;  Laterality: N/A;  . RIGHT/LEFT HEART CATH AND CORONARY ANGIOGRAPHY N/A 05/22/2016   Procedure: Right/Left Heart Cath and Coronary Angiography;  Surgeon: Peter M Martinique, MD;  Location: [place] [facility];  Service: Cardiovascular;  Laterality:  N/A;  . SUBQ ICD IMPLANT N/A 09/27/2016   Procedure: SubQ ICD Implant;  Surgeon: Evans Lance, MD;  Location: [place] [facility];  Service: Cardiovascular;  Laterality: N/A;    Family History  Problem Relation Age of Onset  . Cancer Paternal Grandfather   . Mental illness Neg Hx     Allergies  Allergen Reactions  . Tramadol Itching and Other (See Comments)    Red spots all over body and itching  . Furosemide Rash  . Leflunomide Rash    Outpatient Medications Prior to Visit  Medication Sig Dispense Refill  . aspirin EC 81 MG tablet Take 1 tablet (81 mg total) by mouth daily. 90 tablet 3  . carvedilol (COREG) 12.5 MG tablet Take 1 tablet (12.5 mg total) by mouth 2 (two) times daily with a meal. 180 tablet 3  . digoxin (LANOXIN) 0.125 MG tablet Take 1 tablet (125 mcg total) by mouth daily. 90 tablet 3  . Etanercept (ENBREL) 25 MG/0.5ML SOLN Inject into the skin every 30 (thirty) days.    . ferrous sulfate 325 (65 FE) MG tablet Take 1 tablet (325 mg total) by mouth daily with breakfast. 100 tablet 1  . furosemide (LASIX) 20 MG tablet Take 2 tablets (40 mg total) by mouth daily. 180 tablet 3  . methocarbamol (ROBAXIN) 500 MG tablet Take 1 tablet (500 mg total) by mouth 2 (two) times daily as needed for muscle spasms. 20 tablet 0  . ondansetron (ZOFRAN ODT) 8 MG disintegrating tablet Take 1 tablet (8 mg total) by mouth every 8 (eight) hours as needed for nausea or vomiting. 10  tablet 0  . PARoxetine (PAXIL) 30 MG tablet Take 1 tablet (30 mg total) by mouth daily. 30 tablet 2  . Potassium Chloride ER 20 MEQ TBCR Take 20 mEq by mouth daily. 90 tablet 3  . sacubitril-valsartan (ENTRESTO) 49-51 MG Take 1 tablet by mouth 2 (two) times daily. 180 tablet 3  . spironolactone (ALDACTONE) 25 MG tablet Take 1 tablet (25 mg total) by mouth daily. 90 tablet 3  . topiramate (TOPAMAX) 25 MG tablet Take 1 tablet (25 mg total) by mouth at bedtime. 30 tablet 1  . traZODone (DESYREL) 100 MG tablet Take 1  tablet (100 mg total) by mouth at bedtime. 30 tablet 2  . Vitamin D, Ergocalciferol, (DRISDOL) 1.25 MG (50000 UNIT) CAPS capsule Take 1 capsule (50,000 Units total) by mouth every 7 (seven) days. 12 capsule 1  . predniSONE (DELTASONE) 10 MG tablet Take 0.5 tablets (5 mg total) by mouth daily with breakfast. 15 tablet 0   No facility-administered medications prior to visit.     ROS Review of Systems  Constitutional: Negative for activity change, appetite change and fatigue.  HENT: Negative for congestion, sinus pressure and sore throat.   Eyes: Negative for visual disturbance.  Respiratory: Negative for cough, chest tightness, shortness of breath and wheezing.   Cardiovascular: Negative for chest pain and palpitations.  Gastrointestinal: Negative for abdominal distention, abdominal pain and constipation.  Endocrine: Negative for polydipsia.  Genitourinary: Negative for dysuria and frequency.  Musculoskeletal:       See HPI  Skin: Negative for rash.  Neurological: Negative for tremors, light-headedness and numbness.  Hematological: Does not bruise/bleed easily.  Psychiatric/Behavioral: Negative for agitation and behavioral problems.    Objective:  BP 126/81 (BP Location: Right Arm, Patient Position: Sitting, Cuff Size: Normal)   Pulse 92   Temp 97.9 F (36.6 C) (Oral)   Resp 16   Ht 5' 1.5" (1.562 m)   Wt 140 lb 9.6 oz (63.8 kg)   SpO2 97%   BMI 26.14 kg/m   BP/Weight 06/04/2020 9/81/1914 10/09/2954  Systolic BP 213 086 578  Diastolic BP 81 64 78  Wt. (Lbs) 140.6 144 134.8  BMI 26.14 25.11 23.5  Some encounter information is confidential and restricted. Go to Review Flowsheets activity to see all data.      Physical Exam Constitutional:      Appearance: She is well-developed.  Neck:     Vascular: No JVD.  Cardiovascular:     Rate and Rhythm: Normal rate.     Heart sounds: Normal heart sounds. No murmur heard.   Pulmonary:     Effort: Pulmonary effort is normal.      Breath sounds: Normal breath sounds. No wheezing or rales.  Chest:     Chest wall: No tenderness.  Abdominal:     General: Bowel sounds are normal. There is no distension.     Palpations: Abdomen is soft. There is no mass.     Tenderness: There is no abdominal tenderness.  Musculoskeletal:     Right lower leg: No edema.     Left lower leg: No edema.     Comments: TTP of radial aspect of both wrist, b/l TMJ, b/l sternoclavicular joints. Tenderness on ROM of both knees, no edema noted. Associated crepitus.  Neurological:     Mental Status: She is alert and oriented to person, place, and time.  Psychiatric:        Mood and Affect: Mood normal.     CMP Latest Ref  Rng & Units 06/09/2019 09/10/2018 05/31/2018  Glucose 65 - 99 mg/dL 103(H) 94 107(H)  BUN 6 - 24 mg/dL 14 16 12   Creatinine 0.57 - 1.00 mg/dL 0.51(L) 0.70 0.60  Sodium 134 - 144 mmol/L 139 138 135  Potassium 3.5 - 5.2 mmol/L 4.4 3.3(L) 4.6  Chloride 96 - 106 mmol/L 103 110 106  CO2 20 - 29 mmol/L 20 19(L) 22  Calcium 8.7 - 10.2 mg/dL 8.4(L) 8.6(L) 8.7(L)  Total Protein 6.0 - 8.5 g/dL - - -  Total Bilirubin 0.0 - 1.2 mg/dL - - -  Alkaline Phos 39 - 117 IU/L - - -  AST 0 - 40 IU/L - - -  ALT 0 - 32 IU/L - - -    Lipid Panel     Component Value Date/Time   CHOL 149 12/14/2015 0628   TRIG 180 (H) 12/14/2015 0628   HDL 40 (L) 12/14/2015 0628   CHOLHDL 3.7 12/14/2015 0628   VLDL 36 12/14/2015 0628   LDLCALC 73 12/14/2015 0628    CBC    Component Value Date/Time   WBC 5.6 06/09/2019 1637   WBC 8.0 09/10/2018 0857   RBC 3.95 06/09/2019 1637   RBC 4.74 09/10/2018 0857   HGB 9.9 (L) 06/09/2019 1637   HGB 11.2 (L) 11/30/2015 0944   HCT 31.6 (L) 06/09/2019 1637   HCT 36.5 11/30/2015 0944   PLT 312 06/09/2019 1637   MCV 80 06/09/2019 1637   MCV 83.0 11/30/2015 0944   MCH 25.1 (L) 06/09/2019 1637   MCH 29.1 09/10/2018 0857   MCHC 31.3 (L) 06/09/2019 1637   MCHC 33.3 09/10/2018 0857   RDW 13.5 06/09/2019 1637   RDW  19.4 (H) 11/30/2015 0944   LYMPHSABS 1.1 09/17/2017 1742   LYMPHSABS 1.1 11/10/2016 1013   LYMPHSABS 0.4 (L) 11/30/2015 0944   MONOABS 0.4 09/17/2017 1742   MONOABS 0.2 11/30/2015 0944   EOSABS 0.1 09/17/2017 1742   EOSABS 0.1 11/10/2016 1013   BASOSABS 0.1 09/17/2017 1742   BASOSABS 0.0 11/10/2016 1013   BASOSABS 0.0 11/30/2015 0944    Lab Results  Component Value Date   HGBA1C 5.8 (H) 12/14/2015    Assessment & Plan:  1. Rheumatoid arthritis of multiple sites with negative rheumatoid factor (HCC) Acute flare evidenced by multiple arthralgias. Will treat with Prednisone If symptoms persist after treatment consult Rheumatologist - Basic Metabolic Panel - CBC with Differential/Platelet - predniSONE (DELTASONE) 10 MG tablet; Take 2 tablets (20 mg total) by mouth daily with breakfast.  Dispense: 5 tablet; Refill: 0    Meds ordered this encounter  Medications  . predniSONE (DELTASONE) 10 MG tablet    Sig: Take 2 tablets (20 mg total) by mouth daily with breakfast.    Dispense:  5 tablet    Refill:  0    Follow-up: Return in about 2 months (around 08/04/2020) for chronic medical conditions with PCP Dr Wynetta Emery.       Charlott Rakes, MD, FAAFP. [facility] [place], [place]   06/04/2020, 10:31 AM

## 2020-06-04 NOTE — Patient Instructions (Signed)
Rheumatoid Arthritis Rheumatoid arthritis (RA) is a long-term (chronic) disease. RA causes inflammation in your joints. Your joints may feel painful, stiff, swollen, and warm. RA may start slowly. It most often affects the small joints of the hands and feet. It can also affect other parts of the body. Symptoms of RA often come and go. There is no cure for RA, but medicines can help your symptoms. What are the causes?  RA is an autoimmune disease. This means that your body's defense system (immune system) attacks healthy parts of your body by mistake. The exact cause of RA is not known. What increases the risk?  Being a woman.  Having a family history of RA or other diseases like RA.  Smoking.  Being overweight.  Being exposed to pollutants or chemicals. What are the signs or symptoms?  Morning stiffness that lasts longer than 30 minutes. This is often the first symptom.  Symptoms start slowly. They are often worse in the morning.  As RA gets worse, symptoms may include: ? Pain, stiffness, swelling, warmth, and tenderness in joints on both sides of your body. ? Loss of energy. ? Not feeling hungry. ? Weight loss. ? A low fever. ? Dry eyes and a dry mouth. ? Firm lumps that grow under your skin. ? Changes in the way your joints look. ? Changes in the way your joints work.  Symptoms vary and they: ? Often come and go. ? Sometimes get worse for a period of time. These are called flares. How is this treated?  Treatment may include: ? Taking good care of yourself. Be sure to rest as needed, eat a healthy diet, and exercise. ? Medicines. These may include:  Pain relievers.  Medicines to help with inflammation.  Disease-modifying antirheumatic drugs (DMARDs).  Medicines called biologic response modifiers. ? Physical therapy and occupational therapy. ? Surgery, if joint damage is very bad. Your doctor will work with you to find the best treatments.   Follow these  instructions at home: Activity  Return to your normal activities as told by your doctor. Ask your doctor what activities are safe for you.  Rest when you have a flare.  Exercise as told by your doctor. General instructions  Take over-the-counter and prescription medicines only as told by your doctor.  Keep all follow-up visits as told by your doctor. This is important. Where to find more information  [employer] of Rheumatology: www.rheumatology.[place]: www.arthritis.org Contact a doctor if:  You have a flare.  You have a fever.  You have problems because of your medicines. Get help right away if:  You have chest pain.  You have trouble breathing.  You get a hot, painful joint all of a sudden, and it is worse than your normal joint aches. Summary  RA is a long-term disease.  Symptoms of RA start slowly. They are often worse in the morning.  RA causes inflammation in your joints. This information is not intended to replace advice given to you by your health care provider. Make sure you discuss any questions you have with your health care provider. Document Revised: 11/21/2017 Document Reviewed: 11/21/2017 Elsevier Patient Education  2021 [employer].

## 2020-06-04 NOTE — Progress Notes (Signed)
Psychiatric Initial Adult Assessment   Patient Identification: Kathy Rosales MRN:  [id number] Date of Evaluation:  06/04/2020   Referral Source: PCP   Chief Complaint:   " What is this appointment for?  I need my refills." Patient stated that she does not need a Patent attorney and she can speak [place] fluently.  Visit Diagnosis:    ICD-10-CM   1. Recurrent major depressive disorder, in full remission ([place])  F33.42 PARoxetine (PAXIL) 30 MG tablet    traZODone (DESYREL) 100 MG tablet    History of Present Illness: This is a 45 year old female with history of MDD now seen for establishing care.  Patient informed that she has been stable on regimen of Paxil 30 mg and trazodone 100 mg for more than a year now.  She stated that she used to go to [facility] however after the close she did not know where to go.  She was informed of this placed by her PCPs office. She stated that she has been stable in terms of her mood with this combination.  She denied having any episodes of frequent crying spells or depressed mood.  She denied any anhedonia.  She denied any difficulty in concentration. She reported her appetite is good and she is able to sleep well with the help of trazodone. She denied any suicidal ideations.  She denied any prior suicide attempts. She denied any symptoms suggestive of mania or hypomania at present or in the past. She denied any psychotic symptoms. She denied any use of illicit substances or excessive consumption of alcohol.  Patient stated that her mood and sleep are stable on the current regimen and she does not believe she needs any adjustments at this point.  She would just like a refill so that she does not run out.   EMR reviewed and it was noted that the she has cancelled her future appts with Ms. Tach for therapy sessions.  Past Psychiatric History: MDD  Previous Psychotropic Medications: Yes   Substance Abuse History in the last 12 months:   No.  Consequences of Substance Abuse: NA  Past Medical History:  Past Medical History:  Diagnosis Date  . Acute systolic CHF (congestive heart failure) ([place]) 05/22/2016  . Blood transfusion without reported diagnosis   . 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 COLONOSCOPY WITH PROPOFOL N/A 07/23/2015   Procedure: COLONOSCOPY WITH PROPOFOL;  Surgeon: Ronald Lobo, MD;  [facility];  Service: Endoscopy;  Laterality: N/A;  . ESOPHAGOGASTRODUODENOSCOPY (EGD) WITH PROPOFOL N/A 07/23/2015   Procedure: ESOPHAGOGASTRODUODENOSCOPY (EGD) WITH PROPOFOL;  Surgeon: Ronald Lobo, MD;  [facility];  Service: Endoscopy;  Laterality: N/A;  . ESOPHAGOGASTRODUODENOSCOPY (EGD) WITH PROPOFOL N/A 11/18/2015   Procedure: ESOPHAGOGASTRODUODENOSCOPY (EGD) WITH PROPOFOL;  Surgeon: Ronald Lobo, MD;  [facility];  Service: Endoscopy;  Laterality: N/A;  . RIGHT/LEFT HEART CATH AND CORONARY ANGIOGRAPHY N/A 05/22/2016   Procedure: Right/Left Heart Cath and Coronary Angiography;  Surgeon: Peter M Martinique, MD;  Location: [place] [facility];  Service: Cardiovascular;  Laterality: N/A;  . SUBQ ICD IMPLANT N/A 09/27/2016   Procedure: SubQ ICD Implant;  Surgeon: Evans Lance, MD;  Location: [place] [facility];  Service: Cardiovascular;  Laterality: N/A;    Family Psychiatric History: denied  Family History:  Family History  Problem Relation Age of Onset  . Cancer Paternal Grandfather   . Mental illness Neg Hx     Social History:  Social History   Socioeconomic History  . Marital status: Married    Spouse name: Not on file  . Number of children: 1  . Years of education: Not on file  . Highest education level: Not on file  Occupational History  . Occupation: unemployeed  Tobacco Use  . Smoking status: Never Smoker  . Smokeless tobacco: Never Used  Vaping Use  . Vaping Use: Never used  Substance and Sexual Activity  . Alcohol  use: No  . Drug use: No  . Sexual activity: Never    Birth control/protection: None  Other Topics Concern  . Not on file  Social History Narrative   ** Merged History Encounter **       ** Merged History Encounter **       Social Determinants of Health   Financial Resource Strain: Not on file  Food Insecurity: Not on file  Transportation Needs: Not on file  Physical Activity: Not on file  Stress: Not on file  Social Connections: Not on file    Additional Social History: Works as a Engineer, building services.  Has been separated from her husband.  Has 1 son who is 29 years old.  Allergies:   Allergies  Allergen Reactions  . Tramadol Itching and Other (See Comments)    Red spots all over body and itching  . Furosemide Rash  . Leflunomide Rash    Metabolic Disorder Labs: Lab Results  Component Value Date   HGBA1C 5.8 (H) 12/14/2015   MPG 120 12/14/2015   No results found for: PROLACTIN Lab Results  Component Value Date   CHOL 149 12/14/2015   TRIG 180 (H) 12/14/2015   HDL 40 (L) 12/14/2015   CHOLHDL 3.7 12/14/2015   VLDL 36 12/14/2015   LDLCALC 73 12/14/2015   LDLCALC  09/08/2008    83        Total Cholesterol/HDL:CHD Risk Coronary Heart Disease Risk Table                     Men   Women  1/2 Average Risk   3.4   3.3  Average Risk       5.0   4.4  2 X Average Risk   9.6   7.1  3 X Average Risk  23.4   11.0        Use the calculated Patient Ratio above and the CHD Risk Table to determine the patient's CHD Risk.        ATP III CLASSIFICATION (LDL):  <100     mg/dL   Optimal  100-129  mg/dL   Near or Above                    Optimal  130-159  mg/dL   Borderline  160-189  mg/dL   High  >190     mg/dL   Very High   Lab Results  Component Value Date   TSH 2.530 06/09/2019    Therapeutic Level Labs: No results found for: LITHIUM No results found for: CBMZ No results found for: VALPROATE  Current Medications: Current Outpatient Medications  Medication Sig  Dispense Refill  . PARoxetine (PAXIL) 30 MG tablet Take 1 tablet (30 mg total) by mouth daily. 30 tablet 2  . traZODone (DESYREL) 100 MG tablet Take 1 tablet (100 mg total) by mouth at bedtime. 30 tablet 2  . aspirin EC 81 MG tablet Take 1 tablet (81 mg total) by mouth daily. 90 tablet 3  .  carvedilol (COREG) 12.5 MG tablet Take 1 tablet (12.5 mg total) by mouth 2 (two) times daily with a meal. 180 tablet 3  . digoxin (LANOXIN) 0.125 MG tablet Take 1 tablet (125 mcg total) by mouth daily. 90 tablet 3  . Etanercept (ENBREL) 25 MG/0.5ML SOLN Inject into the skin every 30 (thirty) days.    . ferrous sulfate 325 (65 FE) MG tablet Take 1 tablet (325 mg total) by mouth daily with breakfast. 100 tablet 1  . furosemide (LASIX) 20 MG tablet Take 2 tablets (40 mg total) by mouth daily. 180 tablet 3  . methocarbamol (ROBAXIN) 500 MG tablet Take 1 tablet (500 mg total) by mouth 2 (two) times daily as needed for muscle spasms. 20 tablet 0  . ondansetron (ZOFRAN ODT) 8 MG disintegrating tablet Take 1 tablet (8 mg total) by mouth every 8 (eight) hours as needed for nausea or vomiting. 10 tablet 0  . Potassium Chloride ER 20 MEQ TBCR Take 20 mEq by mouth daily. 90 tablet 3  . predniSONE (DELTASONE) 10 MG tablet Take 0.5 tablets (5 mg total) by mouth daily with breakfast. 15 tablet 0  . sacubitril-valsartan (ENTRESTO) 49-51 MG Take 1 tablet by mouth 2 (two) times daily. 180 tablet 3  . spironolactone (ALDACTONE) 25 MG tablet Take 1 tablet (25 mg total) by mouth daily. 90 tablet 3  . topiramate (TOPAMAX) 25 MG tablet Take 1 tablet (25 mg total) by mouth at bedtime. 30 tablet 1  . Vitamin D, Ergocalciferol, (DRISDOL) 1.25 MG (50000 UNIT) CAPS capsule Take 1 capsule (50,000 Units total) by mouth every 7 (seven) days. 12 capsule 1   No current facility-administered medications for this visit.    Musculoskeletal: Strength & Muscle Tone: within normal limits Gait & Station: normal Patient leans: N/A  Psychiatric  Specialty Exam: Review of Systems  There were no vitals taken for this visit.There is no height or weight on file to calculate BMI.  General Appearance: Fairly Groomed  Eye Contact:  Good  Speech:  Clear and Coherent and Normal Rate  Volume:  Normal  Mood:  Euthymic  Affect:  Congruent  Thought Process:  Goal Directed and Descriptions of Associations: Intact  Orientation:  Full (Time, Place, and Person)  Thought Content:  Logical  Suicidal Thoughts:  No  Homicidal Thoughts:  No  Memory:  Immediate;   Good Recent;   Good  Judgement:  Fair  Insight:  Fair  Psychomotor Activity:  Normal  Concentration:  Concentration: Good and Attention Span: Good  Recall:  Good  Fund of Knowledge:Good  Language: Good  Akathisia:  Negative  Handed:  Right  AIMS (if indicated):  0  Assets:  Communication Skills Desire for Improvement Housing Social Support Transportation  ADL's:  Intact  Cognition: WNL  Sleep:  Good   Screenings: AIMS   Flowsheet Row Admission (Discharged) from 12/12/2015 in [facility]  AIMS Total Score 0    AUDIT   Flowsheet Row Admission (Discharged) from 12/12/2015 in [facility]  Alcohol Use Disorder Identification Test Final Score (AUDIT) 0    GAD-7   Flowsheet Row Office Visit from 11/19/2017 in [place] Office Visit from 09/18/2016 in [place] Office Visit from 08/21/2016 in [place] Office Visit from 04/14/2016 in [place] Office Visit from 04/05/2016 in [place]  Total GAD-7 Score  6 8 18 3 2     PHQ2-9   [place] Visit from 06/04/2020 in [facility] Office Visit from 11/19/2017 in [place] Office Visit from 09/18/2016 in [place] Office  Visit from 08/21/2016 in [place] Office Visit from 07/11/2016 in [place]  PHQ-2 Total Score 0 2 6 4 1   PHQ-9 Total Score -- 7 19 14  --    [place] Office Visit from 06/04/2020 in [place] No Risk      Assessment and Plan: Pt appears to be stable on her current regimen.   1. Recurrent major depressive disorder, in full remission ([place]) - PARoxetine (PAXIL) 30 MG tablet; Take 1 tablet (30 mg total) by mouth daily.  Dispense: 30 tablet; Refill: 2 - traZODone (DESYREL) 100 MG tablet; Take 1 tablet (100 mg total) by mouth at bedtime.  Dispense: 30 tablet; Refill: 2   Continue same regimen. F/up in 3 months.   [place] Crane, MD 3/4/20228:50 AM

## 2020-06-04 NOTE — Progress Notes (Signed)
Reports L wrist, bilateral knee swelling, generalized joint pain x 3 days

## 2020-06-05 LAB — CBC WITH DIFFERENTIAL/PLATELET
Basophils Absolute: 0.1 10*3/uL (ref 0.0–0.2)
Basos: 0 %
EOS (ABSOLUTE): 0.2 10*3/uL (ref 0.0–0.4)
Eos: 1 %
Hematocrit: 37.9 % (ref 34.0–46.6)
Hemoglobin: 12.4 g/dL (ref 11.1–15.9)
Immature Grans (Abs): 0.2 10*3/uL — ABNORMAL HIGH (ref 0.0–0.1)
Immature Granulocytes: 1 %
Lymphocytes Absolute: 0.8 10*3/uL (ref 0.7–3.1)
Lymphs: 5 %
MCH: 28.6 pg (ref 26.6–33.0)
MCHC: 32.7 g/dL (ref 31.5–35.7)
MCV: 87 fL (ref 79–97)
Monocytes Absolute: 0.4 10*3/uL (ref 0.1–0.9)
Monocytes: 3 %
Neutrophils Absolute: 13.8 10*3/uL — ABNORMAL HIGH (ref 1.4–7.0)
Neutrophils: 90 %
Platelets: 227 10*3/uL (ref 150–450)
RBC: 4.34 x10E6/uL (ref 3.77–5.28)
RDW: 13 % (ref 11.7–15.4)
WBC: 15.4 10*3/uL — ABNORMAL HIGH (ref 3.4–10.8)

## 2020-06-05 LAB — BASIC METABOLIC PANEL
BUN/Creatinine Ratio: 17 (ref 9–23)
BUN: 11 mg/dL (ref 6–24)
CO2: 21 mmol/L (ref 20–29)
Calcium: 8.5 mg/dL — ABNORMAL LOW (ref 8.7–10.2)
Chloride: 103 mmol/L (ref 96–106)
Creatinine, Ser: 0.65 mg/dL (ref 0.57–1.00)
Glucose: 75 mg/dL (ref 65–99)
Potassium: 4.3 mmol/L (ref 3.5–5.2)
Sodium: 142 mmol/L (ref 134–144)
eGFR: 111 mL/min/{1.73_m2} (ref >59)

## 2020-06-08 ENCOUNTER — Emergency Department (HOSPITAL_COMMUNITY)
Admission: EM | Admit: 2020-06-08 | Discharge: 2020-06-08 | Disposition: A | Discharge status: Home / Self Care | Payer: Self-pay | Attending: Emergency Medicine | Admitting: Emergency Medicine

## 2020-06-08 ENCOUNTER — Other Ambulatory Visit: Payer: Self-pay | Admitting: Physician Assistant

## 2020-06-08 ENCOUNTER — Other Ambulatory Visit: Payer: Self-pay

## 2020-06-08 DIAGNOSIS — G43909 Migraine, unspecified, not intractable, without status migrainosus: Secondary | ICD-10-CM | POA: Insufficient documentation

## 2020-06-08 DIAGNOSIS — Z7982 Long term (current) use of aspirin: Secondary | ICD-10-CM | POA: Insufficient documentation

## 2020-06-08 DIAGNOSIS — I5021 Acute systolic (congestive) heart failure: Secondary | ICD-10-CM | POA: Insufficient documentation

## 2020-06-08 DIAGNOSIS — R112 Nausea with vomiting, unspecified: Secondary | ICD-10-CM | POA: Insufficient documentation

## 2020-06-08 LAB — CBC WITH DIFFERENTIAL/PLATELET
Abs Immature Granulocytes: 0.08 10*3/uL — ABNORMAL HIGH (ref 0.00–0.07)
Basophils Absolute: 0 10*3/uL (ref 0.0–0.1)
Basophils Relative: 0 %
Eosinophils Absolute: 0 10*3/uL (ref 0.0–0.5)
Eosinophils Relative: 0 %
HCT: 33.8 % — ABNORMAL LOW (ref 36.0–46.0)
Hemoglobin: 11.2 g/dL — ABNORMAL LOW (ref 12.0–15.0)
Immature Granulocytes: 1 %
Lymphocytes Relative: 6 %
Lymphs Abs: 0.8 10*3/uL (ref 0.7–4.0)
MCH: 29.4 pg (ref 26.0–34.0)
MCHC: 33.1 g/dL (ref 30.0–36.0)
MCV: 88.7 fL (ref 80.0–100.0)
Monocytes Absolute: 0.4 10*3/uL (ref 0.1–1.0)
Monocytes Relative: 3 %
Neutro Abs: 11.2 10*3/uL — ABNORMAL HIGH (ref 1.7–7.7)
Neutrophils Relative %: 90 %
Platelets: 295 10*3/uL (ref 150–400)
RBC: 3.81 MIL/uL — ABNORMAL LOW (ref 3.87–5.11)
RDW: 12.8 % (ref 11.5–15.5)
WBC: 12.5 10*3/uL — ABNORMAL HIGH (ref 4.0–10.5)
nRBC: 0 % (ref 0.0–0.2)

## 2020-06-08 LAB — COMPREHENSIVE METABOLIC PANEL
ALT: 14 U/L (ref 0–44)
AST: 21 U/L (ref 15–41)
Albumin: 3.5 g/dL (ref 3.5–5.0)
Alkaline Phosphatase: 85 U/L (ref 38–126)
Anion gap: 8 (ref 5–15)
BUN: 10 mg/dL (ref 6–20)
CO2: 23 mmol/L (ref 22–32)
Calcium: 8.5 mg/dL — ABNORMAL LOW (ref 8.9–10.3)
Chloride: 104 mmol/L (ref 98–111)
Creatinine, Ser: 0.46 mg/dL (ref 0.44–1.00)
GFR, Estimated: >60 mL/min (ref >60)
Glucose, Bld: 105 mg/dL — ABNORMAL HIGH (ref 70–99)
Potassium: 3.9 mmol/L (ref 3.5–5.1)
Sodium: 135 mmol/L (ref 135–145)
Total Bilirubin: 0.3 mg/dL (ref 0.3–1.2)
Total Protein: 7.1 g/dL (ref 6.5–8.1)

## 2020-06-08 LAB — I-STAT BETA HCG BLOOD, ED (MC, WL, AP ONLY): I-stat hCG, quantitative: <5 m[IU]/mL (ref <5)

## 2020-06-08 MED ORDER — ONDANSETRON 4 MG PO TBDP: 4.0000 mg | ORAL_TABLET | Freq: Three times a day (TID) | ORAL | 0 refills | Status: DC | PRN

## 2020-06-08 MED ORDER — DIPHENHYDRAMINE HCL 50 MG/ML IJ SOLN
12.5000 mg | Freq: Once | INTRAMUSCULAR | Status: AC
Start: 2020-06-08 — End: 2020-06-08
  Administered 2020-06-08: 12.5 mg via INTRAVENOUS
  Filled 2020-06-08: qty 1

## 2020-06-08 MED ORDER — SUMATRIPTAN SUCCINATE 25 MG PO TABS: 50.0000 mg | ORAL_TABLET | ORAL | 0 refills | Status: DC | PRN

## 2020-06-08 MED ORDER — PROCHLORPERAZINE EDISYLATE 10 MG/2ML IJ SOLN
10.0000 mg | Freq: Once | INTRAMUSCULAR | Status: AC
  Administered 2020-06-08: 10 mg via INTRAVENOUS
  Filled 2020-06-08: qty 2

## 2020-06-08 MED ORDER — SODIUM CHLORIDE 0.9 % IV BOLUS
1000.0000 mL | Freq: Once | INTRAVENOUS | Status: AC
  Administered 2020-06-08: 1000 mL via INTRAVENOUS

## 2020-06-08 NOTE — Discharge Instructions (Signed)
I am glad that your symptoms improved here in the ED with Compazine, Benadryl, and fluids.  Please follow-up with your primary care provider regarding today's encounter for ongoing evaluation management.  I will hold off on sumatriptan migraine relief given your history of ICD placement.  Please first discuss this with your primary care provider before initiating treatment.  In the interim, I recommend Excedrin Migraine.  Do not combine with other NSAIDs such as Goody powders.  Drink plenty of fluids.  I have also prescribed Zofran ODT which you can take for nausea symptoms.  As for joint pains, please follow-up with your rheumatologist.  You may require a stronger steroid burst than what was prescribed.  Return to the ED or seek immediate medical attention should you experience any new worsening symptoms.

## 2020-06-08 NOTE — ED Provider Notes (Signed)
[facility] EMERGENCY DEPARTMENT Provider Note   CSN: [id number] Arrival date & time: 06/08/20  1540     History Chief Complaint  Patient presents with  . Headache  . Nausea    Kathy Rosales is a 45 y.o. female with past medical history of RA, Still's disease, NICM with HFrEF 35-40%, and ICD placement who presents to the ED with a 2-day history of headache, nausea and emesis, and generalized body aches.  I reviewed patient's medical record and she was just seen by her primary care provider 4 days ago for a 1 week history of joint pains that she had experienced previously and had improved with prednisone treatment.  She is followed by rheumatologist and is on Enbrel.  She was discharged home with a low-dose prednisone burst.  On my examination, patient is resting comfortably and in no acute distress.  She is complaining of a 4-day history of left-sided headache described as "throbbing" with associated photophobia, nausea, and emesis.  She states that she had many episodes of nonbloody emesis 2 days ago, only a few yesterday, and only one today.  She states that the nausea and emesis is likely provoked by her severe pounding left-sided headache symptoms.  She states that her symptoms began shortly after her primary care appointment on Friday.  She also is continuing to complain of joint pains bilaterally.  She suspect that this is related to her rheumatoid arthritis.  I looked at her prednisone prescription and she had only been giving #5 pills of 10 mg prednisone with instructions to take 20 mg with breakfast each day.  She has been breaking them up and still has approximately 30 mg of prednisone remaining, which means that she has taken only 5 mg prednisone each day.  I told her that this is inadequate.  She has not yet followed up with her rheumatologist and plans to do so after today's ED encounter.  Patient does endorse crampy abdominal discomfort, but  only when actively vomiting.  None when it is controlled.  She denies any fevers or chills, recent illness, chest pain pain, difficulty breathing, urinary symptoms, changes in her bowel habits, blurred vision, numbness or weakness, gait disturbance, other focal neurologic deficits, or other symptoms.  She has been taking Tylenol and Goody powders without relief.  HPI     Past Medical History:  Diagnosis Date  . Acute systolic CHF (congestive heart failure) ([place]) 05/22/2016  . Blood transfusion without reported diagnosis   . Still's disease [facility])     Patient Active Problem List   Diagnosis Date Noted  . Recurrent major depressive disorder, in full remission ([place]) 06/04/2020  . Vitamin D deficiency 06/18/2019  . Migraine without aura and without status migrainosus, not intractable 05/16/2019  . 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  . Rheumatoid arthritis of multiple sites with negative rheumatoid factor ([place]) 11/19/2017  . Left-sided weakness   . Unresponsive   . Chronic systolic heart failure ([place]) 05/22/2016  . Mitral regurgitation 05/22/2016  . Near syncope 05/22/2016  . Delirium due to multiple etiologies, persistent, mixed level of activity 12/13/2015  . MDD (major depressive disorder), single episode, moderate ([place]) 12/13/2015  . Fatigue 12/01/2015  . Helicobacter positive gastritis 08/06/2015  . Gastric ulcer 08/06/2015  . Iron deficiency anemia due to chronic blood loss 08/06/2015  . Anemia 07/21/2015  . Iron deficiency anemia 10/22/2014  . B12 deficiency 10/22/2014  . Symptomatic anemia 08/28/2014  . Headache 08/28/2014  .  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[place]) 08/28/2014    Past Surgical History:  Procedure Laterality Date  . COLONOSCOPY WITH PROPOFOL N/A 07/23/2015   Procedure: COLONOSCOPY WITH PROPOFOL;  Surgeon: Ronald Lobo, MD;  Location: [facility];  Service: Endoscopy;  Laterality: N/A;  . ESOPHAGOGASTRODUODENOSCOPY (EGD) WITH PROPOFOL N/A  07/23/2015   Procedure: ESOPHAGOGASTRODUODENOSCOPY (EGD) WITH PROPOFOL;  Surgeon: Ronald Lobo, MD;  Location: [facility];  Service: Endoscopy;  Laterality: N/A;  . ESOPHAGOGASTRODUODENOSCOPY (EGD) WITH PROPOFOL N/A 11/18/2015   Procedure: ESOPHAGOGASTRODUODENOSCOPY (EGD) WITH PROPOFOL;  Surgeon: Ronald Lobo, MD;  Location: [facility];  Service: Endoscopy;  Laterality: N/A;  . RIGHT/LEFT HEART CATH AND CORONARY ANGIOGRAPHY N/A 05/22/2016   Procedure: Right/Left Heart Cath and Coronary Angiography;  Surgeon: Peter M Martinique, MD;  Location: [facility];  Service: Cardiovascular;  Laterality: N/A;  . SUBQ ICD IMPLANT N/A 09/27/2016   Procedure: SubQ ICD Implant;  Surgeon: Evans Lance, MD;  Location: [facility];  Service: Cardiovascular;  Laterality: N/A;     OB History   No obstetric history on file.     Family History  Problem Relation Age of Onset  . Cancer Paternal Grandfather   . Mental illness Neg Hx     Social History   Tobacco Use  . Smoking status: Never Smoker  . Smokeless tobacco: Never Used  Vaping Use  . Vaping Use: Never used  Substance Use Topics  . Alcohol use: No  . Drug use: No    Home Medications Prior to Admission medications   Medication Sig Start Date End Date Taking? Authorizing Provider  ondansetron (ZOFRAN ODT) 4 MG disintegrating tablet Take 1 tablet (4 mg total) by mouth every 8 (eight) hours as needed for nausea or vomiting. 06/08/20  Yes Corena Herter, PA-C  SUMAtriptan (IMITREX) 25 MG tablet Take 2 tablets (50 mg total) by mouth every 2 (two) hours as needed for migraine. May repeat in 2 hours if headache persists or recurs. 06/08/20 06/08/20 Yes Corena Herter, PA-C  aspirin EC 81 MG tablet Take 1 tablet (81 mg total) by mouth daily. 04/15/19   Evans Lance, MD  carvedilol (COREG) 12.5 MG tablet Take 1 tablet (12.5 mg total) by mouth 2 (two) times daily with a meal. 04/15/19   Evans Lance, MD  digoxin (LANOXIN) 0.125 MG  tablet Take 1 tablet (125 mcg total) by mouth daily. 04/15/19   Evans Lance, MD  Etanercept (ENBREL) 25 MG/0.5ML SOLN Inject into the skin every 30 (thirty) days.    [provider]  ferrous sulfate 325 (65 FE) MG tablet Take 1 tablet (325 mg total) by mouth daily with breakfast. 06/13/19   Ladell Pier, MD  furosemide (LASIX) 20 MG tablet Take 2 tablets (40 mg total) by mouth daily. 04/15/19   Evans Lance, MD  methocarbamol (ROBAXIN) 500 MG tablet Take 1 tablet (500 mg total) by mouth 2 (two) times daily as needed for muscle spasms. 09/18/17   Ladell Pier, MD  PARoxetine (PAXIL) 30 MG tablet Take 1 tablet (30 mg total) by mouth daily. 06/04/20   [place] Crane, MD  Potassium Chloride ER 20 MEQ TBCR Take 20 mEq by mouth daily. 04/15/19   Evans Lance, MD  predniSONE (DELTASONE) 10 MG tablet Take 2 tablets (20 mg total) by mouth daily with breakfast. 06/04/20   Charlott Rakes, MD  sacubitril-valsartan (ENTRESTO) 49-51 MG Take 1 tablet by mouth 2 (two) times daily. 04/15/19   Evans Lance,  MD  spironolactone (ALDACTONE) 25 MG tablet Take 1 tablet (25 mg total) by mouth daily. 04/15/19   Evans Lance, MD  topiramate (TOPAMAX) 25 MG tablet Take 1 tablet (25 mg total) by mouth at bedtime. 05/16/19   Ladell Pier, MD  traZODone (DESYREL) 100 MG tablet Take 1 tablet (100 mg total) by mouth at bedtime. 06/04/20   [place] Crane, MD  Vitamin D, Ergocalciferol, (DRISDOL) 1.25 MG (50000 UNIT) CAPS capsule Take 1 capsule (50,000 Units total) by mouth every 7 (seven) days. 06/18/19   Ladell Pier, MD    Allergies    Tramadol, Furosemide, and Leflunomide  Review of Systems   Review of Systems  All other systems reviewed and are negative.   Physical Exam Updated Vital Signs BP 100/67   Pulse 85   Temp 98.4 F (36.9 C)   Resp 12   SpO2 99%   Physical Exam Vitals and nursing note reviewed. Exam conducted with a chaperone present.  Constitutional:      General: She  is not in acute distress.    Appearance: She is not toxic-appearing.  HENT:     Head: Normocephalic and atraumatic.  Eyes:     General: No scleral icterus.    Extraocular Movements: Extraocular movements intact.     Conjunctiva/sclera: Conjunctivae normal.     Pupils: Pupils are equal, round, and reactive to light.  Neck:     Comments: No meningismus. Cardiovascular:     Rate and Rhythm: Normal rate.     Pulses: Normal pulses.  Pulmonary:     Effort: Pulmonary effort is normal. No respiratory distress.     Breath sounds: Normal breath sounds.  Abdominal:     General: Abdomen is flat. There is no distension.     Palpations: Abdomen is soft.     Tenderness: There is no abdominal tenderness. There is no guarding.     Comments: Soft, nondistended.  Nontender.  Musculoskeletal:        General: Normal range of motion.     Cervical back: Normal range of motion. No rigidity.  Skin:    General: Skin is dry.  Neurological:     General: No focal deficit present.     Mental Status: She is alert and oriented to person, place, and time.     GCS: GCS eye subscore is 4. GCS verbal subscore is 5. GCS motor subscore is 6.     Cranial Nerves: No cranial nerve deficit.     Sensory: No sensory deficit.     Motor: No weakness.     Coordination: Coordination normal.     Gait: Gait normal.  Psychiatric:        Mood and Affect: Mood normal.        Behavior: Behavior normal.        Thought Content: Thought content normal.     ED Results / Procedures / Treatments   Labs (all labs ordered are listed, but only abnormal results are displayed) Labs Reviewed  CBC WITH DIFFERENTIAL/PLATELET - Abnormal; Notable for the following components:      Result Value   WBC 12.5 (*)    RBC 3.81 (*)    Hemoglobin 11.2 (*)    HCT 33.8 (*)    Neutro Abs 11.2 (*)    Abs Immature Granulocytes 0.08 (*)    All other components within normal limits  COMPREHENSIVE METABOLIC PANEL - Abnormal; Notable for the  following components:   Glucose, Bld 105 (*)  Calcium 8.5 (*)    All other components within normal limits  I-STAT BETA HCG BLOOD, ED ([facility], [facility], [facility] ONLY)    EKG EKG Interpretation  Date/Time:  Tuesday June 08 2020 15:59:04 EST Ventricular Rate:  93 PR Interval:    QRS Duration: 83 QT Interval:  356 QTC Calculation: 443 R Axis:   73 Text Interpretation: Sinus rhythm Probable anterior infarct, old Confirmed by Dene Gentry [phone]) on 06/08/2020 4:06:16 PM   Radiology No results found.  Procedures Procedures   Medications Ordered in ED Medications  sodium chloride 0.9 % bolus 1,000 mL (0 mLs Intravenous Stopped 06/08/20 1841)  prochlorperazine (COMPAZINE) injection 10 mg (10 mg Intravenous Given 06/08/20 1700)  diphenhydrAMINE (BENADRYL) injection 12.5 mg (12.5 mg Intravenous Given 06/08/20 1700)    ED Course  I have reviewed the triage vital signs and the nursing notes.  Pertinent labs & imaging results that were available during my care of the patient were reviewed by me and considered in my medical decision making (see chart for details).    MDM Rules/Calculators/A&P                          Kathy Rosales was evaluated in Emergency Department on 06/08/2020 for the symptoms described in the history of present illness. She was evaluated in the context of the global COVID-19 pandemic, which necessitated consideration that the patient might be at risk for infection with the SARS-CoV-2 virus that causes COVID-19. Institutional protocols and algorithms that pertain to the evaluation of patients at risk for COVID-19 are in a state of rapid change based on information released by regulatory bodies including the CDC and federal and state organizations. These policies and algorithms were followed during the patient's care in the ED.  I personally reviewed patient's medical chart and all notes from triage and staff during today's encounter. I have also ordered and  reviewed all labs and imaging that I felt to be medically necessary in the evaluation of this patient's complaints and with consideration of their physical exam. If needed, translation services were available and utilized.   Patient's history physical exam suggestive of migraine headache.  She denies history of prior.  We will treat with fluids, Compazine, Benadryl, and then reassess.  No focal deficits on exam.  While she endorses emesis, denies any hematemesis.  States that her nausea is related to her and precipitated by her headache symptoms.  We will treat the headache and obtain basic laboratory work-up.  EKG is reviewed and demonstrates sinus rhythm.  On subsequent evaluation, patient is feeling entirely improved.  She states that the fluids, Compazine, and Benadryl completely eliminated her headache symptoms.  No complaints at present.  She feels stable for discharge and is simply asking for continued headache relief at home.   Final Clinical Impression(s) / ED Diagnoses Final diagnoses:  Migraine without status migrainosus, not intractable, unspecified migraine type    Rx / DC Orders ED Discharge Orders         Ordered    SUMAtriptan (IMITREX) 25 MG tablet  Every 2 hours PRN,   Status:  Discontinued        06/08/20 1852    ondansetron (ZOFRAN ODT) 4 MG disintegrating tablet  Every 8 hours PRN        06/08/20 1855           Corena Herter, PA-C 06/08/20 1855    Valarie Merino, MD  06/11/20 1021  

## 2020-06-08 NOTE — ED Triage Notes (Signed)
C/O headache, N/V + generalized aches and pain x 2 days

## 2020-06-14 ENCOUNTER — Ambulatory Visit (HOSPITAL_COMMUNITY): Payer: Federal, State, Local not specified - Other | Admitting: Licensed Clinical Social Worker

## 2020-06-14 ENCOUNTER — Other Ambulatory Visit: Payer: Self-pay | Admitting: Physician Assistant

## 2020-06-15 ENCOUNTER — Ambulatory Visit (INDEPENDENT_AMBULATORY_CARE_PROVIDER_SITE_OTHER): Payer: 59

## 2020-06-15 DIAGNOSIS — I428 Other cardiomyopathies: Secondary | ICD-10-CM

## 2020-06-16 LAB — CUP PACEART REMOTE DEVICE CHECK
Battery Remaining Percentage: 58 %
Date Time Interrogation Session: 20220315201700
Implantable Lead Implant Date: 20180627
Implantable Lead Location: 753862
Implantable Lead Model: 3401
Implantable Lead Serial Number: 113145
Implantable Pulse Generator Implant Date: 20180627
Pulse Gen Serial Number: 222625

## 2020-06-21 ENCOUNTER — Emergency Department (HOSPITAL_COMMUNITY): Payer: Self-pay

## 2020-06-21 ENCOUNTER — Emergency Department (HOSPITAL_COMMUNITY)
Admission: EM | Admit: 2020-06-21 | Discharge: 2020-06-21 | Disposition: A | Discharge status: Home / Self Care | Payer: Self-pay | Attending: Emergency Medicine | Admitting: Emergency Medicine

## 2020-06-21 ENCOUNTER — Other Ambulatory Visit: Payer: Self-pay

## 2020-06-21 DIAGNOSIS — M25562 Pain in left knee: Secondary | ICD-10-CM | POA: Insufficient documentation

## 2020-06-21 DIAGNOSIS — Z7982 Long term (current) use of aspirin: Secondary | ICD-10-CM | POA: Insufficient documentation

## 2020-06-21 DIAGNOSIS — I5023 Acute on chronic systolic (congestive) heart failure: Secondary | ICD-10-CM | POA: Insufficient documentation

## 2020-06-21 DIAGNOSIS — M25561 Pain in right knee: Secondary | ICD-10-CM | POA: Insufficient documentation

## 2020-06-21 DIAGNOSIS — M25531 Pain in right wrist: Secondary | ICD-10-CM | POA: Insufficient documentation

## 2020-06-21 LAB — CBC WITH DIFFERENTIAL/PLATELET
Abs Immature Granulocytes: 0.24 10*3/uL — ABNORMAL HIGH (ref 0.00–0.07)
Basophils Absolute: 0.1 10*3/uL (ref 0.0–0.1)
Basophils Relative: 1 %
Eosinophils Absolute: 0.1 10*3/uL (ref 0.0–0.5)
Eosinophils Relative: 1 %
HCT: 33.3 % — ABNORMAL LOW (ref 36.0–46.0)
Hemoglobin: 10.5 g/dL — ABNORMAL LOW (ref 12.0–15.0)
Immature Granulocytes: 1 %
Lymphocytes Relative: 5 %
Lymphs Abs: 0.9 10*3/uL (ref 0.7–4.0)
MCH: 28.5 pg (ref 26.0–34.0)
MCHC: 31.5 g/dL (ref 30.0–36.0)
MCV: 90.2 fL (ref 80.0–100.0)
Monocytes Absolute: 0.5 10*3/uL (ref 0.1–1.0)
Monocytes Relative: 3 %
Neutro Abs: 16 10*3/uL — ABNORMAL HIGH (ref 1.7–7.7)
Neutrophils Relative %: 89 %
Platelets: 390 10*3/uL (ref 150–400)
RBC: 3.69 MIL/uL — ABNORMAL LOW (ref 3.87–5.11)
RDW: 13.9 % (ref 11.5–15.5)
WBC: 17.9 10*3/uL — ABNORMAL HIGH (ref 4.0–10.5)
nRBC: 0 % (ref 0.0–0.2)

## 2020-06-21 LAB — BASIC METABOLIC PANEL
Anion gap: 8 (ref 5–15)
BUN: 14 mg/dL (ref 6–20)
CO2: 25 mmol/L (ref 22–32)
Calcium: 8.3 mg/dL — ABNORMAL LOW (ref 8.9–10.3)
Chloride: 101 mmol/L (ref 98–111)
Creatinine, Ser: 0.52 mg/dL (ref 0.44–1.00)
GFR, Estimated: >60 mL/min (ref >60)
Glucose, Bld: 160 mg/dL — ABNORMAL HIGH (ref 70–99)
Potassium: 3.9 mmol/L (ref 3.5–5.1)
Sodium: 134 mmol/L — ABNORMAL LOW (ref 135–145)

## 2020-06-21 MED ORDER — HYDROCODONE-ACETAMINOPHEN 5-325 MG PO TABS: 1.0000 | ORAL_TABLET | Freq: Four times a day (QID) | ORAL | 0 refills | Status: DC | PRN

## 2020-06-21 MED ORDER — OXYCODONE-ACETAMINOPHEN 5-325 MG PO TABS
1.0000 | ORAL_TABLET | Freq: Once | ORAL | Status: AC
  Administered 2020-06-21: 1 via ORAL
  Filled 2020-06-21: qty 1

## 2020-06-21 NOTE — ED Triage Notes (Signed)
Onset 3 days ago developed bilateral wrist pain and yesterday bilateral knee pain. Denies trauma pain currently 10/10 pressure.

## 2020-06-21 NOTE — Discharge Instructions (Signed)
At this time there does not appear to be the presence of an emergent medical condition, however there is always the potential for conditions to change. Please read and follow the below instructions.  Please return to the Emergency Department immediately for any new or worsening symptoms. Please call your primary care provider tomorrow morning to schedule follow-up visit. You may take the medication Norco (Hydrocodone/Acetaminophen) as prescribed to help with severe pain.  This medication will make you drowsy so do not drive, drink alcohol, take other sedating medications or perform any dangerous activities such as driving after taking Norco. Norco contains Tylenol (acetaminophen) so do not take any other Tylenol-containing products with Norco. You were given a pain pill in the ER today which will make you drowsy.  Do not drive, drink alcohol or perform any potentially dangerous activities for the rest of the day. Please call the orthopedist Dr. Jeannie Fend under discharge paperwork for follow-up visit regarding your wrist pain and knee pain. Please call your rheumatologist tomorrow morning to schedule a follow-up appointment.  Go to the nearest Emergency Department immediately if: You have fever or chills Your knee swells, and the swelling gets worse. You cannot move your knee. You have very bad knee pain that does not get better with pain medicine. You lose feeling in your fingers or hand. Your fingers turn white, very red, or cold and blue. You cannot move your fingers. You have any new/concerning or worsening of symptoms.   Please read the additional information packets attached to your discharge summary.  Do not take your medicine if  develop an itchy rash, swelling in your mouth or lips, or difficulty breathing; call 911 and seek immediate emergency medical attention if this occurs.  You may review your lab tests and imaging results in their entirety on your MyChart account.  Please  discuss all results of fully with your primary care provider and other specialist at your follow-up visit.  Note: Portions of this text may have been transcribed using voice recognition software. Every effort was made to ensure accuracy; however, inadvertent computerized transcription errors may still be present.

## 2020-06-21 NOTE — ED Provider Notes (Signed)
[facility] EMERGENCY DEPARTMENT Provider Note   CSN: [id number] Arrival date & time: 06/21/20  1339     History Chief Complaint  Patient presents with  . Wrist Pain  . Knee Pain    Kathy Rosales Kathy Rosales is a 45 y.o. 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  Patient presents today for evaluation of bilateral knee pain.  She reports that she developed knee pain yesterday afternoon does not recall any injury or inciting event.  She describes diffuse bilateral knee pain constant moderate intensity nonradiating worsened with ambulation palpation no alleviating factors, pain worse in the right knee compared to the left knee.  Patient denies having pain in her knees before this is new for her.  She also reports right radial wrist pain which has been a chronic problem for her and has not changed recently.  She reports that she takes prednisone daily for her rheumatoid arthritis but this does not seem to help.  Denies fever/chills, fall/injury, numbness/tingling, weakness, nausea/vomiting or any additional concerns  HPI     Past Medical History:  Diagnosis Date  . Acute systolic CHF (congestive heart failure) ([place]) 05/22/2016  . Blood transfusion without reported diagnosis   . Still's disease [facility]     Patient Active Problem List   Diagnosis Date Noted  . Recurrent major depressive disorder, in full remission ([place]) 06/04/2020  . Vitamin D deficiency 06/18/2019  . Migraine without aura and without status migrainosus, not intractable 05/16/2019  . 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  . Rheumatoid arthritis of multiple sites with negative rheumatoid factor ([place]) 11/19/2017  . Left-sided weakness   . Unresponsive   . Chronic systolic heart failure ([place]) 05/22/2016  . Mitral regurgitation 05/22/2016  . Near syncope 05/22/2016  . Delirium due to multiple etiologies, persistent, mixed level  of activity 12/13/2015  . MDD (major depressive disorder), single episode, moderate ([place]) 12/13/2015  . Fatigue 12/01/2015  . Helicobacter positive gastritis 08/06/2015  . Gastric ulcer 08/06/2015  . Iron deficiency anemia due to chronic blood loss 08/06/2015  . Anemia 07/21/2015  . Iron deficiency anemia 10/22/2014  . B12 deficiency 10/22/2014  . Symptomatic anemia 08/28/2014  . Headache 08/28/2014  . 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 COLONOSCOPY WITH PROPOFOL N/A 07/23/2015   Procedure: COLONOSCOPY WITH PROPOFOL;  Surgeon: Ronald Lobo, MD;  Location: [facility] ENDOSCOPY;  Service: Endoscopy;  Laterality: N/A;  . ESOPHAGOGASTRODUODENOSCOPY (EGD) WITH PROPOFOL N/A 07/23/2015   Procedure: ESOPHAGOGASTRODUODENOSCOPY (EGD) WITH PROPOFOL;  Surgeon: Ronald Lobo, MD;  Location: [facility] END[facility];  Service: Endoscopy;  Laterality: N/A;  . ESOPHAGOGASTRODUODENOSCOPY (EGD) WITH PROPOFOL N/A 11/18/2015   Procedure: ESOPHAGOGASTRODUODENOSCOPY (EGD) WITH PROPOFOL;  Surgeon: Ronald Lobo, MD;  Location: [facility] ENDOSCOPY;  Service: Endoscopy;  Laterality: N/A;  . RIGHT/LEFT HEART CATH AND CORONARY ANGIOGRAPHY N/A 05/22/2016   Procedure: Right/Left Heart Cath and Coronary Angiography;  Surgeon: Peter M Martinique, MD;  Location: [facility] CV LAB;  Service: Cardiovascular;  Laterality: N/A;  . SUBQ ICD IMPLANT N/A 09/27/2016   Procedure: SubQ ICD Implant;  Surgeon: Evans Lance, MD;  Location: [facility] CV LAB;  Service: Cardiovascular;  Laterality: N/A;     OB History   No obstetric history on file.     Family History  Problem Relation Age of Onset  . Cancer Paternal Grandfather   . Mental illness Neg Hx     Social History  Tobacco Use  . Smoking status: Never Smoker  . Smokeless tobacco: Never Used  Vaping Use  . Vaping Use: Never used  Substance Use Topics  . Alcohol use: No  . Drug use: No    Home Medications Prior to Admission  medications   Medication Sig Start Date End Date Taking? Authorizing Provider  HYDROcodone-acetaminophen (NORCO/VICODIN) 5-325 MG tablet Take 1 tablet by mouth every 6 (six) hours as needed for severe pain. 06/21/20  Yes Deliah Boston, PA-C  aspirin EC 81 MG tablet Take 1 tablet (81 mg total) by mouth daily. 04/15/19   Evans Lance, MD  carvedilol (COREG) 12.5 MG tablet Take 1 tablet (12.5 mg total) by mouth 2 (two) times daily with a meal. 04/15/19   Evans Lance, MD  digoxin (LANOXIN) 0.125 MG tablet Take 1 tablet (125 mcg total) by mouth daily. 04/15/19   Evans Lance, MD  Etanercept (ENBREL) 25 MG/0.5ML SOLN Inject into the skin every 30 (thirty) days.    [provider]  ferrous sulfate 325 (65 FE) MG tablet Take 1 tablet (325 mg total) by mouth daily with breakfast. 06/13/19   Ladell Pier, MD  furosemide (LASIX) 20 MG tablet Take 2 tablets (40 mg total) by mouth daily. 04/15/19   Evans Lance, MD  methocarbamol (ROBAXIN) 500 MG tablet Take 1 tablet (500 mg total) by mouth 2 (two) times daily as needed for muscle spasms. 09/18/17   Ladell Pier, MD  ondansetron (ZOFRAN ODT) 4 MG disintegrating tablet Take 1 tablet (4 mg total) by mouth every 8 (eight) hours as needed for nausea or vomiting. 06/08/20   Corena Herter, PA-C  PARoxetine (PAXIL) 30 MG tablet Take 1 tablet (30 mg total) by mouth daily. 06/04/20   [place] Crane, MD  Potassium Chloride ER 20 MEQ TBCR Take 20 mEq by mouth daily. 04/15/19   Evans Lance, MD  predniSONE (DELTASONE) 10 MG tablet Take 2 tablets (20 mg total) by mouth daily with breakfast. 06/04/20   Charlott Rakes, MD  sacubitril-valsartan (ENTRESTO) 49-51 MG Take 1 tablet by mouth 2 (two) times daily. 04/15/19   Evans Lance, MD  spironolactone (ALDACTONE) 25 MG tablet Take 1 tablet (25 mg total) by mouth daily. 04/15/19   Evans Lance, MD  topiramate (TOPAMAX) 25 MG tablet Take 1 tablet (25 mg total) by mouth at bedtime. 05/16/19    Ladell Pier, MD  traZODone (DESYREL) 100 MG tablet Take 1 tablet (100 mg total) by mouth at bedtime. 06/04/20   [place] Crane, MD  Vitamin D, Ergocalciferol, (DRISDOL) 1.25 MG (50000 UNIT) CAPS capsule Take 1 capsule (50,000 Units total) by mouth every 7 (seven) days. 06/18/19   Ladell Pier, MD  SUMAtriptan (IMITREX) 25 MG tablet Take 2 tablets (50 mg total) by mouth every 2 (two) hours as needed for migraine. May repeat in 2 hours if headache persists or recurs. 06/08/20 06/08/20  Corena Herter, PA-C    Allergies    Tramadol, Furosemide, and Leflunomide  Review of Systems   Review of Systems Ten systems are reviewed and are negative for acute change except as noted in the HPI  Physical Exam Updated Vital Signs BP 120/75 (BP Location: Right Arm)   Pulse 72   Temp 98.3 F (36.8 C) (Oral)   Resp 12   Ht 5\' 1"  (1.549 m)   Wt 61.2 kg   SpO2 99%   BMI 25.51 kg/m   Physical Exam Constitutional:  General: She is not in acute distress.    Appearance: Normal appearance. She is well-developed. She is not ill-appearing or diaphoretic.  HENT:     Head: Normocephalic and atraumatic.  Eyes:     General: Vision grossly intact. Gaze aligned appropriately.     Pupils: Pupils are equal, round, and reactive to light.  Neck:     Trachea: Trachea and phonation normal.  Pulmonary:     Effort: Pulmonary effort is normal. No respiratory distress.  Abdominal:     General: There is no distension.     Palpations: Abdomen is soft.     Tenderness: There is no abdominal tenderness. There is no guarding or rebound.  Musculoskeletal:        General: Normal range of motion.     Cervical back: Normal range of motion.     Comments: Right wrist: Tenderness around the distal radius and right scaphoid no overlying skin changes.  Appropriate range of motion and strength for age and condition.  Capillary refill sensation intact all fingers.  Strong equal radial pulse - Right knee: Mild diffuse  swelling present, tenderness primarily along the medial joint line.  No skin break.  Flexion and extension is intact with increased pain, patella tendon intact. - Left knee: Normal appearance, no skin breaks.  Tenderness primarily along the medial joint line.  Flexion extension intact with increased pain, patella tendon intact. - Capillary refill intact, strong equal pedal pulses, compartments soft..  Skin:    General: Skin is warm and dry.  Neurological:     Mental Status: She is alert.     GCS: GCS eye subscore is 4. GCS verbal subscore is 5. GCS motor subscore is 6.     Comments: Speech is clear and goal oriented, follows commands Major Cranial nerves without deficit, no facial droop Moves extremities without ataxia, coordination intact  Psychiatric:        Behavior: Behavior normal.     ED Results / Procedures / Treatments   Labs (all labs ordered are listed, but only abnormal results are displayed) Labs Reviewed  CBC WITH DIFFERENTIAL/PLATELET - Abnormal; Notable for the following components:      Result Value   WBC 17.9 (*)    RBC 3.69 (*)    Hemoglobin 10.5 (*)    HCT 33.3 (*)    Neutro Abs 16.0 (*)    Abs Immature Granulocytes 0.24 (*)    All other components within normal limits  BASIC METABOLIC PANEL - Abnormal; Notable for the following components:   Sodium 134 (*)    Glucose, Bld 160 (*)    Calcium 8.3 (*)    All other components within normal limits    EKG None  Radiology DG Knee Complete 4 Views Left  Result Date: 06/21/2020 CLINICAL DATA:  Pain of bilateral wrists and knees. EXAM: LEFT KNEE - COMPLETE 4+ VIEW COMPARISON:  None FINDINGS: No sign of acute fracture or dislocation about the LEFT knee. No sign of joint effusion. IMPRESSION: Negative evaluation of the LEFT knee. Electronically Signed   By: Zetta Bills M.D.   On: 06/21/2020 16:45   DG Knee Complete 4 Views Right  Result Date: 06/21/2020 CLINICAL DATA:  Pain, history of rheumatoid arthritis  EXAM: RIGHT KNEE - COMPLETE 4+ VIEW COMPARISON:  None. FINDINGS: No fracture or dislocation of the right knee. Joint spaces are well preserved. There is a large, nonspecific knee joint effusion. No evidence of arthropathy or other focal bone abnormality. Soft tissues are  unremarkable. IMPRESSION: 1. No fracture or dislocation of the right knee. Joint spaces are well preserved. 2.  There is a large, nonspecific knee joint effusion. Electronically Signed   By: Eddie Candle M.D.   On: 06/21/2020 16:48    Procedures Procedures   Medications Ordered in ED Medications  oxyCODONE-acetaminophen (PERCOCET/ROXICET) 5-325 MG per tablet 1 tablet (1 tablet Oral Given 06/21/20 1812)    ED Course  I have reviewed the triage vital signs and the nursing notes.  Pertinent labs & imaging results that were available during my care of the patient were reviewed by me and considered in my medical decision making (see chart for details).    MDM Rules/Calculators/A&P                         Additional history obtained from: 1. Nursing notes from this visit. 2. Electronic Medical Record. --------------------- DG Right Wrist:  IMPRESSION: 1. No fracture or dislocation of the right wrist. The carpus is normally aligned. Joint spaces are well preserved. 2. Diffuse soft tissue edema about the wrist.  DG Right Knee:  IMPRESSION:  1. No fracture or dislocation of the right knee. Joint spaces are  well preserved.    2. There is a large, nonspecific knee joint effusion.   DG Left Knee: IMPRESSION:  Negative evaluation of the LEFT knee.   BMP shows no emergent Electra derangement, AKI or gap. CBC shows acidosis of 17.9, anemia 10.5, no thrombocytopenia.  Patient's anemia is similar to prior of 11.2.  Leukocytosis is somewhat higher than 13 days ago of 12.5, 2 weeks ago it was 15.4 -------------- Patient with complicated medical history including stills disease and rheumatoid arthritis presented for bilateral  atraumatic knee pain that began yesterday.  On exam there is some swelling present no evidence of significant trauma and no overlying skin changes.  Additionally she is neurovascular tact distally.  Patient does have leukocytosis but appears to be chronically elevated patient is currently on prednisone which would explain WBC elevation.  Low suspicion for septic arthritis as cause of patient's symptoms at this time suspect this to be exacerbation of rheumatoid arthritis possibly stills.  No evidence for neurovascular compromise, compartment syndrome, DVT, cellulitis, septic arthritis or other emergent pathologies requiring further work-up in the ER.  Plan of care is for patient to follow-up with her rheumatologist and PCP will also give referral to orthopedist.  Patient given 1 Percocet in the ER for her acute pain will give short course of Norco for her symptoms.  Patient states understanding narcotic precautions and her family member is driving her home today.  As to patient's right wrist patient reports is chronic and atraumatic she does have some tenderness overlying the scaphoid so will place patient in thumb spica splint and encourage close orthopedic follow-up.  Patient referred to on-call hand specialist Dr. Jeannie Fend given concern for scaphoid pathology.  At this time there does not appear to be any evidence of an acute emergency medical condition and the patient appears stable for discharge with appropriate outpatient follow up. Diagnosis was discussed with patient who verbalizes understanding of care plan and is agreeable to discharge. I have discussed return precautions with patient who verbalizes understanding. Patient encouraged to follow-up with their PCP, rheumatologist and orthopedist. All questions answered.  Patient's case discussed with Dr. Eulis Foster who agrees with plan to discharge with follow-up.   Note: Portions of this report may have been transcribed using voice recognition software.  Every effort was made to ensure accuracy; however, inadvertent computerized transcription errors may still be present. Final Clinical Impression(s) / ED Diagnoses Final diagnoses:  Acute pain of both knees  Right wrist pain    Rx / DC Orders ED Discharge Orders         Ordered    HYDROcodone-acetaminophen (NORCO/VICODIN) 5-325 MG tablet  Every 6 hours PRN        06/21/20 1832           Gari Crown 06/21/20 1835    Daleen Bo, MD 06/22/20 1030

## 2020-06-23 ENCOUNTER — Ambulatory Visit: Payer: Self-pay | Attending: Internal Medicine

## 2020-06-23 ENCOUNTER — Other Ambulatory Visit: Payer: Self-pay

## 2020-06-24 NOTE — Progress Notes (Signed)
Remote ICD transmission.   

## 2020-06-25 ENCOUNTER — Other Ambulatory Visit: Payer: Self-pay | Admitting: Physician Assistant

## 2020-06-30 ENCOUNTER — Other Ambulatory Visit: Payer: Self-pay | Admitting: Internal Medicine

## 2020-07-01 ENCOUNTER — Other Ambulatory Visit: Payer: Self-pay | Admitting: Internal Medicine

## 2020-07-01 DIAGNOSIS — G43009 Migraine without aura, not intractable, without status migrainosus: Secondary | ICD-10-CM

## 2020-07-01 NOTE — Telephone Encounter (Signed)
Requested medication (s) are due for refill today: expired medication  Requested medication (s) are on the active medication list: no  Last refill:  05/15/20  Future visit scheduled: yes in 1 month  Notes to clinic:  not delegated per protocol, expired medication. Do you want to renew Rx?     Requested Prescriptions  Pending Prescriptions Disp Refills   butalbital-acetaminophen-caffeine (FIORICET) 50-325-40 MG tablet [Pharmacy Med Name: BUTALB-ACETAMIN-CAFF 50-325 50-325-40 Tablet] 30 tablet 1    Sig: Take 1-2 tablets by mouth 2 (two) times daily as needed for headache or migraine.      Not Delegated - Analgesics:  Non-Opioid Analgesic Combinations Failed - 07/01/2020  5:02 PM      Failed - This refill cannot be delegated      Passed - Valid encounter within last 12 months    Recent Outpatient Visits           6 months ago Breast mass, right   [place], Deborah B, MD   1 year ago Migraine without aura and without status migrainosus, not intractable   [place], Deborah B, MD   1 year ago Migraine without aura and without status migrainosus, not intractable   [place], MD   2 years ago Abnormal LFTs   [place], MD   2 years ago Abnormal LFTs   [place], MD       Future Appointments             In 1 month Wynetta Emery Dalbert Batman, MD [place]

## 2020-07-02 ENCOUNTER — Ambulatory Visit (HOSPITAL_COMMUNITY): Payer: Federal, State, Local not specified - Other | Admitting: Licensed Clinical Social Worker

## 2020-07-03 ENCOUNTER — Other Ambulatory Visit: Payer: Self-pay

## 2020-07-03 MED FILL — Butalbital-Acetaminophen-Caffeine Tab 50-325-40 MG: ORAL | 30 days supply | Qty: 30 | Fill #0 | Status: AC

## 2020-07-04 ENCOUNTER — Other Ambulatory Visit: Payer: Self-pay

## 2020-07-07 ENCOUNTER — Other Ambulatory Visit: Payer: Self-pay

## 2020-07-09 ENCOUNTER — Other Ambulatory Visit: Payer: Self-pay

## 2020-07-09 MED ORDER — PAROXETINE HCL 30 MG PO TABS
30.0000 mg | ORAL_TABLET | Freq: Every day | ORAL | 11 refills | Status: DC
  Filled 2020-07-09: qty 30, 30d supply, fill #0

## 2020-07-09 MED FILL — Trazodone HCl Tab 100 MG: ORAL | 30 days supply | Qty: 30 | Fill #0 | Status: AC

## 2020-07-12 ENCOUNTER — Encounter (INDEPENDENT_AMBULATORY_CARE_PROVIDER_SITE_OTHER): Payer: Self-pay

## 2020-07-12 ENCOUNTER — Other Ambulatory Visit: Payer: Self-pay

## 2020-07-12 ENCOUNTER — Telehealth: Payer: Self-pay | Admitting: Internal Medicine

## 2020-07-12 NOTE — Telephone Encounter (Signed)
Pt came to [facility] w/ a letter dated (March 9th,2022) mailed by [facility] Nurse stating we've tried contacting for lab results from 06/04/20. Pt did not have my chart and was not able to view results. Pt left w/ My Chart activation code to set up and asked if a nurse could call her telephone number since it was confirmed on this visit. Please advise and thank you

## 2020-07-13 NOTE — Telephone Encounter (Signed)
Patient name and DOB verified.  Informed of result note from 06/07/2020 from Dr. Margarita Rana.  Verbalized understanding and will make appt with Rheumatology for f/u.

## 2020-07-21 ENCOUNTER — Telehealth (HOSPITAL_COMMUNITY): Payer: Self-pay | Admitting: Psychiatry

## 2020-07-21 ENCOUNTER — Other Ambulatory Visit: Payer: Self-pay

## 2020-07-21 DIAGNOSIS — F3342 Major depressive disorder, recurrent, in full remission: Secondary | ICD-10-CM

## 2020-07-21 MED ORDER — TRAZODONE HCL 100 MG PO TABS
ORAL_TABLET | ORAL | 1 refills | Status: DC
  Filled 2020-07-21: qty 60, fill #0
  Filled 2020-07-23: qty 60, 30d supply, fill #0

## 2020-07-21 NOTE — Telephone Encounter (Signed)
Rx for Trazodone sent 

## 2020-07-22 ENCOUNTER — Other Ambulatory Visit: Payer: Self-pay

## 2020-07-23 ENCOUNTER — Ambulatory Visit (HOSPITAL_COMMUNITY): Payer: Federal, State, Local not specified - Other | Admitting: Licensed Clinical Social Worker

## 2020-07-23 ENCOUNTER — Other Ambulatory Visit: Payer: Self-pay | Admitting: Internal Medicine

## 2020-07-23 ENCOUNTER — Other Ambulatory Visit: Payer: Self-pay

## 2020-07-23 MED ORDER — BUTALBITAL-APAP-CAFFEINE 50-325-40 MG PO TABS: ORAL_TABLET | ORAL | 0 refills | Status: DC

## 2020-07-23 NOTE — Telephone Encounter (Signed)
Requested medication (s) are due for refill today: yes  Requested medication (s) are on the active medication list: yes  Last refill:  07/07/20  Future visit scheduled: yes  Notes to clinic:  not delegated    Requested Prescriptions  Pending Prescriptions Disp Refills   butalbital-acetaminophen-caffeine (FIORICET) 50-325-40 MG tablet 30 tablet 0    Sig: TAKE 1-2 TABLETS BY MOUTH 2 (TWO) TIMES DAILY AS NEEDED FOR HEADACHE OR MIGRAINE.      Not Delegated - Analgesics:  Non-Opioid Analgesic Combinations Failed - 07/23/2020  8:58 AM      Failed - This refill cannot be delegated      Passed - Valid encounter within last 12 months    Recent Outpatient Visits           7 months ago Breast mass, right   [place], Deborah B, MD   1 year ago Migraine without aura and without status migrainosus, not intractable   [place], Deborah B, MD   1 year ago Migraine without aura and without status migrainosus, not intractable   [place], MD   2 years ago Abnormal LFTs   [place], MD   2 years ago Abnormal LFTs   [place], MD       Future Appointments             In 2 weeks Ladell Pier, MD [place]

## 2020-07-26 ENCOUNTER — Other Ambulatory Visit: Payer: Self-pay

## 2020-07-27 ENCOUNTER — Other Ambulatory Visit: Payer: Self-pay

## 2020-07-27 ENCOUNTER — Other Ambulatory Visit: Payer: Self-pay | Admitting: Internal Medicine

## 2020-07-28 ENCOUNTER — Other Ambulatory Visit: Payer: Self-pay

## 2020-08-06 ENCOUNTER — Encounter: Payer: Self-pay | Admitting: Internal Medicine

## 2020-08-06 ENCOUNTER — Other Ambulatory Visit: Payer: Self-pay

## 2020-08-06 ENCOUNTER — Telehealth (HOSPITAL_COMMUNITY): Payer: Federal, State, Local not specified - Other | Admitting: Psychiatry

## 2020-08-06 ENCOUNTER — Ambulatory Visit: Payer: Self-pay | Attending: Internal Medicine | Admitting: Internal Medicine

## 2020-08-06 VITALS — BP 130/82 | HR 74 | Resp 18 | Ht 60.0 in | Wt 132.0 lb

## 2020-08-06 DIAGNOSIS — G43011 Migraine without aura, intractable, with status migrainosus: Secondary | ICD-10-CM

## 2020-08-06 DIAGNOSIS — M0609 Rheumatoid arthritis without rheumatoid factor, multiple sites: Secondary | ICD-10-CM

## 2020-08-06 MED ORDER — BUTALBITAL-APAP-CAFFEINE 50-325-40 MG PO TABS: ORAL_TABLET | ORAL | 0 refills | Status: DC

## 2020-08-06 MED ORDER — TOPIRAMATE 25 MG PO TABS
ORAL_TABLET | ORAL | 5 refills | Status: DC
  Filled 2020-08-06: qty 60, 30d supply, fill #0

## 2020-08-06 MED ORDER — NAPROXEN 500 MG PO TABS
ORAL_TABLET | ORAL | 2 refills | Status: DC
  Filled 2020-08-06: qty 60, 30d supply, fill #0

## 2020-08-06 MED ORDER — PREDNISONE 10 MG PO TABS
ORAL_TABLET | ORAL | 0 refills | Status: DC
  Filled 2020-08-06: qty 10, 8d supply, fill #0

## 2020-08-06 MED ORDER — BUTALBITAL-APAP-CAFFEINE 50-325-40 MG PO TABS
ORAL_TABLET | ORAL | 0 refills | Status: DC
  Filled 2020-08-06: qty 30, 7d supply, fill #0

## 2020-08-06 MED ORDER — PREDNISONE 10 MG PO TABS
ORAL_TABLET | ORAL | 0 refills | Status: DC
  Filled 2020-08-06: qty 48, 12d supply, fill #0

## 2020-08-06 NOTE — Progress Notes (Signed)
Pt also states having really bad headaches.

## 2020-08-06 NOTE — Patient Instructions (Signed)

## 2020-08-06 NOTE — Progress Notes (Signed)
Patient ID: Kathy Rosales, female    DOB: 18-Jul-1975  MRN: [id number]  CC: Hand Pain   Subjective: Kathy Rosales is a 45 y.o. 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 Followed by [facility] Rheumatology), migraine HA.  C/o sharp pains and swelling in both hands dorsal surface close to the wrist x2 weeks.  Right worse than the left.  Decreased strength because of the pain. Last saw Rheum 1 mth ago. Has appt with them later today but expensive and they do not accept [facility] discount.  Would like to be referred to a rheumatologist within the [facility] system for future appointments.  On Prednisone 5 mg daily.  Not on Embrel.    Migraines: strong HA almost daily. Taking Fioricet daily for the last few wks.  Once a day.  Sometimes she takes [facility] Powder a few a day on other days.  Out of Topamax for a while HA in temple areas, sharp in character; last 1 1/2 days. Some nausea.  +photophobia.  Better laying down.  Fiorcet takes 30 mins to kick in and effects last 3-4 days.  Triggers include stress, perimenstrual period.     Patient Active Problem List   Diagnosis Date Noted  . Recurrent major depressive disorder, in full remission ([place]) 06/04/2020  . Vitamin D deficiency 06/18/2019  . Migraine without aura and without status migrainosus, not intractable 05/16/2019  . 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  . Rheumatoid arthritis of multiple sites with negative rheumatoid factor ([place]) 11/19/2017  . Left-sided weakness   . Unresponsive   . Chronic systolic heart failure ([place]) 05/22/2016  . Mitral regurgitation 05/22/2016  . Near syncope 05/22/2016  . Delirium due to multiple etiologies, persistent, mixed level of activity 12/13/2015  . MDD (major depressive disorder), single episode,  moderate ([place]) 12/13/2015  . Fatigue 12/01/2015  . Helicobacter positive gastritis 08/06/2015  . Gastric ulcer 08/06/2015  . Iron deficiency anemia due to chronic blood loss 08/06/2015  . Anemia 07/21/2015  . Iron deficiency anemia 10/22/2014  . B12 deficiency 10/22/2014  . Symptomatic anemia 08/28/2014  . Headache 08/28/2014  . 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 aspirin EC 81 MG tablet Take 1 tablet (81 mg total) by mouth daily. 90 tablet 3  . carvedilol (COREG) 12.5 MG tablet Take 1 tablet (12.5 mg total) by mouth 2 (two) times daily with a meal. 180 tablet 3  . digoxin (LANOXIN) 0.125 MG tablet Take 1 tablet (125 mcg total) by mouth daily. 90 tablet 3  . etodolac (LODINE) 400 MG tablet 1 TABLET WITH FOOD ORALLY TWICE A DAY 30 DAY(S) 60 tablet 2  . ferrous sulfate 325 (65 FE) MG tablet Take 1 tablet (325 mg total) by mouth daily with breakfast. 100 tablet 1  . furosemide (LASIX) 20 MG tablet Take 2 tablets (40 mg total) by mouth daily. 180 tablet 3  . methocarbamol (ROBAXIN) 500 MG tablet Take 1 tablet (500 mg total) by mouth 2 (two) times daily as needed for muscle spasms. 20 tablet 0  . PARoxetine (PAXIL) 30 MG tablet Take 1 tablet by mouth daily 30 tablet 11  . Potassium Chloride ER 20 MEQ TBCR Take 20 mEq by mouth daily. 90 tablet 3  .  sacubitril-valsartan (ENTRESTO) 49-51 MG Take 1 tablet by mouth 2 (two) times daily. 180 tablet 3  . spironolactone (ALDACTONE) 25 MG tablet Take 1 tablet (25 mg total) by mouth daily. 90 tablet 3  . traZODone (DESYREL) 100 MG tablet TAKE 1 TABLET (100 MG TOTAL) BY MOUTH AT BEDTIME. 30 tablet 2  . Vitamin D, Ergocalciferol, (DRISDOL) 1.25 MG (50000 UNIT) CAPS capsule Take 1 capsule (50,000 Units total) by mouth every 7 (seven) days. 12 capsule 1  . ondansetron (ZOFRAN ODT) 4 MG disintegrating tablet Take 1 tablet (4 mg total) by mouth every 8 (eight) hours as  needed for nausea or vomiting. (Patient not taking: Reported on 08/06/2020) 20 tablet 0  . ondansetron (ZOFRAN-ODT) 4 MG disintegrating tablet TAKE 1 TAB BY MOUTH EVERY 8 HOURS AS NEEDED FOR NAUSEA OR VOMITING (Patient not taking: Reported on 08/06/2020) 20 tablet 0  . PARoxetine (PAXIL) 30 MG tablet Take 1 tablet (30 mg total) by mouth daily. (Patient not taking: Reported on 08/06/2020) 30 tablet 2  . traZODone (DESYREL) 100 MG tablet Take 1 to 2 tablets at bedtime as needed (Patient not taking: Reported on 08/06/2020) 60 tablet 1  . [DISCONTINUED] SUMAtriptan (IMITREX) 25 MG tablet Take 2 tablets (50 mg total) by mouth every 2 (two) hours as needed for migraine. May repeat in 2 hours if headache persists or recurs. 10 tablet 0   No current facility-administered medications on file prior to visit.    Allergies  Allergen Reactions  . Tramadol Itching and Other (See Comments)    Red spots all over body and itching  . Furosemide Rash  . Leflunomide Rash    Social History   Socioeconomic History  . Marital status: Single    Spouse name: Not on file  . Number of children: 1  . Years of education: Not on file  . Highest education level: Not on file  Occupational History  . Occupation: unemployeed  Tobacco Use  . Smoking status: Never Smoker  . Smokeless tobacco: Never Used  Vaping Use  . Vaping Use: Never used  Substance and Sexual Activity  . Alcohol use: No  . Drug use: No  . Sexual activity: Never    Birth control/protection: None  Other Topics Concern  . Not on file  Social History Narrative   ** Merged History Encounter **       ** Merged History Encounter **       Social Determinants of Health   Financial Resource Strain: Not on file  Food Insecurity: Not on file  Transportation Needs: Not on file  Physical Activity: Not on file  Stress: Not on file  Social Connections: Not on file  Intimate Partner Violence: Not on file    Family History  Problem Relation Age of  Onset  . Cancer Paternal Grandfather   . Mental illness Neg Hx     Past Surgical History:  Procedure Laterality Date  . COLONOSCOPY WITH PROPOFOL N/A 07/23/2015   Procedure: COLONOSCOPY WITH PROPOFOL;  Surgeon: Ronald Lobo, MD;  Location: [facility];  Service: Endoscopy;  Laterality: N/A;  . ESOPHAGOGASTRODUODENOSCOPY (EGD) WITH PROPOFOL N/A 07/23/2015   Procedure: ESOPHAGOGASTRODUODENOSCOPY (EGD) WITH PROPOFOL;  Surgeon: Ronald Lobo, MD;  [facility];  Service: Endoscopy;  Laterality: N/A;  . ESOPHAGOGASTRODUODENOSCOPY (EGD) WITH PROPOFOL N/A 11/18/2015   Procedure: ESOPHAGOGASTRODUODENOSCOPY (EGD) WITH PROPOFOL;  Surgeon: Ronald Lobo, MD;  [facility];  Service: Endoscopy;  Laterality: N/A;  . RIGHT/LEFT HEART CATH AND CORONARY ANGIOGRAPHY N/A 05/22/2016  Procedure: Right/Left Heart Cath and Coronary Angiography;  Surgeon: Peter M Martinique, MD;  Location: [place] [facility];  Service: Cardiovascular;  Laterality: N/A;  . SUBQ ICD IMPLANT N/A 09/27/2016   Procedure: SubQ ICD Implant;  Surgeon: Evans Lance, MD;  Location: [place] [facility];  Service: Cardiovascular;  Laterality: N/A;    ROS: Review of Systems Negative except as stated above  PHYSICAL EXAM: BP 130/82   Pulse 74   Resp 18   Ht 5' (1.524 m)   Wt 132 lb (59.9 kg)   SpO2 95%   BMI 25.78 kg/m   Physical Exam  General appearance - alert, well appearing, and in no distress Mental status - normal mood, behavior, speech, dress, motor activity, and thought processes Chest - clear to auscultation, no wheezes, rales or rhonchi, symmetric air entry Heart - normal rate, regular rhythm, normal S1, S2, no murmurs, rubs, clicks or gallops Neurological - alert, oriented, normal speech, no focal findings or movement disorder noted, cranial nerves II through XII intact, motor and sensory grossly normal bilaterally.  Reflexes in the knees and upper extremities normal.  Gait is normal. Musculoskeletal  -patient with mild edema noted of the right wrist and close to the carpometacarpal joint of the right hand.  No signs of active tenosynovitis noted in the left hand.  Grip decreased bilaterally  CMP Latest Ref Rng & Units 06/21/2020 06/08/2020 06/04/2020  Glucose 70 - 99 mg/dL 160(H) 105(H) 75  BUN 6 - 20 mg/dL 14 10 11   Creatinine 0.44 - 1.00 mg/dL 0.52 0.46 0.65  Sodium 135 - 145 mmol/L 134(L) 135 142  Potassium 3.5 - 5.1 mmol/L 3.9 3.9 4.3  Chloride 98 - 111 mmol/L 101 104 103  CO2 22 - 32 mmol/L 25 23 21   Calcium 8.9 - 10.3 mg/dL 8.3(L) 8.5(L) 8.5(L)  Total Protein 6.5 - 8.1 g/dL - 7.1 -  Total Bilirubin 0.3 - 1.2 mg/dL - 0.3 -  Alkaline Phos 38 - 126 U/L - 85 -  AST 15 - 41 U/L - 21 -  ALT 0 - 44 U/L - 14 -   Lipid Panel     Component Value Date/Time   CHOL 149 12/14/2015 0628   TRIG 180 (H) 12/14/2015 0628   HDL 40 (L) 12/14/2015 0628   CHOLHDL 3.7 12/14/2015 0628   VLDL 36 12/14/2015 0628   LDLCALC 73 12/14/2015 0628    CBC    Component Value Date/Time   WBC 17.9 (H) 06/21/2020 1416   RBC 3.69 (L) 06/21/2020 1416   HGB 10.5 (L) 06/21/2020 1416   HGB 12.4 06/04/2020 1007   HGB 11.2 (L) 11/30/2015 0944   HCT 33.3 (L) 06/21/2020 1416   HCT 37.9 06/04/2020 1007   HCT 36.5 11/30/2015 0944   PLT 390 06/21/2020 1416   PLT 227 06/04/2020 1007   MCV 90.2 06/21/2020 1416   MCV 87 06/04/2020 1007   MCV 83.0 11/30/2015 0944   MCH 28.5 06/21/2020 1416   MCHC 31.5 06/21/2020 1416   RDW 13.9 06/21/2020 1416   RDW 13.0 06/04/2020 1007   RDW 19.4 (H) 11/30/2015 0944   LYMPHSABS 0.9 06/21/2020 1416   LYMPHSABS 0.8 06/04/2020 1007   LYMPHSABS 0.4 (L) 11/30/2015 0944   MONOABS 0.5 06/21/2020 1416   MONOABS 0.2 11/30/2015 0944   EOSABS 0.1 06/21/2020 1416   EOSABS 0.2 06/04/2020 1007   BASOSABS 0.1 06/21/2020 1416   BASOSABS 0.1 06/04/2020 1007   BASOSABS 0.0 11/30/2015 0944  ASSESSMENT AND PLAN: 1. Intractable migraine without aura and with status migrainosus Patient  with known migraine headaches with spell of status migrainous over the past 2 weeks. -Component of medication rebound headache.  We will give a higher dose of prednisone to try break this current cycle -Advised to stop BC powders. -Restart Topamax and increase to 50 mg after 1 week. -Not able to use triptans given her heart disease.  Continue Fioricet but advised to use it sparingly.  Advised to take 2 tablets a day or 2 before the start of her menses. -Advised of the importance of healthy eating habits, regular exercise and getting adequate sleep. - topiramate (TOPAMAX) 25 MG tablet; Take 1 tablet by mouth nightly for the first week, then 2 tablets nightly  Dispense: 60 tablet; Refill: 5 - predniSONE (DELTASONE) 10 MG tablet; Take 1.5 tablets by mouth daily for 4 days, then 1 tablet for 4 days, then resume previous dose Prednisone  Dispense: 10 tablet; Refill: 0 - butalbital-acetaminophen-caffeine (FIORICET) 50-325-40 MG tablet; TAKE 1-2 TABLETS BY MOUTH 2 (TWO) TIMES DAILY AS NEEDED FOR HEADACHE OR MIGRAINE.  Dispense: 30 tablet; Refill: 0  2. Rheumatoid arthritis of multiple sites with negative rheumatoid factor ([place]) -We will give increased dose of prednisone for several days then she will go back to her maintenance dose of 5 mg.  I have [facility] a referral [facility] rheumat[facility] since she is no longer able to afford [facility] rheumatology. - predniSONE (DELTASONE) 10 MG tablet; Take 1.5 tablets by mouth daily for 4 days, then 1 tablet for 4 days, then resume previous dose Prednisone  Dispense: 10 tablet; Refill: 0 - Ambulatory referral to Rheumatology     Patient was given the opportunity to ask questions.  Patient verbalized understanding of the plan and was able to repeat key elements of the plan.   Orders Placed This Encounter  Procedures  . Ambulatory referral to Rheumatology     Requested Prescriptions   Signed Prescriptions Disp Refills  . topiramate (TOPAMAX)  25 MG tablet 60 tablet 5    Sig: Take 1 tablet by mouth nightly for the first week, then 2 tablets nightly  . predniSONE (DELTASONE) 10 MG tablet 10 tablet 0    Sig: Take 1.5 tablets by mouth daily for 4 days, then 1 tablet for 4 days, then resume previous dose Prednisone  . butalbital-acetaminophen-caffeine (FIORICET) 50-325-40 MG tablet 30 tablet 0    Sig: TAKE 1-2 TABLETS BY MOUTH 2 (TWO) TIMES DAILY AS NEEDED FOR HEADACHE OR MIGRAINE.    Return in about 7 weeks (around 09/24/2020) for as a telephone visit for f/u migraine.  Karle Plumber, MD, FACP

## 2020-08-18 ENCOUNTER — Other Ambulatory Visit: Payer: Self-pay

## 2020-08-18 DIAGNOSIS — N644 Mastodynia: Secondary | ICD-10-CM

## 2020-08-20 ENCOUNTER — Telehealth (INDEPENDENT_AMBULATORY_CARE_PROVIDER_SITE_OTHER): Payer: No Payment, Other | Admitting: Psychiatry

## 2020-08-20 ENCOUNTER — Encounter (HOSPITAL_COMMUNITY): Payer: Self-pay | Admitting: Psychiatry

## 2020-08-20 ENCOUNTER — Other Ambulatory Visit: Payer: Self-pay

## 2020-08-20 DIAGNOSIS — F3342 Major depressive disorder, recurrent, in full remission: Secondary | ICD-10-CM

## 2020-08-20 MED ORDER — PAROXETINE HCL 30 MG PO TABS
30.0000 mg | ORAL_TABLET | Freq: Every day | ORAL | 2 refills | Status: DC
  Filled 2020-08-20: qty 30, 30d supply, fill #0
  Filled 2020-09-23: qty 30, 30d supply, fill #1
  Filled 2020-10-25 – 2020-11-02 (×2): qty 30, 30d supply, fill #2

## 2020-08-20 MED ORDER — TRAZODONE HCL 100 MG PO TABS
ORAL_TABLET | ORAL | 2 refills | Status: DC
  Filled 2020-08-20: qty 60, 30d supply, fill #0
  Filled 2020-09-23: qty 60, 30d supply, fill #1
  Filled 2020-10-25 – 2020-11-02 (×2): qty 60, 30d supply, fill #2

## 2020-08-20 NOTE — Progress Notes (Signed)
[facility] Progress Note  Virtual Visit via Telephone Note  I connected with Kathy Rosales on 08/20/20 at  8:40 AM EDT by telephone and verified that I am speaking with the correct person using two identifiers.  Location: Patient: home Provider: Clinic   I discussed the limitations, risks, security and privacy concerns of performing an evaluation and management service by telephone and the availability of in person appointments. I also discussed with the patient that there may be a patient responsible charge related to this service. The patient expressed understanding and agreed to proceed.   I provided 9 minutes of non-face-to-face time during this encounter.   Patient Identification: Kathy Rosales MRN:  [id number] Date of Evaluation:  08/20/2020    Chief Complaint:   "I am doing very well."  Visit Diagnosis:    ICD-10-CM   1. Recurrent major depressive disorder, in full remission ([place])  F33.42 PARoxetine (PAXIL) 30 MG tablet    traZODone (DESYREL) 100 MG tablet    History of Present Illness: Patient reported she is doing really well.  She stated that her mood is stable.  She is sleeping well at night.  She stated that sometimes she takes 2 tablets of trazodone instead of 1 and therefore she requests 60 tablets of trazodone. She stated that her personal and professional life are going well and she denies any other concerns today.   Past Psychiatric History: MDD  Previous Psychotropic Medications: Yes   Substance Abuse History in the last 12 months:  No.  Consequences of Substance Abuse: NA  Past Medical History:  Past Medical History:  Diagnosis Date  . Acute systolic CHF (congestive heart failure) ([place]) 05/22/2016  . Blood transfusion without reported diagnosis   . Still's disease [facility])     Past Surgical History:  Procedure Laterality Date  . COLONOSCOPY WITH PROPOFOL N/A 07/23/2015   Procedure: COLONOSCOPY WITH PROPOFOL;  Surgeon: Ronald Lobo, MD;  [facility];  Service: Endoscopy;  Laterality: N/A;  . ESOPHAGOGASTRODUODENOSCOPY (EGD) WITH PROPOFOL N/A 07/23/2015   Procedure: ESOPHAGOGASTRODUODENOSCOPY (EGD) WITH PROPOFOL;  Surgeon: Ronald Lobo, MD;  [facility];  Service: Endoscopy;  Laterality: N/A;  . ESOPHAGOGASTRODUODENOSCOPY (EGD) WITH PROPOFOL N/A 11/18/2015   Procedure: ESOPHAGOGASTRODUODENOSCOPY (EGD) WITH PROPOFOL;  Surgeon: Ronald Lobo, MD;  [facility];  Service: Endoscopy;  Laterality: N/A;  . RIGHT/LEFT HEART CATH AND CORONARY ANGIOGRAPHY N/A 05/22/2016   Procedure: Right/Left Heart Cath and Coronary Angiography;  Surgeon: Peter M Martinique, MD;  Location: [place] [facility];  Service: Cardiovascular;  Laterality: N/A;  . SUBQ ICD IMPLANT N/A 09/27/2016   Procedure: SubQ ICD Implant;  Surgeon: Evans Lance, MD;  Location: [place] [facility];  Service: Cardiovascular;  Laterality: N/A;    Family Psychiatric History: denied  Family History:  Family History  Problem Relation Age of Onset  . Cancer Paternal Grandfather   . Mental illness Neg Hx     Social History:   Social History   Socioeconomic History  . Marital status: Single    Spouse name: Not on file  . Number of children: 1  . Years of education: Not on file  . Highest education level: Not on file  Occupational History  . Occupation: unemployeed  Tobacco Use  . Smoking status: Never Smoker  . Smokeless tobacco: Never Used  Vaping Use  . Vaping Use: Never used  Substance and Sexual Activity  . Alcohol use: No  . Drug use: No  .  Sexual activity: Never    Birth control/protection: None  Other Topics Concern  . Not on file  Social History Narrative   ** Merged History Encounter **       ** Merged History Encounter **       Social Determinants of Health   Financial Resource Strain: Not on file  Food Insecurity: Not on file  Transportation Needs: Not on file  Physical Activity: Not on file   Stress: Not on file  Social Connections: Not on file    Additional Social History: Works as a Engineer, building services.  Has been separated from her husband.  Has 1 son who is 70 years old.  Allergies:   Allergies  Allergen Reactions  . Tramadol Itching and Other (See Comments)    Red spots all over body and itching  . Furosemide Rash  . Leflunomide Rash    Metabolic Disorder Labs: Lab Results  Component Value Date   HGBA1C 5.8 (H) 12/14/2015   MPG 120 12/14/2015   No results found for: PROLACTIN Lab Results  Component Value Date   CHOL 149 12/14/2015   TRIG 180 (H) 12/14/2015   HDL 40 (L) 12/14/2015   CHOLHDL 3.7 12/14/2015   VLDL 36 12/14/2015   LDLCALC 73 12/14/2015   LDLCALC  09/08/2008    83        Total Cholesterol/HDL:CHD Risk Coronary Heart Disease Risk Table                     Men   Women  1/2 Average Risk   3.4   3.3  Average Risk       5.0   4.4  2 X Average Risk   9.6   7.1  3 X Average Risk  23.4   11.0        Use the calculated Patient Ratio above and the CHD Risk Table to determine the patient's CHD Risk.        ATP III CLASSIFICATION (LDL):  <100     mg/dL   Optimal  100-129  mg/dL   Near or Above                    Optimal  130-159  mg/dL   Borderline  160-189  mg/dL   High  >190     mg/dL   Very High   Lab Results  Component Value Date   TSH 2.530 06/09/2019    Therapeutic Level Labs: No results found for: LITHIUM No results found for: CBMZ No results found for: VALPROATE  Current Medications: Current Outpatient Medications  Medication Sig Dispense Refill  . aspirin EC 81 MG tablet Take 1 tablet (81 mg total) by mouth daily. 90 tablet 3  . butalbital-acetaminophen-caffeine (FIORICET) 50-325-40 MG tablet TAKE 1-2 TABLETS BY MOUTH 2 (TWO) TIMES DAILY AS NEEDED FOR HEADACHE OR MIGRAINE. 30 tablet 0  . butalbital-acetaminophen-caffeine (FIORICET) 50-325-40 MG tablet Take 1 to 2 tablets by mouth twice daily as needed for headache or migraine. 30  tablet 0  . carvedilol (COREG) 12.5 MG tablet Take 1 tablet (12.5 mg total) by mouth 2 (two) times daily with a meal. 180 tablet 3  . digoxin (LANOXIN) 0.125 MG tablet Take 1 tablet (125 mcg total) by mouth daily. 90 tablet 3  . etodolac (LODINE) 400 MG tablet 1 TABLET WITH FOOD ORALLY TWICE A DAY 30 DAY(S) 60 tablet 2  . ferrous sulfate 325 (65 FE) MG tablet Take 1 tablet (325 mg  total) by mouth daily with breakfast. 100 tablet 1  . furosemide (LASIX) 20 MG tablet Take 2 tablets (40 mg total) by mouth daily. 180 tablet 3  . methocarbamol (ROBAXIN) 500 MG tablet Take 1 tablet (500 mg total) by mouth 2 (two) times daily as needed for muscle spasms. 20 tablet 0  . naproxen (NAPROSYN) 500 MG tablet take 1 tablet with food or milk as needed Orally every 12 hrs 60 tablet 2  . ondansetron (ZOFRAN ODT) 4 MG disintegrating tablet Take 1 tablet (4 mg total) by mouth every 8 (eight) hours as needed for nausea or vomiting. (Patient not taking: Reported on 08/06/2020) 20 tablet 0  . ondansetron (ZOFRAN-ODT) 4 MG disintegrating tablet TAKE 1 TAB BY MOUTH EVERY 8 HOURS AS NEEDED FOR NAUSEA OR VOMITING (Patient not taking: Reported on 08/06/2020) 20 tablet 0  . PARoxetine (PAXIL) 30 MG tablet Take 1 tablet (30 mg total) by mouth daily. (Patient not taking: Reported on 08/06/2020) 30 tablet 2  . PARoxetine (PAXIL) 30 MG tablet Take 1 tablet by mouth daily 30 tablet 2  . Potassium Chloride ER 20 MEQ TBCR Take 20 mEq by mouth daily. 90 tablet 3  . predniSONE (DELTASONE) 10 MG tablet Take 1.5 tablets by mouth daily for 4 days, then 1 tablet for 4 days, then resume previous dose Prednisone 10 tablet 0  . predniSONE (DELTASONE) 10 MG tablet TAKE 6 TABS BY MOUTH X 4 DAYS,THEN 4 TABS X 4 DAYS, THEN 2 TABS X 4 DAYS 48 tablet 0  . sacubitril-valsartan (ENTRESTO) 49-51 MG Take 1 tablet by mouth 2 (two) times daily. 180 tablet 3  . spironolactone (ALDACTONE) 25 MG tablet Take 1 tablet (25 mg total) by mouth daily. 90 tablet 3  .  topiramate (TOPAMAX) 25 MG tablet Take 1 tablet by mouth nightly for the first week, then 2 tablets nightly 60 tablet 5  . traZODone (DESYREL) 100 MG tablet Take 1 to 2 tablets at bedtime as needed 60 tablet 2  . Vitamin D, Ergocalciferol, (DRISDOL) 1.25 MG (50000 UNIT) CAPS capsule Take 1 capsule (50,000 Units total) by mouth every 7 (seven) days. 12 capsule 1   No current facility-administered medications for this visit.    Psychiatric Specialty Exam: Review of Systems  There were no vitals taken for this visit.There is no height or weight on file to calculate BMI.  General Appearance: Fairly Groomed  Eye Contact:  Good  Speech:  Clear and Coherent and Normal Rate  Volume:  Normal  Mood:  Euthymic  Affect:  Congruent  Thought Process:  Goal Directed and Descriptions of Associations: Intact  Orientation:  Full (Time, Place, and Person)  Thought Content:  Logical  Suicidal Thoughts:  No  Homicidal Thoughts:  No  Memory:  Immediate;   Good Recent;   Good  Judgement:  Fair  Insight:  Fair  Psychomotor Activity:  Normal  Concentration:  Concentration: Good and Attention Span: Good  Recall:  Good  Fund of Knowledge:Good  Language: Good  Akathisia:  Negative  Handed:  Right  AIMS (if indicated):  Not done due to telemed visit  Assets:  Communication Skills Desire for Improvement Housing Social Support Transportation  ADL's:  Intact  Cognition: WNL  Sleep:  Good   Screenings: AIMS   Flowsheet Row Admission (Discharged) from 12/12/2015 in [facility]  AIMS Total Score 0    AUDIT   Flowsheet Row Admission (Discharged) from 12/12/2015 in [facility]  Alcohol Use Disorder Identification Test Final Score (AUDIT) 0    GAD-7   Flowsheet Row Office Visit from 08/06/2020 in [place] Office Visit from 06/04/2020 in Primary Care at [facility] Visit from 11/19/2017 in [place] Office Visit from 09/18/2016 in [place] Office Visit from 08/21/2016 in [place]  Total GAD-7 Score 0 7 6 8 18     PHQ2-9   [place] Office Visit from 08/06/2020 in [place] Most recent reading at 08/06/2020  8:44 AM Office Visit from 06/04/2020 in Primary Care at [facility] Most recent reading at 06/04/2020 10:23 AM Office Visit from 06/04/2020 in [facility] Most recent reading at 06/04/2020  8:45 AM Office Visit from 11/19/2017 in [place] Most recent reading at 11/19/2017 11:28 AM Office Visit from 09/18/2016 in [place] Most recent reading at 09/18/2016  4:12 PM  PHQ-2 Total Score 0 2 0 2 6  PHQ-9 Total Score 0 8 -- 7 [place] ED from 06/21/2020 in [place] Office Visit from 06/04/2020 in [place] No Risk No Risk      Assessment and Plan: Patient reported being in remission of her depression symptoms.  1. Recurrent major depressive disorder, in full remission ([place])  - PARoxetine (PAXIL) 30 MG tablet; Take 1 tablet by mouth daily  Dispense: 30 tablet; Refill: 2 - traZODone (DESYREL) 100 MG tablet; Take 1 to 2 tablets at bedtime as needed  Dispense: 60 tablet; Refill: 2   Continue same regimen. F/up in 3 months.   [place] Crane, MD 5/20/20228:42 AM

## 2020-08-23 ENCOUNTER — Other Ambulatory Visit: Payer: Self-pay

## 2020-09-14 ENCOUNTER — Ambulatory Visit (INDEPENDENT_AMBULATORY_CARE_PROVIDER_SITE_OTHER): Payer: 59

## 2020-09-14 ENCOUNTER — Ambulatory Visit: Payer: Self-pay

## 2020-09-14 DIAGNOSIS — I428 Other cardiomyopathies: Secondary | ICD-10-CM

## 2020-09-17 LAB — CUP PACEART REMOTE DEVICE CHECK
Battery Remaining Percentage: 55 %
Date Time Interrogation Session: 20220617085000
Implantable Lead Implant Date: 20180627
Implantable Lead Location: 753862
Implantable Lead Model: 3401
Implantable Lead Serial Number: 113145
Implantable Pulse Generator Implant Date: 20180627
Pulse Gen Serial Number: 222625

## 2020-09-23 ENCOUNTER — Other Ambulatory Visit: Payer: Self-pay

## 2020-09-23 ENCOUNTER — Encounter: Payer: Self-pay | Admitting: Hematology

## 2020-09-24 ENCOUNTER — Other Ambulatory Visit: Payer: Self-pay

## 2020-09-24 ENCOUNTER — Telehealth: Payer: Self-pay | Admitting: Internal Medicine

## 2020-09-24 ENCOUNTER — Ambulatory Visit: Payer: Self-pay | Attending: Internal Medicine | Admitting: Internal Medicine

## 2020-09-24 DIAGNOSIS — Z91199 Patient's noncompliance with other medical treatment and regimen due to unspecified reason: Secondary | ICD-10-CM

## 2020-09-24 DIAGNOSIS — Z5329 Procedure and treatment not carried out because of patient's decision for other reasons: Secondary | ICD-10-CM

## 2020-09-24 NOTE — Progress Notes (Signed)
Pt no-showed this telephone appt.

## 2020-09-28 ENCOUNTER — Other Ambulatory Visit: Payer: Self-pay

## 2020-09-28 ENCOUNTER — Ambulatory Visit: Payer: Self-pay

## 2020-10-06 NOTE — Progress Notes (Signed)
Remote ICD transmission.   

## 2020-10-12 ENCOUNTER — Ambulatory Visit: Payer: Self-pay

## 2020-10-12 ENCOUNTER — Ambulatory Visit: Payer: Self-pay | Admitting: *Deleted

## 2020-10-12 ENCOUNTER — Ambulatory Visit
Admission: RE | Admit: 2020-10-12 | Discharge: 2020-10-12 | Disposition: A | Discharge status: Home / Self Care | Payer: No Typology Code available for payment source | Source: Ambulatory Visit | Attending: Obstetrics and Gynecology | Admitting: Obstetrics and Gynecology

## 2020-10-12 ENCOUNTER — Other Ambulatory Visit: Payer: Self-pay

## 2020-10-12 ENCOUNTER — Encounter: Payer: Self-pay | Admitting: Hematology

## 2020-10-12 VITALS — BP 144/100 | Wt 136.1 lb

## 2020-10-12 DIAGNOSIS — N644 Mastodynia: Secondary | ICD-10-CM

## 2020-10-12 DIAGNOSIS — Z1239 Encounter for other screening for malignant neoplasm of breast: Secondary | ICD-10-CM

## 2020-10-12 NOTE — Progress Notes (Addendum)
[name] is a 45 y.o. female who presents to [facility] clinic today with complaint of right outer breast pain x 3-4 months that comes and goes. Patient rates the pain at a 8 out of 10.    Pap Smear: Pap smear not completed today. Last Pap smear was 04/05/2016 at [facility] and was normal with negative HPV. Per patient has no history of an abnormal Pap smear. Last Pap smear result is available in Epic.   Physical exam: Breasts Breasts symmetrical. No skin abnormalities bilateral breasts. No nipple retraction bilateral breasts. No nipple discharge bilateral breasts. No lymphadenopathy. No lumps palpated bilateral breasts. Complaints of right outer breast pain on exam.     MM DIAG BREAST TOMO UNI RIGHT  Result Date: 04/09/2017 CLINICAL DATA:  Possible asymmetry in the outer right breast on the craniocaudal views of a recent 3D baseline screening mammogram. EXAM: 2D DIGITAL DIAGNOSTIC UNILATERAL RIGHT MAMMOGRAM WITH CAD AND ADJUNCT TOMO COMPARISON:  Previous exam(s). ACR Breast Density Category c: The breast tissue is heterogeneously dense, which may obscure small masses. FINDINGS: 2D and 3D tomographic true lateral and spot compression craniocaudal views of the right breast were obtained. These demonstrate normal appearing breast tissue at the location of recently suspected asymmetry. Mammographic images were processed with CAD. IMPRESSION: No evidence of malignancy. The recently suspected right breast asymmetry was close apposition of normal breast tissue. RECOMMENDATION: Bilateral screening mammogram in 1 year. I have discussed the findings and recommendations with the patient. Results were also provided in writing at the conclusion of the visit. If applicable, a reminder letter will be sent to the patient regarding the next appointment. BI-RADS CATEGORY  1: Negative. Electronically Signed   By: Claudie Revering M.D.   On: 04/09/2017 16:02   MM SCREENING  BREAST TOMO BILATERAL  Result Date: 04/06/2017 CLINICAL DATA:  Screening. EXAM: 2D DIGITAL SCREENING BILATERAL MAMMOGRAM WITH 3D TOMO WITH CAD COMPARISON:  None ACR Breast Density Category c: The breast tissue is heterogeneously dense, which may obscure small masses. FINDINGS: In the right breast, a possible asymmetry warrants further evaluation. In the left breast, no findings suspicious for malignancy. Images were processed with CAD. IMPRESSION: Further evaluation is suggested for possible asymmetry in the right breast. RECOMMENDATION: Diagnostic mammogram and possibly ultrasound of the right breast. (Code:[id number]) The patient will be contacted regarding the findings, and additional imaging will be scheduled. BI-RADS CATEGORY  0: Incomplete. Need additional imaging evaluation and/or prior mammograms for comparison. Electronically Signed   By: Dorise Bullion III M.D   On: 04/06/2017 07:50    Pelvic/Bimanual Pap is not indicated today per BCCCP guidelines.   Smoking History: Patient has never smoked.   Patient Navigation: Patient education provided. Access to services provided for patient through [place] program. Spanish interpreter Rudene Anda from [facility] provided. Patient provided bag of groceries from [employer] at the [employer].  Colorectal Cancer Screening: Per patient has had colonoscopy completed on 07/23/2015.  No complaints today.    Breast and Cervical Cancer Risk Assessment: Patient has family history of her sister having breast cancer. Patient has no known genetic mutations or history of radiation treatment to the chest before age 72. Patient does not have history of cervical dysplasia, immunocompromised, or DES exposure in-utero.  Risk Assessment     Risk Scores       10/12/2020   Last edited by: Royston Bake, CMA   5-year risk: 1 %  Lifetime risk: 12 %            A: BCCCP exam without pap smear Complaint of right breast  pain.  P: Referred patient to the [facility] for a diagnostic mammogram. Appointment scheduled Tuesday, October 12, 2020 at [place].  Loletta Parish, RN 10/12/2020 8:41 AM

## 2020-10-12 NOTE — Patient Instructions (Signed)
Explained breast self awareness with Kathy Rosales. Patient did not need a Pap smear today due to last Pap smear and HPV typing was 04/05/2016. Let her know BCCCP will cover Pap smears and HPV typing every 5 years unless has a history of abnormal Pap smears. Referred patient to the [facility] for a diagnostic mammogram. Appointment scheduled Tuesday, October 12, 2020 at [place]. Patient aware of appointment and will be there. Kathy Rosales verbalized understanding.  Kathy Rosales, Arvil Ch[place], RN 8:41 AM

## 2020-10-25 ENCOUNTER — Encounter: Payer: Self-pay | Admitting: Hematology

## 2020-10-25 ENCOUNTER — Other Ambulatory Visit: Payer: Self-pay

## 2020-11-01 ENCOUNTER — Other Ambulatory Visit: Payer: Self-pay

## 2020-11-02 ENCOUNTER — Encounter: Payer: Self-pay | Admitting: Hematology

## 2020-11-02 ENCOUNTER — Other Ambulatory Visit: Payer: Self-pay

## 2020-11-19 ENCOUNTER — Encounter (HOSPITAL_COMMUNITY): Payer: Self-pay | Admitting: Psychiatry

## 2020-11-19 ENCOUNTER — Telehealth (INDEPENDENT_AMBULATORY_CARE_PROVIDER_SITE_OTHER): Payer: No Payment, Other | Admitting: Psychiatry

## 2020-11-19 ENCOUNTER — Encounter: Payer: Self-pay | Admitting: Hematology

## 2020-11-19 ENCOUNTER — Other Ambulatory Visit: Payer: Self-pay

## 2020-11-19 DIAGNOSIS — F3342 Major depressive disorder, recurrent, in full remission: Secondary | ICD-10-CM | POA: Diagnosis not present

## 2020-11-19 MED ORDER — TRAZODONE HCL 100 MG PO TABS
ORAL_TABLET | ORAL | 2 refills | Status: DC
  Filled 2020-11-19: qty 60, fill #0
  Filled 2020-11-29: qty 60, 30d supply, fill #0
  Filled 2021-01-05: qty 60, 30d supply, fill #1
  Filled 2021-02-04: qty 60, 30d supply, fill #2

## 2020-11-19 MED ORDER — PAROXETINE HCL 30 MG PO TABS
30.0000 mg | ORAL_TABLET | Freq: Every day | ORAL | 2 refills | Status: DC
  Filled 2020-11-19 – 2020-11-29 (×2): qty 30, 30d supply, fill #0
  Filled 2020-12-24 – 2021-01-05 (×2): qty 30, 30d supply, fill #1
  Filled 2021-02-04: qty 30, 30d supply, fill #2

## 2020-11-19 NOTE — Progress Notes (Signed)
[place] MD/PA/NP OP Progress Note Virtual Visit via Video Note  I connected with Kathy Rosales on 11/19/20 at  8:30 AM EDT by a video enabled telemedicine application and verified that I am speaking with the correct person using two identifiers.  Location: Patient: Home Provider: Clinic   I discussed the limitations of evaluation and management by telemedicine and the availability of in person appointments. The patient expressed understanding and agreed to proceed.  I provided 30 minutes of non-face-to-face time during this encounter.   11/19/2020 8:53 AM Kathy Rosales [name]  MRN:  [id number]  Chief Complaint: "The med's are very good. I haven't had any problems"  HPI: 45 year old female seen today for follow up psychiatric evaluation. She is a former patient of Dr. Jean Rosenthal who is being transferred to writer for medication management. She has a psychiatric history of Delirium and Depression. She is currently managed on Paxil 30 mg daily and Trazodone 1-2 100 mg tablets nightly as needed. She notes that her medications are effective in managing her psychiatric conditions.  Today provider utilized a Spanish speaking interpreter as this is patients primary language. Today the patient is pleasant, cooperative, and engaged in conversation. She informed Probation officer that over all things are very good and reports that she does not have any problems with her medications. She notes that she has minimal anxiety and depression. Provider conducted a GAD7 and patient scored a 4. Provider also conducted a PHQ 9 and patient scored a 5. She endorses adequate sleep and appetite. Today she denies SI/HI/VAH, mania, or paranoia.  No medication changes made today. Patient agreeable to continue medications as prescribed. No other concerns noted at this time.  Visit Diagnosis:    ICD-10-CM   1. 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[facility])     Past Surgical History:  Procedure Laterality Date   COLONOSCOPY WITH PROPOFOL N/A 07/23/2015   Procedure: COLONOSCOPY WITH PROPOFOL;  Surgeon: Ronald Lobo, MD;  Location: [facility];  Service: Endoscopy;  Laterality: N/A;   ESOPHAGOGASTRODUODENOSCOPY (EGD) WITH PROPOFOL N/A 07/23/2015   Procedure: ESOPHAGOGASTRODUODENOSCOPY (EGD) WITH PROPOFOL;  Surgeon: Ronald Lobo, MD;  Location: [facility];  Service: Endoscopy;  Laterality: N/A;   ESOPHAGOGASTRODUODENOSCOPY (EGD) WITH PROPOFOL N/A 11/18/2015   Procedure: ESOPHAGOGASTRODUODENOSCOPY (EGD) WITH PROPOFOL;  Surgeon: Ronald Lobo, MD;  Location: [facility];  Service: Endoscopy;  Laterality: N/A;   RIGHT/LEFT HEART CATH AND CORONARY ANGIOGRAPHY N/A 05/22/2016   Procedure: Right/Left Heart Cath and Coronary Angiography;  Surgeon: Peter M Martinique, MD;  Location: [facility];  Service: Cardiovascular;  Laterality: N/A;   SUBQ ICD IMPLANT N/A 09/27/2016   Procedure: SubQ ICD Implant;  Surgeon: Evans Lance, MD;  Location: [facility];  Service: Cardiovascular;  Laterality: N/A;    Family Psychiatric History: Denies  Family History:  Family History  Problem Relation Age of Onset   Cancer Paternal Grandfather    Mental illness Neg Hx     Social History:  Social History   Socioeconomic History   Marital status: Single    Spouse name: Not on file   Number of  children: 1   Years of education: Not on file   Highest education level: Not on file  Occupational History   Occupation: unemployeed  Tobacco Use   Smoking status: Never   Smokeless tobacco: Never  Vaping Use   Vaping Use: Never used  Substance and Sexual  Activity   Alcohol use: No   Drug use: No   Sexual activity: Not Currently    Birth control/protection: None  Other Topics Concern   Not on file  Social History Narrative   ** Merged History Encounter **       ** Merged History Encounter **       Social Determinants of Health   Financial Resource Strain: Not on file  Food Insecurity: Food Insecurity Present   Worried About Charity fundraiser in the Last Year: Sometimes true   Ran Out of Food in the Last Year: Sometimes true  Transportation Needs: No Transportation Needs   Lack of Transportation (Medical): No   Lack of Transportation (Non-Medical): No  Physical Activity: Not on file  Stress: Not on file  Social Connections: Not on file    Allergies:  Allergies  Allergen Reactions   Tramadol Itching and Other (See Comments)    Red spots all over body and itching   Furosemide Rash   Leflunomide Rash    Metabolic Disorder Labs: Lab Results  Component Value Date   HGBA1C 5.8 (H) 12/14/2015   MPG 120 12/14/2015   No results found for: PROLACTIN Lab Results  Component Value Date   CHOL 149 12/14/2015   TRIG 180 (H) 12/14/2015   HDL 40 (L) 12/14/2015   CHOLHDL 3.7 12/14/2015   VLDL 36 12/14/2015   LDLCALC 73 12/14/2015   LDLCALC  09/08/2008    83        Total Cholesterol/HDL:CHD Risk Coronary Heart Disease Risk Table                     Men   Women  1/2 Average Risk   3.4   3.3  Average Risk       5.0   4.4  2 X Average Risk   9.6   7.1  3 X Average Risk  23.4   11.0        Use the calculated Patient Ratio above and the CHD Risk Table to determine the patient's CHD Risk.        ATP III CLASSIFICATION (LDL):  <100     mg/dL   Optimal  100-129  mg/dL   Near or Above                    Optimal  130-159  mg/dL   Borderline  160-189  mg/dL   High  >190     mg/dL   Very High   Lab Results  Component Value Date   TSH 2.530 06/09/2019   TSH 1.670 09/18/2017    Therapeutic Level Labs: No results found  for: LITHIUM No results found for: VALPROATE No components found for:  CBMZ  Current Medications: Current Outpatient Medications  Medication Sig Dispense Refill   aspirin EC 81 MG tablet Take 1 tablet (81 mg total) by mouth daily. (Patient not taking: Reported on 10/12/2020) 90 tablet 3   butalbital-acetaminophen-caffeine (FIORICET) 50-325-40 MG tablet TAKE 1-2 TABLETS BY MOUTH 2 (TWO) TIMES DAILY AS NEEDED FOR HEADACHE OR MIGRAINE. 30 tablet 0   butalbital-acetaminophen-caffeine (FIORICET) 50-325-40 MG tablet Take 1 to 2  tablets by mouth twice daily as needed for headache or migraine. 30 tablet 0   carvedilol (COREG) 12.5 MG tablet Take 1 tablet (12.5 mg total) by mouth 2 (two) times daily with a meal. 180 tablet 3   digoxin (LANOXIN) 0.125 MG tablet Take 1 tablet (125 mcg total) by mouth daily. (Patient not taking: Reported on 10/12/2020) 90 tablet 3   etodolac (LODINE) 400 MG tablet 1 TABLET WITH FOOD ORALLY TWICE A DAY 30 DAY(S) 60 tablet 2   ferrous sulfate 325 (65 FE) MG tablet Take 1 tablet (325 mg total) by mouth daily with breakfast. (Patient not taking: Reported on 10/12/2020) 100 tablet 1   furosemide (LASIX) 20 MG tablet Take 2 tablets (40 mg total) by mouth daily. 180 tablet 3   methocarbamol (ROBAXIN) 500 MG tablet Take 1 tablet (500 mg total) by mouth 2 (two) times daily as needed for muscle spasms. 20 tablet 0   naproxen (NAPROSYN) 500 MG tablet take 1 tablet with food or milk as needed Orally every 12 hrs 60 tablet 2   ondansetron (ZOFRAN ODT) 4 MG disintegrating tablet Take 1 tablet (4 mg total) by mouth every 8 (eight) hours as needed for nausea or vomiting. (Patient not taking: No sig reported) 20 tablet 0   ondansetron (ZOFRAN-ODT) 4 MG disintegrating tablet TAKE 1 TAB BY MOUTH EVERY 8 HOURS AS NEEDED FOR NAUSEA OR VOMITING (Patient not taking: No sig reported) 20 tablet 0   PARoxetine (PAXIL) 30 MG tablet Take 1 tablet by mouth daily 30 tablet 2   Potassium Chloride ER 20 MEQ  TBCR Take 20 mEq by mouth daily. 90 tablet 3   predniSONE (DELTASONE) 10 MG tablet Take 1.5 tablets by mouth daily for 4 days, then 1 tablet for 4 days, then resume previous dose Prednisone (Patient not taking: Reported on 10/12/2020) 10 tablet 0   predniSONE (DELTASONE) 10 MG tablet TAKE 6 TABS BY MOUTH X 4 DAYS,THEN 4 TABS X 4 DAYS, THEN 2 TABS X 4 DAYS (Patient not taking: Reported on 10/12/2020) 48 tablet 0   sacubitril-valsartan (ENTRESTO) 49-51 MG Take 1 tablet by mouth 2 (two) times daily. 180 tablet 3   spironolactone (ALDACTONE) 25 MG tablet Take 1 tablet (25 mg total) by mouth daily. 90 tablet 3   topiramate (TOPAMAX) 25 MG tablet Take 1 tablet by mouth nightly for the first week, then 2 tablets nightly 60 tablet 5   traZODone (DESYREL) 100 MG tablet Take 1 to 2 tablets at bedtime as needed 60 tablet 2   Vitamin D, Ergocalciferol, (DRISDOL) 1.25 MG (50000 UNIT) CAPS capsule Take 1 capsule (50,000 Units total) by mouth every 7 (seven) days. (Patient not taking: Reported on 10/12/2020) 12 capsule 1   No current facility-administered medications for this visit.     Musculoskeletal: Strength & Muscle Tone:  Unable to assess due to telehealth visit [place]:  Unable to assess due to telehealth visit Patient leans: N/A  Psychiatric Specialty Exam: Review of Systems  There were no vitals taken for this visit.There is no height or weight on file to calculate BMI.  General Appearance: Well Groomed  Eye Contact:  Good  Speech:  Clear and Coherent and Normal Rate  Volume:  Normal  Mood:  Euthymic  Affect:  Appropriate and Congruent  Thought Process:  Coherent, Goal Directed, and Linear  Orientation:  Full (Time, Place, and Person)  Thought Content: WDL and Logical   Suicidal Thoughts:  No  Homicidal Thoughts:  No  Memory:  Immediate;   Good Recent;   Good Remote;   Good  Judgement:  Good  Insight:  Good  Psychomotor Activity:  Normal  Concentration:  Concentration: Good and  Attention Span: Good  Recall:  Good  Fund of Knowledge: Good  Language: Good  Akathisia:  No  Handed:  Right  AIMS (if indicated): not done  Assets:  Communication Skills Desire for Improvement Financial Resources/Insurance [place] Talents/Skills  ADL's:  Intact  Cognition: WNL  Sleep:  Good   Screenings: AIMS    Flowsheet Row Admission (Discharged) from 12/12/2015 in [facility]  AIMS Total Score 0      AUDIT    Flowsheet Row Admission (Discharged) from 12/12/2015 in [facility]  Alcohol Use Disorder Identification Test Final Score (AUDIT) 0      GAD-7    Flowsheet Row Video Visit from 11/19/2020 in [facility] Office Visit from 08/06/2020 in [place] Visit from 06/04/2020 in [facility] Visit from 11/19/2017 in [place] Office Visit from 09/18/2016 in [place]  Total GAD-7 Score 4 0 '7 6 8      '$ PHQ2-9    Flowsheet Row Video Visit from 11/19/2020 in [facility] Most recent reading at 11/19/2020  8:39 AM Office Visit from 08/06/2020 in [place] Most recent reading at 08/06/2020  8:44 AM Office Visit from 06/04/2020 in [place] [facility] Most recent reading at 06/04/2020 10:23 AM Office Visit from 06/04/2020 in [facility] Most recent reading at 06/04/2020  8:45 AM Office Visit from 11/19/2017 in [place] Most recent reading at 11/19/2017 11:28 AM  PHQ-2 Total Score 2 0 2 0 2  PHQ-9 Total Score 5 0 8 -- 7      Flowsheet Row ED from 06/21/2020 in [place] Office Visit from 06/04/2020 in [place] No Risk No Risk         Assessment and Plan: Patient notes that she is doing well on her current medication regimen. No medication changes made today. Patient agreeable to continue medications as prescribed.   1. Recurrent major depressive disorder, in full remission ([place])  Continue- PARoxetine (PAXIL) 30 MG tablet; Take 1 tablet by mouth daily  Dispense: 30 tablet; Refill: 2 Continue- traZODone (DESYREL) 100 MG tablet; Take 1 to 2 tablets at bedtime as needed  Dispense: 60 tablet; Refill: 2  Follow up in 3 months    Salley Slaughter, NP 11/19/2020, 8:53 AM

## 2020-11-29 ENCOUNTER — Other Ambulatory Visit: Payer: Self-pay

## 2020-11-29 ENCOUNTER — Encounter: Payer: Self-pay | Admitting: Hematology

## 2020-12-14 ENCOUNTER — Ambulatory Visit (INDEPENDENT_AMBULATORY_CARE_PROVIDER_SITE_OTHER): Payer: 59

## 2020-12-14 DIAGNOSIS — I428 Other cardiomyopathies: Secondary | ICD-10-CM

## 2020-12-15 LAB — CUP PACEART REMOTE DEVICE CHECK
Battery Remaining Percentage: 53 %
Date Time Interrogation Session: 20220913083500
Implantable Lead Implant Date: 20180627
Implantable Lead Location: 753862
Implantable Lead Model: 3401
Implantable Lead Serial Number: 113145
Implantable Pulse Generator Implant Date: 20180627
Pulse Gen Serial Number: 222625

## 2020-12-22 NOTE — Progress Notes (Signed)
Remote ICD transmission.   

## 2020-12-24 ENCOUNTER — Encounter: Payer: Self-pay | Admitting: Hematology

## 2020-12-24 ENCOUNTER — Other Ambulatory Visit: Payer: Self-pay

## 2020-12-28 ENCOUNTER — Other Ambulatory Visit: Payer: Self-pay

## 2020-12-31 ENCOUNTER — Other Ambulatory Visit: Payer: Self-pay

## 2021-01-05 ENCOUNTER — Other Ambulatory Visit: Payer: Self-pay

## 2021-01-05 ENCOUNTER — Encounter: Payer: Self-pay | Admitting: Hematology

## 2021-02-04 ENCOUNTER — Encounter: Payer: Self-pay | Admitting: Hematology

## 2021-02-04 ENCOUNTER — Other Ambulatory Visit: Payer: Self-pay

## 2021-02-07 ENCOUNTER — Other Ambulatory Visit (HOSPITAL_BASED_OUTPATIENT_CLINIC_OR_DEPARTMENT_OTHER): Payer: Self-pay

## 2021-02-07 ENCOUNTER — Encounter: Payer: Self-pay | Admitting: Hematology

## 2021-02-07 ENCOUNTER — Other Ambulatory Visit: Payer: Self-pay

## 2021-02-07 MED ORDER — OXYCODONE-ACETAMINOPHEN 5-325 MG PO TABS
ORAL_TABLET | ORAL | 0 refills | Status: DC
  Filled 2021-02-07: qty 40, 10d supply, fill #0

## 2021-02-14 ENCOUNTER — Encounter: Payer: Self-pay | Admitting: Hematology

## 2021-02-14 ENCOUNTER — Other Ambulatory Visit: Payer: Self-pay

## 2021-02-14 MED ORDER — MELOXICAM 7.5 MG PO TABS
ORAL_TABLET | Freq: Two times a day (BID) | ORAL | 0 refills | Status: DC
  Filled 2021-02-14: qty 60, 30d supply, fill #0

## 2021-02-14 MED ORDER — GABAPENTIN 300 MG PO CAPS
300.0000 mg | ORAL_CAPSULE | Freq: Three times a day (TID) | ORAL | 3 refills | Status: DC
  Filled 2021-02-14: qty 90, 30d supply, fill #0

## 2021-02-15 ENCOUNTER — Other Ambulatory Visit: Payer: Self-pay

## 2021-02-18 ENCOUNTER — Telehealth (HOSPITAL_COMMUNITY): Payer: No Payment, Other | Admitting: Psychiatry

## 2021-02-23 ENCOUNTER — Encounter: Payer: Self-pay | Admitting: Hematology

## 2021-02-23 ENCOUNTER — Other Ambulatory Visit (HOSPITAL_BASED_OUTPATIENT_CLINIC_OR_DEPARTMENT_OTHER): Payer: Self-pay

## 2021-02-23 ENCOUNTER — Other Ambulatory Visit: Payer: Self-pay

## 2021-02-23 MED ORDER — ZUBSOLV 5.7-1.4 MG SL SUBL
SUBLINGUAL_TABLET | SUBLINGUAL | 0 refills | Status: DC
  Filled 2021-02-23: qty 15, 15d supply, fill #0
  Filled 2021-03-10: qty 15, 15d supply, fill #1

## 2021-02-25 ENCOUNTER — Other Ambulatory Visit (HOSPITAL_BASED_OUTPATIENT_CLINIC_OR_DEPARTMENT_OTHER): Payer: Self-pay

## 2021-03-02 ENCOUNTER — Encounter: Payer: Self-pay | Admitting: Hematology

## 2021-03-02 ENCOUNTER — Other Ambulatory Visit (HOSPITAL_COMMUNITY): Payer: Self-pay | Admitting: Psychiatry

## 2021-03-02 ENCOUNTER — Other Ambulatory Visit: Payer: Self-pay

## 2021-03-02 DIAGNOSIS — F3342 Major depressive disorder, recurrent, in full remission: Secondary | ICD-10-CM

## 2021-03-02 MED ORDER — TRAZODONE HCL 100 MG PO TABS
ORAL_TABLET | ORAL | 2 refills | Status: DC
  Filled 2021-03-02: qty 60, 30d supply, fill #0

## 2021-03-02 MED ORDER — PAROXETINE HCL 30 MG PO TABS
30.0000 mg | ORAL_TABLET | Freq: Every day | ORAL | 2 refills | Status: DC
  Filled 2021-03-02: qty 30, 30d supply, fill #0

## 2021-03-08 ENCOUNTER — Other Ambulatory Visit (HOSPITAL_COMMUNITY): Payer: Self-pay | Admitting: Psychiatry

## 2021-03-08 ENCOUNTER — Encounter: Payer: Self-pay | Admitting: Hematology

## 2021-03-08 ENCOUNTER — Telehealth (HOSPITAL_COMMUNITY): Payer: Self-pay | Admitting: Psychiatry

## 2021-03-08 ENCOUNTER — Other Ambulatory Visit: Payer: Self-pay

## 2021-03-08 DIAGNOSIS — F3342 Major depressive disorder, recurrent, in full remission: Secondary | ICD-10-CM

## 2021-03-08 MED ORDER — PAROXETINE HCL 30 MG PO TABS
30.0000 mg | ORAL_TABLET | Freq: Every day | ORAL | 3 refills | Status: DC
  Filled 2021-03-08: qty 30, 30d supply, fill #0

## 2021-03-08 MED ORDER — TRAZODONE HCL 100 MG PO TABS
ORAL_TABLET | ORAL | 3 refills | Status: DC
  Filled 2021-03-08: qty 60, 30d supply, fill #0

## 2021-03-08 NOTE — Telephone Encounter (Signed)
Medication refilled and sent to preferred pharmacy

## 2021-03-08 NOTE — Telephone Encounter (Signed)
Patient called for REFILLS TO BE AUTHORIZED: trazadone & paxil Pharmacy :  [facility]  Patient phone number: [phone] Last seen:  11/19/20 COMMENTS:   Next visit 03/30/21 RX Written 03/02/21 but not authorized or sent

## 2021-03-10 ENCOUNTER — Encounter: Payer: Self-pay | Admitting: Hematology

## 2021-03-10 ENCOUNTER — Other Ambulatory Visit (HOSPITAL_BASED_OUTPATIENT_CLINIC_OR_DEPARTMENT_OTHER): Payer: Self-pay

## 2021-03-15 ENCOUNTER — Ambulatory Visit (INDEPENDENT_AMBULATORY_CARE_PROVIDER_SITE_OTHER): Payer: 59

## 2021-03-15 DIAGNOSIS — I428 Other cardiomyopathies: Secondary | ICD-10-CM

## 2021-03-16 LAB — CUP PACEART REMOTE DEVICE CHECK
Battery Remaining Percentage: 50 %
Date Time Interrogation Session: 20221213172900
Implantable Lead Implant Date: 20180627
Implantable Lead Location: 753862
Implantable Lead Model: 3401
Implantable Lead Serial Number: 113145
Implantable Pulse Generator Implant Date: 20180627
Pulse Gen Serial Number: 222625

## 2021-03-21 ENCOUNTER — Other Ambulatory Visit (HOSPITAL_BASED_OUTPATIENT_CLINIC_OR_DEPARTMENT_OTHER): Payer: Self-pay

## 2021-03-21 MED ORDER — ZUBSOLV 5.7-1.4 MG SL SUBL
SUBLINGUAL_TABLET | SUBLINGUAL | 0 refills | Status: DC
  Filled 2021-03-21: qty 60, 30d supply, fill #0
  Filled 2021-03-24: qty 30, 15d supply, fill #0
  Filled 2021-03-24: qty 60, 30d supply, fill #0
  Filled 2021-03-25: qty 10, 5d supply, fill #0
  Filled 2021-03-30: qty 30, 15d supply, fill #0

## 2021-03-24 ENCOUNTER — Encounter: Payer: Self-pay | Admitting: Hematology

## 2021-03-24 ENCOUNTER — Other Ambulatory Visit (HOSPITAL_BASED_OUTPATIENT_CLINIC_OR_DEPARTMENT_OTHER): Payer: Self-pay

## 2021-03-25 ENCOUNTER — Other Ambulatory Visit (HOSPITAL_BASED_OUTPATIENT_CLINIC_OR_DEPARTMENT_OTHER): Payer: Self-pay

## 2021-03-25 NOTE — Progress Notes (Signed)
Remote ICD transmission.   

## 2021-03-29 ENCOUNTER — Encounter: Payer: Self-pay | Admitting: Hematology

## 2021-03-29 ENCOUNTER — Other Ambulatory Visit (HOSPITAL_COMMUNITY): Payer: Self-pay

## 2021-03-29 ENCOUNTER — Other Ambulatory Visit (HOSPITAL_BASED_OUTPATIENT_CLINIC_OR_DEPARTMENT_OTHER): Payer: Self-pay

## 2021-03-29 ENCOUNTER — Other Ambulatory Visit: Payer: Self-pay

## 2021-03-30 ENCOUNTER — Encounter (HOSPITAL_COMMUNITY): Payer: Self-pay | Admitting: Psychiatry

## 2021-03-30 ENCOUNTER — Encounter: Payer: Self-pay | Admitting: Hematology

## 2021-03-30 ENCOUNTER — Other Ambulatory Visit: Payer: Self-pay

## 2021-03-30 ENCOUNTER — Other Ambulatory Visit (HOSPITAL_COMMUNITY): Payer: Self-pay

## 2021-03-30 ENCOUNTER — Telehealth (INDEPENDENT_AMBULATORY_CARE_PROVIDER_SITE_OTHER): Payer: No Payment, Other | Admitting: Psychiatry

## 2021-03-30 ENCOUNTER — Other Ambulatory Visit (HOSPITAL_BASED_OUTPATIENT_CLINIC_OR_DEPARTMENT_OTHER): Payer: Self-pay

## 2021-03-30 DIAGNOSIS — F3342 Major depressive disorder, recurrent, in full remission: Secondary | ICD-10-CM | POA: Diagnosis not present

## 2021-03-30 MED ORDER — BUPRENORPHINE HCL-NALOXONE HCL 8-2 MG SL FILM
1.0000 | ORAL_FILM | Freq: Two times a day (BID) | SUBLINGUAL | 0 refills | Status: DC
  Filled 2021-03-30: qty 60, 30d supply, fill #0

## 2021-03-30 MED ORDER — TRAZODONE HCL 100 MG PO TABS
ORAL_TABLET | ORAL | 3 refills | Status: DC
  Filled 2021-03-30 – 2021-04-08 (×2): qty 60, 30d supply, fill #0
  Filled 2021-05-17: qty 60, 30d supply, fill #1
  Filled 2021-07-01 (×2): qty 60, 30d supply, fill #2

## 2021-03-30 MED ORDER — PAROXETINE HCL 30 MG PO TABS
30.0000 mg | ORAL_TABLET | Freq: Every day | ORAL | 3 refills | Status: DC
  Filled 2021-03-30 – 2021-04-08 (×2): qty 30, 30d supply, fill #0
  Filled 2021-05-17: qty 30, 30d supply, fill #1

## 2021-03-30 MED ORDER — BUPRENORPHINE HCL-NALOXONE HCL 8-2 MG SL FILM
1.0000 | ORAL_FILM | Freq: Two times a day (BID) | SUBLINGUAL | 0 refills | Status: DC
  Filled 2021-03-30 (×2): qty 60, 30d supply, fill #0

## 2021-03-30 MED ORDER — ZUBSOLV 5.7-1.4 MG SL SUBL
1.0000 | SUBLINGUAL_TABLET | Freq: Two times a day (BID) | SUBLINGUAL | 0 refills | Status: DC
  Filled 2021-03-30: qty 60, 30d supply, fill #0

## 2021-03-30 MED ORDER — BUPRENORPHINE HCL-NALOXONE HCL 8-2 MG SL SUBL
1.0000 | SUBLINGUAL_TABLET | Freq: Two times a day (BID) | SUBLINGUAL | 0 refills | Status: DC
  Filled 2021-03-30: qty 60, 30d supply, fill #0

## 2021-03-30 MED ORDER — BUPRENORPHINE HCL-NALOXONE HCL 8-2 MG SL SUBL
1.0000 | SUBLINGUAL_TABLET | Freq: Every day | SUBLINGUAL | 0 refills | Status: DC
  Filled 2021-03-30: qty 60, 60d supply, fill #0

## 2021-03-30 NOTE — Progress Notes (Signed)
Lyle MD/PA/NP OP Progress Note Virtual Visit via Telephone Note  I connected with [name] on 03/30/21 at  1:30 PM EST by telephone and verified that I am speaking with the correct person using two identifiers.  Location: Patient: home Provider: Clinic   I discussed the limitations, risks, security and privacy concerns of performing an evaluation and management service by telephone and the availability of in person appointments. I also discussed with the patient that there may be a patient responsible charge related to this service. The patient expressed understanding and agreed to proceed.   I provided 30 minutes of non-face-to-face time during this encounter.    03/30/2021 1:32 PM [facility] Genova Kiner  MRN:  [id number]  Chief Complaint: "Nothing has changed everything is the same"  HPI: 45 year old female seen today for follow up psychiatric evaluation.  She has a psychiatric history of Delirium and Depression. She is currently managed on Paxil 30 mg daily and Trazodone 1-2 100 mg tablets nightly as needed. She notes that her medications are effective in managing her psychiatric conditions.  Today provider utilized a Spanish speaking interpreter as this is patients primary language. Today the patient is pleasant, cooperative, and engaged in conversation. She informed Probation officer that things are the same and reports that she is doing well. She notes that she feels mentally stable and denies symptoms of anxiety and depression. Today provider conducted a GAD7 and patient scored a 0, at her last visit she scored a 4. Provider also conducted a PHQ 9 and patient scored a 0, at her last visit she scored a 5. She endorses adequate sleep and appetite. Today she denies SI/HI/VAH, mania, or paranoia.  No medication changes made today. Patient agreeable to continue medications as prescribed. No other concerns noted at this time.  Visit Diagnosis:    ICD-10-CM   1. 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[facility])     Past Surgical History:  Procedure Laterality Date   COLONOSCOPY WITH PROPOFOL N/A 07/23/2015   Procedure: COLONOSCOPY WITH PROPOFOL;  Surgeon: Ronald Lobo, MD;  [facility];  Service: Endoscopy;  Laterality: N/A;   ESOPHAGOGASTRODUODENOSCOPY (EGD) WITH PROPOFOL N/A 07/23/2015   Procedure: ESOPHAGOGASTRODUODENOSCOPY (EGD) WITH PROPOFOL;  Surgeon: Ronald Lobo, MD;  [facility];  Service: Endoscopy;  Laterality: N/A;   ESOPHAGOGASTRODUODENOSCOPY (EGD) WITH PROPOFOL N/A 11/18/2015   Procedure: ESOPHAGOGASTRODUODENOSCOPY (EGD) WITH PROPOFOL;  Surgeon: Ronald Lobo, MD;  [facility];  Service: Endoscopy;  Laterality: N/A;   RIGHT/LEFT HEART CATH AND CORONARY ANGIOGRAPHY N/A 05/22/2016   Procedure: Right/Left Heart Cath and Coronary Angiography;  Surgeon: Peter M Martinique, MD;  Location: [facility] CV LAB;  Service: Cardiovascular;  Laterality: N/A;   SUBQ ICD IMPLANT N/A 09/27/2016   Procedure: SubQ ICD Implant;  Surgeon: Evans Lance, MD;  Location: [place] CV LAB;  Service: Cardiovascular;  Laterality: N/A;    Family Psychiatric History: Denies  Family History:  Family History  Problem Relation Age of Onset   Cancer Paternal Grandfather    Mental illness Neg Hx     Social History:  Social History  Socioeconomic History   Marital status: Single    Spouse name: Not on file   Number of children: 1   Years of education: Not on file   Highest education level: Not on file  Occupational History   Occupation: unemployeed  Tobacco Use   Smoking status: Never    Smokeless tobacco: Never  Vaping Use   Vaping Use: Never used  Substance and Sexual Activity   Alcohol use: No   Drug use: No   Sexual activity: Not Currently    Birth control/protection: None  Other Topics Concern   Not on file  Social History Narrative   ** Merged History Encounter **       ** Merged History Encounter **       Social Determinants of Health   Financial Resource Strain: Not on file  Food Insecurity: Food Insecurity Present   Worried About Charity fundraiser in the Last Year: Sometimes true   Ran Out of Food in the Last Year: Sometimes true  Transportation Needs: No Transportation Needs   Lack of Transportation (Medical): No   Lack of Transportation (Non-Medical): No  Physical Activity: Not on file  Stress: Not on file  Social Connections: Not on file    Allergies:  Allergies  Allergen Reactions   Tramadol Itching and Other (See Comments)    Red spots all over body and itching   Furosemide Rash   Leflunomide Rash    Metabolic Disorder Labs: Lab Results  Component Value Date   HGBA1C 5.8 (H) 12/14/2015   MPG 120 12/14/2015   No results found for: PROLACTIN Lab Results  Component Value Date   CHOL 149 12/14/2015   TRIG 180 (H) 12/14/2015   HDL 40 (L) 12/14/2015   CHOLHDL 3.7 12/14/2015   VLDL 36 12/14/2015   LDLCALC 73 12/14/2015   LDLCALC  09/08/2008    83        Total Cholesterol/HDL:CHD Risk Coronary Heart Disease Risk Table                     Men   Women  1/2 Average Risk   3.4   3.3  Average Risk       5.0   4.4  2 X Average Risk   9.6   7.1  3 X Average Risk  23.4   11.0        Use the calculated Patient Ratio above and the CHD Risk Table to determine the patient's CHD Risk.        ATP III CLASSIFICATION (LDL):  <100     mg/dL   Optimal  100-129  mg/dL   Near or Above                    Optimal  130-159  mg/dL   Borderline  160-189  mg/dL   High  >190     mg/dL   Very High   Lab Results  Component Value Date   TSH  2.530 06/09/2019   TSH 1.670 09/18/2017    Therapeutic Level Labs: No results found for: LITHIUM No results found for: VALPROATE No components found for:  CBMZ  Current Medications: Current Outpatient Medications  Medication Sig Dispense Refill   aspirin EC 81 MG tablet Take 1 tablet (81 mg total) by mouth daily. (Patient not taking: Reported on 10/12/2020) 90 tablet 3   Buprenorphine HCl-Naloxone HCl (ZUBSOLV) 5.7-1.4 MG SUBL Place 1 tablet twice a day by sublingual  route. 60 tablet 0   buprenorphine-naloxone (SUBOXONE) 8-2 mg SUBL SL tablet Place 1 tablet under the tongue daily. 60 tablet 0   butalbital-acetaminophen-caffeine (FIORICET) 50-325-40 MG tablet TAKE 1-2 TABLETS BY MOUTH 2 (TWO) TIMES DAILY AS NEEDED FOR HEADACHE OR MIGRAINE. 30 tablet 0   butalbital-acetaminophen-caffeine (FIORICET) 50-325-40 MG tablet Take 1 to 2 tablets by mouth twice daily as needed for headache or migraine. 30 tablet 0   carvedilol (COREG) 12.5 MG tablet Take 1 tablet (12.5 mg total) by mouth 2 (two) times daily with a meal. 180 tablet 3   digoxin (LANOXIN) 0.125 MG tablet Take 1 tablet (125 mcg total) by mouth daily. (Patient not taking: Reported on 10/12/2020) 90 tablet 3   etodolac (LODINE) 400 MG tablet 1 TABLET WITH FOOD ORALLY TWICE A DAY 30 DAY(S) 60 tablet 2   ferrous sulfate 325 (65 FE) MG tablet Take 1 tablet (325 mg total) by mouth daily with breakfast. (Patient not taking: Reported on 10/12/2020) 100 tablet 1   furosemide (LASIX) 20 MG tablet Take 2 tablets (40 mg total) by mouth daily. 180 tablet 3   gabapentin (NEURONTIN) 300 MG capsule Take 1 capsule (300 mg total) by mouth 3 (three) times daily. 90 capsule 3   meloxicam (MOBIC) 7.5 MG tablet Take 1 tablet twice a day by mouth as needed. 60 tablet 0   methocarbamol (ROBAXIN) 500 MG tablet Take 1 tablet (500 mg total) by mouth 2 (two) times daily as needed for muscle spasms. 20 tablet 0   naproxen (NAPROSYN) 500 MG tablet take 1 tablet with food  or milk as needed Orally every 12 hrs 60 tablet 2   ondansetron (ZOFRAN ODT) 4 MG disintegrating tablet Take 1 tablet (4 mg total) by mouth every 8 (eight) hours as needed for nausea or vomiting. (Patient not taking: No sig reported) 20 tablet 0   ondansetron (ZOFRAN-ODT) 4 MG disintegrating tablet TAKE 1 TAB BY MOUTH EVERY 8 HOURS AS NEEDED FOR NAUSEA OR VOMITING (Patient not taking: No sig reported) 20 tablet 0   oxyCODONE-acetaminophen (PERCOCET) 5-325 MG tablet 1 (one) Tablet by mouth four times daily, as needed 40 tablet 0   PARoxetine (PAXIL) 30 MG tablet Take 1 tablet by mouth daily 30 tablet 3   Potassium Chloride ER 20 MEQ TBCR Take 20 mEq by mouth daily. 90 tablet 3   predniSONE (DELTASONE) 10 MG tablet Take 1.5 tablets by mouth daily for 4 days, then 1 tablet for 4 days, then resume previous dose Prednisone (Patient not taking: Reported on 10/12/2020) 10 tablet 0   predniSONE (DELTASONE) 10 MG tablet TAKE 6 TABS BY MOUTH X 4 DAYS,THEN 4 TABS X 4 DAYS, THEN 2 TABS X 4 DAYS (Patient not taking: Reported on 10/12/2020) 48 tablet 0   sacubitril-valsartan (ENTRESTO) 49-51 MG Take 1 tablet by mouth 2 (two) times daily. 180 tablet 3   spironolactone (ALDACTONE) 25 MG tablet Take 1 tablet (25 mg total) by mouth daily. 90 tablet 3   topiramate (TOPAMAX) 25 MG tablet Take 1 tablet by mouth nightly for the first week, then 2 tablets nightly 60 tablet 5   traZODone (DESYREL) 100 MG tablet Take 1 to 2 tablets at bedtime as needed 60 tablet 3   Vitamin D, Ergocalciferol, (DRISDOL) 1.25 MG (50000 UNIT) CAPS capsule Take 1 capsule (50,000 Units total) by mouth every 7 (seven) days. (Patient not taking: Reported on 10/12/2020) 12 capsule 1   No current facility-administered medications for this visit.  Musculoskeletal: Strength & Muscle Tone:  Unable to assess due to telephone visit Gait & Station:  Unable to assess due to telephone visit Patient leans: N/A  Psychiatric Specialty Exam: Review of  Systems  There were no vitals taken for this visit.There is no height or weight on file to calculate BMI.  General Appearance:  Unable to assess due to telephone visit  Eye Contact:   Unable to assess due to telephone visit  Speech:  Clear and Coherent and Normal Rate  Volume:  Normal  Mood:  Euthymic  Affect:  Appropriate and Congruent  Thought Process:  Coherent, Goal Directed, and Linear  Orientation:  Full (Time, Place, and Person)  Thought Content: WDL and Logical   Suicidal Thoughts:  No  Homicidal Thoughts:  No  Memory:  Immediate;   Good Recent;   Good Remote;   Good  Judgement:  Good  Insight:  Good  Psychomotor Activity:  Unable to assess due to telephone visit  Concentration:  Concentration: Good and Attention Span: Good  Recall:  Good  Fund of Knowledge: Good  Language: Good  Akathisia:   Unable to assess due to telephone visit  Handed:  Right  AIMS (if indicated): not done  Assets:  Communication Skills Desire for Improvement Financial Resources/Insurance [place] Talents/Skills  ADL's:  Intact  Cognition: WNL  Sleep:  Good   Screenings: AIMS    Flowsheet Row Admission (Discharged) from 12/12/2015 in [place] Total Score 0      AUDIT    Flowsheet Row Admission (Discharged) from 12/12/2015 in [facility]  Alcohol Use Disorder Identification Test Final Score (AUDIT) 0      GAD-7    Flowsheet Row Video Visit from 03/30/2021 in [facility] Video Visit from 11/19/2020 in [facility] Office Visit from 08/06/2020 in [place] Office Visit from 06/04/2020 in [facility] at [place] Visit from 11/19/2017 in [place]  Total GAD-7 Score 0 4 0 7 6      PHQ2-9    Flowsheet Row Video Visit from 03/30/2021 in [facility] Most recent reading at 03/30/2021  1:23 PM Video Visit from 11/19/2020 in [facility] Most recent reading at 11/19/2020  8:39 AM Office Visit from 08/06/2020 in [place] Most recent reading at 08/06/2020  8:44 AM Office Visit from 06/04/2020 in Primary Care at [facility] Most recent reading at 06/04/2020 10:23 AM Office Visit from 06/04/2020 in [facility] Most recent reading at 06/04/2020  8:45 AM  PHQ-2 Total Score 0 2 0 2 0  PHQ-9 Total Score 0 5 0 8 --      Flowsheet Row ED from 06/21/2020 in [place] Office Visit from 06/04/2020 in [place] No Risk No Risk        Assessment and Plan: Patient notes that she is doing well on her current medication regimen. No medication changes made today. Patient agreeable to continue medications as prescribed.   1. Recurrent major depressive disorder, in full remission ([place])  Continue- PARoxetine (PAXIL) 30 MG tablet; Take 1 tablet by mouth daily  Dispense: 30 tablet; Refill: 3 Continue- traZODone (DESYREL) 100 MG tablet; Take 1 to 2 tablets at bedtime as needed  Dispense: 60 tablet; Refill: 3  Follow up in 3 months    Salley Slaughter, NP 03/30/2021, 1:32 PM

## 2021-03-31 ENCOUNTER — Telehealth: Payer: No Typology Code available for payment source | Admitting: Internal Medicine

## 2021-03-31 ENCOUNTER — Encounter: Payer: Self-pay | Admitting: Hematology

## 2021-04-01 ENCOUNTER — Other Ambulatory Visit: Payer: Self-pay

## 2021-04-08 ENCOUNTER — Encounter: Payer: Self-pay | Admitting: Hematology

## 2021-04-08 ENCOUNTER — Other Ambulatory Visit (HOSPITAL_COMMUNITY): Payer: Self-pay

## 2021-04-25 ENCOUNTER — Other Ambulatory Visit (HOSPITAL_COMMUNITY): Payer: Self-pay

## 2021-04-26 ENCOUNTER — Encounter: Payer: Self-pay | Admitting: Hematology

## 2021-04-26 ENCOUNTER — Other Ambulatory Visit (HOSPITAL_COMMUNITY): Payer: Self-pay

## 2021-04-26 MED ORDER — BUPRENORPHINE HCL-NALOXONE HCL 8-2 MG SL SUBL
1.0000 | SUBLINGUAL_TABLET | Freq: Two times a day (BID) | SUBLINGUAL | 0 refills | Status: DC | PRN
  Filled 2021-04-26 – 2021-05-02 (×2): qty 60, 30d supply, fill #0

## 2021-05-02 ENCOUNTER — Encounter: Payer: Self-pay | Admitting: Hematology

## 2021-05-02 ENCOUNTER — Other Ambulatory Visit (HOSPITAL_COMMUNITY): Payer: Self-pay

## 2021-05-17 ENCOUNTER — Other Ambulatory Visit (HOSPITAL_BASED_OUTPATIENT_CLINIC_OR_DEPARTMENT_OTHER): Payer: Self-pay

## 2021-05-17 ENCOUNTER — Encounter: Payer: Self-pay | Admitting: Hematology

## 2021-05-17 ENCOUNTER — Other Ambulatory Visit: Payer: Self-pay

## 2021-05-23 ENCOUNTER — Encounter: Payer: Self-pay | Admitting: Hematology

## 2021-05-24 ENCOUNTER — Encounter: Payer: Self-pay | Admitting: Nurse Practitioner

## 2021-05-24 ENCOUNTER — Other Ambulatory Visit: Payer: Self-pay

## 2021-05-24 ENCOUNTER — Ambulatory Visit: Payer: Self-pay | Attending: Nurse Practitioner | Admitting: Nurse Practitioner

## 2021-05-24 DIAGNOSIS — G43009 Migraine without aura, not intractable, without status migrainosus: Secondary | ICD-10-CM

## 2021-05-24 MED ORDER — TOPIRAMATE ER 25 MG PO CAP24
25.0000 mg | ORAL_CAPSULE | Freq: Every day | ORAL | 1 refills | Status: DC
  Filled 2021-05-24: qty 30, 30d supply, fill #0

## 2021-05-24 NOTE — Progress Notes (Signed)
Virtual Visit via Telephone Note Due to national recommendations of social distancing due to [place] 19, telehealth visit is felt to be most appropriate for this patient at this time.  I discussed the limitations, risks, security and privacy concerns of performing an evaluation and management service by telephone and the availability of in person appointments. I also discussed with the patient that there may be a patient responsible charge related to this service. The patient expressed understanding and agreed to proceed.    I connected with Kathy Rosales on 05/24/21  at   3:30 PM EST  EDT by telephone and verified that I am speaking with the correct person using two identifiers.  Location of Patient: Private Residence   Location of Provider: [place] [facility] [place] participating in Telemedicine visit: Geryl Rankins FNP-BC [place] [id number]   History of Present Illness: Telemedicine visit for: Headaches  She was prescribed fioricet and topimax by her PCP but states the topimax is no longer effective.  Migraine headaches are occurring daily.  Headaches occur in the bilateral temporal area and described as stabbing. Can last for days. Aggravating factors: PMS, stress. Associated symptoms:  nausea, photophobia.     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[facility])     Past Surgical History:  Procedure Laterality Date   COLONOSCOPY WITH PROPOFOL N/A 07/23/2015   Procedure: COLONOSCOPY WITH PROPOFOL;  Surgeon: Ronald Lobo, MD;  [facility];  Service: Endoscopy;  Laterality: N/A;   ESOPHAGOGASTRODUODENOSCOPY (EGD) WITH PROPOFOL N/A 07/23/2015   Procedure: ESOPHAGOGASTRODUODENOSCOPY (EGD) WITH PROPOFOL;  Surgeon: Ronald Lobo, MD;  [facility];   Service: Endoscopy;  Laterality: N/A;   ESOPHAGOGASTRODUODENOSCOPY (EGD) WITH PROPOFOL N/A 11/18/2015   Procedure: ESOPHAGOGASTRODUODENOSCOPY (EGD) WITH PROPOFOL;  Surgeon: Ronald Lobo, MD;  [facility];  Service: Endoscopy;  Laterality: N/A;   RIGHT/LEFT HEART CATH AND CORONARY ANGIOGRAPHY N/A 05/22/2016   Procedure: Right/Left Heart Cath and Coronary Angiography;  Surgeon: Peter M Martinique, MD;  Location: [place] CV LAB;  Service: Cardiovascular;  Laterality: N/A;   SUBQ ICD IMPLANT N/A 09/27/2016   Procedure: SubQ ICD Implant;  Surgeon: Evans Lance, MD;  Location: [place] CV LAB;  Service: Cardiovascular;  Laterality: N/A;    Family History  Problem Relation Age of Onset   Cancer Paternal Grandfather    Mental illness Neg Hx     Social History   Socioeconomic History   Marital status: Single    Spouse name: Not on file   Number of children: 1   Years of education: Not on file   Highest education level: Not on file  Occupational History   Occupation: unemployeed  Tobacco Use   Smoking status: Never   Smokeless tobacco: Never  Vaping Use   Vaping Use: Never used  Substance and Sexual Activity   Alcohol use: No   Drug use: No   Sexual activity: Not Currently    Birth control/protection: None  Other Topics Concern   Not on file  Social History Narrative   ** Merged History Encounter **       ** Merged History Encounter **       Social Determinants of Health   Financial Resource Strain: Not on file  Food Insecurity: Food Insecurity Present   Worried About Running Out of  Food in the Last Year: Sometimes true   Ran Out of Food in the Last Year: Sometimes true  Transportation Needs: No Transportation Needs   Lack of Transportation (Medical): No   Lack of Transportation (Non-Medical): No  Physical Activity: Not on file  Stress: Not on file  Social Connections: Not on file     Observations/Objective: Awake, alert and oriented x 3   Review of  Systems  Constitutional:  Negative for fever, malaise/fatigue and weight loss.  HENT: Negative.  Negative for nosebleeds.   Eyes: Negative.  Negative for blurred vision, double vision and photophobia.  Respiratory: Negative.  Negative for cough and shortness of breath.   Cardiovascular: Negative.  Negative for chest pain, palpitations and leg swelling.  Gastrointestinal: Negative.  Negative for heartburn, nausea and vomiting.  Musculoskeletal: Negative.  Negative for myalgias.  Neurological:  Positive for headaches. Negative for dizziness, focal weakness and seizures.  Psychiatric/Behavioral: Negative.  Negative for suicidal ideas.    Assessment and Plan: Diagnoses and all orders for this visit:  Migraine without aura and without status migrainosus, not intractable -     Ambulatory referral to Neurology -     Topiramate ER (TROKENDI XR) 25 MG CP24; Take 1 capsule (25 mg total) by mouth daily.     Follow Up Instructions Return if symptoms worsen or fail to improve.     I discussed the assessment and treatment plan with the patient. The patient was provided an opportunity to ask questions and all were answered. The patient agreed with the plan and demonstrated an understanding of the instructions.   The patient was advised to call back or seek an in-person evaluation if the symptoms worsen or if the condition fails to improve as anticipated.  I provided 12 minutes of non-face-to-face time during this encounter including median intraservice time, reviewing previous notes, labs, imaging, medications and explaining diagnosis and management.  Gildardo Pounds, FNP-BC

## 2021-05-25 ENCOUNTER — Other Ambulatory Visit: Payer: Self-pay

## 2021-05-25 ENCOUNTER — Encounter: Payer: Self-pay | Admitting: Hematology

## 2021-05-26 ENCOUNTER — Other Ambulatory Visit: Payer: Self-pay

## 2021-05-26 ENCOUNTER — Encounter: Payer: Self-pay | Admitting: Neurology

## 2021-05-30 ENCOUNTER — Encounter: Payer: Self-pay | Admitting: Hematology

## 2021-05-30 ENCOUNTER — Other Ambulatory Visit (HOSPITAL_COMMUNITY): Payer: Self-pay

## 2021-05-30 MED ORDER — PROMETHAZINE HCL 12.5 MG PO TABS
12.5000 mg | ORAL_TABLET | Freq: Three times a day (TID) | ORAL | 3 refills | Status: DC | PRN
  Filled 2021-05-30: qty 30, 10d supply, fill #0

## 2021-05-31 ENCOUNTER — Other Ambulatory Visit (HOSPITAL_COMMUNITY): Payer: Self-pay

## 2021-05-31 ENCOUNTER — Encounter: Payer: Self-pay | Admitting: Hematology

## 2021-05-31 MED ORDER — BUPRENORPHINE HCL-NALOXONE HCL 8-2 MG SL SUBL
1.0000 | SUBLINGUAL_TABLET | Freq: Two times a day (BID) | SUBLINGUAL | 0 refills | Status: DC | PRN
  Filled 2021-05-31: qty 60, 30d supply, fill #0

## 2021-06-01 NOTE — Progress Notes (Signed)
Cardiology Office Note Date:  06/06/2021  Patient ID:  Kathy Rosales, DOB February 22, 1976, MRN [id number] PCP:  Ladell Pier, MD  Cardiologist:  Dr. Aundra Dubin Electrophysiologist: Dr. Lovena Le    Chief Complaint:  annual visit  History of Present Illness: Kathy Rosales is a 46 y.o. female with history of NICM, migraine HA's, depression   She was admitted for outpatient implantation of a S-ICD in June 2018. She developed in post procedure area AMS/became unresponsive, neurology consult and w/u was undertaken.  The patient had very inconsistent symptoms and observations through the day/evening, CT head, CTA head/neck and EEG were all negative, her symptoms did eventually completely resolve and neurology signed off suspected to have been slow response to anesthesia or psychogenic.  She had hx of a having a CPX 07/15/16. Results as below. Immediately after, pt sat in chair and then had a pseudoseizure. No tonic/clonic activity. Pt taken to ED with Neurology consulted. Pt recovered spontaneously recovered. CT angio chest and CT head  unremarkable.    She comes in today to be seen for dr. Lovena Le, last seen by him Feb 2022, she was doing OK, described class II symptoms, LVEF had some improvement over the years, Last LVEF was 35-40% and recommended she keep her device until ERI  TODAY She is doing OK, though in the last few weeks has been getting fatigued earlier on her walks. Usually does 5 miles every day walking but lately getting tierd at about 2 miles or so. NO trouble with ADLs or usual activities No CP of any kind No rest SOB No near syncope or syncope.  She does not feel like she is retaining water No missed medicines No changes of late with anything She is certain she is not pregnant   Device information: BSCi S-ICD implanted 09/28/16, Dr. 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[facility])     Past Surgical History:  Procedure Laterality Date   COLONOSCOPY WITH PROPOFOL N/A 07/23/2015   Procedure: COLONOSCOPY WITH PROPOFOL;  Surgeon: Ronald Lobo, MD;  Location: [facility];  Service: Endoscopy;  Laterality: N/A;   ESOPHAGOGASTRODUODENOSCOPY (EGD) WITH PROPOFOL N/A 07/23/2015   Procedure: ESOPHAGOGASTRODUODENOSCOPY (EGD) WITH PROPOFOL;  Surgeon: Ronald Lobo, MD;  [facility];  Service: Endoscopy;  Laterality: N/A;   ESOPHAGOGASTRODUODENOSCOPY (EGD) WITH PROPOFOL N/A 11/18/2015   Procedure: ESOPHAGOGASTRODUODENOSCOPY (EGD) WITH PROPOFOL;  Surgeon: Ronald Lobo, MD;  [facility];  Service: Endoscopy;  Laterality: N/A;   RIGHT/LEFT HEART CATH AND CORONARY ANGIOGRAPHY N/A 05/22/2016   Procedure: Right/Left Heart Cath and Coronary Angiography;  Surgeon: Peter M Martinique, MD;  Location: [facility];  Service: Cardiovascular;  Laterality: N/A;   SUBQ ICD IMPLANT N/A 09/27/2016   Procedure: SubQ ICD Implant;  Surgeon: Evans Lance, MD;  Location: [facility];  Service: Cardiovascular;  Laterality: N/A;    Current Outpatient Medications  Medication Sig Dispense Refill   aspirin EC 81 MG tablet Take 1 tablet (81 mg total) by mouth daily. 90 tablet 3   buprenorphine-naloxone (SUBOXONE) 8-2 mg SUBL SL tablet Place 1 tablet under the tongue 2 (two) times daily as needed for chronic pain 60 tablet 0   butalbital-acetaminophen-caffeine (FIORICET) 50-325-40 MG tablet Take 1 to 2 tablets by mouth  twice daily as needed for headache or migraine. 30 tablet 0   carvedilol (COREG) 12.5 MG tablet Take 1 tablet (12.5 mg total) by mouth 2 (two) times daily with a meal. 180 tablet 3   digoxin (LANOXIN) 0.125 MG tablet Take 1 tablet (125 mcg total) by mouth daily. 90 tablet 3    etodolac (LODINE) 400 MG tablet 1 TABLET WITH FOOD ORALLY TWICE A DAY 30 DAY(S) 60 tablet 2   ferrous sulfate 325 (65 FE) MG tablet Take 1 tablet (325 mg total) by mouth daily with breakfast. 100 tablet 1   furosemide (LASIX) 20 MG tablet Take 2 tablets (40 mg total) by mouth daily. 180 tablet 3   gabapentin (NEURONTIN) 300 MG capsule Take 1 capsule (300 mg total) by mouth 3 (three) times daily. 90 capsule 3   meloxicam (MOBIC) 7.5 MG tablet Take 1 tablet twice a day by mouth as needed. 60 tablet 0   methocarbamol (ROBAXIN) 500 MG tablet Take 1 tablet (500 mg total) by mouth 2 (two) times daily as needed for muscle spasms. 20 tablet 0   ondansetron (ZOFRAN ODT) 4 MG disintegrating tablet Take 1 tablet (4 mg total) by mouth every 8 (eight) hours as needed for nausea or vomiting. 20 tablet 0   Potassium Chloride ER 20 MEQ TBCR Take 20 mEq by mouth daily. 90 tablet 3   Topiramate ER (TROKENDI XR) 25 MG CP24 Take 1 capsule (25 mg total) by mouth daily. 30 capsule 1   traZODone (DESYREL) 100 MG tablet Take 1 to 2 tablets at bedtime as needed 60 tablet 3   Vitamin D, Ergocalciferol, (DRISDOL) 1.25 MG (50000 UNIT) CAPS capsule Take 1 capsule (50,000 Units total) by mouth every 7 (seven) days. 12 capsule 1   No current facility-administered medications for this visit.    Allergies:   Tramadol, Furosemide, and Leflunomide   Social History:  The patient  reports that she has never smoked. She has never used smokeless tobacco. She reports that she does not drink alcohol and does not use drugs.   Family History:  The patient's family history includes Cancer in her paternal grandfather.  ROS:  Please see the history of present illness.  All other systems are reviewed and otherwise negative.   PHYSICAL EXAM:  VS:  BP 110/78    Pulse 85    Ht 5' (1.524 m)    Wt 147 lb (66.7 kg)    BMI 28.71 kg/m  BMI: Body mass index is 28.71 kg/m. Well nourished, well developed, in no acute distress  HEENT:  normocephalic, atraumatic  Neck: no JVD, carotid bruits or masses Cardiac:   RRR; no significant murmurs, no rubs, or gallops Lungs:  CTA b/l , no wheezing, rhonchi or rales  Abd: soft, nontender MS: no deformity or atrophy Ext:  no edema  Skin: warm and dry, no rash Neuro:  No gross deficits appreciated Psych: euthymic mood, full affect  S-ICD site is good: no skin changes, thinning, tethering or tenderness.   EKG:  Done today and reviewed by myself  SR 85bpm, no ischemic looking changes   ICD interrogation done today and reviewed by myself:  Battery is 47% (appropriate behavir), lead impedance is 70Ohms, secondary configuration programmed  04/16/2020: TTE IMPRESSIONS   1. Left ventricular ejection fraction, by estimation, is 35 to 40%. The  left ventricle has moderately decreased function. The left ventricle  demonstrates global hypokinesis. The left ventricular internal cavity size  was mildly dilated. Left ventricular  diastolic parameters are consistent with Grade II diastolic dysfunction  (pseudonormalization).   2. Right ventricular systolic function is normal. The right ventricular  size is normal. There is normal pulmonary artery systolic pressure.   3. Left atrial size was mildly dilated.   4. The mitral valve is normal in structure. Trivial mitral valve  regurgitation. No evidence of mitral stenosis.   5. The aortic valve is tricuspid. Aortic valve regurgitation is not  visualized. No aortic stenosis is present.   6. The inferior vena cava is normal in size with greater than 50%  respiratory variability, suggesting right atrial pressure of 3 mmHg.    CPX 07/18/16 Pre-Exercise PFTs  FVC 2.79 (92%)      FEV1 2.38 (93%)        FEV1/FVC 85 (101%)        MVV 48 (49%) Exercise Time:    3:15   Speed (mph): 1.5      Grade (%): 0    RPE: 17 Reason stopped: Patient ended test due to Dyspnea (8/10) Additional symptoms: lightheaded (9/10), chest pressure (9/10) Resting  HR: 84 Peak HR: 95   (53% age predicted max HR) BP rest: 92/62 BP peak: 88/60 Peak VO2: 14.1 (44% predicted peak VO2) VE/VCO2 slope:  59.4 OUES: 0.73 Peak RER: 0.96 Ventilatory Threshold: Not achieved  Peak RR 77 Peak Ventilation:  28.8 VE/MVV:  60% PETCO2 at peak:  28 O2pulse:  8   80% predicted O2pulse)   RHC/LHC 2/19/018  RA 3 PCWP  20 CO/CI 4.43/2.88 LHC normal cors.    12/10/2015 TSH 2.7    ECHO 04/2016 EF 20-25%. Mod-Severe MR, Mod TR.  ECHO 4/18 EF 202-5%, moderate diastolic dysfunction, PASP 31 mmHg.    Recent Labs: 06/08/2020: ALT 14 06/21/2020: BUN 14; Creatinine, Ser 0.52; Hemoglobin 10.5; Platelets 390; Potassium 3.9; Sodium 134  No results found for requested labs within last 8760 hours.   CrCl cannot be calculated (Patient's most recent lab result is older than the maximum 21 days allowed.).   Wt Readings from Last 3 Encounters:  06/06/21 147 lb (66.7 kg)  10/12/20 136 lb 1.6 oz (61.7 kg)  08/06/20 132 lb (59.9 kg)     Other studies reviewed: Additional studies/records reviewed today include: summarized above  ASSESSMENT AND PLAN:  1. NICM      Exam is euvolemic, no symptoms of fluid OL     On BB/entresto, dig, diuretic tx  Labs today, update her echo with some new DOE Pending her echo +/- stress testing  Will get a dig level on the day of her echo, she was advised not to take her dig that AM  2. S-ICD     Intact function, no programming changes     She is up to date with remotes       Disposition: will have her back in 22mo, sooner if needed, pending her echo  Current medicines are reviewed at length with the patient today.  The patient did not have any concerns regarding medicines.  Haywood Lasso, PA-C 06/06/2021 4:39 PM     [facility] [place] [address] [phone] (office)  [phone] (fax)

## 2021-06-02 ENCOUNTER — Other Ambulatory Visit: Payer: Self-pay

## 2021-06-06 ENCOUNTER — Encounter: Payer: Self-pay | Admitting: Hematology

## 2021-06-06 ENCOUNTER — Ambulatory Visit (INDEPENDENT_AMBULATORY_CARE_PROVIDER_SITE_OTHER): Payer: Self-pay | Admitting: Physician Assistant

## 2021-06-06 ENCOUNTER — Other Ambulatory Visit (HOSPITAL_COMMUNITY): Payer: Self-pay

## 2021-06-06 ENCOUNTER — Other Ambulatory Visit: Payer: Self-pay

## 2021-06-06 ENCOUNTER — Encounter: Payer: Self-pay | Admitting: Physician Assistant

## 2021-06-06 VITALS — BP 110/78 | HR 85 | Ht 60.0 in | Wt 147.0 lb

## 2021-06-06 DIAGNOSIS — I428 Other cardiomyopathies: Secondary | ICD-10-CM

## 2021-06-06 DIAGNOSIS — Z79899 Other long term (current) drug therapy: Secondary | ICD-10-CM

## 2021-06-06 DIAGNOSIS — Z95810 Presence of automatic (implantable) cardiac defibrillator: Secondary | ICD-10-CM

## 2021-06-06 DIAGNOSIS — R0600 Dyspnea, unspecified: Secondary | ICD-10-CM

## 2021-06-06 DIAGNOSIS — I5022 Chronic systolic (congestive) heart failure: Secondary | ICD-10-CM

## 2021-06-06 LAB — CUP PACEART INCLINIC DEVICE CHECK
Date Time Interrogation Session: 20230306180705
Implantable Lead Implant Date: 20180627
Implantable Lead Location: 753862
Implantable Lead Model: 3401
Implantable Lead Serial Number: 113145
Implantable Pulse Generator Implant Date: 20180627
Pulse Gen Serial Number: 222625

## 2021-06-06 MED ORDER — ENTRESTO 49-51 MG PO TABS: 1.0000 | ORAL_TABLET | Freq: Two times a day (BID) | ORAL | 3 refills | Status: DC

## 2021-06-06 NOTE — Patient Instructions (Signed)
Medication Instructions:  ? ? ?Your physician recommends that you continue on your current medications as directed. Please refer to the Current Medication list given to you today. ? ? ?*If you need a refill on your cardiac medications before your next appointment, please call your pharmacy* ? ? ?Lab Work:  BMET AND CBC TODAY  ? ?RETURN ON DAY OF ECHOCARDIOGRAM  FOR DIGOXIN LEVEL (HOLD MEDICATION UNTIL AFTER ) ? ? ?If you have labs (blood work) drawn today and your tests are completely normal, you will receive your results only by: ?MyChart Message (if you have MyChart) OR ?A paper copy in the mail ?If you have any lab test that is abnormal or we need to change your treatment, we will call you to review the results. ? ? ?Testing/Procedures: Your physician has requested that you have an echocardiogram. Echocardiography is a painless test that uses sound waves to create images of your heart. It provides your doctor with information about the size and shape of your heart and how well your heart?s chambers and valves are working. This procedure takes approximately one hour. There are no restrictions for this procedure. ? ? ? ? ?Follow-Up: ?At [facility], you and your health needs are our priority.  As part of our continuing mission to provide you with exceptional heart care, we have created designated Provider Care Teams.  These Care Teams include your primary Cardiologist (physician) and Advanced Practice Providers (APPs -  Physician Assistants and Nurse Practitioners) who all work together to provide you with the care you need, when you need it. ? ?We recommend signing up for the patient portal called "MyChart".  Sign up information is provided on this After Visit Summary.  MyChart is used to connect with patients for Virtual Visits (Telemedicine).  Patients are able to view lab/test results, encounter notes, upcoming appointments, etc.  Non-urgent messages can be sent to your provider as well.   ?To learn more  about what you can do with MyChart, go to [email or web address].   ? ?Your next appointment:   ?6 month(s) ? ?The format for your next appointment:   ?In Person ? ?Provider:   ?Tommye Standard, PA-C{ ? ? ? ? ?Other Instructions ? ?

## 2021-06-07 ENCOUNTER — Encounter: Payer: Self-pay | Admitting: Hematology

## 2021-06-07 ENCOUNTER — Other Ambulatory Visit (HOSPITAL_COMMUNITY): Payer: Self-pay

## 2021-06-07 LAB — BASIC METABOLIC PANEL WITH GFR
BUN/Creatinine Ratio: 16 (ref 9–23)
BUN: 10 mg/dL (ref 6–24)
CO2: 22 mmol/L (ref 20–29)
Calcium: 8.9 mg/dL (ref 8.7–10.2)
Chloride: 103 mmol/L (ref 96–106)
Creatinine, Ser: 0.64 mg/dL (ref 0.57–1.00)
Glucose: 91 mg/dL (ref 70–99)
Potassium: 3.6 mmol/L (ref 3.5–5.2)
Sodium: 140 mmol/L (ref 134–144)
eGFR: 111 mL/min/{1.73_m2}

## 2021-06-07 LAB — CBC
Hematocrit: 36.5 % (ref 34.0–46.6)
Hemoglobin: 12.5 g/dL (ref 11.1–15.9)
MCH: 28.7 pg (ref 26.6–33.0)
MCHC: 34.2 g/dL (ref 31.5–35.7)
MCV: 84 fL (ref 79–97)
Platelets: 195 10*3/uL (ref 150–450)
RBC: 4.35 x10E6/uL (ref 3.77–5.28)
RDW: 13.6 % (ref 11.7–15.4)
WBC: 4.1 10*3/uL (ref 3.4–10.8)

## 2021-06-07 MED ORDER — BUPRENORPHINE HCL-NALOXONE HCL 8-2 MG SL SUBL
1.0000 | SUBLINGUAL_TABLET | Freq: Two times a day (BID) | SUBLINGUAL | 0 refills | Status: DC | PRN
  Filled 2021-06-07: qty 60, 30d supply, fill #0

## 2021-06-08 ENCOUNTER — Telehealth: Payer: Self-pay | Admitting: *Deleted

## 2021-06-08 NOTE — Telephone Encounter (Signed)
Lvm of  stable results and clinic number if any questions ?

## 2021-06-14 ENCOUNTER — Ambulatory Visit (INDEPENDENT_AMBULATORY_CARE_PROVIDER_SITE_OTHER): Payer: 59

## 2021-06-14 DIAGNOSIS — I428 Other cardiomyopathies: Secondary | ICD-10-CM

## 2021-06-15 ENCOUNTER — Telehealth (HOSPITAL_COMMUNITY): Payer: No Payment, Other | Admitting: Psychiatry

## 2021-06-15 ENCOUNTER — Encounter (HOSPITAL_COMMUNITY): Payer: Self-pay

## 2021-06-15 LAB — CUP PACEART REMOTE DEVICE CHECK
Battery Remaining Percentage: 46 %
Date Time Interrogation Session: 20230314201700
Implantable Lead Implant Date: 20180627
Implantable Lead Location: 753862
Implantable Lead Model: 3401
Implantable Lead Serial Number: 113145
Implantable Pulse Generator Implant Date: 20180627
Pulse Gen Serial Number: 222625

## 2021-06-17 ENCOUNTER — Ambulatory Visit (HOSPITAL_COMMUNITY): Payer: Self-pay | Attending: Cardiology

## 2021-06-17 ENCOUNTER — Encounter: Payer: Self-pay | Admitting: Hematology

## 2021-06-17 ENCOUNTER — Other Ambulatory Visit: Payer: Self-pay

## 2021-06-17 ENCOUNTER — Other Ambulatory Visit: Payer: No Typology Code available for payment source | Admitting: *Deleted

## 2021-06-17 DIAGNOSIS — Z79899 Other long term (current) drug therapy: Secondary | ICD-10-CM

## 2021-06-17 DIAGNOSIS — R0600 Dyspnea, unspecified: Secondary | ICD-10-CM | POA: Insufficient documentation

## 2021-06-17 LAB — ECHOCARDIOGRAM COMPLETE
Area-P 1/2: 3.95 cm2
P 1/2 time: 594 msec
S' Lateral: 4.5 cm

## 2021-06-20 ENCOUNTER — Telehealth: Payer: Self-pay | Admitting: *Deleted

## 2021-06-20 LAB — DIGOXIN LEVEL: Digoxin, Serum: <0.4 ng/mL — ABNORMAL LOW (ref 0.5–0.9)

## 2021-06-20 NOTE — Telephone Encounter (Signed)
Lvm for patient to call office back for results of echocardiogram and recommendations ?

## 2021-06-20 NOTE — Telephone Encounter (Signed)
-----   Message from [facility] [place] sent at 06/17/2021 10:09 AM EDT ----- ?Echo is unchanged/stable from last few years.  Sounds like she has good exertional capacity, but notice she over due to see Dr. Aundra Dubin.  We can try to increase her Entresto to 97/'103mg'$  BID, but  would like her to see him as well.   ?If she is willing to increase the [facility], please get a BMET in a week ?If she would rather see dr. Aundra Dubin 1st that is OK, but either way, I would like her to get back on track with him. ?

## 2021-06-22 ENCOUNTER — Telehealth (HOSPITAL_COMMUNITY): Payer: Self-pay | Admitting: Family Medicine

## 2021-06-28 NOTE — Progress Notes (Signed)
Remote ICD transmission.   

## 2021-07-01 ENCOUNTER — Encounter: Payer: Self-pay | Admitting: Hematology

## 2021-07-01 ENCOUNTER — Other Ambulatory Visit: Payer: Self-pay

## 2021-07-01 ENCOUNTER — Other Ambulatory Visit (HOSPITAL_COMMUNITY): Payer: Self-pay

## 2021-07-01 MED ORDER — BUPRENORPHINE HCL-NALOXONE HCL 8-2 MG SL SUBL
1.0000 | SUBLINGUAL_TABLET | Freq: Two times a day (BID) | SUBLINGUAL | 0 refills | Status: DC | PRN
  Filled 2021-07-01 – 2021-07-07 (×2): qty 60, 30d supply, fill #0

## 2021-07-04 ENCOUNTER — Other Ambulatory Visit (HOSPITAL_COMMUNITY): Payer: Self-pay

## 2021-07-04 ENCOUNTER — Encounter (HOSPITAL_COMMUNITY): Payer: No Typology Code available for payment source

## 2021-07-06 ENCOUNTER — Encounter: Payer: Self-pay | Admitting: Hematology

## 2021-07-06 ENCOUNTER — Telehealth (INDEPENDENT_AMBULATORY_CARE_PROVIDER_SITE_OTHER): Payer: No Payment, Other | Admitting: Psychiatry

## 2021-07-06 ENCOUNTER — Other Ambulatory Visit: Payer: Self-pay

## 2021-07-06 DIAGNOSIS — F3342 Major depressive disorder, recurrent, in full remission: Secondary | ICD-10-CM

## 2021-07-06 MED ORDER — TRAZODONE HCL 100 MG PO TABS
ORAL_TABLET | ORAL | 3 refills | Status: DC
  Filled 2021-07-06: qty 60, fill #0
  Filled 2021-08-09: qty 60, 30d supply, fill #0
  Filled 2021-09-05: qty 60, 30d supply, fill #1
  Filled 2021-10-06: qty 60, 30d supply, fill #2

## 2021-07-06 MED ORDER — PAROXETINE HCL 10 MG PO TABS
10.0000 mg | ORAL_TABLET | Freq: Every day | ORAL | 2 refills | Status: DC
  Filled 2021-07-06: qty 30, 30d supply, fill #0

## 2021-07-06 MED ORDER — PAROXETINE HCL 20 MG PO TABS
20.0000 mg | ORAL_TABLET | Freq: Every day | ORAL | 2 refills | Status: DC
  Filled 2021-07-06: qty 30, 30d supply, fill #0
  Filled 2021-08-09: qty 30, 30d supply, fill #1
  Filled 2021-09-05: qty 30, 30d supply, fill #2

## 2021-07-06 NOTE — Progress Notes (Signed)
BH MD/PA/NP OP Progress Note ? ?07/06/2021 4:28 PM ?Kathy Rosales  ?MRN:  [id number] ? ?Virtual Visit via Telephone Note ? ?I connected with Kathy Rosales on 07/06/21 at  4:00 PM EDT by telephone and verified that I am speaking with the correct person using two identifiers. ? ?Location: ?Patient: clinic ?Provider: offsite ?  ?I discussed the limitations, risks, security and privacy concerns of performing an evaluation and management service by telephone and the availability of in person appointments. I also discussed with the patient that there may be a patient responsible charge related to this service. The patient expressed understanding and agreed to proceed. ? ? ?  ?I discussed the assessment and treatment plan with the patient. The patient was provided an opportunity to ask questions and all were answered. The patient agreed with the plan and demonstrated an understanding of the instructions. ?  ?The patient was advised to call back or seek an in-person evaluation if the symptoms worsen or if the condition fails to improve as anticipated. ? ?I provided 10 minutes of non-face-to-face time during this encounter. ? ? ?Franne Grip, NP  ? ?Chief Complaint: Medication management ? ?HPI: Kathy Rosales is a 46 year old female presenting to [facility] behavioral health outpatient for follow-up psychiatric evaluation.  She has a psychiatric history of major depressive disorder and her symptoms are managed with Paxil 30 mg daily and trazodone 100 mg 1 to 2 tablets at bedtime as needed for sleep.  Patient reports that she has been without her Paxil for 3 weeks and stated that her pharmacy informed her that Paxil was no longer on the market.  Patient reports being easily irritable, possibly due to not taking her Paxil.  Patient reports that she is able to notice that her mood has been down more due to her not taking her Paxil.  Patient reported that Paxil was effective when previously  taking.  She is open to restarting Paxil today.  Paxil started at 20 mg daily and trazodone refilled at current dosage.  Medication benefits versus risk discussed. ?Patient is alert and oriented x4, calm, pleasant and willing to engage.  She denies suicidal homicidal ideations, paranoia, delusional thought, auditory or visual hallucinations. ? ? ? ?Visit Diagnosis:  ?  ICD-10-CM   ?1. Recurrent major depressive disorder, in full remission ([place])  F33.42   ?  ? ? ?Past Psychiatric History: Major depressive disorder ? ?Past Medical History:  ?Past Medical History:  ?Diagnosis Date  ? Acute systolic CHF (congestive heart failure) ([place]) 05/22/2016  ? Blood transfusion without reported diagnosis   ? 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  ?Past Surgical History:  ?Procedure Laterality Date  ? COLONOSCOPY WITH PROPOFOL N/A 07/23/2015  ? Procedure: COLONOSCOPY WITH PROPOFOL;  Surgeon: Ronald Lobo, MD;  [facility];  Service: Endoscopy;  Laterality: N/A;  ? ESOPHAGOGASTRODUODENOSCOPY (EGD) WITH PROPOFOL N/A 07/23/2015  ? Procedure: ESOPHAGOGASTRODUODENOSCOPY (EGD) WITH PROPOFOL;  Surgeon: Ronald Lobo, MD;  [facility];  Service: Endoscopy;  Laterality: N/A;  ? ESOPHAGOGASTRODUODENOSCOPY (EGD) WITH PROPOFOL N/A 11/18/2015  ? Procedure: ESOPHAGOGASTRODUODENOSCOPY (EGD) WITH PROPOFOL;  Surgeon: Ronald Lobo, MD;  [facility];  Service: Endoscopy;  Laterality: N/A;  ? RIGHT/LEFT HEART CATH AND CORONARY ANGIOGRAPHY N/A 05/22/2016  ? Procedure: Right/Left Heart Cath and Coronary Angiography;  Surgeon: Peter M Martinique, MD;  Location: [place] [facility];  Service: Cardiovascular;  Laterality: N/A;  ? SUBQ ICD IMPLANT N/A 09/27/2016  ? Procedure: SubQ ICD Implant;  Surgeon:  Evans Lance, MD;  Location: [place] [facility];  Service: Cardiovascular;  Laterality: N/A;  ? ? ?Family Psychiatric History: None known ? ?Family History:  ?Family History  ?Problem Relation Age of Onset  ? Cancer Paternal Grandfather   ?  Mental illness Neg Hx   ? ? ?Social History:  ?Social History  ? ?Socioeconomic History  ? Marital status: Single  ?  Spouse name: Not on file  ? Number of children: 1  ? Years of education: Not on file  ? Highest education level: Not on file  ?Occupational History  ? Occupation: unemployeed  ?Tobacco Use  ? Smoking status: Never  ? Smokeless tobacco: Never  ?Vaping Use  ? Vaping Use: Never used  ?Substance and Sexual Activity  ? Alcohol use: No  ? Drug use: No  ? Sexual activity: Not Currently  ?  Birth control/protection: None  ?Other Topics Concern  ? Not on file  ?Social History Narrative  ? ** Merged History Encounter **  ?    ? ** Merged History Encounter **  ?    ? ?Social Determinants of Health  ? ?Financial Resource Strain: Not on file  ?Food Insecurity: Food Insecurity Present  ? Worried About Charity fundraiser in the Last Year: Sometimes true  ? Ran Out of Food in the Last Year: Sometimes true  ?Transportation Needs: No Transportation Needs  ? Lack of Transportation (Medical): No  ? Lack of Transportation (Non-Medical): No  ?Physical Activity: Not on file  ?Stress: Not on file  ?Social Connections: Not on file  ? ? ?Allergies:  ?Allergies  ?Allergen Reactions  ? Tramadol Itching and Other (See Comments)  ?  Red spots all over body and itching  ? Furosemide Rash  ? Leflunomide Rash  ? ? ?Metabolic Disorder Labs: ?Lab Results  ?Component Value Date  ? HGBA1C 5.8 (H) 12/14/2015  ? MPG 120 12/14/2015  ? ?No results found for: PROLACTIN ?Lab Results  ?Component Value Date  ? CHOL 149 12/14/2015  ? TRIG 180 (H) 12/14/2015  ? HDL 40 (L) 12/14/2015  ? CHOLHDL 3.7 12/14/2015  ? VLDL 36 12/14/2015  ? [employer] 73 12/14/2015  ? [place]  09/08/2008  ?  83        ?Total Cholesterol/HDL:CHD Risk ?Coronary Heart Disease Risk Table ?                    Men   Women ? 1/2 Average Risk   3.4   3.3 ? Average Risk       5.0   4.4 ? 2 X Average Risk   9.6   7.1 ? 3 X Average Risk  23.4   11.0 ?       ?Use the calculated  Patient Ratio ?above and the CHD Risk Table ?to determine the patient's CHD Risk. ?       ?ATP III CLASSIFICATION (LDL): ? <100     mg/dL   Optimal ? 100-129  mg/dL   Near or Above ?                   Optimal ? 130-159  mg/dL   Borderline ? 160-189  mg/dL   High ? >190     mg/dL   Very High  ? ?Lab Results  ?Component Value Date  ? TSH 2.530 06/09/2019  ? TSH 1.670 09/18/2017  ? ? ?Therapeutic Level Labs: ?No results found for: LITHIUM ?No results found for: VALPROATE ?  No components found for:  CBMZ ? ?Current Medications: ?Current Outpatient Medications  ?Medication Sig Dispense Refill  ? aspirin EC 81 MG tablet Take 1 tablet (81 mg total) by mouth daily. 90 tablet 3  ? buprenorphine-naloxone (SUBOXONE) 8-2 mg SUBL SL tablet Place 1 tablet under the tongue 2 (two) times daily as needed for chronic pain 60 tablet 0  ? buprenorphine-naloxone (SUBOXONE) 8-2 mg SUBL SL tablet Place 1 tablet under the tongue 2 (two) times daily as needed for chronic pain. 60 tablet 0  ? butalbital-acetaminophen-caffeine (FIORICET) 50-325-40 MG tablet Take 1 to 2 tablets by mouth twice daily as needed for headache or migraine. 30 tablet 0  ? carvedilol (COREG) 12.5 MG tablet Take 1 tablet (12.5 mg total) by mouth 2 (two) times daily with a meal. 180 tablet 3  ? digoxin (LANOXIN) 0.125 MG tablet Take 1 tablet (125 mcg total) by mouth daily. 90 tablet 3  ? ferrous sulfate 325 (65 FE) MG tablet Take 1 tablet (325 mg total) by mouth daily with breakfast. 100 tablet 1  ? furosemide (LASIX) 20 MG tablet Take 2 tablets (40 mg total) by mouth daily. 180 tablet 3  ? gabapentin (NEURONTIN) 300 MG capsule Take 1 capsule (300 mg total) by mouth 3 (three) times daily. 90 capsule 3  ? meloxicam (MOBIC) 7.5 MG tablet Take 1 tablet twice a day by mouth as needed. 60 tablet 0  ? methocarbamol (ROBAXIN) 500 MG tablet Take 1 tablet (500 mg total) by mouth 2 (two) times daily as needed for muscle spasms. 20 tablet 0  ? ondansetron (ZOFRAN ODT) 4 MG  disintegrating tablet Take 1 tablet (4 mg total) by mouth every 8 (eight) hours as needed for nausea or vomiting. 20 tablet 0  ? Potassium Chloride ER 20 MEQ TBCR Take 20 mEq by mouth daily. 90 tablet 3  ? sacubitril-va

## 2021-07-07 ENCOUNTER — Encounter: Payer: Self-pay | Admitting: Hematology

## 2021-07-07 ENCOUNTER — Other Ambulatory Visit (HOSPITAL_COMMUNITY): Payer: Self-pay

## 2021-07-07 ENCOUNTER — Other Ambulatory Visit: Payer: Self-pay

## 2021-07-08 ENCOUNTER — Telehealth (HOSPITAL_COMMUNITY): Payer: Self-pay | Admitting: Pharmacy Technician

## 2021-07-08 ENCOUNTER — Encounter: Payer: Self-pay | Admitting: Hematology

## 2021-07-08 ENCOUNTER — Other Ambulatory Visit (HOSPITAL_COMMUNITY): Payer: Self-pay

## 2021-07-08 ENCOUNTER — Ambulatory Visit (HOSPITAL_COMMUNITY)
Admission: RE | Admit: 2021-07-08 | Discharge: 2021-07-08 | Disposition: A | Discharge status: Home / Self Care | Payer: Self-pay | Source: Ambulatory Visit | Attending: Family Medicine | Admitting: Family Medicine

## 2021-07-08 ENCOUNTER — Encounter (HOSPITAL_COMMUNITY): Payer: Self-pay

## 2021-07-08 VITALS — BP 120/80 | HR 77 | Wt 147.4 lb

## 2021-07-08 DIAGNOSIS — I081 Rheumatic disorders of both mitral and tricuspid valves: Secondary | ICD-10-CM | POA: Insufficient documentation

## 2021-07-08 DIAGNOSIS — Z597 Insufficient social insurance and welfare support: Secondary | ICD-10-CM | POA: Insufficient documentation

## 2021-07-08 DIAGNOSIS — Z79899 Other long term (current) drug therapy: Secondary | ICD-10-CM | POA: Insufficient documentation

## 2021-07-08 DIAGNOSIS — I428 Other cardiomyopathies: Secondary | ICD-10-CM | POA: Insufficient documentation

## 2021-07-08 DIAGNOSIS — Z95810 Presence of automatic (implantable) cardiac defibrillator: Secondary | ICD-10-CM

## 2021-07-08 DIAGNOSIS — I340 Nonrheumatic mitral (valve) insufficiency: Secondary | ICD-10-CM

## 2021-07-08 DIAGNOSIS — I5022 Chronic systolic (congestive) heart failure: Secondary | ICD-10-CM

## 2021-07-08 DIAGNOSIS — Z139 Encounter for screening, unspecified: Secondary | ICD-10-CM

## 2021-07-08 DIAGNOSIS — Z7984 Long term (current) use of oral hypoglycemic drugs: Secondary | ICD-10-CM | POA: Insufficient documentation

## 2021-07-08 LAB — BASIC METABOLIC PANEL
Anion gap: 5 (ref 5–15)
BUN: 10 mg/dL (ref 6–20)
CO2: 23 mmol/L (ref 22–32)
Calcium: 8.2 mg/dL — ABNORMAL LOW (ref 8.9–10.3)
Chloride: 107 mmol/L (ref 98–111)
Creatinine, Ser: 0.59 mg/dL (ref 0.44–1.00)
GFR, Estimated: >60 mL/min (ref >60)
Glucose, Bld: 95 mg/dL (ref 70–99)
Potassium: 3.7 mmol/L (ref 3.5–5.1)
Sodium: 135 mmol/L (ref 135–145)

## 2021-07-08 LAB — HEMOGLOBIN A1C
Hgb A1c MFr Bld: 5.5 % (ref 4.8–5.6)
Mean Plasma Glucose: 111.15 mg/dL

## 2021-07-08 MED ORDER — DAPAGLIFLOZIN PROPANEDIOL 10 MG PO TABS
10.0000 mg | ORAL_TABLET | Freq: Every day | ORAL | 6 refills | Status: DC
  Filled 2021-07-08: qty 30, 30d supply, fill #0

## 2021-07-08 NOTE — Progress Notes (Signed)
?  Heart and Vascular Care Navigation ? ?07/08/2021 ? ?[name] Lyndal Pulley ?11-11-1975 ?[id number] ? ?Reason for Referral: lack of insurance ?  ?Engaged with patient face to face for initial visit for Heart and Vascular Care Coordination. ?                                                                                                  ?Assessment:   CSW met with pt to discuss options for medical coverage.  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  It is outside open enrollment so not able ot pursue ACA insurance. ? ?CSW provided with CAFA application which pt has completed in the past to help with cone bills.  Also provided with [facility] Card application to assist with seeing other community providers.                                  ? ?HRT/VAS Care Coordination   ? ? Home Assistive Devices/Equipment None  ? ?  ? ? ?Social History:                                                                             ?SDOH Screenings  ? ?Alcohol Screen: Not on file  ?Depression (PHQ2-9): Low Risk   ? PHQ-2 Score: 0  ?Financial Resource Strain: High Risk  ? Difficulty of Paying Living Expenses: Hard  ?Food Insecurity: Food Insecurity Present  ? Worried About Charity fundraiser in the Last Year: Sometimes true  ? Ran Out of Food in the Last Year: Sometimes true  ?Housing: Not on file  ?Physical Activity: Not on file  ?Social Connections: Not on file  ?Stress: Not on file  ?Tobacco Use: Low Risk   ? Smoking Tobacco Use: Never  ? Smokeless Tobacco Use: Never  ? Passive Exposure: Not on file  ?Transportation Needs: No Transportation Needs  ? Lack of Transportation (Medical): No  ? Lack of Transportation (Non-Medical): No  ? ? ?SDOH Interventions: ?Financial Resources:  Financial Strain Interventions: Other (Comment) (CAFA and [employer]) ?Financial Counseling for [employer] [facility]  ?Food Insecurity:   Not assessed  ?Housing Insecurity:  Not assessed   ?Transportation:   Not assessed  ? ?Follow-up plan:   ? ?Pt to complete applications and turn in to get coverage for cone bills ? ?Jorge Ny, LCSW ?Clinical Social Worker ?[facility] ?Desk#: [phone] ?Cell#: [phone] ? ? ? ?

## 2021-07-08 NOTE — Progress Notes (Addendum)
? ?  ?Advanced Heart Failure Clinic Note  ? ? ?PCP: Ladell Pier, MD ?Primary Cardiologist: Dr. Stanford Breed  ?HF MD: Dr Aundra Dubin  ? ?HPI:  ?Kathy Rosales is a 46 y.o. female with a past medical history of NICM EF 25-30% (cath in 05/2016 with normal cors), moderate mitral regurgitation, and tricuspid regurgitation. No FH of coronary disease.  ? ?Diagnosed with CHF and  went to her PCP in January 2018 for dyspnea. Says she started getting SOB in the fall. Denies any type of viral illness. No family history of heart disease. Echo was performed and showed reduced EF 20-25% with mod-severe MR. at 25-30%. She was referred to Dr. Stanford Breed who then set her up for a left and right heart cath. Admitted over night on 2/19 after cath due to changes in LOC after cath, was hypotensive. Symptoms resolved, head CT negative. Optimization of her medications was prohibited by hypotension prior to discharge. Started on corlanor.  ? ?Had CPX 07/15/16. Results as below. Immediately after, pt sat in chair and then had a pseudoseizure. No tonic/clonic activity. Pt taken to ED with Neurology consulted. Pt recovered spontaneously recovered. CT angio chest and CT head unremarkable.  Repeat echo was done in 4/18.  EF remained 20-25% with moderate diastolic dysfunction.  ? ?She is s/p [employer] SubQ ICD implantation 09/27/16. Admission complicated by AMS thought to be psychogenic. CT head, CTA head/neck and EEG were all negative.  Echo in 2/19 showed EF 35-40%.   ? ?Lost to follow up. Last seen 05/2018.  ? ?She followed up with EP 3/23 and endorsed new DOE. Echo arranged, showing EF 35-40%, LV function moderately decreased with LV global HK and grade II DD, normal RV. Entresto increased. ? ?Today she returns for HF follow up, here with interpretor. Overall feeling fine. She walks 1.5 hrs/5 times a week. Denies palpitations, abnormal bleeding, CP, dizziness, edema, or PND/Orthopnea. Appetite ok. No fever or chills. Weight  at home 147 pounds. Taking all medications. ? ?ECG (personally reviewed, from 06/06/21): NSR, 85 bpm ? ?Labs (6/18): K 3.5, creatinine 0.71, hgb 13.1 ?Labs (1/19): digoxin < 0.2 ?Labs (3/19): K 3.1, creatinine 0.63 ?Labs (3/23): K 3.6, creatinine 0.64, digoxin 0.4 ? ?PMH: ?1. Chronic systolic CHF: Nonischemic cardiomyopathy.  ?- LHC/RHC (2/18) with no significant coronary disease. Mean RA 3, mean PCWP 20, CI 2.88.  ?- Echo (1/18) with EF 20-25%, moderate-severe MR.   ?- CPX (4/18): peak VO2 14.1, VE/VCO2 slope 59, RER 0.96 (submaximal).  ?- Echo (4/18): EF 20-25%.  ?- [employer] subcutaneous ICD ?- Echo (2/19): EF 35-40%, mild MR.  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  ?3. H/o Still's disease.  ? ?SH: Lives with Mom. No alcohol or drug abuse. Does not smoke cigarettes ? ?FH: No history of CAD or cardiomyopathy.   ?  ?Review of systems complete and found to be negative unless listed in HPI.   ? ?Current Outpatient Medications  ?Medication Sig Dispense Refill  ? aspirin EC 81 MG tablet Take 1 tablet (81 mg total) by mouth daily. 90 tablet 3  ? buprenorphine-naloxone (SUBOXONE) 8-2 mg SUBL SL tablet Place 1 tablet under the tongue 2 (two) times daily as needed for chronic pain 60 tablet 0  ? buprenorphine-naloxone (SUBOXONE) 8-2 mg SUBL SL tablet Place 1 tablet under the tongue 2 (two) times daily as needed for chronic pain. 60 tablet 0  ? butalbital-acetaminophen-caffeine (FIORICET) 50-325-40 MG tablet Take 1 to 2 tablets by mouth twice daily as needed for  headache or migraine. 30 tablet 0  ? carvedilol (COREG) 12.5 MG tablet Take 1 tablet (12.5 mg total) by mouth 2 (two) times daily with a meal. 180 tablet 3  ? digoxin (LANOXIN) 0.125 MG tablet Take 1 tablet (125 mcg total) by mouth daily. 90 tablet 3  ? ferrous sulfate 325 (65 FE) MG tablet Take 1 tablet (325 mg total) by mouth daily with breakfast. 100 tablet 1  ? furosemide (LASIX) 20 MG tablet Take 2 tablets (40 mg total) by mouth daily. 180 tablet 3  ?  methocarbamol (ROBAXIN) 500 MG tablet Take 1 tablet (500 mg total) by mouth 2 (two) times daily as needed for muscle spasms. 20 tablet 0  ? ondansetron (ZOFRAN ODT) 4 MG disintegrating tablet Take 1 tablet (4 mg total) by mouth every 8 (eight) hours as needed for nausea or vomiting. 20 tablet 0  ? PARoxetine (PAXIL) 20 MG tablet Take 1 tablet (20 mg total) by mouth daily. 30 tablet 2  ? Potassium Chloride ER 20 MEQ TBCR Take 20 mEq by mouth daily. 90 tablet 3  ? sacubitril-valsartan (ENTRESTO) 49-51 MG Take 1 tablet by mouth 2 (two) times daily. 180 tablet 3  ? Topiramate ER (TROKENDI XR) 25 MG CP24 Take 1 capsule (25 mg total) by mouth daily. 30 capsule 1  ? traZODone (DESYREL) 100 MG tablet Take 1 to 2 tablets at bedtime as needed 60 tablet 3  ? Vitamin D, Ergocalciferol, (DRISDOL) 1.25 MG (50000 UNIT) CAPS capsule Take 1 capsule (50,000 Units total) by mouth every 7 (seven) days. 12 capsule 1  ? ?No current facility-administered medications for this encounter.  ? ?BP 120/80   Pulse 77   Wt 66.9 kg (147 lb 6.4 oz)   SpO2 97%   BMI 28.79 kg/m?  ? ?Wt Readings from Last 3 Encounters:  ?07/08/21 66.9 kg (147 lb 6.4 oz)  ?06/06/21 66.7 kg (147 lb)  ?10/12/20 61.7 kg (136 lb 1.6 oz)  ? ?Physical Exam: ?General:  NAD. No resp difficulty ?HEENT: Normal ?Neck: Supple. No JVD. Carotids 2+ bilat; no bruits. No lymphadenopathy or thryomegaly appreciated. ?Cor: PMI nondisplaced. Regular rate & rhythm. No rubs, gallops or murmurs. ?Lungs: Clear ?Abdomen: Soft, nontender, nondistended. No hepatosplenomegaly. No bruits or masses. Good bowel sounds. ?Extremities: No cyanosis, clubbing, rash, edema ?Neuro: Alert & oriented x 3, cranial nerves grossly intact. Moves all 4 extremities w/o difficulty. Affect pleasant. ? ?ASSESSMENT & PLAN: ?1. Chronic systolic CHF: Nonischemic cardiomyopathy,  LHC 05/2016 no coronary disease.  ?Viral myocarditis as trigger.  [place] subcutaneous ICD.  Echo in 2/19 with EF 35-40%, mild MR.  Echo 3/23 with EF stable at 35-40%. NYHA class I-II symptoms.  Not volume overloaded on exam.  ?- Start Farxiga 10 mg daily. BMET & A1c today. HF fund ?- Start spiro next. ?- Continue Lasix 40 mg daily for now, plan to reduce at next visit.  ?- Continue Coreg 12.5 mg bid.  ?- Continue Entresto 49/51 mg bid. Pt assistance started. ?- Continue digoxin 0.125 mg daily. Recent level ok, 0.4 3/23. ?2. Mitral regurgitation:  Mild on Echo 3/23. ?3. SDOH: uninsured. Engage HFSW.  ? ?Follow up with APP in 4-6 weeks (start spiro & reduce Lasix) and 3 months with Dr. Aundra Dubin. ? ?Rafael Bihari, FNP  ?07/08/2021  ? ?

## 2021-07-08 NOTE — Patient Instructions (Addendum)
Thank you for coming in today ? ?Labs were done today, if any labs are abnormal the clinic will call you ?No news is good news  ? ?START Farxiga 10 mg 1 tablet daily  ? ?Your physician recommends that you schedule a follow-up appointment in:  ?4 weeks in clinic ?PLEASE Call in MAY to make appointment for July 2023 ? ?At the [facility], you and your health needs are our priority. As part of our continuing mission to provide you with exceptional heart care, we have created designated Provider Care Teams. These Care Teams include your primary Cardiologist (physician) and Advanced Practice Providers (APPs- Physician Assistants and Nurse Practitioners) who all work together to provide you with the care you need, when you need it.  ? ?You may see any of the following providers on your designated Care Team at your next follow up: ?Dr Glori Bickers ?Dr Loralie Champagne ?Darrick Grinder, NP ?Lyda Jester, PA ?Jessica Milford,NP ?Marlyce Huge, PA ?Audry Riles, PharmD ? ? ?Please be sure to bring in all your medications bottles to every appointment.  ? ?If you have any questions or concerns before your next appointment please send [place] a message through [place] or call our office at [phone].   ? ?TO LEAVE A MESSAGE FOR THE NURSE SELECT OPTION 2, PLEASE LEAVE A MESSAGE INCLUDING: ?YOUR NAME ?DATE OF BIRTH ?CALL BACK NUMBER ?REASON FOR CALL**this is important as we prioritize the call backs ? ?YOU WILL RECEIVE A CALL BACK THE SAME DAY AS LONG AS YOU CALL BEFORE 4:00 PM ? ?

## 2021-07-08 NOTE — Telephone Encounter (Signed)
Advanced Heart Failure Patient Advocate Encounter ? ?Patient was seen in clinic today and cannot afford Entresto. The patient is currently uninsured. Started an [place] for [employer] assistance. Patient is aware that POI is required to gain approval.  ?

## 2021-08-05 ENCOUNTER — Other Ambulatory Visit (HOSPITAL_COMMUNITY): Payer: Self-pay

## 2021-08-05 ENCOUNTER — Encounter (HOSPITAL_COMMUNITY): Payer: Self-pay

## 2021-08-05 ENCOUNTER — Ambulatory Visit (HOSPITAL_COMMUNITY)
Admission: RE | Admit: 2021-08-05 | Discharge: 2021-08-05 | Disposition: A | Discharge status: Home / Self Care | Payer: Self-pay | Source: Ambulatory Visit | Attending: Family Medicine | Admitting: Family Medicine

## 2021-08-05 ENCOUNTER — Encounter: Payer: Self-pay | Admitting: Hematology

## 2021-08-05 ENCOUNTER — Other Ambulatory Visit: Payer: Self-pay

## 2021-08-05 VITALS — BP 114/80 | HR 66 | Wt 142.0 lb

## 2021-08-05 DIAGNOSIS — Z79899 Other long term (current) drug therapy: Secondary | ICD-10-CM | POA: Insufficient documentation

## 2021-08-05 DIAGNOSIS — I5022 Chronic systolic (congestive) heart failure: Secondary | ICD-10-CM

## 2021-08-05 DIAGNOSIS — Z139 Encounter for screening, unspecified: Secondary | ICD-10-CM

## 2021-08-05 DIAGNOSIS — I340 Nonrheumatic mitral (valve) insufficiency: Secondary | ICD-10-CM

## 2021-08-05 DIAGNOSIS — I428 Other cardiomyopathies: Secondary | ICD-10-CM

## 2021-08-05 DIAGNOSIS — Z5989 Other problems related to housing and economic circumstances: Secondary | ICD-10-CM | POA: Insufficient documentation

## 2021-08-05 DIAGNOSIS — Z95810 Presence of automatic (implantable) cardiac defibrillator: Secondary | ICD-10-CM | POA: Insufficient documentation

## 2021-08-05 DIAGNOSIS — Z7982 Long term (current) use of aspirin: Secondary | ICD-10-CM | POA: Insufficient documentation

## 2021-08-05 DIAGNOSIS — Z7984 Long term (current) use of oral hypoglycemic drugs: Secondary | ICD-10-CM | POA: Insufficient documentation

## 2021-08-05 LAB — BASIC METABOLIC PANEL
Anion gap: 5 (ref 5–15)
BUN: 11 mg/dL (ref 6–20)
CO2: 22 mmol/L (ref 22–32)
Calcium: 8.6 mg/dL — ABNORMAL LOW (ref 8.9–10.3)
Chloride: 110 mmol/L (ref 98–111)
Creatinine, Ser: 0.63 mg/dL (ref 0.44–1.00)
GFR, Estimated: >60 mL/min (ref >60)
Glucose, Bld: 88 mg/dL (ref 70–99)
Potassium: 4 mmol/L (ref 3.5–5.1)
Sodium: 137 mmol/L (ref 135–145)

## 2021-08-05 LAB — DIGOXIN LEVEL: Digoxin Level: <0.2 ng/mL — ABNORMAL LOW (ref 0.8–2.0)

## 2021-08-05 MED ORDER — FUROSEMIDE 20 MG PO TABS
40.0000 mg | ORAL_TABLET | ORAL | 2 refills | Status: DC | PRN
  Filled 2021-08-05: qty 60, 30d supply, fill #0

## 2021-08-05 MED ORDER — BUPRENORPHINE HCL-NALOXONE HCL 8-2 MG SL SUBL
1.0000 | SUBLINGUAL_TABLET | Freq: Two times a day (BID) | SUBLINGUAL | 0 refills | Status: DC | PRN
  Filled 2021-08-05: qty 30, 15d supply, fill #0
  Filled 2021-09-05: qty 30, 15d supply, fill #1

## 2021-08-05 NOTE — Addendum Note (Signed)
Encounter addended by: Rafael Bihari, FNP on: 08/05/2021 11:07 AM ? Actions taken: Clinical Note Signed

## 2021-08-05 NOTE — Patient Instructions (Addendum)
Thank you for coming in today ? ?Labs were done today, if any labs are abnormal the clinic will call you ?No news is good news ? ?CHANGE Lasix to 40 mg as needed daily For weight gain of 3 lbs in 24 hours or 5 lbs in a week ? ?Your physician recommends that you schedule a follow-up appointment in:  ?4 weeks in clinic  ?3 months with Dr. Aundra Dubin ? ?At the [facility], you and your health needs are our priority. As part of our continuing mission to provide you with exceptional heart care, we have created designated Provider Care Teams. These Care Teams include your primary Cardiologist (physician) and Advanced Practice Providers (APPs- Physician Assistants and Nurse Practitioners) who all work together to provide you with the care you need, when you need it.  ? ?You may see any of the following providers on your designated Care Team at your next follow up: ?Dr Glori Bickers ?Dr Loralie Champagne ?Darrick Grinder, NP ?Lyda Jester, PA ?Jessica Milford,NP ?Marlyce Huge, PA ?Audry Riles, PharmD ? ? ?Please be sure to bring in all your medications bottles to every appointment.  ? ?If you have any questions or concerns before your next appointment please send [place] a message through [place] or call our office at [phone].   ? ?TO LEAVE A MESSAGE FOR THE NURSE SELECT OPTION 2, PLEASE LEAVE A MESSAGE INCLUDING: ?YOUR NAME ?DATE OF BIRTH ?CALL BACK NUMBER ?REASON FOR CALL**this is important as we prioritize the call backs ? ?YOU WILL RECEIVE A CALL BACK THE SAME DAY AS LONG AS YOU CALL BEFORE 4:00 PM ? ? ? ?

## 2021-08-05 NOTE — Progress Notes (Addendum)
?  ?Advanced Heart Failure Clinic Note  ? ? ?PCP: Ladell Pier, MD ?Primary Cardiologist: Dr. Stanford Breed  ?HF Cardiologist: Dr Aundra Dubin  ? ?HPI:  ?Kathy Rosales is a 46 y.o. female with a past medical history of NICM EF 25-30% (cath in 05/2016 with normal cors), moderate mitral regurgitation, and tricuspid regurgitation. No FH of coronary disease.  ? ?Diagnosed with CHF and  went to her PCP in January 2018 for dyspnea. Says she started getting SOB in the fall. Denies any type of viral illness. No family history of heart disease. Echo was performed and showed reduced EF 20-25% with mod-severe MR. at 25-30%. She was referred to Dr. Stanford Breed who then set her up for a left and right heart cath. Admitted over night on 2/19 after cath due to changes in LOC after cath, was hypotensive. Symptoms resolved, head CT negative. Optimization of her medications was prohibited by hypotension prior to discharge. Started on corlanor.  ? ?Had CPX 07/15/16. Results as below. Immediately after, pt sat in chair and then had a pseudoseizure. No tonic/clonic activity. Pt taken to ED with Neurology consulted. Pt recovered spontaneously recovered. CT angio chest and CT head unremarkable.  Repeat echo was done in 4/18.  EF remained 20-25% with moderate diastolic dysfunction.  ? ?She is s/p [employer] SubQ ICD implantation 09/27/16. Admission complicated by AMS thought to be psychogenic. CT head, CTA head/neck and EEG were all negative.  Echo in 2/19 showed EF 35-40%.   ? ?Lost to follow up. Last seen 05/2018.  ? ?She followed up with EP 3/23 and endorsed new DOE. Echo arranged, showing EF 35-40%, LV function moderately decreased with LV global HK and grade II DD, normal RV. Entresto increased. ? ?Today she returns for HF follow up, here with interpretor. Overall feeling more fatigued and not walking as much since she started [place] last visit. She does not have dyspnea with walking or lifting. Denies palpitations, CP,  dizziness, edema, or PND/Orthopnea. Appetite ok. No fever or chills. Weight at home 141 pounds. Taking all medications.  ? ?ECG: none ordered today. ? ?Labs (6/18): K 3.5, creatinine 0.71, hgb 13.1 ?Labs (1/19): digoxin < 0.2 ?Labs (3/19): K 3.1, creatinine 0.63 ?Labs (3/23): K 3.6, creatinine 0.64, digoxin 0.4 ?Labs (4/23): K 3.7, creatinine 0.59, a1c 5.5 ? ?PMH: ?1. Chronic systolic CHF: Nonischemic cardiomyopathy.  ?- LHC/RHC (2/18) with no significant coronary disease. Mean RA 3, mean PCWP 20, CI 2.88.  ?- Echo (1/18) with EF 20-25%, moderate-severe MR.   ?- CPX (4/18): peak VO2 14.1, VE/VCO2 slope 59, RER 0.96 (submaximal).  ?- Echo (4/18): EF 20-25%.  ?- [employer] subcutaneous ICD ?- Echo (2/19): EF 35-40%, mild MR.  ?- Echo (3/23): EF 35-40%, LV function moderately decreased with LV global HK and grade II DD, normal RV. 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  ?3. H/o Still's disease.  ? ?SH: Lives with Mom. No alcohol or drug abuse. Does not smoke cigarettes ? ?FH: No history of CAD or cardiomyopathy.   ?  ?Review of systems complete and found to be negative unless listed in HPI.   ? ?Current Outpatient Medications  ?Medication Sig Dispense Refill  ? aspirin EC 81 MG tablet Take 1 tablet (81 mg total) by mouth daily. 90 tablet 3  ? buprenorphine-naloxone (SUBOXONE) 8-2 mg SUBL SL tablet Place 1 tablet under the tongue 2 (two) times daily as needed for chronic pain 60 tablet 0  ? butalbital-acetaminophen-caffeine (FIORICET) 50-325-40 MG tablet Take 1 to 2  tablets by mouth twice daily as needed for headache or migraine. 30 tablet 0  ? carvedilol (COREG) 12.5 MG tablet Take 1 tablet (12.5 mg total) by mouth 2 (two) times daily with a meal. 180 tablet 3  ? dapagliflozin propanediol (FARXIGA) 10 MG TABS tablet Take 1 tablet (10 mg total) by mouth daily before breakfast. 30 tablet 6  ? digoxin (LANOXIN) 0.125 MG tablet Take 1 tablet (125 mcg total) by mouth daily. 90 tablet 3  ? ferrous sulfate 325 (65 FE) MG tablet  Take 1 tablet (325 mg total) by mouth daily with breakfast. 100 tablet 1  ? furosemide (LASIX) 20 MG tablet Take 2 tablets (40 mg total) by mouth daily. 180 tablet 3  ? methocarbamol (ROBAXIN) 500 MG tablet Take 1 tablet (500 mg total) by mouth 2 (two) times daily as needed for muscle spasms. 20 tablet 0  ? ondansetron (ZOFRAN ODT) 4 MG disintegrating tablet Take 1 tablet (4 mg total) by mouth every 8 (eight) hours as needed for nausea or vomiting. 20 tablet 0  ? PARoxetine (PAXIL) 20 MG tablet Take 1 tablet (20 mg total) by mouth daily. 30 tablet 2  ? Potassium Chloride ER 20 MEQ TBCR Take 20 mEq by mouth daily. 90 tablet 3  ? sacubitril-valsartan (ENTRESTO) 49-51 MG Take 1 tablet by mouth 2 (two) times daily. 180 tablet 3  ? Topiramate ER (TROKENDI XR) 25 MG CP24 Take 1 capsule (25 mg total) by mouth daily. 30 capsule 1  ? traZODone (DESYREL) 100 MG tablet Take 1 to 2 tablets at bedtime as needed 60 tablet 3  ? ?No current facility-administered medications for this encounter.  ? ?BP 114/80   Pulse 66   Wt 64.4 kg (142 lb)   SpO2 99%   BMI 27.73 kg/m?  ? ?Wt Readings from Last 3 Encounters:  ?08/05/21 64.4 kg (142 lb)  ?07/08/21 66.9 kg (147 lb 6.4 oz)  ?06/06/21 66.7 kg (147 lb)  ? ?Physical Exam: ?General:  NAD. No resp difficulty ?HEENT: Normal ?Neck: Supple. No JVD. Carotids 2+ bilat; no bruits. No lymphadenopathy or thryomegaly appreciated. ?Cor: PMI nondisplaced. Regular rate & rhythm. No rubs, gallops or murmurs. ?Lungs: Clear ?Abdomen: Soft, nontender, nondistended. No hepatosplenomegaly. No bruits or masses. Good bowel sounds. ?Extremities: No cyanosis, clubbing, rash, edema ?Neuro: Alert & oriented x 3, cranial nerves grossly intact. Moves all 4 extremities w/o difficulty. Affect pleasant. ? ?ASSESSMENT & PLAN: ?1. Chronic systolic CHF: Nonischemic cardiomyopathy,  LHC 05/2016 no coronary disease.  ?Viral myocarditis as trigger.  [place] subcutaneous ICD.  Echo in 2/19 with EF 35-40%, mild  MR. Echo 3/23 with EF stable at 35-40%. NYHA class II symptoms (mostly fatigue).  Not volume overloaded on exam.  ?- Change Lasix to 40 mg daily PRN ?- Continue Farxiga 10 mg daily for now. If she continues with fatigue at next visit, would stop. ?- Continue Coreg 12.5 mg bid.  ?- Continue Entresto 49/51 mg bid. BMET today. ?- Continue digoxin 0.125 mg daily. Check level today. ?- Start spiro next. ?- Consider repeating CPX down the road. ?2. Mitral regurgitation:  Mild on Echo 3/23. ?3. SDOH: uninsured. Engage HFSW.  ? ?Follow up with APP in 3-4 weeks (check CBC/iron studies if remains fatigued) and 3 months with Dr. Aundra Dubin. ? ?Rafael Bihari, FNP  ?08/05/2021  ? ?

## 2021-08-09 ENCOUNTER — Encounter: Payer: Self-pay | Admitting: Hematology

## 2021-08-09 ENCOUNTER — Other Ambulatory Visit: Payer: Self-pay

## 2021-08-09 ENCOUNTER — Other Ambulatory Visit (HOSPITAL_COMMUNITY): Payer: Self-pay

## 2021-08-10 ENCOUNTER — Other Ambulatory Visit (HOSPITAL_COMMUNITY): Payer: Self-pay

## 2021-08-31 ENCOUNTER — Encounter: Payer: Self-pay | Admitting: Neurology

## 2021-08-31 NOTE — Progress Notes (Deleted)
NEUROLOGY CONSULTATION NOTE  Kathy Rosales MRN: [id number] DOB: Apr 17, 1975  Referring provider: Geryl Rankins, NP Primary care provider: Karle Plumber, MD  Reason for consult:  migraines  Assessment/Plan:   ***   Subjective:  Kathy Rosales is a 46 year old female with CHF who presents for migraines.  History supplemented by referring provider's note.  She is accompanied by a Spanish-speaking interpreter.  Onset:  *** Location:  bi-temporal Quality:  stabbing Intensity:  ***.  *** denies new headache, thunderclap headache or severe headache that wakes *** from sleep. Aura:  absent Prodrome:  absent Associated symptoms:  nausea, photophobia ***.  *** denies associated unilateral numbness or weakness. Duration:  *** Frequency:  daily Frequency of abortive medication: *** Triggers:  menses, stress Relieving factors:  *** Activity:  ***  Of note, in June 2018, she was unresponsive following cardiac cath and then woke up and endorsed that she couldn't feel or move her left arm and leg.  CT head and CTA of head and neck personally reviewed were normal. EEG was normal.  It was felt that symptoms were related to slow recovery from anesthesia.    Current NSAIDS/analgesics:  Fioricet, ASA '81mg'$  daily Current triptans:  none Current ergotamine:  none Current anti-emetic:  Zofran ODT '4mg'$  Current muscle relaxants:  none Current Antihypertensive medications:  carvedilol, furosemide Current Antidepressant medications:  paroxetine '20mg'$  Current Anticonvulsant medications:  Topiramate ER '25mg'$  daily Current anti-CGRP:  *** Current Vitamins/Herbal/Supplements:  *** Current Antihistamines/Decongestants:  *** Other therapy:  *** Hormone/birth control:  *** Other medications:  ***  Past NSAIDS/analgesics:  *** Past abortive triptans:  sumatriptan tab Past abortive ergotamine:  none Past muscle relaxants:  none Past anti-emetic:  *** Past antihypertensive  medications:  *** Past antidepressant medications:  *** Past anticonvulsant medications:  *** Past anti-CGRP:  none Past vitamins/Herbal/Supplements:  none Past antihistamines/decongestants:  none Other past therapies:  none  Caffeine:  *** Alcohol:  *** Smoker:  *** Diet:  *** Exercise:  *** Depression:  ***; Anxiety:  *** Other pain:  *** Sleep hygiene:  *** Family history of headache:  ***      PAST MEDICAL HISTORY: Past Medical History:  Diagnosis Date   Acute systolic CHF (congestive heart failure) ([place]) 05/22/2016   Blood transfusion without reported diagnosis    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[place])     PAST SURGICAL HISTORY: Past Surgical History:  Procedure Laterality Date   COLONOSCOPY WITH PROPOFOL N/A 07/23/2015   Procedure: COLONOSCOPY WITH PROPOFOL;  Surgeon: Ronald Lobo, MD;  [facility];  Service: Endoscopy;  Laterality: N/A;   ESOPHAGOGASTRODUODENOSCOPY (EGD) WITH PROPOFOL N/A 07/23/2015   Procedure: ESOPHAGOGASTRODUODENOSCOPY (EGD) WITH PROPOFOL;  Surgeon: Ronald Lobo, MD;  [facility];  Service: Endoscopy;  Laterality: N/A;   ESOPHAGOGASTRODUODENOSCOPY (EGD) WITH PROPOFOL N/A 11/18/2015   Procedure: ESOPHAGOGASTRODUODENOSCOPY (EGD) WITH PROPOFOL;  Surgeon: Ronald Lobo, MD;  [facility];  Service: Endoscopy;  Laterality: N/A;   RIGHT/LEFT HEART CATH AND CORONARY ANGIOGRAPHY N/A 05/22/2016   Procedure: Right/Left Heart Cath and Coronary Angiography;  Surgeon: Peter M Martinique, MD;  Location: [place] [facility];  Service: Cardiovascular;  Laterality: N/A;   SUBQ ICD IMPLANT N/A 09/27/2016   Procedure: SubQ ICD Implant;  Surgeon: Evans Lance, MD;  Location: [place] [facility];  Service: Cardiovascular;  Laterality: N/A;    MEDICATIONS: Current Outpatient Medications on File Prior to Visit  Medication Sig Dispense Refill   aspirin EC 81 MG tablet Take 1 tablet (  81 mg total) by mouth daily. 90 tablet 3   buprenorphine-naloxone  (SUBOXONE) 8-2 mg SUBL SL tablet Place 1 tablet under the tongue 2 (two) times daily as needed for chronic pain 60 tablet 0   buprenorphine-naloxone (SUBOXONE) 8-2 mg SUBL SL tablet Place 1 tablet under the tongue 2 (two) times daily as needed. 60 tablet 0   butalbital-acetaminophen-caffeine (FIORICET) 50-325-40 MG tablet Take 1 to 2 tablets by mouth twice daily as needed for headache or migraine. 30 tablet 0   carvedilol (COREG) 12.5 MG tablet Take 1 tablet (12.5 mg total) by mouth 2 (two) times daily with a meal. 180 tablet 3   dapagliflozin propanediol (FARXIGA) 10 MG TABS tablet Take 1 tablet (10 mg total) by mouth daily before breakfast. 30 tablet 6   digoxin (LANOXIN) 0.125 MG tablet Take 1 tablet (125 mcg total) by mouth daily. 90 tablet 3   ferrous sulfate 325 (65 FE) MG tablet Take 1 tablet (325 mg total) by mouth daily with breakfast. 100 tablet 1   furosemide (LASIX) 20 MG tablet Take 2 tablets (40 mg total) by mouth as needed. 60 tablet 2   methocarbamol (ROBAXIN) 500 MG tablet Take 1 tablet (500 mg total) by mouth 2 (two) times daily as needed for muscle spasms. 20 tablet 0   ondansetron (ZOFRAN ODT) 4 MG disintegrating tablet Take 1 tablet (4 mg total) by mouth every 8 (eight) hours as needed for nausea or vomiting. 20 tablet 0   PARoxetine (PAXIL) 20 MG tablet Take 1 tablet (20 mg total) by mouth daily. 30 tablet 2   Potassium Chloride ER 20 MEQ TBCR Take 20 mEq by mouth daily. 90 tablet 3   sacubitril-valsartan (ENTRESTO) 49-51 MG Take 1 tablet by mouth 2 (two) times daily. 180 tablet 3   Topiramate ER (TROKENDI XR) 25 MG CP24 Take 1 capsule (25 mg total) by mouth daily. 30 capsule 1   traZODone (DESYREL) 100 MG tablet Take 1 to 2 tablets at bedtime as needed 60 tablet 3   [DISCONTINUED] SUMAtriptan (IMITREX) 25 MG tablet Take 2 tablets (50 mg total) by mouth every 2 (two) hours as needed for migraine. May repeat in 2 hours if headache persists or recurs. 10 tablet 0   No current  facility-administered medications on file prior to visit.    ALLERGIES: Allergies  Allergen Reactions   Tramadol Itching and Other (See Comments)    Red spots all over body and itching   Furosemide Rash   Leflunomide Rash    FAMILY HISTORY: Family History  Problem Relation Age of Onset   Cancer Paternal Grandfather    Mental illness Neg Hx     Objective:  *** General: No acute distress.  Patient appears well-groomed.   Head:  Normocephalic/atraumatic Eyes:  fundi examined but not visualized Neck: supple, no paraspinal tenderness, full range of motion Back: No paraspinal tenderness Heart: regular rate and rhythm Lungs: Clear to auscultation bilaterally. Vascular: No carotid bruits. Neurological Exam: Mental status: alert and oriented to person, place, and time, recent and remote memory intact, fund of knowledge intact, attention and concentration intact, speech fluent and not dysarthric, language intact. Cranial nerves: CN I: not tested CN II: pupils equal, round and reactive to light, visual fields intact CN III, IV, VI:  full range of motion, no nystagmus, no ptosis CN V: facial sensation intact. CN VII: upper and lower face symmetric CN VIII: hearing intact CN IX, X: gag intact, uvula midline CN XI: sternocleidomastoid and trapezius  muscles intact CN XII: tongue midline Bulk & Tone: normal, no fasciculations. Motor:  muscle strength 5/5 throughout Sensation:  Pinprick, temperature and vibratory sensation intact. Deep Tendon Reflexes:  2+ throughout,  toes downgoing.   Finger to nose testing:  Without dysmetria.   Heel to shin:  Without dysmetria.   Gait:  Normal station and stride.  Romberg negative.    Thank you for allowing me to take part in the care of this patient.  Metta Clines, DO  CC:  Karle Plumber, MD  Geryl Rankins, NP

## 2021-09-01 ENCOUNTER — Ambulatory Visit: Payer: Self-pay | Admitting: Neurology

## 2021-09-02 ENCOUNTER — Encounter (HOSPITAL_COMMUNITY): Payer: No Typology Code available for payment source

## 2021-09-05 ENCOUNTER — Other Ambulatory Visit: Payer: Self-pay

## 2021-09-05 ENCOUNTER — Encounter: Payer: Self-pay | Admitting: Hematology

## 2021-09-05 ENCOUNTER — Other Ambulatory Visit (HOSPITAL_COMMUNITY): Payer: Self-pay

## 2021-09-13 ENCOUNTER — Ambulatory Visit (INDEPENDENT_AMBULATORY_CARE_PROVIDER_SITE_OTHER): Payer: Self-pay

## 2021-09-13 DIAGNOSIS — I428 Other cardiomyopathies: Secondary | ICD-10-CM

## 2021-09-16 LAB — CUP PACEART REMOTE DEVICE CHECK
Battery Remaining Percentage: 43 %
Date Time Interrogation Session: 20230614230700
Implantable Lead Implant Date: 20180627
Implantable Lead Location: 753862
Implantable Lead Model: 3401
Implantable Lead Serial Number: 113145
Implantable Pulse Generator Implant Date: 20180627
Pulse Gen Serial Number: 222625

## 2021-09-23 ENCOUNTER — Telehealth (HOSPITAL_COMMUNITY): Payer: Self-pay | Admitting: Psychiatry

## 2021-09-23 NOTE — Telephone Encounter (Signed)
Never received application back. Called and spoke with patient via interpreter. Patient requested that the application be mailed to her. She does not have POI at this time to submit. Once I receive the application back and send it in, will advise the patient to call [employer] for income attestation form.

## 2021-09-26 ENCOUNTER — Telehealth (HOSPITAL_COMMUNITY): Payer: No Payment, Other | Admitting: Psychiatry

## 2021-09-26 ENCOUNTER — Encounter (HOSPITAL_COMMUNITY): Payer: Self-pay

## 2021-09-28 NOTE — Progress Notes (Signed)
Remote ICD transmission.   

## 2021-09-30 ENCOUNTER — Encounter (HOSPITAL_COMMUNITY): Payer: No Typology Code available for payment source

## 2021-10-06 ENCOUNTER — Other Ambulatory Visit (HOSPITAL_COMMUNITY): Payer: Self-pay

## 2021-10-06 ENCOUNTER — Encounter: Payer: Self-pay | Admitting: Hematology

## 2021-10-06 ENCOUNTER — Other Ambulatory Visit: Payer: Self-pay

## 2021-10-07 ENCOUNTER — Encounter: Payer: Self-pay | Admitting: Hematology

## 2021-10-07 ENCOUNTER — Other Ambulatory Visit (HOSPITAL_COMMUNITY): Payer: Self-pay

## 2021-10-07 MED ORDER — BUPRENORPHINE HCL-NALOXONE HCL 8-2 MG SL SUBL
1.0000 | SUBLINGUAL_TABLET | Freq: Two times a day (BID) | SUBLINGUAL | 0 refills | Status: DC
  Filled 2021-10-07: qty 60, 30d supply, fill #0

## 2021-10-10 ENCOUNTER — Telehealth (HOSPITAL_COMMUNITY): Payer: Self-pay | Admitting: *Deleted

## 2021-10-10 ENCOUNTER — Other Ambulatory Visit (HOSPITAL_COMMUNITY): Payer: Self-pay | Admitting: Psychiatry

## 2021-10-10 ENCOUNTER — Encounter: Payer: Self-pay | Admitting: Hematology

## 2021-10-10 ENCOUNTER — Other Ambulatory Visit: Payer: Self-pay

## 2021-10-10 MED ORDER — PAROXETINE HCL 20 MG PO TABS
20.0000 mg | ORAL_TABLET | Freq: Every day | ORAL | 3 refills | Status: DC
  Filled 2021-10-10: qty 30, 30d supply, fill #0

## 2021-10-10 NOTE — Telephone Encounter (Signed)
Patient of Franne Grip Sending request to Dr Zigmund Gottron  Rx Refill Request [facility] @ [facility]  PARoxetine (PAXIL) 20 MG tablet Take 1 tablet (20 mg total) by mouth daily

## 2021-10-10 NOTE — Telephone Encounter (Signed)
Medication refilled and send to prefer pharmacy.

## 2021-10-11 ENCOUNTER — Encounter (HOSPITAL_COMMUNITY): Payer: Self-pay | Admitting: Psychiatry

## 2021-10-11 ENCOUNTER — Other Ambulatory Visit: Payer: Self-pay

## 2021-10-11 ENCOUNTER — Telehealth (INDEPENDENT_AMBULATORY_CARE_PROVIDER_SITE_OTHER): Payer: No Payment, Other | Admitting: Psychiatry

## 2021-10-11 DIAGNOSIS — F3342 Major depressive disorder, recurrent, in full remission: Secondary | ICD-10-CM | POA: Diagnosis not present

## 2021-10-11 MED ORDER — TRAZODONE HCL 100 MG PO TABS
ORAL_TABLET | ORAL | 3 refills | Status: DC
  Filled 2021-10-11: qty 60, fill #0
  Filled 2021-11-15: qty 60, 30d supply, fill #0
  Filled 2021-12-30: qty 60, 30d supply, fill #1
  Filled 2022-01-31: qty 60, 30d supply, fill #2
  Filled 2022-03-15: qty 60, 30d supply, fill #3

## 2021-10-11 MED ORDER — PAROXETINE HCL 20 MG PO TABS
20.0000 mg | ORAL_TABLET | Freq: Every day | ORAL | 3 refills | Status: DC
  Filled 2021-11-15: qty 30, 30d supply, fill #0
  Filled 2021-12-30: qty 30, 30d supply, fill #1
  Filled 2022-01-31: qty 30, 30d supply, fill #2
  Filled 2022-03-15: qty 30, 30d supply, fill #3

## 2021-10-11 NOTE — Progress Notes (Signed)
[place] MD/PA/NP OP Progress Note Virtual Visit via Video Note  I connected with Kathy Rosales on 10/11/21 at  4:00 PM EDT by a video enabled telemedicine application and verified that I am speaking with the correct person using two identifiers.  Location: Patient: Home Provider: Clinic   I discussed the limitations of evaluation and management by telemedicine and the availability of in person appointments. The patient expressed understanding and agreed to proceed.  I provided 30 minutes of non-face-to-face time during this encounter.    10/11/2021 4:13 PM Kathy Rosales [name]  MRN:  [id number]  Chief Complaint: "There are days where I feel down"  HPI: 46 year old female seen today for follow up psychiatric evaluation.  She has a psychiatric history of Delirium and Depression. She is currently managed on Paxil 20 mg daily and Trazodone 1-2 100 mg tablets nightly as needed. She notes that her medications are effective in managing her psychiatric conditions.  Today provider utilized a Spanish speaking interpreter as this is patients primary language. Today the patient is pleasant, cooperative, and engaged in conversation. She informed Probation officer that there are days when she feels down but notes that she is able to cope with it.  Today provider conducted a GAD7 and patient scored a 4. Provider also conducted a PHQ 9 and patient scored a 5.  She endorses adequate sleep and appetite. Today she denies SI/HI/VAH, mania, or paranoia.  No medication changes made today. Patient agreeable to continue medications as prescribed. No other concerns noted at this time.  Visit Diagnosis:    ICD-10-CM   1. Recurrent major depressive disorder, in full remission ([place])  F33.42 traZODone (DESYREL) 100 MG tablet    PARoxetine (PAXIL) 20 MG tablet      Past Psychiatric History: Depression and delirium   Past Medical History:  Past Medical History:  Diagnosis Date   Acute systolic CHF  (congestive heart failure) ([place]) 05/22/2016   Blood transfusion without reported diagnosis    Still's disease [facility])     Past Surgical History:  Procedure Laterality Date   COLONOSCOPY WITH PROPOFOL N/A 07/23/2015   Procedure: COLONOSCOPY WITH PROPOFOL;  Surgeon: Ronald Lobo, MD;  [facility];  Service: Endoscopy;  Laterality: N/A;   ESOPHAGOGASTRODUODENOSCOPY (EGD) WITH PROPOFOL N/A 07/23/2015   Procedure: ESOPHAGOGASTRODUODENOSCOPY (EGD) WITH PROPOFOL;  Surgeon: Ronald Lobo, MD;  [facility];  Service: Endoscopy;  Laterality: N/A;   ESOPHAGOGASTRODUODENOSCOPY (EGD) WITH PROPOFOL N/A 11/18/2015   Procedure: ESOPHAGOGASTRODUODENOSCOPY (EGD) WITH PROPOFOL;  Surgeon: Ronald Lobo, MD;  [facility];  Service: Endoscopy;  Laterality: N/A;   RIGHT/LEFT HEART CATH AND CORONARY ANGIOGRAPHY N/A 05/22/2016   Procedure: Right/Left Heart Cath and Coronary Angiography;  Surgeon: Peter M Martinique, MD;  Location: [place] CV LAB;  Service: Cardiovascular;  Laterality: N/A;   SUBQ ICD IMPLANT N/A 09/27/2016   Procedure: SubQ ICD Implant;  Surgeon: Evans Lance, MD;  Location: [place] CV LAB;  Service: Cardiovascular;  Laterality: N/A;    Family Psychiatric History: Denies  Family History:  Family History  Problem Relation Age of Onset   Cancer Paternal Grandfather    Mental illness Neg Hx     Social History:  Social History   Socioeconomic History   Marital status: Single    Spouse name: Not on file   Number of children: 1   Years of education: Not on file   Highest education level: Not on file  Occupational History   Occupation: unemployeed  Tobacco Use   Smoking status: Never   Smokeless tobacco: Never  Vaping Use   Vaping Use: Never used  Substance and Sexual Activity   Alcohol use: No   Drug use: No   Sexual activity: Not Currently    Birth control/protection: None  Other Topics Concern   Not on file  Social History Narrative   **  Merged History Encounter **       ** Merged History Encounter **       Social Determinants of Health   Financial Resource Strain: High Risk (07/08/2021)   Overall Financial Resource Strain (CARDIA)    Difficulty of Paying Living Expenses: Hard  Food Insecurity: Food Insecurity Present (10/12/2020)   Hunger Vital Sign    Worried About Running Out of Food in the Last Year: Sometimes true    Ran Out of Food in the Last Year: Sometimes true  Transportation Needs: No Transportation Needs (10/12/2020)   PRAPARE - Hydrologist (Medical): No    Lack of Transportation (Non-Medical): No  Physical Activity: Not on file  Stress: Not on file  Social Connections: Not on file    Allergies:  Allergies  Allergen Reactions   Tramadol Itching and Other (See Comments)    Red spots all over body and itching   Furosemide Rash   Leflunomide Rash    Metabolic Disorder Labs: Lab Results  Component Value Date   HGBA1C 5.5 07/08/2021   MPG 111.15 07/08/2021   MPG 120 12/14/2015   No results found for: "PROLACTIN" Lab Results  Component Value Date   CHOL 149 12/14/2015   TRIG 180 (H) 12/14/2015   HDL 40 (L) 12/14/2015   CHOLHDL 3.7 12/14/2015   VLDL 36 12/14/2015   LDLCALC 73 12/14/2015   LDLCALC  09/08/2008    83        Total Cholesterol/HDL:CHD Risk Coronary Heart Disease Risk Table                     Men   Women  1/2 Average Risk   3.4   3.3  Average Risk       5.0   4.4  2 X Average Risk   9.6   7.1  3 X Average Risk  23.4   11.0        Use the calculated Patient Ratio above and the CHD Risk Table to determine the patient's CHD Risk.        ATP III CLASSIFICATION (LDL):  <100     mg/dL   Optimal  100-129  mg/dL   Near or Above                    Optimal  130-159  mg/dL   Borderline  160-189  mg/dL   High  >190     mg/dL   Very High   Lab Results  Component Value Date   TSH 2.530 06/09/2019   TSH 1.670 09/18/2017    Therapeutic Level  Labs: No results found for: "LITHIUM" No results found for: "VALPROATE" No results found for: "CBMZ"  Current Medications: Current Outpatient Medications  Medication Sig Dispense Refill   aspirin EC 81 MG tablet Take 1 tablet (81 mg total) by mouth daily. 90 tablet 3   buprenorphine-naloxone (SUBOXONE) 8-2 mg SUBL SL tablet Place 1 tablet under the tongue 2 (two) times daily as needed for chronic pain 60 tablet 0   buprenorphine-naloxone (SUBOXONE) 8-2 mg  SUBL SL tablet Place 1 tablet under the tongue 2 (two) times daily as needed. 60 tablet 0   buprenorphine-naloxone (SUBOXONE) 8-2 mg SUBL SL tablet Place 1 tablet under the tongue 2 (two) times daily as needed 60 tablet 0   butalbital-acetaminophen-caffeine (FIORICET) 50-325-40 MG tablet Take 1 to 2 tablets by mouth twice daily as needed for headache or migraine. 30 tablet 0   carvedilol (COREG) 12.5 MG tablet Take 1 tablet (12.5 mg total) by mouth 2 (two) times daily with a meal. 180 tablet 3   dapagliflozin propanediol (FARXIGA) 10 MG TABS tablet Take 1 tablet (10 mg total) by mouth daily before breakfast. 30 tablet 6   digoxin (LANOXIN) 0.125 MG tablet Take 1 tablet (125 mcg total) by mouth daily. 90 tablet 3   ferrous sulfate 325 (65 FE) MG tablet Take 1 tablet (325 mg total) by mouth daily with breakfast. 100 tablet 1   furosemide (LASIX) 20 MG tablet Take 2 tablets (40 mg total) by mouth as needed. 60 tablet 2   methocarbamol (ROBAXIN) 500 MG tablet Take 1 tablet (500 mg total) by mouth 2 (two) times daily as needed for muscle spasms. 20 tablet 0   ondansetron (ZOFRAN ODT) 4 MG disintegrating tablet Take 1 tablet (4 mg total) by mouth every 8 (eight) hours as needed for nausea or vomiting. 20 tablet 0   PARoxetine (PAXIL) 20 MG tablet Take 1 tablet (20 mg total) by mouth daily. 30 tablet 3   Potassium Chloride ER 20 MEQ TBCR Take 20 mEq by mouth daily. 90 tablet 3   sacubitril-valsartan (ENTRESTO) 49-51 MG Take 1 tablet by mouth 2 (two)  times daily. 180 tablet 3   Topiramate ER (TROKENDI XR) 25 MG CP24 Take 1 capsule (25 mg total) by mouth daily. 30 capsule 1   traZODone (DESYREL) 100 MG tablet Take 1 to 2 tablets at bedtime as needed 60 tablet 3   No current facility-administered medications for this visit.     Musculoskeletal: Strength & Muscle Tone: within normal limits and  telehealth visit Gait & Station: normal, telephone visit Patient leans: N/A  Psychiatric Specialty Exam: Review of Systems  There were no vitals taken for this visit.There is no height or weight on file to calculate BMI.  General Appearance: Well Groomed  Eye Contact:  Good  Speech:  Clear and Coherent and Normal Rate  Volume:  Normal  Mood:  Euthymic  Affect:  Appropriate and Congruent  Thought Process:  Coherent, Goal Directed, and Linear  Orientation:  Full (Time, Place, and Person)  Thought Content: WDL and Logical   Suicidal Thoughts:  No  Homicidal Thoughts:  No  Memory:  Immediate;   Good Recent;   Good Remote;   Good  Judgement:  Good  Insight:  Good  Psychomotor Activity:  Normal  Concentration:  Concentration: Good and Attention Span: Good  Recall:  Good  Fund of Knowledge: Good  Language: Good  Akathisia:  No  Handed:  Right  AIMS (if indicated): not done  Assets:  Communication Skills Desire for Improvement Financial Resources/Insurance [place] Talents/Skills  ADL's:  Intact  Cognition: WNL  Sleep:  Good   Screenings: AIMS    Flowsheet Row Admission (Discharged) from 12/12/2015 in [place] [facility]  AIMS Total Score 0      AUDIT    Flowsheet Row Admission (Discharged) from 12/12/2015 in [place] [facility]  Alcohol Use Disorder Identification Test Final  Score (AUDIT) 0      GAD-7    Flowsheet Row Video Visit from 10/11/2021 in [facility] Video Visit from 03/30/2021 in [facility] Video Visit from 11/19/2020 in [facility] Office Visit from 08/06/2020 in [place] Office Visit from 06/04/2020 in Primary Care at [facility]  Total GAD-7 Score 4 0 4 0 7      PHQ2-9    Flowsheet Row Video Visit from 10/11/2021 in [facility] Video Visit from 03/30/2021 in [facility] Video Visit from 11/19/2020 in [facility] Office Visit from 08/06/2020 in [place] Visit from 06/04/2020 in Primary Care at [facility] Total Score 3 0 2 0 2  PHQ-9 Total Score 5 0 5 0 8      [place] ED from 06/21/2020 in [place] Office Visit from 06/04/2020 in [place] No Risk No Risk        Assessment and Plan: Patient notes that she is doing well on her current medication regimen. No medication changes made today. Patient agreeable to continue medications as prescribed.   1. Recurrent major depressive disorder, in full remission ([place])  Continue- traZODone (DESYREL) 100 MG tablet; Take 1 to 2 tablets at bedtime as needed  Dispense: 60 tablet; Refill: 3 Continue- PARoxetine (PAXIL) 20 MG tablet; Take 1 tablet (20 mg total) by mouth daily.  Dispense: 30 tablet; Refill: 3   Follow up in 3 months    Salley Slaughter, NP 10/11/2021, 4:13 PM

## 2021-10-12 ENCOUNTER — Other Ambulatory Visit (HOSPITAL_COMMUNITY): Payer: Self-pay

## 2021-10-12 ENCOUNTER — Other Ambulatory Visit: Payer: Self-pay

## 2021-10-28 ENCOUNTER — Ambulatory Visit (HOSPITAL_COMMUNITY)
Admission: RE | Admit: 2021-10-28 | Discharge: 2021-10-28 | Disposition: A | Discharge status: Home / Self Care | Payer: Self-pay | Source: Ambulatory Visit | Attending: Family Medicine | Admitting: Family Medicine

## 2021-10-28 ENCOUNTER — Encounter (HOSPITAL_COMMUNITY): Payer: Self-pay

## 2021-10-28 VITALS — BP 122/84 | HR 82 | Wt 141.4 lb

## 2021-10-28 DIAGNOSIS — I340 Nonrheumatic mitral (valve) insufficiency: Secondary | ICD-10-CM

## 2021-10-28 DIAGNOSIS — I428 Other cardiomyopathies: Secondary | ICD-10-CM | POA: Insufficient documentation

## 2021-10-28 DIAGNOSIS — Z597 Insufficient social insurance and welfare support: Secondary | ICD-10-CM | POA: Insufficient documentation

## 2021-10-28 DIAGNOSIS — Z5989 Other problems related to housing and economic circumstances: Secondary | ICD-10-CM

## 2021-10-28 DIAGNOSIS — Z79899 Other long term (current) drug therapy: Secondary | ICD-10-CM | POA: Insufficient documentation

## 2021-10-28 DIAGNOSIS — R0609 Other forms of dyspnea: Secondary | ICD-10-CM | POA: Insufficient documentation

## 2021-10-28 DIAGNOSIS — I081 Rheumatic disorders of both mitral and tricuspid valves: Secondary | ICD-10-CM | POA: Insufficient documentation

## 2021-10-28 DIAGNOSIS — R5383 Other fatigue: Secondary | ICD-10-CM | POA: Insufficient documentation

## 2021-10-28 DIAGNOSIS — I5022 Chronic systolic (congestive) heart failure: Secondary | ICD-10-CM | POA: Insufficient documentation

## 2021-10-28 LAB — BASIC METABOLIC PANEL
Anion gap: 7 (ref 5–15)
BUN: 9 mg/dL (ref 6–20)
CO2: 25 mmol/L (ref 22–32)
Calcium: 8.8 mg/dL — ABNORMAL LOW (ref 8.9–10.3)
Chloride: 106 mmol/L (ref 98–111)
Creatinine, Ser: 0.6 mg/dL (ref 0.44–1.00)
GFR, Estimated: >60 mL/min (ref >60)
Glucose, Bld: 97 mg/dL (ref 70–99)
Potassium: 3.9 mmol/L (ref 3.5–5.1)
Sodium: 138 mmol/L (ref 135–145)

## 2021-10-28 LAB — DIGOXIN LEVEL: Digoxin Level: <0.2 ng/mL — ABNORMAL LOW (ref 0.8–2.0)

## 2021-10-28 NOTE — Progress Notes (Signed)
Medication Samples have been provided to the patient.  Drug name: Laurey Morale       Strength: 49-'51mg'$         Qty: 2  LOT: [id number]  Exp.Date: 02/2023  Dosing instructions: take 1 tab po bid  The patient has been instructed regarding the correct time, dose, and frequency of taking this medication, including desired effects and most common side effects.   Joey Lierman R Chett Taniguchi 0:93 AM 10/28/2021

## 2021-10-28 NOTE — Patient Instructions (Addendum)
EKG done today.  Labs done today. We will contact you only if your labs are abnormal.  RESTART Entresto 49-'51mg'$  (1 tablet) by mouth 2 times daily.  No other medication changes were made. Please continue all current medications as prescribed.  Your physician recommends that you schedule a follow-up appointment in: 2 weeks with Dr. Aundra Dubin.  If you have any questions or concerns before your next appointment please send [place] a message through [place] or call our office at [phone].    TO LEAVE A MESSAGE FOR THE NURSE SELECT OPTION 2, PLEASE LEAVE A MESSAGE INCLUDING: YOUR NAME DATE OF BIRTH CALL BACK NUMBER REASON FOR CALL**this is important as we prioritize the call backs  YOU WILL RECEIVE A CALL BACK THE SAME DAY AS LONG AS YOU CALL BEFORE 4:00 PM   Do the following things EVERYDAY: Weigh yourself in the morning before breakfast. Write it down and keep it in a log. Take your medicines as prescribed Eat low salt foods--Limit salt (sodium) to 2000 mg per day.  Stay as active as you can everyday Limit all fluids for the day to less than 2 liters   At the [facility], you and your health needs are our priority. As part of our continuing mission to provide you with exceptional heart care, we have created designated Provider Care Teams. These Care Teams include your primary Cardiologist (physician) and Advanced Practice Providers (APPs- Physician Assistants and Nurse Practitioners) who all work together to provide you with the care you need, when you need it.   You may see any of the following providers on your designated Care Team at your next follow up: Dr Glori Bickers Dr Haynes Kerns, NP Lyda Jester, [place] Audry Riles, PharmD   Please be sure to bring in all your medications bottles to every appointment.

## 2021-10-28 NOTE — Progress Notes (Signed)
Advanced Heart Failure Clinic Note    PCP: Ladell Pier, MD Primary Cardiologist: Dr. Stanford Breed  HF Cardiologist: Dr Aundra Dubin   HPI:  Kathy Rosales is a 46 y.o. female with a past medical history of NICM EF 25-30% (cath in 05/2016 with normal cors), moderate mitral regurgitation, and tricuspid regurgitation. No FH of coronary disease.   Diagnosed with CHF and  went to her PCP in January 2018 for dyspnea. Says she started getting SOB in the fall. Denies any type of viral illness. No family history of heart disease. Echo was performed and showed reduced EF 20-25% with mod-severe MR. at 25-30%. She was referred to Dr. Stanford Breed who then set her up for a left and right heart cath. Admitted over night on 2/19 after cath due to changes in LOC after cath, was hypotensive. Symptoms resolved, head CT negative. Optimization of her medications was prohibited by hypotension prior to discharge. Started on corlanor.   Had CPX 07/15/16. Results as below. Immediately after, pt sat in chair and then had a pseudoseizure. No tonic/clonic activity. Pt taken to ED with Neurology consulted. Pt recovered spontaneously recovered. CT angio chest and CT head unremarkable.  Repeat echo was done in 4/18.  EF remained 20-25% with moderate diastolic dysfunction.   She is s/p [employer] ICD implantation 09/27/16. Admission complicated by AMS thought to be psychogenic. CT head, CTA head/neck and EEG were all negative.  Echo in 2/19 showed EF 35-40%.    Lost to follow up. Last seen 05/2018.   She followed up with EP 3/23 and endorsed new DOE. Echo arranged, showing EF 35-40%, LV function moderately decreased with LV global HK and grade II DD, normal RV. Entresto increased.  Lost to follow up, last seen 05/2018. Returned 5/23, stable NYHA II, limited mostly by fatigue. Lasix changed to PRN with plans to further titrate GDMT.   Today she returns for HF follow up with interpretor. Overall feeling  fine. She has dyspnea walking fast but no longer fatigued. Denies palpitations, CP, dizziness, edema, or PND/Orthopnea. Appetite ok. No fever or chills. Weight at home 140- 142 pounds. Has not needed Lasix recently. Ran out of Entresto 2.5 weeks ago.   ECG (personally reviewed): NSR 80 bpm  Labs (6/18): K 3.5, creatinine 0.71, hgb 13.1 Labs (1/19): digoxin < 0.2 Labs (3/19): K 3.1, creatinine 0.63 Labs (3/23): K 3.6, creatinine 0.64, digoxin 0.4 Labs (4/23): K 3.7, creatinine 0.59, a1c 5.5 Labs (5/23): K 4.0, creatinine 0.63  PMH: 1. Chronic systolic CHF: Nonischemic cardiomyopathy.  - LHC/RHC (2/18) with no significant coronary disease. Mean RA 3, mean PCWP 20, CI 2.88.  - Echo (1/18) with EF 20-25%, moderate-severe MR.   - CPX (4/18): peak VO2 14.1, VE/VCO2 slope 59, RER 0.96 (submaximal).  - Echo (4/18): EF 20-25%.  - [place] subcutaneous ICD - Echo (2/19): EF 35-40%, mild MR.  - Echo (3/23): EF 35-40%, LV function moderately decreased with LV global HK and grade II DD, normal RV. 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  3. H/o Still's disease.   SH: Lives with Mom. No alcohol or drug abuse. Does not smoke cigarettes  FH: No history of CAD or cardiomyopathy.     Review of systems complete and found to be negative unless listed in HPI.    Current Outpatient Medications  Medication Sig Dispense Refill   aspirin EC 81 MG tablet Take 1 tablet (81 mg total) by mouth daily. 90 tablet 3   buprenorphine-naloxone (SUBOXONE) 8-2  mg SUBL SL tablet Place 1 tablet under the tongue 2 (two) times daily as needed 60 tablet 0   butalbital-acetaminophen-caffeine (FIORICET) 50-325-40 MG tablet Take 1 to 2 tablets by mouth twice daily as needed for headache or migraine. 30 tablet 0   carvedilol (COREG) 12.5 MG tablet Take 1 tablet (12.5 mg total) by mouth 2 (two) times daily with a meal. 180 tablet 3   dapagliflozin propanediol (FARXIGA) 10 MG TABS tablet Take 1 tablet (10 mg total) by mouth daily  before breakfast. 30 tablet 6   digoxin (LANOXIN) 0.125 MG tablet Take 1 tablet (125 mcg total) by mouth daily. 90 tablet 3   ferrous sulfate 325 (65 FE) MG tablet Take 325 mg by mouth daily with breakfast.     furosemide (LASIX) 20 MG tablet Take 2 tablets (40 mg total) by mouth as needed. 60 tablet 2   methocarbamol (ROBAXIN) 500 MG tablet Take 1 tablet (500 mg total) by mouth 2 (two) times daily as needed for muscle spasms. 20 tablet 0   ondansetron (ZOFRAN ODT) 4 MG disintegrating tablet Take 1 tablet (4 mg total) by mouth every 8 (eight) hours as needed for nausea or vomiting. 20 tablet 0   PARoxetine (PAXIL) 20 MG tablet Take 1 tablet (20 mg total) by mouth daily. 30 tablet 3   Potassium Chloride ER 20 MEQ TBCR Take 20 mEq by mouth daily. 90 tablet 3   Topiramate ER (TROKENDI XR) 25 MG CP24 Take 1 capsule (25 mg total) by mouth daily. 30 capsule 1   traZODone (DESYREL) 100 MG tablet Take 1 to 2 tablets at bedtime as needed 60 tablet 3   sacubitril-valsartan (ENTRESTO) 49-51 MG Take 1 tablet by mouth 2 (two) times daily. (Patient not taking: Reported on 10/28/2021) 180 tablet 3   No current facility-administered medications for this encounter.   BP 122/84   Pulse 82   Wt 64.1 kg (141 lb 6.4 oz)   SpO2 96%   BMI 27.62 kg/m   Wt Readings from Last 3 Encounters:  10/28/21 64.1 kg (141 lb 6.4 oz)  08/05/21 64.4 kg (142 lb)  07/08/21 66.9 kg (147 lb 6.4 oz)   Physical Exam: General:  NAD. No resp difficulty HEENT: Normal Neck: Supple. No JVD. Carotids 2+ bilat; no bruits. No lymphadenopathy or thryomegaly appreciated. Cor: PMI nondisplaced. Regular rate & rhythm. No rubs, gallops or murmurs. Lungs: Clear Abdomen: Soft, nontender, nondistended. No hepatosplenomegaly. No bruits or masses. Good bowel sounds. Extremities: No cyanosis, clubbing, rash, edema Neuro: Alert & oriented x 3, cranial nerves grossly intact. Moves all 4 extremities w/o difficulty. Affect pleasant.  ASSESSMENT &  PLAN: 1. Chronic systolic CHF: Nonischemic cardiomyopathy,  LHC 05/2016 no coronary disease.  ?Viral myocarditis as trigger.  [place] subcutaneous ICD.  Echo in 2/19 with EF 35-40%, mild MR. Echo 3/23 with EF stable at 35-40%. NYHA class II symptoms, seem improved and fatigue has subsided. She is not volume overloaded on exam.  - Restart Entresto 49/51 mg bid (has been out of x 2 weeks). BMET today, repeat at follow up in a couple weeks.  - Start spiro next visit. - Continue Lasix 40 mg daily PRN. - Continue Farxiga 10 mg daily. - Continue Coreg 12.5 mg bid.  - Continue digoxin 0.125 mg daily. Check level next visit, she took med today. - Consider repeating CPX down the road. 2. Mitral regurgitation:  Mild on Echo 3/23. 3. SDOH: uninsured. HFSW helping with resources.  - Patient assistance  for Entresto started, other meds through HF fund.  Follow up with Dr. Aundra Dubin as scheduled.  [place], [place]  10/28/2021

## 2021-11-01 ENCOUNTER — Other Ambulatory Visit (HOSPITAL_COMMUNITY): Payer: Self-pay

## 2021-11-01 MED ORDER — ENTRESTO 49-51 MG PO TABS: 1.0000 | ORAL_TABLET | Freq: Two times a day (BID) | ORAL | 3 refills | Status: DC

## 2021-11-01 NOTE — Telephone Encounter (Addendum)
Sent in application via fax. Document scanned to chart. Reminded patient POI is still needed.  Charlann Boxer, CPhT

## 2021-11-02 ENCOUNTER — Other Ambulatory Visit (HOSPITAL_COMMUNITY): Payer: Self-pay

## 2021-11-02 ENCOUNTER — Encounter: Payer: Self-pay | Admitting: Hematology

## 2021-11-02 MED ORDER — BUPRENORPHINE HCL-NALOXONE HCL 8-2 MG SL SUBL
1.0000 | SUBLINGUAL_TABLET | Freq: Two times a day (BID) | SUBLINGUAL | 0 refills | Status: DC | PRN
  Filled 2021-11-02 – 2021-11-15 (×2): qty 60, 30d supply, fill #0

## 2021-11-11 ENCOUNTER — Ambulatory Visit (HOSPITAL_COMMUNITY)
Admission: RE | Admit: 2021-11-11 | Discharge: 2021-11-11 | Disposition: A | Discharge status: Home / Self Care | Payer: Self-pay | Source: Ambulatory Visit | Attending: Cardiology | Admitting: Cardiology

## 2021-11-11 ENCOUNTER — Other Ambulatory Visit: Payer: Self-pay

## 2021-11-11 ENCOUNTER — Encounter: Payer: Self-pay | Admitting: Hematology

## 2021-11-11 ENCOUNTER — Encounter (HOSPITAL_COMMUNITY): Payer: Self-pay | Admitting: Cardiology

## 2021-11-11 VITALS — BP 122/88 | HR 80 | Wt 143.4 lb

## 2021-11-11 DIAGNOSIS — I081 Rheumatic disorders of both mitral and tricuspid valves: Secondary | ICD-10-CM | POA: Insufficient documentation

## 2021-11-11 DIAGNOSIS — Z597 Insufficient social insurance and welfare support: Secondary | ICD-10-CM | POA: Insufficient documentation

## 2021-11-11 DIAGNOSIS — R0609 Other forms of dyspnea: Secondary | ICD-10-CM | POA: Insufficient documentation

## 2021-11-11 DIAGNOSIS — Z7984 Long term (current) use of oral hypoglycemic drugs: Secondary | ICD-10-CM | POA: Insufficient documentation

## 2021-11-11 DIAGNOSIS — I5022 Chronic systolic (congestive) heart failure: Secondary | ICD-10-CM | POA: Insufficient documentation

## 2021-11-11 DIAGNOSIS — I428 Other cardiomyopathies: Secondary | ICD-10-CM | POA: Insufficient documentation

## 2021-11-11 MED ORDER — SPIRONOLACTONE 25 MG PO TABS
12.5000 mg | ORAL_TABLET | Freq: Every day | ORAL | 3 refills | Status: DC
Start: 2021-11-11 — End: 2022-02-10
  Filled 2021-11-11: qty 45, 90d supply, fill #0

## 2021-11-11 NOTE — Patient Instructions (Addendum)
No se realizaron laboratorios hoy.  DEJAR de tomar digoxina  COMIENCE Espironolactona 12.5 mg (1/2 tableta) por va oral diariamente.  No se realizaron Financial trader. Contine con todos los medicamentos actuales segn lo prescrito.  Su mdico recomienda que programe una cita de seguimiento en: 10 das para una cita solo de laboratorio, 3 semanas para una cita con nuestro farmacutico de Copy y en 3 meses con nuestra Clnica NP/PA aqu en nuestra oficina.  Si tiene [place] pregunta o inquietud antes de su prxima cita, envenos un mensaje a travs de mychart o llame a nuestra oficina al [phone].  PARA DEJAR UN MENSAJE PARA LA ENFERMERA SELECCIONE LA OPCIN 2, POR FAVOR DEJE UN MENSAJE QUE INCLUYA:  SU NOMBRE  FECHA DE NACIMIENTO  NMERO DE DEVOLUCIN DE LLAMADA  MOTIVO DE LA LLAMADA**esto es importante ya que damos prioridad a las devoluciones de llamadas  RECIBIR UNA LLAMADA EL MISMO DA SIEMPRE QUE LLAME ANTES DE LAS 4:00 PM   Haz lo siguiente TODOS LOS DAS: 1) Psate por la maana antes del desayuno. Antelo y gurdelo en un registro. 2) Tome sus medicamentos segn lo prescrito 3) Coma alimentos bajos en sal: limite la sal (sodio) a 2000 mg por da. 4) Mantngase tan activo como pueda [employer] 5) Limite todos los lquidos del da a menos de 2 litros   En la [place] de [place], usted y sus necesidades de salud son Cleotis Nipper prioridad. Como parte de nuestra misin continua de brindarle una atencin cardaca excepcional, hemos creado equipos de atencin de proveedores designados. Estos equipos de atencin incluyen a su cardilogo primario (mdico) y proveedores de Financial planner (APP, asistentes mdicos y enfermeras practicantes) que trabajan juntos para brindarle la atencin que necesita, cuando la necesita.  Puede ver a cualquiera de los siguientes proveedores en su equipo de atencin designado en su prximo seguimiento:   Dr. Quillian Quince Bensimhon  Dr. Loralie Champagne  Amy Ninfa Meeker, NP  Imagene Gurney, [place]  Audry Riles, Doctora en [place] de traer todas las botellas de sus medicamentos a cada cita.

## 2021-11-11 NOTE — Progress Notes (Signed)
Advanced Heart Failure Clinic Note    PCP: Ladell Pier, MD Primary Cardiologist: Dr. Stanford Breed  HF Cardiologist: Dr Aundra Dubin   HPI:  Kathy Rosales is a 46 y.o. female with a past medical history of NICM EF 25-30% (cath in 05/2016 with normal cors), moderate mitral regurgitation, and tricuspid regurgitation. No FH of coronary disease.   Diagnosed with CHF and  went to her PCP in January 2018 for dyspnea. Says she started getting SOB in the fall. Denies any type of viral illness. No family history of heart disease. Echo was performed and showed reduced EF 20-25% with mod-severe MR. at 25-30%. She was referred to Dr. Stanford Breed who then set her up for a left and right heart cath. Admitted over night on 2/19 after cath due to changes in LOC after cath, was hypotensive. Symptoms resolved, head CT negative. Optimization of her medications was prohibited by hypotension prior to discharge. Started on corlanor.   Had CPX 07/15/16. Results as below. Immediately after, pt sat in chair and then had a pseudoseizure. No tonic/clonic activity. Pt taken to ED with Neurology consulted. Pt recovered spontaneously recovered. CT angio chest and CT head unremarkable.  Repeat echo was done in 4/18.  EF remained 20-25% with moderate diastolic dysfunction.   She is s/p [employer] ICD implantation 09/27/16. Admission complicated by AMS thought to be psychogenic. CT head, CTA head/neck and EEG were all negative.  Echo in 2/19 showed EF 35-40%.    Lost to follow up. Last seen 05/2018.   She followed up with EP 3/23 and endorsed new DOE. Echo arranged, showing EF 35-40%, LV function moderately decreased with LV global HK and grade II DD, normal RV. Entresto increased.  Patient returns for followup of CHF.  No significant exertional dyspnea.  No fatigue.  She is a stay-at-home mother, no problems with chores around the house.  She walks for 3 miles 4 days/week. No Lasix use.   Labs (6/18): K  3.5, creatinine 0.71, hgb 13.1 Labs (1/19): digoxin < 0.2 Labs (3/19): K 3.1, creatinine 0.63 Labs (3/23): K 3.6, creatinine 0.64, digoxin 0.4 Labs (4/23): K 3.7, creatinine 0.59, a1c 5.5 Labs (5/23): K 4.0, creatinine 0.63 Labs (7/23): K 3.9, creatinine 0.6  PMH: 1. Chronic systolic CHF: Nonischemic cardiomyopathy.  - LHC/RHC (2/18) with no significant coronary disease. Mean RA 3, mean PCWP 20, CI 2.88.  - Echo (1/18) with EF 20-25%, moderate-severe MR.   - CPX (4/18): peak VO2 14.1, VE/VCO2 slope 59, RER 0.96 (submaximal).  - Echo (4/18): EF 20-25%.  - [place] subcutaneous ICD - Echo (2/19): EF 35-40%, mild MR.  - Echo (3/23): EF 35-40%, LV function moderately decreased with LV global HK and grade II DD, normal RV. 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  3. H/o Still's disease.   SH: Lives with Mom. No alcohol or drug abuse. Does not smoke cigarettes  FH: No history of CAD or cardiomyopathy.     Review of systems complete and found to be negative unless listed in HPI.    Current Outpatient Medications  Medication Sig Dispense Refill   aspirin EC 81 MG tablet Take 1 tablet (81 mg total) by mouth daily. 90 tablet 3   buprenorphine-naloxone (SUBOXONE) 8-2 mg SUBL SL tablet Place 1 tablet under the tongue 2 (two) times daily as needed. 60 tablet 0   butalbital-acetaminophen-caffeine (FIORICET) 50-325-40 MG tablet Take 1 to 2 tablets by mouth twice daily as needed for headache or migraine. 30 tablet  0   carvedilol (COREG) 12.5 MG tablet Take 1 tablet (12.5 mg total) by mouth 2 (two) times daily with a meal. 180 tablet 3   dapagliflozin propanediol (FARXIGA) 10 MG TABS tablet Take 1 tablet (10 mg total) by mouth daily before breakfast. 30 tablet 6   ferrous sulfate 325 (65 FE) MG tablet Take 325 mg by mouth daily with breakfast.     furosemide (LASIX) 20 MG tablet Take 2 tablets (40 mg total) by mouth as needed. 60 tablet 2   methocarbamol (ROBAXIN) 500 MG tablet Take 1 tablet (500 mg  total) by mouth 2 (two) times daily as needed for muscle spasms. 20 tablet 0   ondansetron (ZOFRAN ODT) 4 MG disintegrating tablet Take 1 tablet (4 mg total) by mouth every 8 (eight) hours as needed for nausea or vomiting. 20 tablet 0   PARoxetine (PAXIL) 20 MG tablet Take 1 tablet (20 mg total) by mouth daily. 30 tablet 3   Potassium Chloride ER 20 MEQ TBCR Take 20 mEq by mouth daily. 90 tablet 3   sacubitril-valsartan (ENTRESTO) 49-51 MG Take 1 tablet by mouth 2 (two) times daily. 180 tablet 3   spironolactone (ALDACTONE) 25 MG tablet Take 0.5 tablets (12.5 mg total) by mouth daily. 45 tablet 3   Topiramate ER (TROKENDI XR) 25 MG CP24 Take 1 capsule (25 mg total) by mouth daily. 30 capsule 1   traZODone (DESYREL) 100 MG tablet Take 1 to 2 tablets at bedtime as needed 60 tablet 3   No current facility-administered medications for this encounter.   BP 122/88   Pulse 80   Wt 65 kg (143 lb 6.4 oz)   SpO2 95%   BMI 28.01 kg/m   Wt Readings from Last 3 Encounters:  11/11/21 65 kg (143 lb 6.4 oz)  10/28/21 64.1 kg (141 lb 6.4 oz)  08/05/21 64.4 kg (142 lb)   Physical Exam: General: NAD Neck: No JVD, no thyromegaly or thyroid nodule.  Lungs: Clear to auscultation bilaterally with normal respiratory effort. CV: Nondisplaced PMI.  Heart regular S1/S2, no S3/S4, no murmur.  No peripheral edema.  No carotid bruit.  Normal pedal pulses.  Abdomen: Soft, nontender, no hepatosplenomegaly, no distention.  Skin: Intact without lesions or rashes.  Neurologic: Alert and oriented x 3.  Psych: Normal affect. Extremities: No clubbing or cyanosis.  HEENT: Normal.   ASSESSMENT & PLAN: 1. Chronic systolic CHF: Nonischemic cardiomyopathy,  LHC 05/2016 no coronary disease.  ?Viral myocarditis as trigger.  [place] subcutaneous ICD.  Echo in 2/19 with EF 35-40%, mild MR. Echo 3/23 with EF stable at 35-40%. NYHA class I symptoms, not volume overloaded.  - Continue Entresto 49/51 mg bid.  - Start  spironolactone 12.5 mg daily. BMET 10 days.  - Think she can stop digoxin.  - Continue Lasix 40 mg daily PRN. - Continue Farxiga 10 mg daily. - Continue Coreg 12.5 mg bid.  2. Mitral regurgitation:  Mild on Echo 3/23. 3. SDOH: uninsured. HFSW helping with resources.   Followup 3 wks pharmacist for med titration, see APP in 3 months.   Kathy Champagne, MD  11/11/2021

## 2021-11-15 ENCOUNTER — Other Ambulatory Visit (HOSPITAL_COMMUNITY): Payer: Self-pay

## 2021-11-15 ENCOUNTER — Encounter: Payer: Self-pay | Admitting: Hematology

## 2021-11-15 ENCOUNTER — Other Ambulatory Visit: Payer: Self-pay

## 2021-11-25 ENCOUNTER — Other Ambulatory Visit (HOSPITAL_COMMUNITY): Payer: Self-pay

## 2021-11-28 NOTE — Progress Notes (Deleted)
NEUROLOGY CONSULTATION NOTE  Kathy Rosales MRN: [id number] DOB: 02-20-1976  Referring provider: Geryl Rankins, NP Primary care provider: Karle Plumber, MD  Reason for consult:  migraine  Assessment/Plan:   ***   Subjective:  Kathy Rosales is a 46 year old female with CHF and mitral valve insufficiency s/p ICD implant who presents for migraines.  History supplemented by referring provider's note.  Onset:  *** Location:  *** Quality:  *** Intensity:  ***.  *** denies new headache, thunderclap headache or severe headache that wakes *** from sleep. Aura:  *** Prodrome:  *** Postdrome:  *** Associated symptoms:  ***.  *** denies associated unilateral numbness or weakness. Duration:  *** Frequency:  *** Frequency of abortive medication: *** Triggers:  *** Relieving factors:  *** Activity:  ***  Past NSAIDS/analgesics:  acetaminophen, BC powder, naproxen, ketorolac '10mg'$ , meloxicam, tramadol Past abortive triptans:  sumatriptan tab Past abortive ergotamine:  none Past muscle relaxants:  none Past anti-emetic:  promethazine Past antihypertensive medications:  lisinopril, losartan Past antidepressant medications:  duloxetine Past anticonvulsant medications:  gabapentin Past anti-CGRP:  none Past vitamins/Herbal/Supplements:  none Past antihistamines/decongestants:  none Other past therapies:  none  Current NSAIDS/analgesics:  FIoricet, ASA '81mg'$  daily Current triptans:  none Current ergotamine:  none Current anti-emetic:  Zofran ODT '4mg'$  Current muscle relaxants:  methocarbamol '500mg'$  BID PRN Current Antihypertensive medications:  carvedilol, furosemide Current Antidepressant medications:  paroxetine '20mg'$  daily Current Anticonvulsant medications:  topiramate (Trokendi) ER '25mg'$  daily Current anti-CGRP:  none Current Vitamins/Herbal/Supplements:  ferrous sulfate, KCl Current Antihistamines/Decongestants:  none Other therapy:   none Hormone/birth control:  none Other medications:  trazodone 100-'200mg'$  QHS (insomnia)  Other history.  Underwent ICD implant for CHF in June 2018.  Following surgery, she was difficult to arouse.  CT head and CTA head and neck personally reviewed were unremarkable.  EEG was negative for seizure activity.     Caffeine:  *** Alcohol:  *** Smoker:  *** Diet:  *** Exercise:  *** Depression:  ***; Anxiety:  *** Other pain:  *** Sleep hygiene:  *** Family history of headache:  ***      PAST MEDICAL HISTORY: Past Medical History:  Diagnosis Date   Acute systolic CHF (congestive heart failure) ([place]) 05/22/2016   Blood transfusion without reported diagnosis    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[place])     PAST SURGICAL HISTORY: Past Surgical History:  Procedure Laterality Date   COLONOSCOPY WITH PROPOFOL N/A 07/23/2015   Procedure: COLONOSCOPY WITH PROPOFOL;  Surgeon: Ronald Lobo, MD;  Location: [facility];  Service: Endoscopy;  Laterality: N/A;   ESOPHAGOGASTRODUODENOSCOPY (EGD) WITH PROPOFOL N/A 07/23/2015   Procedure: ESOPHAGOGASTRODUODENOSCOPY (EGD) WITH PROPOFOL;  Surgeon: Ronald Lobo, MD;  Location: [facility];  Service: Endoscopy;  Laterality: N/A;   ESOPHAGOGASTRODUODENOSCOPY (EGD) WITH PROPOFOL N/A 11/18/2015   Procedure: ESOPHAGOGASTRODUODENOSCOPY (EGD) WITH PROPOFOL;  Surgeon: Ronald Lobo, MD;  Location: [facility];  Service: Endoscopy;  Laterality: N/A;   RIGHT/LEFT HEART CATH AND CORONARY ANGIOGRAPHY N/A 05/22/2016   Procedure: Right/Left Heart Cath and Coronary Angiography;  Surgeon: Peter M Martinique, MD;  Location: [facility];  Service: Cardiovascular;  Laterality: N/A;   SUBQ ICD IMPLANT N/A 09/27/2016   Procedure: SubQ ICD Implant;  Surgeon: Evans Lance, MD;  Location: [facility];  Service: Cardiovascular;  Laterality: N/A;    MEDICATIONS: Current Outpatient Medications on File Prior to Visit  Medication Sig Dispense Refill   aspirin EC 81 MG tablet  Take 1 tablet (  81 mg total) by mouth daily. 90 tablet 3   buprenorphine-naloxone (SUBOXONE) 8-2 mg SUBL SL tablet Place 1 tablet under the tongue 2 (two) times daily as needed. 60 tablet 0   butalbital-acetaminophen-caffeine (FIORICET) 50-325-40 MG tablet Take 1 to 2 tablets by mouth twice daily as needed for headache or migraine. 30 tablet 0   carvedilol (COREG) 12.5 MG tablet Take 1 tablet (12.5 mg total) by mouth 2 (two) times daily with a meal. 180 tablet 3   dapagliflozin propanediol (FARXIGA) 10 MG TABS tablet Take 1 tablet (10 mg total) by mouth daily before breakfast. 30 tablet 6   ferrous sulfate 325 (65 FE) MG tablet Take 325 mg by mouth daily with breakfast.     furosemide (LASIX) 20 MG tablet Take 2 tablets (40 mg total) by mouth as needed. 60 tablet 2   methocarbamol (ROBAXIN) 500 MG tablet Take 1 tablet (500 mg total) by mouth 2 (two) times daily as needed for muscle spasms. 20 tablet 0   ondansetron (ZOFRAN ODT) 4 MG disintegrating tablet Take 1 tablet (4 mg total) by mouth every 8 (eight) hours as needed for nausea or vomiting. 20 tablet 0   PARoxetine (PAXIL) 20 MG tablet Take 1 tablet (20 mg total) by mouth daily. 30 tablet 3   Potassium Chloride ER 20 MEQ TBCR Take 20 mEq by mouth daily. 90 tablet 3   sacubitril-valsartan (ENTRESTO) 49-51 MG Take 1 tablet by mouth 2 (two) times daily. 180 tablet 3   spironolactone (ALDACTONE) 25 MG tablet Take 0.5 tablets (12.5 mg total) by mouth daily. 45 tablet 3   Topiramate ER (TROKENDI XR) 25 MG CP24 Take 1 capsule (25 mg total) by mouth daily. 30 capsule 1   traZODone (DESYREL) 100 MG tablet Take 1 to 2 tablets at bedtime as needed 60 tablet 3   [DISCONTINUED] SUMAtriptan (IMITREX) 25 MG tablet Take 2 tablets (50 mg total) by mouth every 2 (two) hours as needed for migraine. May repeat in 2 hours if headache persists or recurs. 10 tablet 0   No current facility-administered medications on file prior to visit.    ALLERGIES: Allergies   Allergen Reactions   Tramadol Itching and Other (See Comments)    Red spots all over body and itching   Furosemide Rash   Leflunomide Rash    FAMILY HISTORY: Family History  Problem Relation Age of Onset   Cancer Paternal Grandfather    Mental illness Neg Hx     Objective:  *** General: No acute distress.  Patient appears well-groomed.   Head:  Normocephalic/atraumatic Eyes:  fundi examined but not visualized Neck: supple, no paraspinal tenderness, full range of motion Back: No paraspinal tenderness Heart: regular rate and rhythm Lungs: Clear to auscultation bilaterally. Vascular: No carotid bruits. Neurological Exam: Mental status: alert and oriented to person, place, and time, speech fluent and not dysarthric, language intact. Cranial nerves: CN I: not tested CN II: pupils equal, round and reactive to light, visual fields intact CN III, IV, VI:  full range of motion, no nystagmus, no ptosis CN V: facial sensation intact. CN VII: upper and lower face symmetric CN VIII: hearing intact CN IX, X: gag intact, uvula midline CN XI: sternocleidomastoid and trapezius muscles intact CN XII: tongue midline Bulk & Tone: normal, no fasciculations. Motor:  muscle strength 5/5 throughout Sensation:  Pinprick, temperature and vibratory sensation intact. Deep Tendon Reflexes:  2+ throughout,  toes downgoing.   Finger to nose testing:  Without dysmetria.  Heel to shin:  Without dysmetria.   Gait:  Normal station and stride.  Romberg negative.    Thank you for allowing me to take part in the care of this patient.  Metta Clines, DO  CC: ***

## 2021-11-29 ENCOUNTER — Ambulatory Visit: Payer: Self-pay | Admitting: Neurology

## 2021-11-29 ENCOUNTER — Encounter: Payer: Self-pay | Admitting: Neurology

## 2021-11-29 DIAGNOSIS — Z029 Encounter for administrative examinations, unspecified: Secondary | ICD-10-CM

## 2021-12-02 ENCOUNTER — Other Ambulatory Visit (HOSPITAL_COMMUNITY): Payer: Self-pay

## 2021-12-02 ENCOUNTER — Ambulatory Visit: Payer: Self-pay | Attending: Internal Medicine | Admitting: Internal Medicine

## 2021-12-02 ENCOUNTER — Other Ambulatory Visit: Payer: Self-pay

## 2021-12-02 ENCOUNTER — Encounter: Payer: Self-pay | Admitting: Internal Medicine

## 2021-12-02 VITALS — BP 110/74 | HR 78 | Ht 61.5 in | Wt 141.0 lb

## 2021-12-02 DIAGNOSIS — M79674 Pain in right toe(s): Secondary | ICD-10-CM

## 2021-12-02 DIAGNOSIS — K0889 Other specified disorders of teeth and supporting structures: Secondary | ICD-10-CM

## 2021-12-02 NOTE — Progress Notes (Signed)
Patient ID: Kathy Rosales, female    DOB: [birth year]  MRN: [id number]  CC: bleeding gums , Jaw Pain, and toe swelling   Subjective: Kathy Rosales is a 46 y.o. 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  Followed by [facility] Rheumatology), migraine HA.   Pt c/o bleeding gums RT lower gum  at nights x 2 wks. No bleeding when she brushes but wakes up at nights at 12 a.m to take meds.  That's when she notices it.  Some pain in the gum line RT lower jaw x 1 wk.  No cavities that she is aware of. She brushes and floss regularly.   Last saw a dentist 08/2020.  Not on any blood thinners.  Takes ASA  C/o swelling RT 5th toe x 3 wks. no known trauma. When it swells it is painful and hurts to wear shoes. Still followed by rheumatology at [place].   Patient Active Problem List   Diagnosis Date Noted   Recurrent major depressive disorder, in full remission ([place]) 06/04/2020   Vitamin D deficiency 06/18/2019   Migraine without aura and without status migrainosus, not intractable 05/16/2019   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   Rheumatoid arthritis of multiple sites with negative rheumatoid factor ([place]) 11/19/2017   Left-sided weakness    Unresponsive    Chronic systolic heart failure ([place]) 05/22/2016   Mitral regurgitation 05/22/2016   Near syncope 05/22/2016   Delirium due to multiple etiologies, persistent, mixed level of activity 12/13/2015   MDD (major depressive disorder), single episode, moderate ([place]) 12/13/2015   Fatigue 02/72/5366   Helicobacter positive gastritis 08/06/2015   Gastric ulcer 08/06/2015   Iron deficiency anemia due to chronic blood loss 08/06/2015   Anemia 07/21/2015   Iron deficiency anemia 10/22/2014   B12 deficiency 10/22/2014   Symptomatic anemia  08/28/2014   Headache 08/28/2014   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 90 tablet 3   buprenorphine-naloxone (SUBOXONE) 8-2 mg SUBL SL tablet Place 1 tablet under the tongue 2 (two) times daily as needed. 60 tablet 0   butalbital-acetaminophen-caffeine (FIORICET) 50-325-40 MG tablet Take 1 to 2 tablets by mouth twice daily as needed for headache or migraine. 30 tablet 0   carvedilol (COREG) 12.5 MG tablet Take 1 tablet (12.5 mg total) by mouth 2 (two) times daily with a meal. 180 tablet 3   dapagliflozin propanediol (FARXIGA) 10 MG TABS tablet Take 1 tablet (10 mg total) by mouth daily before breakfast. 30 tablet 6   ferrous sulfate 325 (65 FE) MG tablet Take 325 mg by mouth daily with breakfast.     furosemide (LASIX) 20 MG tablet Take 2 tablets (40 mg total) by mouth as needed. 60 tablet 2   methocarbamol (ROBAXIN) 500 MG tablet Take 1 tablet (500 mg total) by mouth 2 (two) times daily as needed for muscle spasms. 20 tablet 0   ondansetron (ZOFRAN ODT) 4 MG disintegrating tablet Take 1 tablet (4 mg total) by mouth every 8 (eight) hours as needed for nausea or vomiting. 20 tablet 0   PARoxetine (PAXIL) 20 MG tablet Take 1 tablet (20 mg total)  by mouth daily. 30 tablet 3   Potassium Chloride ER 20 MEQ TBCR Take 20 mEq by mouth daily. 90 tablet 3   sacubitril-valsartan (ENTRESTO) 49-51 MG Take 1 tablet by mouth 2 (two) times daily. 180 tablet 3   spironolactone (ALDACTONE) 25 MG tablet Take 0.5 tablets (12.5 mg total) by mouth daily. 45 tablet 3   Topiramate ER (TROKENDI XR) 25 MG CP24 Take 1 capsule (25 mg total) by mouth daily. 30 capsule 1   traZODone (DESYREL) 100 MG tablet Take 1 to 2 tablets at bedtime as needed 60 tablet 3   [DISCONTINUED] SUMAtriptan (IMITREX) 25 MG tablet Take 2 tablets (50 mg total) by mouth every 2 (two) hours as  needed for migraine. May repeat in 2 hours if headache persists or recurs. 10 tablet 0   No current facility-administered medications on file prior to visit.    Allergies  Allergen Reactions   Tramadol Itching and Other (See Comments)    Red spots all over body and itching   Furosemide Rash   Leflunomide Rash    Social History   Socioeconomic History   Marital status: Single    Spouse name: Not on file   Number of children: 1   Years of education: Not on file   Highest education level: Not on file  Occupational History   Occupation: unemployeed  Tobacco Use   Smoking status: Never   Smokeless tobacco: Never  Vaping Use   Vaping Use: Never used  Substance and Sexual Activity   Alcohol use: No   Drug use: No   Sexual activity: Not Currently    Birth control/protection: None  Other Topics Concern   Not on file  Social History Narrative   ** Merged History Encounter **       ** Merged History Encounter **       Social Determinants of Health   Financial Resource Strain: High Risk (07/08/2021)   Overall Financial Resource Strain (CARDIA)    Difficulty of Paying Living Expenses: Hard  Food Insecurity: Food Insecurity Present (10/12/2020)   Hunger Vital Sign    Worried About Running Out of Food in the Last Year: Sometimes true    Ran Out of Food in the Last Year: Sometimes true  Transportation Needs: No Transportation Needs (10/12/2020)   PRAPARE - Hydrologist (Medical): No    Lack of Transportation (Non-Medical): No  Physical Activity: Not on file  Stress: Not on file  Social Connections: Not on file  Intimate Partner Violence: Not on file    Family History  Problem Relation Age of Onset   Cancer Paternal Grandfather    Mental illness Neg Hx     Past Surgical History:  Procedure Laterality Date   COLONOSCOPY WITH PROPOFOL N/A 07/23/2015   Procedure: COLONOSCOPY WITH PROPOFOL;  Surgeon: Ronald Lobo, MD;  Location: [facility];   Service: Endoscopy;  Laterality: N/A;   ESOPHAGOGASTRODUODENOSCOPY (EGD) WITH PROPOFOL N/A 07/23/2015   Procedure: ESOPHAGOGASTRODUODENOSCOPY (EGD) WITH PROPOFOL;  Surgeon: Ronald Lobo, MD;  Location: [facility];  Service: Endoscopy;  Laterality: N/A;   ESOPHAGOGASTRODUODENOSCOPY (EGD) WITH PROPOFOL N/A 11/18/2015   Procedure: ESOPHAGOGASTRODUODENOSCOPY (EGD) WITH PROPOFOL;  Surgeon: Ronald Lobo, MD;  Location: [facility];  Service: Endoscopy;  Laterality: N/A;   RIGHT/LEFT HEART CATH AND CORONARY ANGIOGRAPHY N/A 05/22/2016   Procedure: Right/Left Heart Cath and Coronary Angiography;  Surgeon: Peter M Martinique, MD;  Location: [place] [facility];  Service: Cardiovascular;  Laterality: N/A;  SUBQ ICD IMPLANT N/A 09/27/2016   Procedure: SubQ ICD Implant;  Surgeon: Evans Lance, MD;  Location: [place] [facility];  Service: Cardiovascular;  Laterality: N/A;    ROS: Review of Systems Negative except as stated above  PHYSICAL EXAM: BP 110/74   Pulse 78   Ht 5' 1.5" (1.562 m)   Wt 141 lb (64 kg)   SpO2 96%   BMI 26.21 kg/m   Physical Exam  General appearance - alert, well appearing, middle-aged Hispanic female and in no distress Mental status - normal mood, behavior, speech, dress, motor activity, and thought processes Mouth -patient has fillings in the right third and fourth molar in the lower jaw.  Gumline around the third molar is irritated and sensitive to touch; there is some receding of the gumline and discoloration of the teeth at the receding gumline and sensitive to touch.  No peridental abscess appreciated. Neck - supple, no significant adenopathy Musculoskeletal -right small toe: No edema or erythema noted.  She has what looks like a small callused area on the medial aspect of the toe that touches the fourth digit.  This area is tender to touch.      Latest Ref Rng & Units 10/28/2021    9:47 AM 08/05/2021    9:34 AM 07/08/2021   11:18 AM  CMP  Glucose 70 - 99 mg/dL 97  88   95   BUN 6 - 20 mg/dL '9  11  10   '$ Creatinine 0.44 - 1.00 mg/dL 0.60  0.63  0.59   Sodium 135 - 145 mmol/L 138  137  135   Potassium 3.5 - 5.1 mmol/L 3.9  4.0  3.7   Chloride 98 - 111 mmol/L 106  110  107   CO2 22 - 32 mmol/L '25  22  23   '$ Calcium 8.9 - 10.3 mg/dL 8.8  8.6  8.2    Lipid Panel     Component Value Date/Time   CHOL 149 12/14/2015 0628   TRIG 180 (H) 12/14/2015 0628   HDL 40 (L) 12/14/2015 0628   CHOLHDL 3.7 12/14/2015 0628   VLDL 36 12/14/2015 0628   LDLCALC 73 12/14/2015 0628    CBC    Component Value Date/Time   WBC 4.1 06/06/2021 1656   WBC 17.9 (H) 06/21/2020 1416   RBC 4.35 06/06/2021 1656   RBC 3.69 (L) 06/21/2020 1416   HGB 12.5 06/06/2021 1656   HGB 11.2 (L) 11/30/2015 0944   HCT 36.5 06/06/2021 1656   HCT 36.5 11/30/2015 0944   PLT 195 06/06/2021 1656   MCV 84 06/06/2021 1656   MCV 83.0 11/30/2015 0944   MCH 28.7 06/06/2021 1656   MCH 28.5 06/21/2020 1416   MCHC 34.2 06/06/2021 1656   MCHC 31.5 06/21/2020 1416   RDW 13.6 06/06/2021 1656   RDW 19.4 (H) 11/30/2015 0944   LYMPHSABS 0.9 06/21/2020 1416   LYMPHSABS 0.8 06/04/2020 1007   LYMPHSABS 0.4 (L) 11/30/2015 0944   MONOABS 0.5 06/21/2020 1416   MONOABS 0.2 11/30/2015 0944   EOSABS 0.1 06/21/2020 1416   EOSABS 0.2 06/04/2020 1007   BASOSABS 0.1 06/21/2020 1416   BASOSABS 0.1 06/04/2020 1007   BASOSABS 0.0 11/30/2015 0944    ASSESSMENT AND PLAN:  1. Pain, dental This teeth looks like it may be decayed at the gumline level.  I recommend that she sees a dentist.  Take Tylenol as needed for pain. - Ambulatory referral to Dentistry  2. Toe pain, right Questionable  Morton's neuroma.  Will refer to podiatry - Ambulatory referral to Podiatry    Patient was given the opportunity to ask questions.  Patient verbalized understanding of the plan and was able to repeat key elements of the plan.   This documentation was completed using Radio producer.  Any transcriptional  errors are unintentional.  No orders of the defined types were placed in this encounter.    Requested Prescriptions    No prescriptions requested or ordered in this encounter    No follow-ups on file.  Karle Plumber, MD, FACP

## 2021-12-02 NOTE — Progress Notes (Signed)
Patient declined flu shot this visit

## 2021-12-07 ENCOUNTER — Inpatient Hospital Stay (HOSPITAL_COMMUNITY): Admission: RE | Admit: 2021-12-07 | Payer: Self-pay | Source: Ambulatory Visit

## 2021-12-13 ENCOUNTER — Ambulatory Visit (INDEPENDENT_AMBULATORY_CARE_PROVIDER_SITE_OTHER): Payer: Self-pay

## 2021-12-13 DIAGNOSIS — I428 Other cardiomyopathies: Secondary | ICD-10-CM

## 2021-12-15 LAB — CUP PACEART REMOTE DEVICE CHECK
Battery Remaining Percentage: 41 %
Date Time Interrogation Session: 20230912174500
Implantable Lead Implant Date: 20180627
Implantable Lead Location: 753862
Implantable Lead Model: 3401
Implantable Lead Serial Number: 113145
Implantable Pulse Generator Implant Date: 20180627
Pulse Gen Serial Number: 222625

## 2021-12-28 ENCOUNTER — Other Ambulatory Visit (HOSPITAL_COMMUNITY): Payer: Self-pay

## 2021-12-28 ENCOUNTER — Encounter: Payer: Self-pay | Admitting: Hematology

## 2021-12-28 MED ORDER — BUPRENORPHINE HCL-NALOXONE HCL 8-2 MG SL SUBL
1.0000 | SUBLINGUAL_TABLET | Freq: Two times a day (BID) | SUBLINGUAL | 0 refills | Status: DC | PRN
  Filled 2021-12-28: qty 53, 27d supply, fill #0
  Filled 2021-12-29 – 2021-12-30 (×2): qty 60, 30d supply, fill #0

## 2021-12-29 ENCOUNTER — Other Ambulatory Visit (HOSPITAL_COMMUNITY): Payer: Self-pay

## 2021-12-29 ENCOUNTER — Encounter: Payer: Self-pay | Admitting: Hematology

## 2021-12-29 NOTE — Progress Notes (Signed)
Remote ICD transmission.   

## 2021-12-30 ENCOUNTER — Other Ambulatory Visit: Payer: Self-pay

## 2021-12-30 ENCOUNTER — Other Ambulatory Visit (HOSPITAL_COMMUNITY): Payer: Self-pay

## 2021-12-30 ENCOUNTER — Ambulatory Visit (INDEPENDENT_AMBULATORY_CARE_PROVIDER_SITE_OTHER): Payer: Self-pay | Admitting: Podiatry

## 2021-12-30 ENCOUNTER — Ambulatory Visit: Payer: Self-pay | Admitting: Podiatry

## 2021-12-30 ENCOUNTER — Encounter: Payer: Self-pay | Admitting: Hematology

## 2021-12-30 DIAGNOSIS — Z79899 Other long term (current) drug therapy: Secondary | ICD-10-CM

## 2021-12-30 DIAGNOSIS — B351 Tinea unguium: Secondary | ICD-10-CM

## 2021-12-30 NOTE — Progress Notes (Signed)
Subjective:  Patient ID: Kathy Rosales, female    DOB: [birth year],  MRN: [id number]  Chief Complaint  Patient presents with   Toe Pain    Place between the toe     46 y.o. female presents with the above complaint.  Patient presents with left hallux thickened elongated dystrophic toenails.  Patient states that it has progressed gotten worse.  She wanted to discuss treatment options for it.  She has not seen anyone else prior to seeing me for this.  Denies any other acute complaints.  Occasional pain with palpation.  She has tried some counter medication which has not helped   Review of Systems: Negative except as noted in the HPI. Denies N/V/F/Ch.  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[place])     Current Outpatient Medications:    aspirin EC 81 MG tablet, Take 1 tablet (81 mg total) by mouth daily., Disp: 90 tablet, Rfl: 3   buprenorphine-naloxone (SUBOXONE) 8-2 mg SUBL SL tablet, Place 1 tablet under the tongue 2 (two) times daily as needed., Disp: 60 tablet, Rfl: 0   butalbital-acetaminophen-caffeine (FIORICET) 50-325-40 MG tablet, Take 1 to 2 tablets by mouth twice daily as needed for headache or migraine., Disp: 30 tablet, Rfl: 0   carvedilol (COREG) 12.5 MG tablet, Take 1 tablet (12.5 mg total) by mouth 2 (two) times daily with a meal., Disp: 180 tablet, Rfl: 3   dapagliflozin propanediol (FARXIGA) 10 MG TABS tablet, Take 1 tablet (10 mg total) by mouth daily before breakfast., Disp: 30 tablet, Rfl: 6   ferrous sulfate 325 (65 FE) MG tablet, Take 325 mg by mouth daily with breakfast., Disp: , Rfl:    furosemide (LASIX) 20 MG tablet, Take 2 tablets (40 mg total) by mouth as needed., Disp: 60 tablet, Rfl: 2   methocarbamol (ROBAXIN) 500 MG tablet, Take 1 tablet (500 mg total) by mouth 2 (two) times daily as needed for muscle spasms., Disp:  20 tablet, Rfl: 0   ondansetron (ZOFRAN ODT) 4 MG disintegrating tablet, Take 1 tablet (4 mg total) by mouth every 8 (eight) hours as needed for nausea or vomiting., Disp: 20 tablet, Rfl: 0   PARoxetine (PAXIL) 20 MG tablet, Take 1 tablet (20 mg total) by mouth daily., Disp: 30 tablet, Rfl: 3   Potassium Chloride ER 20 MEQ TBCR, Take 20 mEq by mouth daily., Disp: 90 tablet, Rfl: 3   sacubitril-valsartan (ENTRESTO) 49-51 MG, Take 1 tablet by mouth 2 (two) times daily., Disp: 180 tablet, Rfl: 3   spironolactone (ALDACTONE) 25 MG tablet, Take 0.5 tablets (12.5 mg total) by mouth daily., Disp: 45 tablet, Rfl: 3   Topiramate ER (TROKENDI XR) 25 MG CP24, Take 1 capsule (25 mg total) by mouth daily., Disp: 30 capsule, Rfl: 1   traZODone (DESYREL) 100 MG tablet, Take 1 to 2 tablets at bedtime as needed, Disp: 60 tablet, Rfl: 3  Social History   Tobacco Use  Smoking Status Never  Smokeless Tobacco Never    Allergies  Allergen Reactions   Tramadol Itching and Other (See Comments)    Red spots all over body and itching   Furosemide Rash   Leflunomide Rash   Objective:  There were no vitals filed for this visit. There is no height or weight on file to calculate BMI. Constitutional Well developed. Well nourished.  Vascular Dorsalis pedis pulses palpable  bilaterally. Posterior tibial pulses palpable bilaterally. Capillary refill normal to all digits.  No cyanosis or clubbing noted. Pedal hair growth normal.  Neurologic Normal speech. Oriented to person, place, and time. Epicritic sensation to light touch grossly present bilaterally.  Dermatologic Nails thickened elongated dystrophic mycotic toenails x1 left hallux.  Mild pain on palpation Skin within normal limits  Orthopedic: Normal joint ROM without pain or crepitus bilaterally. No visible deformities. No bony tenderness.   Radiographs: None Assessment:   1. Long-term use of high-risk medication    Plan:  Patient was evaluated and  treated and all questions answered.  Left hallux onychomycosis -Educated the patient on the etiology of onychomycosis and various treatment options associated with improving the fungal load.  I explained to the patient that there is 3 treatment options available to treat the onychomycosis including topical, p.o., laser treatment.  Patient elected to undergo p.o. options with Lamisil/terbinafine therapy.  In order for me to start the medication therapy, I explained to the patient the importance of evaluating the liver and obtaining the liver function test.  Once the liver function test comes back normal I will start him on 67-monthcourse of Lamisil therapy.  Patient understood all risk and would like to proceed with Lamisil therapy.  I have asked the patient to immediately stop the Lamisil therapy if she has any reactions to it and call the office or go to the emergency room right away.  Patient states understanding   No follow-ups on file.

## 2021-12-31 LAB — HEPATIC FUNCTION PANEL
AG Ratio: 1.7 (calc) (ref 1.0–2.5)
ALT: 15 U/L (ref 6–29)
AST: 19 U/L (ref 10–35)
Albumin: 4.3 g/dL (ref 3.6–5.1)
Alkaline phosphatase (APISO): 46 U/L (ref 31–125)
Bilirubin, Direct: 0.1 mg/dL (ref 0.0–0.2)
Globulin: 2.5 g/dL (calc) (ref 1.9–3.7)
Indirect Bilirubin: 0.3 mg/dL (calc) (ref 0.2–1.2)
Total Bilirubin: 0.4 mg/dL (ref 0.2–1.2)
Total Protein: 6.8 g/dL (ref 6.1–8.1)

## 2022-01-02 ENCOUNTER — Other Ambulatory Visit: Payer: Self-pay

## 2022-01-02 ENCOUNTER — Encounter: Payer: Self-pay | Admitting: Hematology

## 2022-01-02 MED ORDER — TERBINAFINE HCL 250 MG PO TABS
250.0000 mg | ORAL_TABLET | Freq: Every day | ORAL | 0 refills | Status: DC
  Filled 2022-01-02: qty 30, 30d supply, fill #0
  Filled 2022-01-31: qty 22, 22d supply, fill #0
  Filled 2022-01-31: qty 68, 68d supply, fill #0

## 2022-01-02 NOTE — Addendum Note (Signed)
Addended by: Boneta Lucks on: 01/02/2022 08:17 AM   Modules accepted: Orders

## 2022-01-09 ENCOUNTER — Other Ambulatory Visit: Payer: Self-pay

## 2022-01-11 ENCOUNTER — Telehealth: Payer: Self-pay | Admitting: Internal Medicine

## 2022-01-16 ENCOUNTER — Telehealth (HOSPITAL_COMMUNITY): Payer: No Payment, Other | Admitting: Psychiatry

## 2022-01-19 NOTE — Telephone Encounter (Signed)
Letter printed out and left at front desk.  Left this message on voicemail.

## 2022-01-31 ENCOUNTER — Other Ambulatory Visit (HOSPITAL_COMMUNITY): Payer: Self-pay

## 2022-01-31 ENCOUNTER — Encounter: Payer: Self-pay | Admitting: Hematology

## 2022-01-31 ENCOUNTER — Other Ambulatory Visit: Payer: Self-pay

## 2022-01-31 MED ORDER — BUPRENORPHINE HCL-NALOXONE HCL 8-2 MG SL SUBL
1.0000 | SUBLINGUAL_TABLET | Freq: Two times a day (BID) | SUBLINGUAL | 0 refills | Status: DC | PRN
  Filled 2022-01-31: qty 60, 30d supply, fill #0

## 2022-02-03 ENCOUNTER — Ambulatory Visit: Payer: Self-pay | Admitting: Physician Assistant

## 2022-02-03 NOTE — Progress Notes (Deleted)
Cardiology Office Note Date:  02/03/2022  Patient ID:  Kathy Rosales, DOB 04-May-1975, MRN [id number] PCP:  Ladell Pier, MD  Cardiologist:  Dr. Aundra Dubin Electrophysiologist: Dr. Lovena Le    Chief Complaint:  *** 6 mo  History of Present Illness: Kathy Rosales is a 46 y.o. female with history of NICM, migraine HA's, depression   She was admitted for outpatient implantation of a S-ICD in June 2018. She developed in post procedure area AMS/became unresponsive, neurology consult and w/u was undertaken.  The patient had very inconsistent symptoms and observations through the day/evening, CT head, CTA head/neck and EEG were all negative, her symptoms did eventually completely resolve and neurology signed off suspected to have been slow response to anesthesia or psychogenic.  She had hx of a having a CPX 07/15/16. Results as below. Immediately after, pt sat in chair and then had a pseudoseizure. No tonic/clonic activity. Pt taken to ED with Neurology consulted. Pt recovered spontaneously recovered. CT angio chest and CT head  unremarkable.    She saw Dr. Lovena Le Feb 2022, she was doing OK, described class II symptoms, LVEF had some improvement over the years, Last LVEF was 35-40% and recommended she keep her device until ERI  I saw her 06/06/21 She is doing OK, though in the last few weeks has been getting fatigued earlier on her walks. Usually does 5 miles every day walking but lately getting tierd at about 2 miles or so. No trouble with ADLs or usual activities No CP of any kind No rest SOB No near syncope or syncope. She does not feel like she is retaining water No missed medicines No changes of late with anything She is certain she is not pregnant Planned for labs/echo  LVEF 35-4-%, grade II DD, recommended Entresto be increased and re-establish with HF team.  She has seen HF a few times since then, most recently Dr. Aundra Dubin 11/11/21, doing OK, declined  fatigue/symptoms. Dig was stopped Spironolactone started with labs to follow  *** device *** symptoms, syncope *** volume *** needs a BMET   Device information: [employer]i S-ICD implanted 09/28/16, Dr. 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[facility])     Past Surgical History:  Procedure Laterality Date   COLONOSCOPY WITH PROPOFOL N/A 07/23/2015   Procedure: COLONOSCOPY WITH PROPOFOL;  Surgeon: Ronald Lobo, MD;  Location: [facility];  Service: Endoscopy;  Laterality: N/A;   ESOPHAGOGASTRODUODENOSCOPY (EGD) WITH PROPOFOL N/A 07/23/2015   Procedure: ESOPHAGOGASTRODUODENOSCOPY (EGD) WITH PROPOFOL;  Surgeon: Ronald Lobo, MD;  Location: [facility];  Service: Endoscopy;  Laterality: N/A;   ESOPHAGOGASTRODUODENOSCOPY (EGD) WITH PROPOFOL N/A 11/18/2015   Procedure: ESOPHAGOGASTRODUODENOSCOPY (EGD) WITH PROPOFOL;  Surgeon: Ronald Lobo, MD;  Location: [facility];  Service: Endoscopy;  Laterality: N/A;   RIGHT/LEFT HEART CATH AND CORONARY ANGIOGRAPHY N/A 05/22/2016   Procedure: Right/Left Heart Cath and Coronary Angiography;  Surgeon: Peter M Martinique, MD;  Location: [facility];  Service: Cardiovascular;  Laterality: N/A;   SUBQ ICD IMPLANT N/A 09/27/2016   Procedure: SubQ ICD Implant;  Surgeon: Evans Lance, MD;  Location: [facility];  Service: Cardiovascular;  Laterality: N/A;    Current Outpatient Medications  Medication Sig Dispense Refill   aspirin EC 81 MG tablet Take 1  tablet (81 mg total) by mouth daily. 90 tablet 3   buprenorphine-naloxone (SUBOXONE) 8-2 mg SUBL SL tablet Place 1 tablet under the tongue 2 (two) times daily as needed. 60 tablet 0   butalbital-acetaminophen-caffeine (FIORICET) 50-325-40 MG tablet Take 1  to 2 tablets by mouth twice daily as needed for headache or migraine. 30 tablet 0   carvedilol (COREG) 12.5 MG tablet Take 1 tablet (12.5 mg total) by mouth 2 (two) times daily with a meal. 180 tablet 3   dapagliflozin propanediol (FARXIGA) 10 MG TABS tablet Take 1 tablet (10 mg total) by mouth daily before breakfast. 30 tablet 6   ferrous sulfate 325 (65 FE) MG tablet Take 325 mg by mouth daily with breakfast.     furosemide (LASIX) 20 MG tablet Take 2 tablets (40 mg total) by mouth as needed. 60 tablet 2   methocarbamol (ROBAXIN) 500 MG tablet Take 1 tablet (500 mg total) by mouth 2 (two) times daily as needed for muscle spasms. 20 tablet 0   ondansetron (ZOFRAN ODT) 4 MG disintegrating tablet Take 1 tablet (4 mg total) by mouth every 8 (eight) hours as needed for nausea or vomiting. 20 tablet 0   PARoxetine (PAXIL) 20 MG tablet Take 1 tablet (20 mg total) by mouth daily. 30 tablet 3   Potassium Chloride ER 20 MEQ TBCR Take 20 mEq by mouth daily. 90 tablet 3   sacubitril-valsartan (ENTRESTO) 49-51 MG Take 1 tablet by mouth 2 (two) times daily. 180 tablet 3   spironolactone (ALDACTONE) 25 MG tablet Take 0.5 tablets (12.5 mg total) by mouth daily. 45 tablet 3   terbinafine (LAMISIL) 250 MG tablet Take 1 tablet (250 mg total) by mouth daily. 90 tablet 0   Topiramate ER (TROKENDI XR) 25 MG CP24 Take 1 capsule (25 mg total) by mouth daily. 30 capsule 1   traZODone (DESYREL) 100 MG tablet Take 1 to 2 tablets at bedtime as needed 60 tablet 3   No current facility-administered medications for this visit.    Allergies:   Tramadol, Furosemide, and Leflunomide   Social History:  The patient  reports that she has never smoked. She has never used smokeless tobacco. She reports that she does not drink alcohol and does not use drugs.   Family History:  The patient's family history includes Cancer in her paternal grandfather.  ROS:  Please see the history of present illness.  All other systems are  reviewed and otherwise negative.   PHYSICAL EXAM:  VS:  There were no vitals taken for this visit. BMI: There is no height or weight on file to calculate BMI. Well nourished, well developed, in no acute distress  HEENT: normocephalic, atraumatic  Neck: no JVD, carotid bruits or masses Cardiac:   *** RRR; no significant murmurs, no rubs, or gallops Lungs: ***  CTA b/l , no wheezing, rhonchi or rales  Abd: soft, nontender MS: no deformity or atrophy Ext:  *** no edema  Skin: warm and dry, no rash Neuro:  No gross deficits appreciated Psych: euthymic mood, full affect  *** S-ICD site is good: no skin changes, thinning, tethering or tenderness.   EKG:  not done today   ICD interrogation done today and reviewed by myself:  ***   06/17/21: TTE  1. Left ventricular ejection fraction, by estimation, is 35 to 40%. The  left ventricle has moderately decreased function. The left ventricle  demonstrates global hypokinesis. The left ventricular internal cavity size  was mildly dilated. Left ventricular  diastolic parameters are consistent with Grade II diastolic dysfunction  (pseudonormalization).   2. Right ventricular systolic function is normal. The right ventricular  size is normal. There is normal pulmonary artery systolic pressure. The  estimated right ventricular systolic pressure is 32.3 mmHg.   3. Left atrial size was moderately dilated.   4. The mitral valve is normal in structure. Mild mitral valve  regurgitation. No evidence of mitral stenosis.   5. The aortic valve is tricuspid. Aortic valve regurgitation is trivial.  Aortic valve sclerosis/calcification is present, without any evidence of  aortic stenosis.   6. The inferior vena cava is normal in size with greater than 50%  respiratory variability, suggesting right atrial pressure of 3 mmHg.    04/16/2020: TTE IMPRESSIONS   1. Left ventricular ejection fraction, by estimation, is 35 to 40%. The  left ventricle has  moderately decreased function. The left ventricle  demonstrates global hypokinesis. The left ventricular internal cavity size  was mildly dilated. Left ventricular  diastolic parameters are consistent with Grade II diastolic dysfunction  (pseudonormalization).   2. Right ventricular systolic function is normal. The right ventricular  size is normal. There is normal pulmonary artery systolic pressure.   3. Left atrial size was mildly dilated.   4. The mitral valve is normal in structure. Trivial mitral valve  regurgitation. No evidence of mitral stenosis.   5. The aortic valve is tricuspid. Aortic valve regurgitation is not  visualized. No aortic stenosis is present.   6. The inferior vena cava is normal in size with greater than 50%  respiratory variability, suggesting right atrial pressure of 3 mmHg.    CPX 07/18/16 Pre-Exercise PFTs  FVC 2.79 (92%)      FEV1 2.38 (93%)        FEV1/FVC 85 (101%)        MVV 48 (49%) Exercise Time:    3:15   Speed (mph): 1.5      Grade (%): 0    RPE: 17 Reason stopped: Patient ended test due to Dyspnea (8/10) Additional symptoms: lightheaded (9/10), chest pressure (9/10) Resting HR: 84 Peak HR: 95   (53% age predicted max HR) BP rest: 92/62 BP peak: 88/60 Peak VO2: 14.1 (44% predicted peak VO2) VE/VCO2 slope:  59.4 OUES: 0.73 Peak RER: 0.96 Ventilatory Threshold: Not achieved  Peak RR 77 Peak Ventilation:  28.8 VE/MVV:  60% PETCO2 at peak:  28 O2pulse:  8   80% predicted O2pulse)   RHC/LHC 2/19/018  RA 3 PCWP  20 CO/CI 4.43/2.88 LHC normal cors.    12/10/2015 TSH 2.7    ECHO 04/2016 EF 20-25%. Mod-Severe MR, Mod TR.  ECHO 4/18 EF 202-5%, moderate diastolic dysfunction, PASP 31 mmHg.    Recent Labs: 06/06/2021: Hemoglobin 12.5; Platelets 195 10/28/2021: BUN 9; Creatinine, Ser 0.60; Potassium 3.9; Sodium 138 12/30/2021: ALT 15  No results found for requested labs within last 365 days.   CrCl cannot be calculated (Patient's most recent  lab result is older than the maximum 21 days allowed.).   Wt Readings from Last 3 Encounters:  12/02/21 141 lb (64 kg)  11/11/21 143 lb 6.4 oz (65 kg)  10/28/21 141 lb 6.4 oz (64.1 kg)     Other studies reviewed: Additional studies/records reviewed today include: summarized above  ASSESSMENT AND PLAN:  1. NICM      *** Exam is euvolemic, no symptoms of fluid OL     *** On BB/entresto, dig, diuretic tx  2. S-ICD     ***  Intact function, no programming changes     *** She is up to date with remotes       Disposition: ***   Current medicines are reviewed at length with the patient today.  The patient did not have any concerns regarding medicines.  Haywood Lasso, PA-C 02/03/2022 3:28 AM     [place] [id number] [phone] (office)  [phone] (fax)

## 2022-02-07 ENCOUNTER — Other Ambulatory Visit (HOSPITAL_COMMUNITY): Payer: Self-pay

## 2022-02-09 ENCOUNTER — Telehealth (HOSPITAL_COMMUNITY): Payer: Self-pay

## 2022-02-09 NOTE — Telephone Encounter (Signed)
Called and left patient a voice message  to confirm/remind patient of their appointment at the [facility] on 02/10/22.

## 2022-02-10 ENCOUNTER — Other Ambulatory Visit: Payer: Self-pay

## 2022-02-10 ENCOUNTER — Encounter: Payer: Self-pay | Admitting: Hematology

## 2022-02-10 ENCOUNTER — Ambulatory Visit (HOSPITAL_COMMUNITY)
Admission: RE | Admit: 2022-02-10 | Discharge: 2022-02-10 | Disposition: A | Discharge status: Home / Self Care | Payer: Self-pay | Source: Ambulatory Visit | Attending: Family Medicine | Admitting: Family Medicine

## 2022-02-10 ENCOUNTER — Encounter (HOSPITAL_COMMUNITY): Payer: Self-pay

## 2022-02-10 VITALS — BP 132/84 | HR 95 | Wt 131.6 lb

## 2022-02-10 DIAGNOSIS — I5022 Chronic systolic (congestive) heart failure: Secondary | ICD-10-CM | POA: Insufficient documentation

## 2022-02-10 DIAGNOSIS — I340 Nonrheumatic mitral (valve) insufficiency: Secondary | ICD-10-CM

## 2022-02-10 DIAGNOSIS — Z5989 Other problems related to housing and economic circumstances: Secondary | ICD-10-CM

## 2022-02-10 DIAGNOSIS — I428 Other cardiomyopathies: Secondary | ICD-10-CM | POA: Insufficient documentation

## 2022-02-10 DIAGNOSIS — I081 Rheumatic disorders of both mitral and tricuspid valves: Secondary | ICD-10-CM | POA: Insufficient documentation

## 2022-02-10 DIAGNOSIS — Z597 Insufficient social insurance and welfare support: Secondary | ICD-10-CM | POA: Insufficient documentation

## 2022-02-10 DIAGNOSIS — Z79899 Other long term (current) drug therapy: Secondary | ICD-10-CM | POA: Insufficient documentation

## 2022-02-10 LAB — BASIC METABOLIC PANEL
Anion gap: 9 (ref 5–15)
BUN: 12 mg/dL (ref 6–20)
CO2: 23 mmol/L (ref 22–32)
Calcium: 8.7 mg/dL — ABNORMAL LOW (ref 8.9–10.3)
Chloride: 106 mmol/L (ref 98–111)
Creatinine, Ser: 0.58 mg/dL (ref 0.44–1.00)
GFR, Estimated: >60 mL/min (ref >60)
Glucose, Bld: 140 mg/dL — ABNORMAL HIGH (ref 70–99)
Potassium: 3.5 mmol/L (ref 3.5–5.1)
Sodium: 138 mmol/L (ref 135–145)

## 2022-02-10 MED ORDER — SPIRONOLACTONE 25 MG PO TABS
25.0000 mg | ORAL_TABLET | Freq: Every day | ORAL | 11 refills | Status: DC
  Filled 2022-02-10: qty 25, 25d supply, fill #0
  Filled 2022-02-10: qty 5, 5d supply, fill #0

## 2022-02-10 NOTE — Progress Notes (Signed)
Advanced Heart Failure Clinic Note    PCP: Ladell Pier, MD Primary Cardiologist: Dr. Stanford Breed  HF Cardiologist: Dr Aundra Dubin   HPI:  Kathy Rosales is a 46 y.o. female with a past medical history of NICM EF 25-30% (cath in 05/2016 with normal cors), moderate mitral regurgitation, and tricuspid regurgitation. No FH of coronary disease.   Diagnosed with CHF and  went to her PCP in January 2018 for dyspnea. Says she started getting SOB in the fall. Denies any type of viral illness. No family history of heart disease. Echo was performed and showed reduced EF 20-25% with mod-severe MR. at 25-30%. She was referred to Dr. Stanford Breed who then set her up for a left and right heart cath. Admitted over night on 2/19 after cath due to changes in LOC after cath, was hypotensive. Symptoms resolved, head CT negative. Optimization of her medications was prohibited by hypotension prior to discharge. Started on corlanor.   Had CPX 07/15/16. Results as below. Immediately after, pt sat in chair and then had a pseudoseizure. No tonic/clonic activity. Pt taken to ED with Neurology consulted. Pt recovered spontaneously recovered. CT angio chest and CT head unremarkable.  Repeat echo was done in 4/18.  EF remained 20-25% with moderate diastolic dysfunction.   She is s/p [employer] ICD implantation 09/27/16. Admission complicated by AMS thought to be psychogenic. CT head, CTA head/neck and EEG were all negative.  Echo in 2/19 showed EF 35-40%.    Lost to follow up. Last seen 05/2018.   She followed up with EP 3/23 and endorsed new DOE. Echo arranged, showing EF 35-40%, LV function moderately decreased with LV global HK and grade II DD, normal RV. Entresto increased.  Today she returns for HF follow up with interpretor. Overall feeling fine. She continues to walk 3 miles 4 days a week without dyspnea. 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 Denies palpitations, CP,  dizziness, edema, or PND/Orthopnea. Appetite ok. No fever or chills. Weight at home 133 pounds. Taking all medications.  No Lasix use.   Labs (6/18): K 3.5, creatinine 0.71, hgb 13.1 Labs (1/19): digoxin < 0.2 Labs (3/19): K 3.1, creatinine 0.63 Labs (3/23): K 3.6, creatinine 0.64, digoxin 0.4 Labs (4/23): K 3.7, creatinine 0.59, a1c 5.5 Labs (5/23): K 4.0, creatinine 0.63 Labs (7/23): K 3.9, creatinine 0.6  PMH: 1. Chronic systolic CHF: Nonischemic cardiomyopathy.  - LHC/RHC (2/18) with no significant coronary disease. Mean RA 3, mean PCWP 20, CI 2.88.  - Echo (1/18) with EF 20-25%, moderate-severe MR.   - CPX (4/18): peak VO2 14.1, VE/VCO2 slope 59, RER 0.96 (submaximal).  - Echo (4/18): EF 20-25%.  - [place] subcutaneous ICD - Echo (2/19): EF 35-40%, mild MR.  - Echo (3/23): EF 35-40%, LV function moderately decreased with LV global HK and grade II DD, normal RV. 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  3. H/o Still's disease.   SH: Lives with Mom. No alcohol or drug abuse. Does not smoke cigarettes  FH: No history of CAD or cardiomyopathy.     Review of systems complete and found to be negative unless listed in HPI.    Current Outpatient Medications  Medication Sig Dispense Refill   aspirin EC 81 MG tablet Take 1 tablet (81 mg total) by mouth daily. 90 tablet 3   buprenorphine-naloxone (SUBOXONE) 8-2 mg SUBL SL tablet Place 1 tablet under the tongue 2 (two) times daily as needed. 60 tablet  0   butalbital-acetaminophen-caffeine (FIORICET) 50-325-40 MG tablet Take 1 to 2 tablets by mouth twice daily as needed for headache or migraine. 30 tablet 0   carvedilol (COREG) 12.5 MG tablet Take 1 tablet (12.5 mg total) by mouth 2 (two) times daily with a meal. 180 tablet 3   dapagliflozin propanediol (FARXIGA) 10 MG TABS tablet Take 1 tablet (10 mg total) by mouth daily before breakfast. 30 tablet 6   ferrous sulfate 325 (65 FE) MG tablet Take 325 mg by mouth daily with breakfast.      furosemide (LASIX) 20 MG tablet Take 2 tablets (40 mg total) by mouth as needed. 60 tablet 2   methocarbamol (ROBAXIN) 500 MG tablet Take 1 tablet (500 mg total) by mouth 2 (two) times daily as needed for muscle spasms. 20 tablet 0   ondansetron (ZOFRAN ODT) 4 MG disintegrating tablet Take 1 tablet (4 mg total) by mouth every 8 (eight) hours as needed for nausea or vomiting. 20 tablet 0   PARoxetine (PAXIL) 20 MG tablet Take 1 tablet (20 mg total) by mouth daily. 30 tablet 3   Potassium Chloride ER 20 MEQ TBCR Take 20 mEq by mouth daily. (Patient taking differently: Take 20 mEq by mouth daily as needed.) 90 tablet 3   sacubitril-valsartan (ENTRESTO) 49-51 MG Take 1 tablet by mouth 2 (two) times daily. 180 tablet 3   spironolactone (ALDACTONE) 25 MG tablet Take 0.5 tablets (12.5 mg total) by mouth daily. 45 tablet 3   terbinafine (LAMISIL) 250 MG tablet Take 1 tablet (250 mg total) by mouth daily. 90 tablet 0   Topiramate ER (TROKENDI XR) 25 MG CP24 Take 1 capsule (25 mg total) by mouth daily. 30 capsule 1   traZODone (DESYREL) 100 MG tablet Take 1 to 2 tablets at bedtime as needed 60 tablet 3   No current facility-administered medications for this encounter.   BP 132/84   Pulse 95   Wt 59.7 kg (131 lb 9.6 oz)   SpO2 98%   BMI 24.46 kg/m   Wt Readings from Last 3 Encounters:  02/10/22 59.7 kg (131 lb 9.6 oz)  12/02/21 64 kg (141 lb)  11/11/21 65 kg (143 lb 6.4 oz)   Physical Exam: General:  NAD. No resp difficulty HEENT: Normal Neck: Supple. No JVD. Carotids 2+ bilat; no bruits. No lymphadenopathy or thryomegaly appreciated. Cor: PMI nondisplaced. Regular rate & rhythm. No rubs, gallops or murmurs. Lungs: Clear Abdomen: Soft, nontender, nondistended. No hepatosplenomegaly. No bruits or masses. Good bowel sounds. Extremities: No cyanosis, clubbing, rash, edema Neuro: Alert & oriented x 3, cranial nerves grossly intact. Moves all 4 extremities w/o difficulty. Affect  pleasant.  ASSESSMENT & PLAN: 1. Chronic systolic CHF: Nonischemic cardiomyopathy,  LHC 05/2016 no coronary disease.  ?Viral myocarditis as trigger.  [place] subcutaneous ICD.  Echo in 2/19 with EF 35-40%, mild MR. Echo 3/23 with EF stable at 35-40%. NYHA class I symptoms, not volume overloaded.  - Increase spironolactone to 25 mg daily. BMET today, repeat in 1 week. - Continue Entresto 49/51 mg bid.  - Continue Farxiga 10 mg daily. - Continue Coreg 12.5 mg bid.  - Continue Lasix 40 mg daily PRN. - Now off digoxin. 2. Mitral regurgitation:  Mild on Echo 3/23. 3. SDOH: uninsured. HFSW helping with resources.   Follow up in 4 months with Dr. Aundra Dubin.  [place], FNP  02/10/2022

## 2022-02-10 NOTE — Patient Instructions (Signed)
INCREASE Spironolactone to 25 mg one tab daily  Labs today We will only contact you if something comes back abnormal or we need to make some changes. Otherwise no news is good news!  Labs needed in one week  Your physician wants you to follow-up in: 4 months with Dr Aundra Dubin. You will receive a reminder letter in the mail two months in advance. If you don't receive a letter, please call our office to schedule the follow-up appointment.   Do the following things EVERYDAY: Weigh yourself in the morning before breakfast. Write it down and keep it in a log. Take your medicines as prescribed Eat low salt foods--Limit salt (sodium) to 2000 mg per day.  Stay as active as you can everyday Limit all fluids for the day to less than 2 liters   At the [facility], you and your health needs are our priority. As part of our continuing mission to provide you with exceptional heart care, we have created designated Provider Care Teams. These Care Teams include your primary Cardiologist (physician) and Advanced Practice Providers (APPs- Physician Assistants and Nurse Practitioners) who all work together to provide you with the care you need, when you need it.   You may see any of the following providers on your designated Care Team at your next follow up: Dr Glori Bickers Dr Loralie Champagne Dr. Roxana Hires, NP Lyda Jester, [place] [facility] [place], [place] Forestine Na, NP Audry Riles, PharmD   Please be sure to bring in all your medications bottles to every appointment.    If you have any questions or concerns before your next appointment please send [place] a message through [place] or call our office at [phone].    TO LEAVE A MESSAGE FOR THE NURSE SELECT OPTION 2, PLEASE LEAVE A MESSAGE INCLUDING: YOUR NAME DATE OF BIRTH CALL BACK NUMBER REASON FOR CALL**this is important as we prioritize the call backs  YOU WILL RECEIVE A CALL BACK THE SAME DAY  AS LONG AS YOU CALL BEFORE 4:00 PM

## 2022-02-10 NOTE — Addendum Note (Signed)
Encounter addended by: Kerry Dory, CMA on: 02/10/2022 9:21 AM  Actions taken: Clinical Note Signed, Charge Capture section accepted

## 2022-02-16 ENCOUNTER — Other Ambulatory Visit: Payer: Self-pay

## 2022-02-17 ENCOUNTER — Other Ambulatory Visit (HOSPITAL_COMMUNITY): Payer: Self-pay

## 2022-03-03 ENCOUNTER — Other Ambulatory Visit (HOSPITAL_COMMUNITY): Payer: Self-pay

## 2022-03-07 ENCOUNTER — Other Ambulatory Visit (HOSPITAL_COMMUNITY): Payer: Self-pay

## 2022-03-08 ENCOUNTER — Encounter: Payer: Self-pay | Admitting: Hematology

## 2022-03-08 ENCOUNTER — Other Ambulatory Visit (HOSPITAL_COMMUNITY): Payer: Self-pay

## 2022-03-08 MED ORDER — BUPRENORPHINE HCL-NALOXONE HCL 8-2 MG SL SUBL
1.0000 | SUBLINGUAL_TABLET | Freq: Two times a day (BID) | SUBLINGUAL | 0 refills | Status: DC | PRN
  Filled 2022-03-08: qty 60, 30d supply, fill #0

## 2022-03-15 ENCOUNTER — Other Ambulatory Visit: Payer: Self-pay

## 2022-03-15 ENCOUNTER — Encounter: Payer: Self-pay | Admitting: Hematology

## 2022-03-17 ENCOUNTER — Ambulatory Visit (HOSPITAL_COMMUNITY)
Admission: RE | Admit: 2022-03-17 | Discharge: 2022-03-17 | Disposition: A | Discharge status: Home / Self Care | Payer: Self-pay | Source: Ambulatory Visit | Attending: Cardiology | Admitting: Cardiology

## 2022-03-17 DIAGNOSIS — I5022 Chronic systolic (congestive) heart failure: Secondary | ICD-10-CM | POA: Insufficient documentation

## 2022-03-17 LAB — BASIC METABOLIC PANEL
Anion gap: 6 (ref 5–15)
BUN: 11 mg/dL (ref 6–20)
CO2: 26 mmol/L (ref 22–32)
Calcium: 8.3 mg/dL — ABNORMAL LOW (ref 8.9–10.3)
Chloride: 106 mmol/L (ref 98–111)
Creatinine, Ser: 0.57 mg/dL (ref 0.44–1.00)
GFR, Estimated: >60 mL/min (ref >60)
Glucose, Bld: 98 mg/dL (ref 70–99)
Potassium: 3.5 mmol/L (ref 3.5–5.1)
Sodium: 138 mmol/L (ref 135–145)

## 2022-03-20 ENCOUNTER — Ambulatory Visit (INDEPENDENT_AMBULATORY_CARE_PROVIDER_SITE_OTHER): Payer: Self-pay

## 2022-03-20 DIAGNOSIS — I5022 Chronic systolic (congestive) heart failure: Secondary | ICD-10-CM

## 2022-03-20 DIAGNOSIS — I428 Other cardiomyopathies: Secondary | ICD-10-CM

## 2022-03-21 LAB — CUP PACEART REMOTE DEVICE CHECK
Battery Remaining Percentage: 38 %
Date Time Interrogation Session: 20231215180900
Implantable Lead Connection Status: 753985
Implantable Lead Implant Date: 20180627
Implantable Lead Location: 753862
Implantable Lead Model: 3401
Implantable Lead Serial Number: 113145
Implantable Pulse Generator Implant Date: 20180627
Pulse Gen Serial Number: 222625

## 2022-04-11 ENCOUNTER — Other Ambulatory Visit (HOSPITAL_COMMUNITY): Payer: Self-pay

## 2022-04-11 ENCOUNTER — Other Ambulatory Visit (HOSPITAL_COMMUNITY): Payer: Self-pay | Admitting: Psychiatry

## 2022-04-11 ENCOUNTER — Other Ambulatory Visit: Payer: Self-pay

## 2022-04-11 ENCOUNTER — Encounter: Payer: Self-pay | Admitting: Hematology

## 2022-04-11 DIAGNOSIS — F3342 Major depressive disorder, recurrent, in full remission: Secondary | ICD-10-CM

## 2022-04-11 MED ORDER — PAROXETINE HCL 20 MG PO TABS
20.0000 mg | ORAL_TABLET | Freq: Every day | ORAL | 3 refills | Status: DC
  Filled 2022-04-11 – 2022-04-19 (×2): qty 30, 30d supply, fill #0
  Filled 2022-05-16: qty 30, 30d supply, fill #1
  Filled 2022-06-19: qty 30, 30d supply, fill #0
  Filled 2022-07-18: qty 30, 30d supply, fill #1

## 2022-04-11 MED ORDER — BUPRENORPHINE HCL-NALOXONE HCL 8-2 MG SL SUBL
1.0000 | SUBLINGUAL_TABLET | Freq: Two times a day (BID) | SUBLINGUAL | 0 refills | Status: DC | PRN
  Filled 2022-04-18: qty 60, 30d supply, fill #0

## 2022-04-11 MED ORDER — TRAZODONE HCL 100 MG PO TABS
100.0000 mg | ORAL_TABLET | Freq: Every evening | ORAL | 3 refills | Status: DC | PRN
  Filled 2022-04-11 – 2022-04-19 (×2): qty 60, 30d supply, fill #0
  Filled 2022-05-16: qty 60, 30d supply, fill #1
  Filled 2022-06-19: qty 60, 30d supply, fill #0
  Filled 2022-07-18: qty 60, 30d supply, fill #1

## 2022-04-17 ENCOUNTER — Other Ambulatory Visit: Payer: Self-pay

## 2022-04-18 ENCOUNTER — Other Ambulatory Visit (HOSPITAL_COMMUNITY): Payer: Self-pay

## 2022-04-18 ENCOUNTER — Encounter: Payer: Self-pay | Admitting: Hematology

## 2022-04-19 ENCOUNTER — Encounter: Payer: Self-pay | Admitting: Hematology

## 2022-04-19 ENCOUNTER — Other Ambulatory Visit: Payer: Self-pay

## 2022-04-26 NOTE — Progress Notes (Signed)
Remote ICD transmission.   

## 2022-05-03 ENCOUNTER — Ambulatory Visit: Payer: Self-pay | Admitting: Podiatry

## 2022-05-08 ENCOUNTER — Other Ambulatory Visit (HOSPITAL_COMMUNITY): Payer: Self-pay

## 2022-05-08 MED ORDER — BUPRENORPHINE HCL-NALOXONE HCL 8-2 MG SL SUBL
1.0000 | SUBLINGUAL_TABLET | Freq: Two times a day (BID) | SUBLINGUAL | 0 refills | Status: DC | PRN
  Filled 2022-05-10 – 2022-05-16 (×2): qty 60, 30d supply, fill #0

## 2022-05-10 ENCOUNTER — Ambulatory Visit: Payer: Self-pay | Admitting: Podiatry

## 2022-05-10 ENCOUNTER — Other Ambulatory Visit (HOSPITAL_COMMUNITY): Payer: Self-pay

## 2022-05-16 ENCOUNTER — Encounter: Payer: Self-pay | Admitting: Hematology

## 2022-05-16 ENCOUNTER — Other Ambulatory Visit (HOSPITAL_COMMUNITY): Payer: Self-pay

## 2022-05-16 ENCOUNTER — Other Ambulatory Visit: Payer: Self-pay

## 2022-06-19 ENCOUNTER — Other Ambulatory Visit (HOSPITAL_COMMUNITY): Payer: Self-pay

## 2022-06-19 ENCOUNTER — Encounter: Payer: Self-pay | Admitting: Hematology

## 2022-06-20 ENCOUNTER — Encounter: Payer: Self-pay | Admitting: Hematology

## 2022-06-20 ENCOUNTER — Other Ambulatory Visit (HOSPITAL_COMMUNITY): Payer: Self-pay

## 2022-06-20 MED ORDER — BUPRENORPHINE HCL-NALOXONE HCL 8-2 MG SL SUBL
1.0000 | SUBLINGUAL_TABLET | Freq: Two times a day (BID) | SUBLINGUAL | 0 refills | Status: DC
  Filled 2022-06-20: qty 60, 30d supply, fill #0

## 2022-06-21 ENCOUNTER — Other Ambulatory Visit (HOSPITAL_COMMUNITY): Payer: Self-pay

## 2022-06-30 ENCOUNTER — Ambulatory Visit (INDEPENDENT_AMBULATORY_CARE_PROVIDER_SITE_OTHER): Payer: Self-pay

## 2022-06-30 DIAGNOSIS — I428 Other cardiomyopathies: Secondary | ICD-10-CM

## 2022-06-30 LAB — CUP PACEART REMOTE DEVICE CHECK
Battery Remaining Percentage: 35 %
Date Time Interrogation Session: 20240327202100
Implantable Lead Connection Status: 753985
Implantable Lead Implant Date: 20180627
Implantable Lead Location: 753862
Implantable Lead Model: 3401
Implantable Lead Serial Number: 113145
Implantable Pulse Generator Implant Date: 20180627
Pulse Gen Serial Number: 222625

## 2022-07-18 ENCOUNTER — Encounter: Payer: Self-pay | Admitting: Hematology

## 2022-07-18 ENCOUNTER — Other Ambulatory Visit: Payer: Self-pay

## 2022-07-18 ENCOUNTER — Other Ambulatory Visit (HOSPITAL_COMMUNITY): Payer: Self-pay

## 2022-07-19 ENCOUNTER — Encounter: Payer: Self-pay | Admitting: Hematology

## 2022-07-19 ENCOUNTER — Other Ambulatory Visit (HOSPITAL_COMMUNITY): Payer: Self-pay

## 2022-07-19 MED ORDER — BUPRENORPHINE HCL-NALOXONE HCL 8-2 MG SL SUBL
1.0000 | SUBLINGUAL_TABLET | Freq: Two times a day (BID) | SUBLINGUAL | 0 refills | Status: DC | PRN
  Filled 2022-07-19: qty 60, 30d supply, fill #0

## 2022-08-02 NOTE — Progress Notes (Signed)
Remote ICD transmission.   

## 2022-08-16 ENCOUNTER — Other Ambulatory Visit: Payer: Self-pay

## 2022-08-16 ENCOUNTER — Other Ambulatory Visit (HOSPITAL_COMMUNITY): Payer: Self-pay | Admitting: Psychiatry

## 2022-08-16 ENCOUNTER — Encounter: Payer: Self-pay | Admitting: Hematology

## 2022-08-16 ENCOUNTER — Other Ambulatory Visit (HOSPITAL_COMMUNITY): Payer: Self-pay

## 2022-08-16 DIAGNOSIS — F3342 Major depressive disorder, recurrent, in full remission: Secondary | ICD-10-CM

## 2022-08-16 MED ORDER — BUPRENORPHINE HCL-NALOXONE HCL 8-2 MG SL SUBL
1.0000 | SUBLINGUAL_TABLET | Freq: Two times a day (BID) | SUBLINGUAL | 0 refills | Status: DC
  Filled 2022-08-16 – 2022-08-18 (×2): qty 60, 30d supply, fill #0

## 2022-08-18 ENCOUNTER — Other Ambulatory Visit: Payer: Self-pay

## 2022-08-18 ENCOUNTER — Other Ambulatory Visit (HOSPITAL_COMMUNITY): Payer: Self-pay

## 2022-08-18 ENCOUNTER — Encounter: Payer: Self-pay | Admitting: Hematology

## 2022-08-21 ENCOUNTER — Other Ambulatory Visit: Payer: Self-pay

## 2022-08-22 ENCOUNTER — Other Ambulatory Visit (HOSPITAL_COMMUNITY): Payer: Self-pay

## 2022-08-24 ENCOUNTER — Encounter (HOSPITAL_COMMUNITY): Payer: No Payment, Other | Admitting: Psychiatry

## 2022-08-31 ENCOUNTER — Telehealth (INDEPENDENT_AMBULATORY_CARE_PROVIDER_SITE_OTHER): Payer: No Payment, Other | Admitting: Psychiatry

## 2022-08-31 ENCOUNTER — Encounter: Payer: Self-pay | Admitting: Hematology

## 2022-08-31 ENCOUNTER — Encounter (HOSPITAL_COMMUNITY): Payer: Self-pay | Admitting: Psychiatry

## 2022-08-31 ENCOUNTER — Other Ambulatory Visit: Payer: Self-pay

## 2022-08-31 DIAGNOSIS — F3342 Major depressive disorder, recurrent, in full remission: Secondary | ICD-10-CM | POA: Diagnosis not present

## 2022-08-31 MED ORDER — PAROXETINE HCL 20 MG PO TABS
20.0000 mg | ORAL_TABLET | Freq: Every day | ORAL | 3 refills | Status: DC
Start: 2022-08-31 — End: 2023-01-17
  Filled 2022-08-31: qty 30, 30d supply, fill #0
  Filled 2022-10-03: qty 30, 30d supply, fill #1
  Filled 2022-11-15: qty 30, 30d supply, fill #2
  Filled 2022-12-20: qty 30, 30d supply, fill #3

## 2022-08-31 MED ORDER — TRAZODONE HCL 100 MG PO TABS
100.0000 mg | ORAL_TABLET | Freq: Every evening | ORAL | 3 refills | Status: DC | PRN
Start: 2022-08-31 — End: 2023-01-17
  Filled 2022-08-31: qty 60, 30d supply, fill #0
  Filled 2022-10-03: qty 60, 30d supply, fill #1
  Filled 2022-11-15: qty 60, 30d supply, fill #2
  Filled 2022-12-20: qty 60, 30d supply, fill #3

## 2022-08-31 NOTE — Progress Notes (Signed)
BH MD/PA/NP OP Progress Note Virtual Visit via Video Note  I connected with Kathy Rosales on 08/31/22 at  2:30 PM EDT by a video enabled telemedicine application and verified that I am speaking with the correct person using two identifiers.  Location: Patient: Home Provider: Clinic   I discussed the limitations of evaluation and management by telemedicine and the availability of in person appointments. The patient expressed understanding and agreed to proceed.  I provided 30 minutes of non-face-to-face time during this encounter.    08/31/2022 3:01 PM Kathy Rosales  MRN:  [id number]  Chief Complaint: "I am doing well  HPI: 47 year old female seen today for follow up psychiatric evaluation.  She has a psychiatric history of Delirium and Depression. She is currently managed on Paxil 20 mg daily and Trazodone 1-2 100 mg tablets nightly as needed. She notes that her medications are effective in managing her psychiatric conditions.  Today provider utilized a Spanish speaking interpreter as this is patients primary language. Today the patient is pleasant, cooperative, and engaged in conversation. She informed Clinical research associate that there she is doing well. She notes that her mood is stable and reports that she has minimal anxiety and depression. Today provider conducted a GAD7 and patient scored a 3, at her last visit he scored a 4. Provider also conducted a PHQ 9 and patient scored a 3, at his last visit he scored a 5.  She endorses adequate sleep and appetite. Today she denies SI/HI/VAH, mania, or paranoia.  No medication changes made today. Patient agreeable to continue medications as prescribed. No other concerns noted at this time.  Visit Diagnosis:    ICD-10-CM   1. 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[facility])     Past Surgical History:  Procedure Laterality Date   COLONOSCOPY WITH PROPOFOL N/A 07/23/2015   Procedure: COLONOSCOPY WITH PROPOFOL;  Surgeon: Bernette Redbird, MD;  Location: [facility];  Service: Endoscopy;  Laterality: N/A;   ESOPHAGOGASTRODUODENOSCOPY (EGD) WITH PROPOFOL N/A 07/23/2015   Procedure: ESOPHAGOGASTRODUODENOSCOPY (EGD) WITH PROPOFOL;  Surgeon: Bernette Redbird, MD;  Location: [facility];  Service: Endoscopy;  Laterality: N/A;   ESOPHAGOGASTRODUODENOSCOPY (EGD) WITH PROPOFOL N/A 11/18/2015   Procedure: ESOPHAGOGASTRODUODENOSCOPY (EGD) WITH PROPOFOL;  Surgeon: Bernette Redbird, MD;  Location: [facility];  Service: Endoscopy;  Laterality: N/A;   RIGHT/LEFT HEART CATH AND CORONARY ANGIOGRAPHY N/A 05/22/2016   Procedure: Right/Left Heart Cath and Coronary Angiography;  Surgeon: Peter M Swaziland, MD;  Location: [facility] INVASIVE CV LAB;  Service: Cardiovascular;  Laterality: N/A;   SUBQ ICD IMPLANT N/A 09/27/2016   Procedure: SubQ ICD Implant;  Surgeon: Marinus Maw, MD;  Location: [facility] INVASIVE CV LAB;  Service: Cardiovascular;  Laterality: N/A;    Family Psychiatric History: Denies  Family History:  Family History  Problem Relation Age of Onset   Cancer Paternal Grandfather    Mental illness Neg Hx     Social History:  Social History   Socioeconomic History   Marital status: Single    Spouse name: Not on file   Number of children: 1   Years of education: Not on file  Highest education level: Not on file  Occupational History   Occupation: unemployeed  Tobacco Use   Smoking status: Never   Smokeless tobacco: Never  Vaping Use   Vaping Use: Never used  Substance and Sexual Activity   Alcohol use: No   Drug use: No   Sexual activity: Not Currently    Birth control/protection:  None  Other Topics Concern   Not on file  Social History Narrative   ** Merged History Encounter **       ** Merged History Encounter **       Social Determinants of Health   Financial Resource Strain: High Risk (07/08/2021)   Overall Financial Resource Strain (CARDIA)    Difficulty of Paying Living Expenses: Hard  Food Insecurity: Food Insecurity Present (10/12/2020)   Hunger Vital Sign    Worried About Running Out of Food in the Last Year: Sometimes true    Ran Out of Food in the Last Year: Sometimes true  Transportation Needs: No Transportation Needs (10/12/2020)   PRAPARE - Administrator, Civil Service (Medical): No    Lack of Transportation (Non-Medical): No  Physical Activity: Not on file  Stress: Not on file  Social Connections: Not on file    Allergies:  Allergies  Allergen Reactions   Tramadol Itching and Other (See Comments)    Red spots all over body and itching   Furosemide Rash   Leflunomide Rash    Metabolic Disorder Labs: Lab Results  Component Value Date   HGBA1C 5.5 07/08/2021   MPG 111.15 07/08/2021   MPG 120 12/14/2015   No results found for: "PROLACTIN" Lab Results  Component Value Date   CHOL 149 12/14/2015   TRIG 180 (H) 12/14/2015   HDL 40 (L) 12/14/2015   CHOLHDL 3.7 12/14/2015   VLDL 36 12/14/2015   LDLCALC 73 12/14/2015   LDLCALC  09/08/2008    83        Total Cholesterol/HDL:CHD Risk Coronary Heart Disease Risk Table                     Men   Women  1/2 Average Risk   3.4   3.3  Average Risk       5.0   4.4  2 X Average Risk   9.6   7.1  3 X Average Risk  23.4   11.0        Use the calculated Patient Ratio above and the CHD Risk Table to determine the patient's CHD Risk.        ATP III CLASSIFICATION (LDL):  <100     mg/dL   Optimal  161-096  mg/dL   Near or Above                    Optimal  130-159  mg/dL   Borderline  045-409  mg/dL   High  >811     mg/dL   Very High   Lab Results  Component Value Date    TSH 2.530 06/09/2019   TSH 1.670 09/18/2017    Therapeutic Level Labs: No results found for: "LITHIUM" No results found for: "VALPROATE" No results found for: "CBMZ"  Current Medications: Current Outpatient Medications  Medication Sig Dispense Refill   aspirin EC 81 MG tablet Take 1 tablet (81 mg total) by mouth daily. 90 tablet 3   buprenorphine-naloxone (SUBOXONE) 8-2 mg SUBL SL tablet Place 1 tablet under the tongue 2 (two) times daily  as needed. 60 tablet 0   buprenorphine-naloxone (SUBOXONE) 8-2 mg SUBL SL tablet Place 1 tablet under the tongue 2 (two) times daily as needed 60 tablet 0   buprenorphine-naloxone (SUBOXONE) 8-2 mg SUBL SL tablet Place 1 tablet under the tongue 2 (two) times daily as needed. 60 tablet 0   buprenorphine-naloxone (SUBOXONE) 8-2 mg SUBL SL tablet Place 1 tablet under the tongue 2 (two) times daily as needed 60 tablet 0   butalbital-acetaminophen-caffeine (FIORICET) 50-325-40 MG tablet Take 1 to 2 tablets by mouth twice daily as needed for headache or migraine. 30 tablet 0   carvedilol (COREG) 12.5 MG tablet Take 1 tablet (12.5 mg total) by mouth 2 (two) times daily with a meal. 180 tablet 3   dapagliflozin propanediol (FARXIGA) 10 MG TABS tablet Take 1 tablet (10 mg total) by mouth daily before breakfast. 30 tablet 6   ferrous sulfate 325 (65 FE) MG tablet Take 325 mg by mouth daily with breakfast.     furosemide (LASIX) 20 MG tablet Take 2 tablets (40 mg total) by mouth as needed. 60 tablet 2   methocarbamol (ROBAXIN) 500 MG tablet Take 1 tablet (500 mg total) by mouth 2 (two) times daily as needed for muscle spasms. 20 tablet 0   ondansetron (ZOFRAN ODT) 4 MG disintegrating tablet Take 1 tablet (4 mg total) by mouth every 8 (eight) hours as needed for nausea or vomiting. 20 tablet 0   PARoxetine (PAXIL) 20 MG tablet Take 1 tablet (20 mg total) by mouth daily. 30 tablet 3   Potassium Chloride ER 20 MEQ TBCR Take 20 mEq by mouth daily. (Patient taking  differently: Take 20 mEq by mouth daily as needed.) 90 tablet 3   sacubitril-valsartan (ENTRESTO) 49-51 MG Take 1 tablet by mouth 2 (two) times daily. 180 tablet 3   spironolactone (ALDACTONE) 25 MG tablet Take 1 tablet (25 mg total) by mouth daily. 30 tablet 11   terbinafine (LAMISIL) 250 MG tablet Take 1 tablet (250 mg total) by mouth daily. 90 tablet 0   Topiramate ER (TROKENDI XR) 25 MG CP24 Take 1 capsule (25 mg total) by mouth daily. 30 capsule 1   traZODone (DESYREL) 100 MG tablet Take 1-2 tablets (100-200 mg total) by mouth at bedtime as needed. 60 tablet 3   No current facility-administered medications for this visit.     Musculoskeletal: Strength & Muscle Tone: within normal limits and  telehealth visit Gait & Station: normal, telephone visit Patient leans: N/A  Psychiatric Specialty Exam: Review of Systems  There were no vitals taken for this visit.There is no height or weight on file to calculate BMI.  General Appearance: Well Groomed  Eye Contact:  Good  Speech:  Clear and Coherent and Normal Rate  Volume:  Normal  Mood:  Euthymic  Affect:  Appropriate and Congruent  Thought Process:  Coherent, Goal Directed, and Linear  Orientation:  Full (Time, Place, and Person)  Thought Content: WDL and Logical   Suicidal Thoughts:  No  Homicidal Thoughts:  No  Memory:  Immediate;   Good Recent;   Good Remote;   Good  Judgement:  Good  Insight:  Good  Psychomotor Activity:  Normal  Concentration:  Concentration: Good and Attention Span: Good  Recall:  Good  Fund of Knowledge: Good  Language: Good  Akathisia:  No  Handed:  Right  AIMS (if indicated): not done  Assets:  Communication Skills Desire for Improvement Financial Resources/Insurance Housing Physical Health Social Support Talents/Skills  ADL's:  Intact  Cognition: WNL  Sleep:  Good   Screenings: AIMS    Flowsheet Row [facility]   AIMS Total Score 0      AUDIT    Flowsheet Row [facility]  Alcohol Use Disorder Identification Test Final Score (AUDIT) 0      GAD-7    Flowsheet Row Video Visit from 08/31/2022 in [facility] Video Visit from 10/11/2021 in [facility] Video Visit from 03/30/2021 in [facility] Video Visit from 11/19/2020 in [facility] Office Visit from 08/06/2020 in [facility]  Total GAD-7 Score 3 4 0 4 0      PHQ2-9    Flowsheet Row Video Visit from 08/31/2022 in [facility] Video Visit from 10/11/2021 in [facility] Video Visit from 03/30/2021 in [facility] Video Visit from 11/19/2020 in [facility] Office Visit from 08/06/2020 in [facility]  PHQ-2 Total Score 0 3 0 2 0  PHQ-9 Total Score 3 5 0 5 0      Flowsheet Row ED from 06/21/2020 in [facility] Office Visit from 06/04/2020 in [facility]  C-SSRS RISK CATEGORY No Risk No Risk        Assessment and Plan: Patient notes that she is doing well on her current medication regimen. No medication changes made today. Patient agreeable to continue medications as prescribed.   1. Recurrent major depressive disorder, in full remission (HCC)  Continue- traZODone (DESYREL) 100 MG tablet; Take 1 to 2 tablets at bedtime as needed  Dispense: 60 tablet; Refill: 3 Continue- PARoxetine (PAXIL) 20 MG tablet; Take 1 tablet (20 mg total) by mouth daily.  Dispense: 30 tablet; Refill: 3   Follow up in 3 months    Shanna Cisco, NP 08/31/2022, 3:01 PM

## 2022-09-01 ENCOUNTER — Other Ambulatory Visit: Payer: Self-pay

## 2022-09-01 ENCOUNTER — Encounter: Payer: Self-pay | Admitting: Hematology

## 2022-09-13 ENCOUNTER — Encounter: Payer: Self-pay | Admitting: Hematology

## 2022-09-13 ENCOUNTER — Other Ambulatory Visit (HOSPITAL_COMMUNITY): Payer: Self-pay

## 2022-09-13 MED ORDER — BUPRENORPHINE HCL-NALOXONE HCL 8-2 MG SL SUBL
1.0000 | SUBLINGUAL_TABLET | Freq: Two times a day (BID) | SUBLINGUAL | 0 refills | Status: DC | PRN
  Filled 2022-09-13 – 2022-09-18 (×2): qty 60, 30d supply, fill #0

## 2022-09-18 ENCOUNTER — Encounter: Payer: Self-pay | Admitting: Hematology

## 2022-09-18 ENCOUNTER — Other Ambulatory Visit (HOSPITAL_COMMUNITY): Payer: Self-pay

## 2022-09-19 ENCOUNTER — Other Ambulatory Visit (HOSPITAL_COMMUNITY): Payer: Self-pay

## 2022-09-19 ENCOUNTER — Other Ambulatory Visit: Payer: Self-pay

## 2022-10-02 ENCOUNTER — Ambulatory Visit (INDEPENDENT_AMBULATORY_CARE_PROVIDER_SITE_OTHER): Payer: Self-pay

## 2022-10-02 DIAGNOSIS — I428 Other cardiomyopathies: Secondary | ICD-10-CM

## 2022-10-02 DIAGNOSIS — I5022 Chronic systolic (congestive) heart failure: Secondary | ICD-10-CM

## 2022-10-03 ENCOUNTER — Encounter: Payer: Self-pay | Admitting: Hematology

## 2022-10-03 ENCOUNTER — Other Ambulatory Visit: Payer: Self-pay

## 2022-10-03 LAB — CUP PACEART REMOTE DEVICE CHECK
Battery Remaining Percentage: 32 %
Date Time Interrogation Session: 20240628120500
Implantable Lead Connection Status: 753985
Implantable Lead Implant Date: 20180627
Implantable Lead Location: 753862
Implantable Lead Model: 3401
Implantable Lead Serial Number: 113145
Implantable Pulse Generator Implant Date: 20180627
Pulse Gen Serial Number: 222625

## 2022-10-13 ENCOUNTER — Other Ambulatory Visit (HOSPITAL_COMMUNITY): Payer: Self-pay

## 2022-10-16 ENCOUNTER — Encounter: Payer: Self-pay | Admitting: Hematology

## 2022-10-16 ENCOUNTER — Other Ambulatory Visit (HOSPITAL_COMMUNITY): Payer: Self-pay

## 2022-10-16 MED ORDER — BUPRENORPHINE HCL-NALOXONE HCL 8-2 MG SL SUBL
1.0000 | SUBLINGUAL_TABLET | Freq: Two times a day (BID) | SUBLINGUAL | 0 refills | Status: DC | PRN
  Filled 2022-10-16: qty 60, 30d supply, fill #0
  Filled 2022-10-19: qty 56, 28d supply, fill #0
  Filled 2022-10-19: qty 4, 2d supply, fill #0

## 2022-10-19 ENCOUNTER — Other Ambulatory Visit (HOSPITAL_COMMUNITY): Payer: Self-pay

## 2022-10-20 ENCOUNTER — Other Ambulatory Visit (HOSPITAL_COMMUNITY): Payer: Self-pay

## 2022-10-23 ENCOUNTER — Encounter: Payer: Self-pay | Admitting: Hematology

## 2022-10-23 ENCOUNTER — Other Ambulatory Visit (HOSPITAL_COMMUNITY): Payer: Self-pay

## 2022-10-23 NOTE — Progress Notes (Signed)
Remote ICD transmission.   

## 2022-11-15 ENCOUNTER — Encounter: Payer: Self-pay | Admitting: Hematology

## 2022-11-15 ENCOUNTER — Other Ambulatory Visit: Payer: Self-pay

## 2022-11-16 ENCOUNTER — Other Ambulatory Visit: Payer: Self-pay

## 2022-11-21 ENCOUNTER — Encounter: Payer: Self-pay | Admitting: Hematology

## 2022-11-21 ENCOUNTER — Other Ambulatory Visit (HOSPITAL_COMMUNITY): Payer: Self-pay

## 2022-11-21 MED ORDER — BUPRENORPHINE HCL-NALOXONE HCL 8-2 MG SL SUBL
1.0000 | SUBLINGUAL_TABLET | Freq: Two times a day (BID) | SUBLINGUAL | 0 refills | Status: DC | PRN
  Filled 2022-11-21 – 2022-11-22 (×3): qty 60, 30d supply, fill #0

## 2022-11-22 ENCOUNTER — Other Ambulatory Visit (HOSPITAL_COMMUNITY): Payer: Self-pay

## 2022-11-22 ENCOUNTER — Encounter: Payer: Self-pay | Admitting: Hematology

## 2022-11-23 ENCOUNTER — Other Ambulatory Visit (HOSPITAL_COMMUNITY): Payer: Self-pay

## 2022-11-24 ENCOUNTER — Other Ambulatory Visit (HOSPITAL_COMMUNITY): Payer: Self-pay

## 2022-11-29 ENCOUNTER — Telehealth (HOSPITAL_COMMUNITY): Payer: No Payment, Other | Admitting: Psychiatry

## 2022-11-29 ENCOUNTER — Encounter (HOSPITAL_COMMUNITY): Payer: Self-pay

## 2022-12-20 ENCOUNTER — Other Ambulatory Visit: Payer: Self-pay

## 2022-12-20 ENCOUNTER — Encounter: Payer: Self-pay | Admitting: Hematology

## 2022-12-20 ENCOUNTER — Other Ambulatory Visit (HOSPITAL_COMMUNITY): Payer: Self-pay

## 2022-12-21 ENCOUNTER — Other Ambulatory Visit (HOSPITAL_COMMUNITY): Payer: Self-pay

## 2022-12-21 MED ORDER — BUPRENORPHINE HCL-NALOXONE HCL 8-2 MG SL SUBL
1.0000 | SUBLINGUAL_TABLET | Freq: Two times a day (BID) | SUBLINGUAL | 0 refills | Status: DC | PRN
Start: 2022-12-21 — End: 2023-03-05
  Filled 2022-12-21: qty 60, 30d supply, fill #0

## 2022-12-22 ENCOUNTER — Encounter: Payer: Self-pay | Admitting: Hematology

## 2022-12-22 ENCOUNTER — Other Ambulatory Visit (HOSPITAL_COMMUNITY): Payer: Self-pay

## 2022-12-27 ENCOUNTER — Encounter: Payer: Self-pay | Admitting: Hematology

## 2023-01-17 ENCOUNTER — Other Ambulatory Visit (HOSPITAL_COMMUNITY): Payer: Self-pay | Admitting: Psychiatry

## 2023-01-17 ENCOUNTER — Other Ambulatory Visit (HOSPITAL_COMMUNITY): Payer: Self-pay

## 2023-01-17 ENCOUNTER — Other Ambulatory Visit: Payer: Self-pay

## 2023-01-17 DIAGNOSIS — F3342 Major depressive disorder, recurrent, in full remission: Secondary | ICD-10-CM

## 2023-01-17 MED ORDER — BUPRENORPHINE HCL-NALOXONE HCL 8-2 MG SL SUBL
1.0000 | SUBLINGUAL_TABLET | Freq: Two times a day (BID) | SUBLINGUAL | 0 refills | Status: DC
Start: 2023-01-17 — End: 2023-03-29
  Filled 2023-01-25: qty 60, 30d supply, fill #0

## 2023-01-18 ENCOUNTER — Other Ambulatory Visit: Payer: Self-pay

## 2023-01-18 ENCOUNTER — Encounter: Payer: Self-pay | Admitting: Hematology

## 2023-01-18 MED ORDER — TRAZODONE HCL 100 MG PO TABS
100.0000 mg | ORAL_TABLET | Freq: Every evening | ORAL | 3 refills | Status: DC | PRN
  Filled 2023-01-18: qty 60, 30d supply, fill #0
  Filled 2023-02-14 (×2): qty 60, 30d supply, fill #1
  Filled 2023-03-22: qty 60, 30d supply, fill #2
  Filled 2023-04-25: qty 60, 30d supply, fill #0
  Filled 2023-04-25: qty 60, 30d supply, fill #3

## 2023-01-18 MED ORDER — PAROXETINE HCL 20 MG PO TABS
20.0000 mg | ORAL_TABLET | Freq: Every day | ORAL | 3 refills | Status: DC
  Filled 2023-01-18: qty 30, 30d supply, fill #0
  Filled 2023-02-14 (×2): qty 30, 30d supply, fill #1
  Filled 2023-03-22: qty 30, 30d supply, fill #2
  Filled 2023-04-25: qty 30, 30d supply, fill #0
  Filled 2023-04-25: qty 30, 30d supply, fill #3

## 2023-01-25 ENCOUNTER — Encounter: Payer: Self-pay | Admitting: Hematology

## 2023-01-25 ENCOUNTER — Other Ambulatory Visit (HOSPITAL_COMMUNITY): Payer: Self-pay

## 2023-01-25 ENCOUNTER — Other Ambulatory Visit: Payer: Self-pay

## 2023-01-26 ENCOUNTER — Other Ambulatory Visit (HOSPITAL_COMMUNITY): Payer: Self-pay

## 2023-02-14 ENCOUNTER — Other Ambulatory Visit: Payer: Self-pay

## 2023-02-14 ENCOUNTER — Encounter: Payer: Self-pay | Admitting: Hematology

## 2023-02-28 ENCOUNTER — Other Ambulatory Visit (HOSPITAL_COMMUNITY): Payer: Self-pay

## 2023-03-05 ENCOUNTER — Other Ambulatory Visit (HOSPITAL_COMMUNITY): Payer: Self-pay

## 2023-03-05 ENCOUNTER — Encounter: Payer: Self-pay | Admitting: Hematology

## 2023-03-05 MED ORDER — BUPRENORPHINE HCL-NALOXONE HCL 8-2 MG SL SUBL
1.0000 | SUBLINGUAL_TABLET | Freq: Two times a day (BID) | SUBLINGUAL | 0 refills | Status: DC | PRN
Start: 2023-03-05 — End: 2023-04-01
  Filled 2023-03-05: qty 60, 30d supply, fill #0

## 2023-03-06 ENCOUNTER — Other Ambulatory Visit (HOSPITAL_COMMUNITY): Payer: Self-pay

## 2023-03-22 ENCOUNTER — Other Ambulatory Visit: Payer: Self-pay

## 2023-03-22 ENCOUNTER — Encounter: Payer: Self-pay | Admitting: Hematology

## 2023-03-29 ENCOUNTER — Emergency Department (HOSPITAL_COMMUNITY): Payer: Self-pay

## 2023-03-29 ENCOUNTER — Other Ambulatory Visit: Payer: Self-pay

## 2023-03-29 ENCOUNTER — Observation Stay (HOSPITAL_COMMUNITY): Payer: Self-pay

## 2023-03-29 ENCOUNTER — Observation Stay (HOSPITAL_COMMUNITY)
Admission: EM | Admit: 2023-03-29 | Discharge: 2023-03-30 | Disposition: A | Discharge status: Home / Self Care | Payer: Self-pay | Attending: Internal Medicine | Admitting: Internal Medicine

## 2023-03-29 DIAGNOSIS — M5459 Other low back pain: Secondary | ICD-10-CM | POA: Insufficient documentation

## 2023-03-29 DIAGNOSIS — I5023 Acute on chronic systolic (congestive) heart failure: Secondary | ICD-10-CM

## 2023-03-29 DIAGNOSIS — F339 Major depressive disorder, recurrent, unspecified: Secondary | ICD-10-CM | POA: Insufficient documentation

## 2023-03-29 DIAGNOSIS — I5021 Acute systolic (congestive) heart failure: Secondary | ICD-10-CM | POA: Diagnosis present

## 2023-03-29 DIAGNOSIS — Z7982 Long term (current) use of aspirin: Secondary | ICD-10-CM | POA: Insufficient documentation

## 2023-03-29 DIAGNOSIS — D500 Iron deficiency anemia secondary to blood loss (chronic): Secondary | ICD-10-CM | POA: Insufficient documentation

## 2023-03-29 LAB — CBC
HCT: 36.4 % (ref 36.0–46.0)
Hemoglobin: 11.7 g/dL — ABNORMAL LOW (ref 12.0–15.0)
MCH: 29 pg (ref 26.0–34.0)
MCHC: 32.1 g/dL (ref 30.0–36.0)
MCV: 90.3 fL (ref 80.0–100.0)
Platelets: 191 10*3/uL (ref 150–400)
RBC: 4.03 MIL/uL (ref 3.87–5.11)
RDW: 13.2 % (ref 11.5–15.5)
WBC: 3.5 10*3/uL — ABNORMAL LOW (ref 4.0–10.5)
nRBC: 0 % (ref 0.0–0.2)

## 2023-03-29 LAB — BASIC METABOLIC PANEL
Anion gap: 7 (ref 5–15)
BUN: 14 mg/dL (ref 6–20)
CO2: 23 mmol/L (ref 22–32)
Calcium: 8.1 mg/dL — ABNORMAL LOW (ref 8.9–10.3)
Chloride: 105 mmol/L (ref 98–111)
Creatinine, Ser: 0.63 mg/dL (ref 0.44–1.00)
GFR, Estimated: >60 mL/min (ref >60)
Glucose, Bld: 130 mg/dL — ABNORMAL HIGH (ref 70–99)
Potassium: 3.7 mmol/L (ref 3.5–5.1)
Sodium: 135 mmol/L (ref 135–145)

## 2023-03-29 LAB — HEMOGLOBIN A1C
Hgb A1c MFr Bld: 5.1 % (ref 4.8–5.6)
Mean Plasma Glucose: 100 mg/dL

## 2023-03-29 LAB — IRON AND TIBC
Iron: 27 ug/dL — ABNORMAL LOW (ref 28–170)
Saturation Ratios: 6 % — ABNORMAL LOW (ref 10.4–31.8)
TIBC: 434 ug/dL (ref 250–450)
UIBC: 407 ug/dL

## 2023-03-29 LAB — HIV ANTIBODY (ROUTINE TESTING W REFLEX): HIV Screen 4th Generation wRfx: NONREACTIVE

## 2023-03-29 LAB — LIPID PANEL
Cholesterol: 157 mg/dL (ref 0–200)
HDL: 81 mg/dL (ref >40)
LDL Cholesterol: 57 mg/dL (ref 0–99)
Total CHOL/HDL Ratio: 1.9 {ratio}
Triglycerides: 94 mg/dL (ref <150)
VLDL: 19 mg/dL (ref 0–40)

## 2023-03-29 LAB — FERRITIN: Ferritin: 14 ng/mL (ref 11–307)

## 2023-03-29 LAB — TROPONIN I (HIGH SENSITIVITY)
Troponin I (High Sensitivity): 7 ng/L (ref <18)
Troponin I (High Sensitivity): 7 ng/L (ref <18)

## 2023-03-29 LAB — BRAIN NATRIURETIC PEPTIDE: B Natriuretic Peptide: 921.9 pg/mL — ABNORMAL HIGH (ref 0.0–100.0)

## 2023-03-29 LAB — HCG, SERUM, QUALITATIVE: Preg, Serum: NEGATIVE

## 2023-03-29 MED ORDER — PERFLUTREN LIPID MICROSPHERE
1.0000 mL | INTRAVENOUS | Status: AC | PRN
  Administered 2023-03-29: 2 mL via INTRAVENOUS

## 2023-03-29 MED ORDER — POTASSIUM CHLORIDE CRYS ER 20 MEQ PO TBCR
40.0000 meq | EXTENDED_RELEASE_TABLET | Freq: Two times a day (BID) | ORAL | Status: DC
Start: 2023-03-29 — End: 2023-03-30
  Administered 2023-03-29: 40 meq via ORAL
  Filled 2023-03-29: qty 2

## 2023-03-29 MED ORDER — IRON SUCROSE 200 MG IVPB - SIMPLE MED
200.0000 mg | Status: DC
  Filled 2023-03-29: qty 110

## 2023-03-29 MED ORDER — TRAZODONE HCL 100 MG PO TABS: 100.0000 mg | ORAL_TABLET | Freq: Every evening | ORAL | Status: DC | PRN

## 2023-03-29 MED ORDER — IRON SUCROSE 200 MG IVPB - SIMPLE MED: 200.0000 mg | Freq: Once | Status: DC

## 2023-03-29 MED ORDER — CARVEDILOL 3.125 MG PO TABS
3.1250 mg | ORAL_TABLET | Freq: Two times a day (BID) | ORAL | Status: DC
  Administered 2023-03-29 – 2023-03-30 (×2): 3.125 mg via ORAL
  Filled 2023-03-29 (×2): qty 1

## 2023-03-29 MED ORDER — RIVAROXABAN 10 MG PO TABS
10.0000 mg | ORAL_TABLET | Freq: Every day | ORAL | Status: DC
  Administered 2023-03-29 – 2023-03-30 (×2): 10 mg via ORAL
  Filled 2023-03-29 (×2): qty 1

## 2023-03-29 MED ORDER — FUROSEMIDE 10 MG/ML IJ SOLN
20.0000 mg | Freq: Once | INTRAMUSCULAR | Status: AC
  Administered 2023-03-29: 20 mg via INTRAVENOUS
  Filled 2023-03-29: qty 2

## 2023-03-29 MED ORDER — BUPRENORPHINE HCL-NALOXONE HCL 8-2 MG SL SUBL
1.0000 | SUBLINGUAL_TABLET | Freq: Two times a day (BID) | SUBLINGUAL | Status: DC
  Administered 2023-03-29 – 2023-03-30 (×2): 1 via SUBLINGUAL
  Filled 2023-03-29 (×2): qty 1

## 2023-03-29 MED ORDER — PAROXETINE HCL 20 MG PO TABS
20.0000 mg | ORAL_TABLET | Freq: Every day | ORAL | Status: DC
  Administered 2023-03-30: 20 mg via ORAL
  Filled 2023-03-29: qty 1

## 2023-03-29 MED ORDER — SACUBITRIL-VALSARTAN 24-26 MG PO TABS
1.0000 | ORAL_TABLET | Freq: Two times a day (BID) | ORAL | Status: DC
  Administered 2023-03-29 – 2023-03-30 (×2): 1 via ORAL
  Filled 2023-03-29 (×2): qty 1

## 2023-03-29 MED ORDER — IRON SUCROSE 200 MG IVPB - SIMPLE MED: 200.0000 mg | Status: DC

## 2023-03-29 MED ORDER — FUROSEMIDE 10 MG/ML IJ SOLN
20.0000 mg | Freq: Once | INTRAMUSCULAR | Status: AC
Start: 2023-03-29 — End: 2023-03-29
  Administered 2023-03-29: 20 mg via INTRAVENOUS
  Filled 2023-03-29: qty 2

## 2023-03-29 MED ORDER — SODIUM CHLORIDE 0.9 % IV SOLN
200.0000 mg | Freq: Once | INTRAVENOUS | Status: AC
  Administered 2023-03-29: 200 mg via INTRAVENOUS
  Filled 2023-03-29: qty 10

## 2023-03-29 NOTE — H&P (Signed)
Date: 03/29/2023               Patient Name:  Kathy Rosales MRN: [id number]  DOB: 07/29/1975 Age / Sex: 47 y.o., female   PCP: Marcine Matar, MD         Medical Service: Internal Medicine Teaching Service         Attending Physician: Dr. Gust Rung, DO      First Contact: Dr. Faith Rogue, DO Pager [phone]    Second Contact: Dr. Rocky Morel, DO Pager [phone]         After Hours (After 5p/  First Contact Pager: [phone]  weekends / holidays): Second Contact Pager: [phone]   SUBJECTIVE   Chief Complaint: Shortness of breath and chest discomfort  History of Present Illness:  Kathy Rosales is a 47 year old female with a past medical history of nonischemic cardiomyopathy, HFrEF with a LVEF of 35 to 40% in 2023, mild mitral valve regurgitation, MDD, chronic lower back pain on Suboxone presented to emergency department for shortness of breath and chest discomfort that started yesterday evening.  Patient reports chest discomfort and chest pain that started yesterday evening while she was mopping the floor.  She stated that she had similar chest discomfort and shortness of breath about 2 weeks ago however the pain was tolerable then.  She reported that she has not taken her heart failure medications for at least 6 months due to running out of refills.  The last time she saw cardiology was over 1 year ago.  She denied changes in her diet, recent URI, lung disease, or changes in lower extremity edema.  She does report orthopnea and dizziness.   Of note, patient reports changes in vision while reading and with movement, she does not see an eye doctor.  She presented with her daughter who translated per request of patient.  Past Medical History: Nonischemic or myopathy HFrEF LVEF 35 to 40% with ICD in place MDD Mild mitral valve regurgitation Chronic lower back pain on Suboxone  Meds:  Suboxone 8-2 mg SUBL SL Tablet  Trazodone 100 mg  Paxil  20 mg  Patient has not been on GDMT for at least six months  Allergies: Allergies as of 03/29/2023 - Review Complete 03/29/2023  Allergen Reaction Noted   Tramadol Itching and Other (See Comments) 05/22/2016   Furosemide Rash 12/08/2016   Leflunomide Rash 10/22/2014    Past Surgical History:  Procedure Laterality Date   COLONOSCOPY WITH PROPOFOL N/A 07/23/2015   Procedure: COLONOSCOPY WITH PROPOFOL;  Surgeon: Bernette Redbird, MD;  Location: [facility];  Service: Endoscopy;  Laterality: N/A;   ESOPHAGOGASTRODUODENOSCOPY (EGD) WITH PROPOFOL N/A 07/23/2015   Procedure: ESOPHAGOGASTRODUODENOSCOPY (EGD) WITH PROPOFOL;  Surgeon: Bernette Redbird, MD;  [facility]: [facility];  Service: Endoscopy;  Laterality: N/A;   ESOPHAGOGASTRODUODENOSCOPY (EGD) WITH PROPOFOL N/A 11/18/2015   Procedure: ESOPHAGOGASTRODUODENOSCOPY (EGD) WITH PROPOFOL;  Surgeon: Bernette Redbird, MD;  Location: [facility];  Service: Endoscopy;  Laterality: N/A;   RIGHT/LEFT HEART CATH AND CORONARY ANGIOGRAPHY N/A 05/22/2016   Procedure: Right/Left Heart Cath and Coronary Angiography;  Surgeon: Peter M Swaziland, MD;  Location: [facility] INVASIVE CV LAB;  Service: Cardiovascular;  Laterality: N/A;   SUBQ ICD IMPLANT N/A 09/27/2016   Procedure: SubQ ICD Implant;  Surgeon: Marinus Maw, MD;  Location: [facility] INVASIVE CV LAB;  Service: Cardiovascular;  Laterality: N/A;    Social:  Lives With: Daughter at home Support: Daughter  Level of Function: Independent ADLs and IADLs PCP:  Dr. Laural Benes Substances: Denies tobacco, alcohol, and illicit substance use  Family History:  Denied family history of heart disease   OBJECTIVE:   Physical Exam: Blood pressure (!) 126/97, pulse 83, temperature 98 F (36.7 C), resp. rate (!) 21, last menstrual period 03/23/2023, SpO2 95%.  Constitutional: well-appearing , sitting in bed, in no acute distress HENT: normocephalic atraumatic Cardiovascular: regular rate and rhythm, S3, JVP Pulmonary/Chest:  Tachypnea, lungs clear to auscultation bilaterally.  No crackles  Abdominal: soft, non-tender, non-distended.  Neurological: alert & oriented x 3 MSK: no gross abnormalities.  No pitting edema, bilateral upper and lower extremities are warm and dry Skin: warm and dry Psych: Normal mood and affect  Labs:    Latest Ref Rng & Units 03/29/2023   10:45 AM 06/06/2021    4:56 PM 06/21/2020    2:16 PM  CBC  WBC 4.0 - 10.5 K/uL 3.5  4.1  17.9   Hemoglobin 12.0 - 15.0 g/dL 52.8  41.3  24.4   Hematocrit 36.0 - 46.0 % 36.4  36.5  33.3   Platelets 150 - 400 K/uL 191  195  390        Latest Ref Rng & Units 03/29/2023   10:45 AM 03/17/2022    9:00 AM 02/10/2022    9:28 AM  BMP  Glucose 70 - 99 mg/dL 010  98  272   BUN 6 - 20 mg/dL 14  11  12    Creatinine 0.44 - 1.00 mg/dL 5.36  6.44  0.34   Sodium 135 - 145 mmol/L 135  138  138   Potassium 3.5 - 5.1 mmol/L 3.7  3.5  3.5   Chloride 98 - 111 mmol/L 105  106  106   CO2 22 - 32 mmol/L 23  26  23    Calcium 8.9 - 10.3 mg/dL 8.1  8.3  8.7     Troponin: 7, 7 BMP: 921.9  Imaging: DG Chest 2 View Result Date: 03/29/2023 CLINICAL DATA:  Shortness of breath. EXAM: CHEST - 2 VIEW COMPARISON:  Chest radiograph dated September 10, 2018. FINDINGS: The heart is mildly enlarged. Stable subcutaneous ICD along the anterior chest. The mediastinal contours are within normal limits. Streaky right basilar opacities. The left lung appears clear. Suspected trace right pleural effusion. No pneumothorax. Similar compression deformities of T8, T9 and T12. No acute osseous abnormality. IMPRESSION: 1. Mild cardiomegaly. Streaky right basilar opacities could reflect asymmetric edema, infiltrate, or atelectasis. 2. Suspected trace right pleural effusion. Electronically Signed   By: Hart Robinsons M.D.   On: 03/29/2023 11:22      EKG: personally reviewed my interpretation is normal sinus rhythm with T wave inversion in leads II, III, aVF. Prior EKG does not reveal T wave  inversion.  ASSESSMENT & PLAN:   Assessment & Plan by Problem: Principal Problem:   Acute HFrEF (heart failure with reduced ejection fraction) (HCC)   Kathy Rosales is a 47 y.o. person living with a history of nonischemic cardiomyopathy, HFrEF with a LVEF of 35 to 40% in 2023, mild mitral valve regurgitation, MDD, chronic lower back pain on Suboxone presented to emergency department for shortness of breath and chest discomfort and admitted for HFrEF exacerbation on hospital day 0  # HFrEF exacerbation Patient presents emergency department with symptoms congruent of HFrEF exacerbation most likely due to medication noncompliance.  Laboratory findings of an elevated BNP of 921 and physical exam findings of JVP and S3 also supports the findings of fluid overload from  HFrEF exacerbation.  ACS was considered but is low in the differential with in initial troponin of 7 with a repeat troponin of 7. Plan: -Repeat echocardiogram during this hospitalization, prior echocardiogram showed LVEF of 35 to 40% -Strict I's and O's -Lasix 20 mg IV this evening, will reassess volume status in morning  -Daily standing weights -Will begin GDMT, patient will need cardiology heart failure follow-up as well as PCP follow-up         -Entresto 24-26 mg twice daily, Coreg 3.125 mg twice daily  #Chronic back pain on Suboxone Continue home regimen of Suboxone 8-2 mg twice daily  # MDD Home regimen of Paxil 20 mg and trazodone 100 mg nightly   Diet: Heart Healthy VTE: NOAC Code: Full  Prior to Admission Living Arrangement: Home, living with daughter Anticipated Discharge Location: Home Barriers to Discharge: Medical treatment of HFrEF exacerbation  Dispo: Admit patient to Observation with expected length of stay less than 2 midnights.  Signed:   Faith Rogue, DO Internal Medicine Resident PGY-1 03/29/2023, 3:27 PM   Please contact the on call pager after 5 pm and on weekends at  [phone].

## 2023-03-29 NOTE — ED Provider Notes (Signed)
[facility] AT [facility] Provider Note   CSN: [id number] Arrival date & time: 03/29/23  1029     History  Chief Complaint  Patient presents with   Shortness of Breath   Chest Pain   Dizziness    Kathy Rosales is a 47 y.o. female.  HPI Patient has history of nonischemic cardiomyopathy.  In 2018 patient had EF of 25 to 30%.  [employer] ICD last follow-up 11\23 patient had echo showing EF of 35 to 40% with LV function moderately decreased with LV global hypokinesis and grade 2 diastolic dysfunction.  Patient reports that she was doing well until yesterday evening.  She reports she quite abruptly started to get short of breath especially with exertion.  She reports as she exerted herself she felt more short of breath and then pressure on her chest.  Patient denies she is any significant cough.  No fever no chills.  She denies swelling of her legs or pain in her calves.  No personal history of PE or DVT.  Patient denies that she is taking any medications at this time for her heart.  Patient reports that she was not supposed to be continued on heart medications but reviewing the last note from 1123 suggests that plan was to continue on spironolactone, Entresto, Farxiga, Coreg and Lasix.    Home Medications Prior to Admission medications   Medication Sig Start Date End Date Taking? Authorizing Provider  aspirin EC 81 MG tablet Take 1 tablet (81 mg total) by mouth daily. 04/15/19   Marinus Maw, MD  buprenorphine-naloxone (SUBOXONE) 8-2 mg SUBL SL tablet Place 1 tablet under the tongue 2 (two) times daily as needed. 05/06/22     buprenorphine-naloxone (SUBOXONE) 8-2 mg SUBL SL tablet Place 1 tablet under the tongue 2 (two) times daily as needed 06/19/22     buprenorphine-naloxone (SUBOXONE) 8-2 mg SUBL SL tablet Place 1 tablet under the tongue 2 (two) times daily as needed. 07/19/22     buprenorphine-naloxone (SUBOXONE) 8-2 mg SUBL SL  tablet Place 1 tablet under the tongue 2 (two) times daily as needed 08/16/22     buprenorphine-naloxone (SUBOXONE) 8-2 mg SUBL SL tablet Place 1 tablet under the tongue 2 (two) times daily as needed. 01/17/23     buprenorphine-naloxone (SUBOXONE) 8-2 mg SUBL SL tablet Place 1 tablet under the tongue 2 (two) times daily as needed. 03/05/23  Yes   butalbital-acetaminophen-caffeine (FIORICET) 50-325-40 MG tablet Take 1 to 2 tablets by mouth twice daily as needed for headache or migraine. 08/06/20   Marcine Matar, MD  carvedilol (COREG) 12.5 MG tablet Take 1 tablet (12.5 mg total) by mouth 2 (two) times daily with a meal. 04/15/19   Marinus Maw, MD  dapagliflozin propanediol (FARXIGA) 10 MG TABS tablet Take 1 tablet (10 mg total) by mouth daily before breakfast. 07/08/21   Milford, Anderson Malta, FNP  ferrous sulfate 325 (65 FE) MG tablet Take 325 mg by mouth daily with breakfast.    [provider]  furosemide (LASIX) 20 MG tablet Take 2 tablets (40 mg total) by mouth as needed. 08/05/21   Milford, Anderson Malta, FNP  methocarbamol (ROBAXIN) 500 MG tablet Take 1 tablet (500 mg total) by mouth 2 (two) times daily as needed for muscle spasms. 09/18/17   Marcine Matar, MD  ondansetron (ZOFRAN ODT) 4 MG disintegrating tablet Take 1 tablet (4 mg total) by mouth every 8 (eight) hours as needed for nausea  or vomiting. 06/08/20   Lorelee New, PA-C  PARoxetine (PAXIL) 20 MG tablet Take 1 tablet (20 mg total) by mouth daily. 01/18/23  Yes Toy Cookey E, NP  Potassium Chloride ER 20 MEQ TBCR Take 20 mEq by mouth daily. Patient taking differently: Take 20 mEq by mouth daily as needed. 04/15/19   Marinus Maw, MD  sacubitril-valsartan (ENTRESTO) 49-51 MG Take 1 tablet by mouth 2 (two) times daily. 11/01/21   Laurey Morale, MD  spironolactone (ALDACTONE) 25 MG tablet Take 1 tablet (25 mg total) by mouth daily. 02/10/22 02/10/23  Jacklynn Ganong, FNP  terbinafine (LAMISIL) 250 MG tablet Take 1  tablet (250 mg total) by mouth daily. 01/02/22   Candelaria Stagers, DPM  Topiramate ER (TROKENDI XR) 25 MG CP24 Take 1 capsule (25 mg total) by mouth daily. 05/24/21 07/09/22  Claiborne Rigg, NP  traZODone (DESYREL) 100 MG tablet Take 1-2 tablets (100-200 mg total) by mouth at bedtime as needed. 01/18/23  Yes Toy Cookey E, NP  SUMAtriptan (IMITREX) 25 MG tablet Take 2 tablets (50 mg total) by mouth every 2 (two) hours as needed for migraine. May repeat in 2 hours if headache persists or recurs. 06/08/20 06/08/20  Lorelee New, PA-C      Allergies    Tramadol, Furosemide, and Leflunomide    Review of Systems   Review of Systems  Physical Exam Updated Vital Signs BP (!) 126/97   Pulse 83   Temp 98 F (36.7 C)   Resp (!) 21   LMP 03/23/2023   SpO2 95%  Physical Exam Constitutional:      Comments: Alert nontoxic.  Well-nourished well-developed.  No respiratory distress at rest.  HENT:     Mouth/Throat:     Pharynx: Oropharynx is clear.  Eyes:     Extraocular Movements: Extraocular movements intact.  Cardiovascular:     Comments: Regular rate is in the 80s to 90s split S2.  No gross murmur. Pulmonary:     Comments: No respiratory distress at rest.  Patient has crackles bilateral bases. Abdominal:     General: There is no distension.     Palpations: Abdomen is soft.     Tenderness: There is no abdominal tenderness. There is no guarding.  Musculoskeletal:        General: No swelling or tenderness. Normal range of motion.     Right lower leg: No edema.     Left lower leg: No edema.  Skin:    General: Skin is warm and dry.  Neurological:     General: No focal deficit present.     Mental Status: She is oriented to person, place, and time.     Motor: No weakness.     Coordination: Coordination normal.  Psychiatric:        Mood and Affect: Mood normal.     ED Results / Procedures / Treatments   Labs (all labs ordered are listed, but only abnormal results are  displayed) Labs Reviewed  BASIC METABOLIC PANEL - Abnormal; Notable for the following components:      Result Value   Glucose, Bld 130 (*)    Calcium 8.1 (*)    All other components within normal limits  CBC - Abnormal; Notable for the following components:   WBC 3.5 (*)    Hemoglobin 11.7 (*)    All other components within normal limits  BRAIN NATRIURETIC PEPTIDE - Abnormal; Notable for the following components:   B Natriuretic  Peptide 921.9 (*)    All other components within normal limits  HCG, SERUM, QUALITATIVE  TROPONIN I (HIGH SENSITIVITY)  TROPONIN I (HIGH SENSITIVITY)    EKG EKG Interpretation Date/Time:  Thursday March 29 2023 10:09:49 EST Ventricular Rate:  90 PR Interval:  142 QRS Duration:  82 QT Interval:  366 QTC Calculation: 447 R Axis:   60  Text Interpretation: Normal sinus rhythm ST & T wave abnormality, consider inferior ischemia Abnormal ECG When compared with ECG of 28-Oct-2021 09:38, PREVIOUS ECG IS PRESENT agree, no STEMI Confirmed by Arby Barrette [phone]) on 03/29/2023 12:11:52 PM  Radiology DG Chest 2 View Result Date: 03/29/2023 CLINICAL DATA:  Shortness of breath. EXAM: CHEST - 2 VIEW COMPARISON:  Chest radiograph dated September 10, 2018. FINDINGS: The heart is mildly enlarged. Stable subcutaneous ICD along the anterior chest. The mediastinal contours are within normal limits. Streaky right basilar opacities. The left lung appears clear. Suspected trace right pleural effusion. No pneumothorax. Similar compression deformities of T8, T9 and T12. No acute osseous abnormality. IMPRESSION: 1. Mild cardiomegaly. Streaky right basilar opacities could reflect asymmetric edema, infiltrate, or atelectasis. 2. Suspected trace right pleural effusion. Electronically Signed   By: Hart Robinsons M.D.   On: 03/29/2023 11:22    Procedures Procedures    Medications Ordered in ED Medications  furosemide (LASIX) injection 20 mg (20 mg Intravenous Given 03/29/23  1255)    ED Course/ Medical Decision Making/ A&P                                 Medical Decision Making Amount and/or Complexity of Data Reviewed Labs: ordered. Radiology: ordered.  Risk Prescription drug management. Decision regarding hospitalization.   Patient presents outlined.  Known history of congestive heart failure.  Patient describes a fairly abrupt onset of shortness of breath yesterday evening worsening with exertion and some associated chest pressure.  On physical exam, patient does have crackles present.  Oxygen saturation is mid 90s.  At this time suspicion for congestive heart failure.  Differential diagnosis includes ACS\pneumonia\PE.  Chest x-ray reviewed by radiology shows mild vascular congestion and some atelectasis versus streaking.  Will administer Lasix 20 mg based on history, physical exam and chest x-ray.   BMP returns at 921.  Troponin normal.  Basic metabolic panel within normal limits GFR greater than 70.  At this time with known history of cardiomyopathy and acute onset of symptoms consistent with CHF exacerbation, will plan for admission for observation and further diagnostic evaluation.  Consult: Internal medicine teaching service for admission.         Final Clinical Impression(s) / ED Diagnoses Final diagnoses:  Acute on chronic systolic congestive heart failure [facility])    Rx / DC Orders ED Discharge Orders     None         Arby Barrette, MD 03/29/23 1444

## 2023-03-29 NOTE — ED Notes (Signed)
Lab called, will run BNP off previous specimen.

## 2023-03-29 NOTE — Hospital Course (Addendum)
#   HFrEF exacerbation Patient presents emergency department with symptoms congruent of HFrEF exacerbation most likely due to medication noncompliance.  Laboratory findings of an elevated BNP of 921 and physical exam findings of JVP and S3 also supports the findings of fluid overload from HFrEF exacerbation.  ACS was considered but is low in the differential with in initial troponin of 7 with a repeat troponin of 7. Echocardiogram was performed and showed reduced LVEF of 20 to 25% with moderate mitral valve regurgitation. Patient was restarted on GDMT consisting of Entresto 49-51 mg twice daily, Coreg 3.125 mg twice daily, Farxiga 10 mg, spironolactone 12.5 mg daily, and torsemide 20 daily with potassium 20 daily.   #Chronic back pain on Suboxone Continue home regimen of Suboxone 8-2 mg twice daily   # MDD Continued home regimen of Paxil 20 mg and trazodone 100 mg nightly

## 2023-03-29 NOTE — ED Notes (Signed)
ED TO INPATIENT HANDOFF REPORT  ED Nurse Name and Phone #: [phone]  S Name/Age/Gender Kathy Rosales 47 y.o. female Room/Bed: 001C/001C  Code Status   Code Status: Full Code  Home/SNF/Other Home Patient oriented to: self, place, time, and situation Is this baseline? Yes   Triage Complete: Triage complete  Chief Complaint Acute HFrEF (heart failure with reduced ejection fraction) (HCC) [I50.21]  Triage Note Daughter/translator stated, She started having hard to breath with pressure in the heart area with some dizziness. This all started last nigh and it gets worse and worse. Pt. Has a defibrillator. For her to breath better 6-7 years ago.    Allergies Allergies  Allergen Reactions   Tramadol Itching and Other (See Comments)    Red spots all over body and itching   Furosemide Rash   Leflunomide Rash    Level of Care/Admitting Diagnosis ED Disposition     ED Disposition  Admit   Condition  --   Comment  Hospital Area: [facility] [[id number]]  Level of Care: Telemetry Cardiac [[id number]]  May place patient in observation at [facility] or [facility] if equivalent level of care is available:: Yes  Covid Evaluation: Asymptomatic - no recent exposure (last 10 days) testing not required  Diagnosis: Acute HFrEF (heart failure with reduced ejection fraction) [facility]) [[id number]]  Admitting Physician: Silvio Pate  Attending Physician: Gust Rung [[id number]]          B Medical/Surgery History Past Medical History:  Diagnosis Date   Acute systolic CHF (congestive heart failure) (HCC) 05/22/2016   Blood transfusion without reported diagnosis    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HCC)    Past Surgical History:  Procedure Laterality Date   COLONOSCOPY WITH PROPOFOL N/A 07/23/2015   Procedure: COLONOSCOPY WITH PROPOFOL;  Surgeon: Bernette Redbird, MD;  Location: [facility] ENDOSCOPY;  Service: Endoscopy;  Laterality: N/A;   ESOPHAGOGASTRODUODENOSCOPY (EGD) WITH PROPOFOL  N/A 07/23/2015   Procedure: ESOPHAGOGASTRODUODENOSCOPY (EGD) WITH PROPOFOL;  Surgeon: Bernette Redbird, MD;  Location: [facility] ENDOSCOPY;  Service: Endoscopy;  Laterality: N/A;   ESOPHAGOGASTRODUODENOSCOPY (EGD) WITH PROPOFOL N/A 11/18/2015   Procedure: ESOPHAGOGASTRODUODENOSCOPY (EGD) WITH PROPOFOL;  Surgeon: Bernette Redbird, MD;  Location: [facility] ENDOSCOPY;  Service: Endoscopy;  Laterality: N/A;   RIGHT/LEFT HEART CATH AND CORONARY ANGIOGRAPHY N/A 05/22/2016   Procedure: Right/Left Heart Cath and Coronary Angiography;  Surgeon: Peter M Swaziland, MD;  Location: [facility] INVASIVE CV LAB;  Service: Cardiovascular;  Laterality: N/A;   SUBQ ICD IMPLANT N/A 09/27/2016   Procedure: SubQ ICD Implant;  Surgeon: Marinus Maw, MD;  Location: [facility] INVASIVE CV LAB;  Service: Cardiovascular;  Laterality: N/A;     A IV Location/Drains/Wounds Patient Lines/Drains/Airways Status     Active Line/Drains/Airways     Name Placement date Placement time Site Days   Peripheral IV 03/29/23 20 G Anterior;Distal;Left Forearm 03/29/23  1230  Forearm  less than 1   Incision (Closed) 09/27/16 Chest Left;Lateral;Upper 09/27/16  1400  -- 2374   Incision (Closed) 09/27/16 Sternum Medial;Upper 09/27/16  1400  -- 2374   Incision (Closed) 09/27/16 Sternum Medial;Lower 09/27/16  1400  -- 2374            Intake/Output Last 24 hours No intake or output data in the 24 hours ending 03/29/23 1503  Labs/Imaging Results for orders placed or performed during the hospital encounter of 03/29/23 (from the past 48 hours)  Basic metabolic panel     Status: Abnormal   Collection Time: 03/29/23 10:45 AM  Result Value Ref Range   Sodium 135 135 - 145 mmol/L   Potassium 3.7 3.5 - 5.1 mmol/L   Chloride 105 98 - 111 mmol/L   CO2 23 22 - 32 mmol/L   Glucose, Bld 130 (H) 70 - 99 mg/dL    Comment: Glucose reference range applies only to samples taken after fasting for at least 8 hours.   BUN 14 6 - 20 mg/dL   Creatinine, Ser 4.09 0.44 - 1.00 mg/dL    Calcium 8.1 (L) 8.9 - 10.3 mg/dL   GFR, Estimated >81 >19 mL/min    Comment: (NOTE) Calculated using the CKD-EPI Creatinine Equation (2021)    Anion gap 7 5 - 15    Comment: Performed at [facility], [address] [place], [place] [address]  CBC     Status: Abnormal   Collection Time: 03/29/23 10:45 AM  Result Value Ref Range   WBC 3.5 (L) 4.0 - 10.5 K/uL   RBC 4.03 3.87 - 5.11 MIL/uL   Hemoglobin 11.7 (L) 12.0 - 15.0 g/dL   HCT 95.6 21.3 - 08.6 %   MCV 90.3 80.0 - 100.0 fL   MCH 29.0 26.0 - 34.0 pg   MCHC 32.1 30.0 - 36.0 g/dL   RDW 57.8 46.9 - 62.9 %   Platelets 191 150 - 400 K/uL   nRBC 0.0 0.0 - 0.2 %    Comment: Performed at [facility], [address] [place], [place] [address]  Troponin I (High Sensitivity)     Status: None   Collection Time: 03/29/23 10:45 AM  Result Value Ref Range   Troponin I (High Sensitivity) 7 <18 ng/L    Comment: (NOTE) Elevated high sensitivity troponin I (hsTnI) values and significant  changes across serial measurements may suggest ACS but many other  chronic and acute conditions are known to elevate hsTnI results.  Refer to the "Links" section for chest pain algorithms and additional  guidance. Performed at [facility], [address] [place], [place] [address]   hCG, serum, qualitative     Status: None   Collection Time: 03/29/23 10:45 AM  Result Value Ref Range   Preg, Serum NEGATIVE NEGATIVE    Comment:        THE SENSITIVITY OF THIS METHODOLOGY IS >10 mIU/mL. Performed at [facility], [address] [place], [place] [address]   Brain natriuretic peptide     Status: Abnormal   Collection Time: 03/29/23 10:45 AM  Result Value Ref Range   B Natriuretic Peptide 921.9 (H) 0.0 - 100.0 pg/mL    Comment: Performed at [facility], [address] [place], [place] [address]  Troponin I (High Sensitivity)     Status: None   Collection Time: 03/29/23 12:57 PM  Result Value Ref Range   Troponin I  (High Sensitivity) 7 <18 ng/L    Comment: (NOTE) Elevated high sensitivity troponin I (hsTnI) values and significant  changes across serial measurements may suggest ACS but many other  chronic and acute conditions are known to elevate hsTnI results.  Refer to the "Links" section for chest pain algorithms and additional  guidance. Performed at [facility], [address], [place], [place] [address]    DG Chest 2 View Result Date: 03/29/2023 CLINICAL DATA:  Shortness of breath. EXAM: CHEST - 2 VIEW COMPARISON:  Chest radiograph dated September 10, 2018. FINDINGS: The heart is mildly enlarged. Stable subcutaneous ICD along the anterior  chest. The mediastinal contours are within normal limits. Streaky right basilar opacities. The left lung appears clear. Suspected trace right pleural effusion. No pneumothorax. Similar compression deformities of T8, T9 and T12. No acute osseous abnormality. IMPRESSION: 1. Mild cardiomegaly. Streaky right basilar opacities could reflect asymmetric edema, infiltrate, or atelectasis. 2. Suspected trace right pleural effusion. Electronically Signed   By: Hart Robinsons M.D.   On: 03/29/2023 11:22    Pending Labs Unresulted Labs (From admission, onward)     Start     Ordered   03/29/23 1433  HIV Antibody (routine testing w rflx)  (HIV Antibody (Routine testing w reflex) panel)  Once,   R        03/29/23 1445            Vitals/Pain Today's Vitals   03/29/23 1033 03/29/23 1041 03/29/23 1145 03/29/23 1300  BP: (!) 139/96  (!) 130/92 (!) 126/97  Pulse: 95  83 83  Resp: (!) 26  19 (!) 21  Temp: 98 F (36.7 C)     SpO2: 94%  94% 95%  PainSc:  9       Isolation Precautions No active isolations  Medications Medications  buprenorphine-naloxone (SUBOXONE) 8-2 mg per SL tablet 1 tablet (has no administration in time range)  PARoxetine (PAXIL) tablet 20 mg (has no administration in time range)  traZODone (DESYREL) tablet 100 mg (has no administration in  time range)  rivaroxaban (XARELTO) tablet 10 mg (has no administration in time range)  furosemide (LASIX) injection 20 mg (20 mg Intravenous Given 03/29/23 1255)    Mobility walks     Focused Assessments Cardiac Assessment Handoff:    Lab Results  Component Value Date   CKTOTAL 52 09/17/2017   CKMB 1.5 09/08/2008   TROPONINI <0.03 09/10/2018   Lab Results  Component Value Date   DDIMER 2.32 (H) 04/05/2016   Does the Patient currently have chest pain? No    R Recommendations: See Admitting Provider Note  Report given to:   Additional Notes:

## 2023-03-29 NOTE — Progress Notes (Signed)
Echocardiogram 2D Echocardiogram has been performed.  Lucendia Herrlich 03/29/2023, 5:59 PM

## 2023-03-29 NOTE — ED Triage Notes (Addendum)
Daughter/translator stated, She started having hard to breath with pressure in the heart area with some dizziness. This all started last nigh and it gets worse and worse. Pt. Has a defibrillator. For her to breath better 6-7 years ago.

## 2023-03-29 NOTE — Progress Notes (Signed)
Attempted Echocardiogram, patient is moving to a different floor.

## 2023-03-29 NOTE — Plan of Care (Signed)
  Problem: Nutrition: Goal: Adequate nutrition will be maintained Outcome: Completed/Met   Problem: Coping: Goal: Level of anxiety will decrease Outcome: Completed/Met   Problem: Elimination: Goal: Will not experience complications related to urinary retention Outcome: Completed/Met   Problem: Pain Management: Goal: General experience of comfort will improve Outcome: Completed/Met   Problem: Safety: Goal: Ability to remain free from injury will improve Outcome: Completed/Met   Problem: Skin Integrity: Goal: Risk for impaired skin integrity will decrease Outcome: Completed/Met

## 2023-03-30 ENCOUNTER — Other Ambulatory Visit (HOSPITAL_COMMUNITY): Payer: Self-pay

## 2023-03-30 ENCOUNTER — Encounter: Payer: Self-pay | Admitting: Hematology

## 2023-03-30 LAB — CBC
HCT: 39.7 % (ref 36.0–46.0)
Hemoglobin: 13.1 g/dL (ref 12.0–15.0)
MCH: 28.9 pg (ref 26.0–34.0)
MCHC: 33 g/dL (ref 30.0–36.0)
MCV: 87.4 fL (ref 80.0–100.0)
Platelets: 177 10*3/uL (ref 150–400)
RBC: 4.54 MIL/uL (ref 3.87–5.11)
RDW: 13.2 % (ref 11.5–15.5)
WBC: 4.5 10*3/uL (ref 4.0–10.5)
nRBC: 0 % (ref 0.0–0.2)

## 2023-03-30 LAB — ECHOCARDIOGRAM COMPLETE
AR max vel: 1.87 cm2
AV Peak grad: 8.3 mm[Hg]
Ao pk vel: 1.44 m/s
Area-P 1/2: 4.49 cm2
Height: 60 in
MV M vel: 4.34 m/s
MV Peak grad: 75.3 mm[Hg]
S' Lateral: 5.9 cm
Weight: 2049.4 [oz_av]

## 2023-03-30 LAB — BASIC METABOLIC PANEL
Anion gap: 9 (ref 5–15)
BUN: 7 mg/dL (ref 6–20)
CO2: 24 mmol/L (ref 22–32)
Calcium: 8.1 mg/dL — ABNORMAL LOW (ref 8.9–10.3)
Chloride: 103 mmol/L (ref 98–111)
Creatinine, Ser: 0.65 mg/dL (ref 0.44–1.00)
GFR, Estimated: >60 mL/min (ref >60)
Glucose, Bld: 103 mg/dL — ABNORMAL HIGH (ref 70–99)
Potassium: 3.6 mmol/L (ref 3.5–5.1)
Sodium: 136 mmol/L (ref 135–145)

## 2023-03-30 LAB — MAGNESIUM: Magnesium: 1.8 mg/dL (ref 1.7–2.4)

## 2023-03-30 MED ORDER — SPIRONOLACTONE 12.5 MG HALF TABLET
12.5000 mg | ORAL_TABLET | Freq: Every day | ORAL | Status: DC
  Administered 2023-03-30: 12.5 mg via ORAL
  Filled 2023-03-30: qty 1

## 2023-03-30 MED ORDER — MAGNESIUM SULFATE 2 GM/50ML IV SOLN
2.0000 g | Freq: Once | INTRAVENOUS | Status: AC
  Administered 2023-03-30: 2 g via INTRAVENOUS
  Filled 2023-03-30: qty 50

## 2023-03-30 MED ORDER — SPIRONOLACTONE 25 MG PO TABS
12.5000 mg | ORAL_TABLET | Freq: Every day | ORAL | 0 refills | Status: DC
  Filled 2023-03-30: qty 15, 30d supply, fill #0

## 2023-03-30 MED ORDER — ENTRESTO 49-51 MG PO TABS
1.0000 | ORAL_TABLET | Freq: Two times a day (BID) | ORAL | 0 refills | Status: DC
  Filled 2023-03-30 (×2): qty 60, 30d supply, fill #0

## 2023-03-30 MED ORDER — TORSEMIDE 20 MG PO TABS
20.0000 mg | ORAL_TABLET | Freq: Once | ORAL | Status: AC
  Administered 2023-03-30: 20 mg via ORAL
  Filled 2023-03-30: qty 1

## 2023-03-30 MED ORDER — POTASSIUM CHLORIDE CRYS ER 20 MEQ PO TBCR
40.0000 meq | EXTENDED_RELEASE_TABLET | Freq: Once | ORAL | Status: AC
  Administered 2023-03-30: 40 meq via ORAL
  Filled 2023-03-30: qty 2

## 2023-03-30 MED ORDER — TORSEMIDE 20 MG PO TABS
20.0000 mg | ORAL_TABLET | Freq: Every day | ORAL | 0 refills | Status: DC
  Filled 2023-03-30: qty 30, 30d supply, fill #0

## 2023-03-30 MED ORDER — POTASSIUM CHLORIDE CRYS ER 20 MEQ PO TBCR
20.0000 meq | EXTENDED_RELEASE_TABLET | Freq: Every day | ORAL | 0 refills | Status: DC
  Filled 2023-03-30: qty 30, 30d supply, fill #0

## 2023-03-30 MED ORDER — DAPAGLIFLOZIN PROPANEDIOL 10 MG PO TABS
10.0000 mg | ORAL_TABLET | Freq: Every day | ORAL | 0 refills | Status: DC
  Filled 2023-03-30: qty 30, 30d supply, fill #0

## 2023-03-30 MED ORDER — CARVEDILOL 3.125 MG PO TABS
3.1250 mg | ORAL_TABLET | Freq: Two times a day (BID) | ORAL | 0 refills | Status: DC
  Filled 2023-03-30: qty 60, 30d supply, fill #0

## 2023-03-30 MED ORDER — DAPAGLIFLOZIN PROPANEDIOL 10 MG PO TABS
10.0000 mg | ORAL_TABLET | Freq: Every day | ORAL | Status: DC
Start: 2023-03-30 — End: 2023-03-30
  Administered 2023-03-30: 10 mg via ORAL
  Filled 2023-03-30: qty 1

## 2023-03-30 NOTE — Progress Notes (Signed)
Heart Failure Navigator Progress Note  Assessed for Heart & Vascular TOC clinic readiness.  Patient does not meet criteria due to Advanced Heart Failure Team patient of Dr. McLean.   Navigator will sign off at this time.   Mahreen Schewe, BSN, RN Heart Failure Nurse Navigator Secure Chat Only   

## 2023-03-30 NOTE — Progress Notes (Signed)
Kathy Rosales Kathy Rosales to be D/C'd Home per MD order.  Discussed with the patient and all questions fully answered.  VSS, Skin clean, dry and intact without evidence of skin break down, no evidence of skin tears noted. IV catheter discontinued intact. Site without signs and symptoms of complications. Dressing and pressure applied.  An After Visit Summary was printed and given to the patient. Patient awaiting to receive prescriptions from [facility] pharmacy. TOC medications not quite ready to be picked up. Spoke to Orvan Seen, RN in discharge lounge to make sure patient receives TOC medications prior to discharge.  D/c education completed with patient/family including follow up instructions, medication list, d/c activities limitations if indicated, with other d/c instructions as indicated by MD - patient able to verbalize understanding, all questions fully answered.   Patient instructed to return to ED, call 911, or call MD for any changes in condition.   Patient to discharge lounge for pick up.  [name] 03/30/2023 11:18 AM

## 2023-03-30 NOTE — Discharge Instructions (Signed)
You came to the hospital for chest discomfort and shortness of breath and you were diagnosed with exacerbation of heart failure.  We treated you with IV diuretics.   *For your heart failure -We have started you on these following medications:  -Entresto 49-51 mg, take this medication every morning and evening (first dose will be this evening)  -Coreg 3.125 mg, take this medication every morning and every evening (please take a dose this evening)  -Farxiga 10  mg daily, start taking this medication tomorrow  -Spironolactone 12.5 mg daily, start taking medication tomorrow             -Torsemide 20 mg, take this medication daily, please take this medication tomorrow             -Potassium , take this medication daily, starting this medication tomorrow -Please see PCP in 7 to 10 days -Please follow-up with your cardiologist on 04/09/2023 at 12:00   Follow-up appointments: Please visit your family doctor in 7 to 10 days Please visit cardiologist (heart doctor) on 04/09/2023 at 12:00   If you have any questions or concerns please feel free to call: Internal medicine clinic at [phone]   If you have any of these following symptoms, please call us or seek care at an emergency department: -Chest Pain -Difficulty Breathing -Syncope (passing out) -Drooping of face -Slurred speech -Sudden weakness in your leg or arm -Fever -Chills   We are glad that you are feeling better, it was a pleasure to care for you!  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 Le tratamos con diurticos intravenosos.  *Para su insuficiencia cardaca -Hemos comenzado a administrarle los siguientes medicamentos: -Entresto 49-51 mg, tome este medicamento todas las maanas y todas las noches (la primera dosis ser esta noche) -Coreg 3,125 mg,  tome este medicamento todas las [place] y todas las noches (tome una dosis esta noche) -Farxiga 10 mg al Futures trader, comience a tomar este medicamento maana -Espironolactona 12,5 mg al [employer], comience a tomar el medicamento maana -Torsemida 20 mg, tome este [employer], [employer]melo [employer] -Potasio 20 meq, tome este [employer], comience a tomar este medicamento maana -Visite a su mdico de Information systems manager en 7 a 10 das -Realice una cita de seguimiento con su cardilogo el 09/02/2023 a las 12:00  Citas de seguimiento: Visite a su mdico de cabecera en 7 a 10 das Visite a su cardilogo (mdico del corazn) el 09/02/2023 a las 12:00  Si tiene alguna pregunta o inquietud, no dude L[employer]ist a la [place] [facility] al [phone]  Si tiene alguno de los siguientes sntomas, llmenos o busque atencin en un departamento de emergencias: -Dolor en el pecho -Dificultad para respirar -Sncope (desmayo) -Cara cada -Habla arrastrada -Debilidad repentina en la pierna o el brazo -Grant Ruts -Escalofros  Nos alegra que se sienta mejor, fue un placer atenderlo!  Faith Rogue DO

## 2023-03-30 NOTE — Discharge Summary (Signed)
Name: Kathy Rosales MRN: [id number] DOB: 05-02-1975 47 y.o. PCP: Marcine Matar, MD  Date of Admission: 03/29/2023 10:30 AM Date of Discharge: 03/30/2023 10:56 AM Attending Physician: Dr. Cleda Daub  Discharge Diagnosis: Principal Problem:   Acute HFrEF (heart failure with reduced ejection fraction) (HCC)    Discharge Medications: Allergies as of 03/30/2023       Reactions   Tramadol Itching, Other (See Comments)   Red spots all over body and itching   Furosemide Rash   Leflunomide Rash        Medication List     STOP taking these medications    aspirin EC 81 MG tablet       TAKE these medications    buprenorphine-naloxone 8-2 mg Subl SL tablet Commonly known as: SUBOXONE Place 1 tablet under the tongue 2 (two) times daily as needed.   carvedilol 3.125 MG tablet Commonly known as: COREG Tome 1 comprimido (3,125 mg en total) por va oral 2 (dos) veces al da con una comida. (Take 1 tablet (3.125 mg total) by mouth 2 (two) times daily with a meal.)   dapagliflozin propanediol 10 MG Tabs tablet Commonly known as: FARXIGA Tome 1 tableta (10 mg en total) por va oral diariamente. (Take 1 tablet (10 mg total) by mouth daily.) Start taking on: March 31, 2023   Entresto 49-51 MG Generic drug: sacubitril-valsartan Take 1 tablet by mouth 2 (two) times daily.   PARoxetine 20 MG tablet Commonly known as: Paxil Tome 1 tableta (20 mg en total) por va oral diariamente. (Take 1 tablet (20 mg total) by mouth daily.)   potassium chloride SA 20 MEQ tablet Commonly known as: KLOR-CON M Tome una tableta (20 mEq en total) por va oral diariamente. (Take 1 tablet (20 mEq total) by mouth daily.)   spironolactone 25 MG tablet Commonly known as: ALDACTONE Tome 0,5 comprimidos (12,5 mg en total) por va oral diariamente. (Take 0.5 tablets (12.5 mg total) by mouth daily.) Start taking on: March 31, 2023   torsemide 20 MG tablet Commonly  known as: DEMADEX Tome 1 tableta (20 mg en total) por va oral diariamente. (Take 1 tablet (20 mg total) by mouth daily.)   traZODone 100 MG tablet Commonly known as: DESYREL Take 1-2 tablets (100-200 mg total) by mouth at bedtime as needed.        Disposition and follow-up:   [name] was discharged from [facility] in Good condition.  At the hospital follow up visit please address:  1.  Follow-up:  *HFrEF LVEF 20-25% -Please ensure that patient follows up with cardiologist -Please note the GDMT medication regimen below -Encourage patient to continue GDMT, encourage patient to call if she needs refills -Please repeat BMP -Follow-up with patient's application for insurance   *Iron deficiency, no anemia  -Note that patient received IV Venofer 200 mg -Repeat iron panel and supplement if indicated -Refer to the heart failure team for any additional iron replacement   2.  Labs / imaging needed at time of follow-up: BMP, iron panel in future  3.  Pending labs/ test needing follow-up: N/A  4.  Medication Changes  STOPPED  -ASA 81 mg (patient was not taking)    ADDED  -Entresto 49-51 mg twice daily -Coreg 3.125 mg twice daily -Farxiga 10 mg, spironolactone 12.5 mg daily -Torsemide 20 daily  -Potassium 20 daily.     MODIFIED  -N/A     Follow-up Appointments:  Follow-up Information  [facility] Follow up.   Specialty: Cardiology Why: Monday 04/09/2023 at 12:00 Contact information: [address] [place] [address] [phone]        Marcine Matar, MD Follow up in 1 week(s).   Specialty: Internal Medicine Contact information: [address] [place] [address] [phone]                 Hospital Course by problem list: # HFrEF exacerbation Patient presents emergency department with symptoms congruent of HFrEF  exacerbation most likely due to medication noncompliance.  Laboratory findings of an elevated BNP of 921 and physical exam findings of JVP and S3 also supports the findings of fluid overload from HFrEF exacerbation.  ACS was considered but is low in the differential with in initial troponin of 7 with a repeat troponin of 7. Echocardiogram was performed and showed reduced LVEF of 20 to 25% with moderate mitral valve regurgitation. Patient was restarted on GDMT consisting of Entresto 49-51 mg twice daily, Coreg 3.125 mg twice daily, Farxiga 10 mg, spironolactone 12.5 mg daily, and torsemide 20 daily with potassium 20 daily.   #Chronic back pain on Suboxone Continue home regimen of Suboxone 8-2 mg twice daily   # MDD Continued home regimen of Paxil 20 mg and trazodone 100 mg nightly      Discharge Subjective: Patient reported feeling well this morning.  Her orthopnea has improved and she is no longer short of breath.  Patient was explained the importance of taking medications and following up with cardiology and PCP.  Patient verbalized her understanding. Daughter at bedside.  Discharge Exam:   Blood pressure 120/88, pulse 79, temperature 97.9 F (36.6 C), temperature source Oral, resp. rate 18, height 5' (1.524 m), weight 57.3 kg, last menstrual period 03/23/2023, SpO2 93%.  Constitutional:Well-appearing,  in no acute distress Cardiovascular: regular rate and rhythm, no S3 auscultated, JVP is appreciated Pulmonary/Chest: normal work of breathing on room air, lungs clear to auscultation bilaterally. No crackles  Neurological: alert & oriented x 3 MSK: no gross abnormalities. No pitting edema Skin: warm and dry Psych: Normal mood and affect  Pertinent Labs, Studies, and Procedures:     Latest Ref Rng & Units 03/30/2023    3:04 AM 03/29/2023   10:45 AM 06/06/2021    4:56 PM  CBC  WBC 4.0 - 10.5 K/uL 4.5  3.5  4.1   Hemoglobin 12.0 - 15.0 g/dL 57.8  46.9  62.9   Hematocrit 36.0 - 46.0 %  39.7  36.4  36.5   Platelets 150 - 400 K/uL 177  191  195        Latest Ref Rng & Units 03/30/2023    3:04 AM 03/29/2023   10:45 AM 03/17/2022    9:00 AM  CMP  Glucose 70 - 99 mg/dL 528  413  98   BUN 6 - 20 mg/dL 7  14  11    Creatinine 0.44 - 1.00 mg/dL 2.44  0.10  2.72   Sodium 135 - 145 mmol/L 136  135  138   Potassium 3.5 - 5.1 mmol/L 3.6  3.7  3.5   Chloride 98 - 111 mmol/L 103  105  106   CO2 22 - 32 mmol/L 24  23  26    Calcium 8.9 - 10.3 mg/dL 8.1  8.1  8.3     ECHOCARDIOGRAM COMPLETE Result Date: 03/30/2023    ECHOCARDIOGRAM REPORT   Patient Name:  Kathy Rosales Date of Exam: 03/29/2023 Medical Rec #:  [id number]                     Height:       61.5 in Accession #:    [id number]                    Weight:       131.6 lb Date of Birth:  [birth year]                    BSA:          1.591 m Patient Age:    46 years                      BP:           16/97 mmHg Patient Gender: F                             HR:           79 bpm. Exam Location:  Inpatient Procedure: 2D Echo, Cardiac Doppler, Color Doppler and Intracardiac            Opacification Agent Indications:    CHF I50.9  History:        Patient has prior history of Echocardiogram examinations, most                 recent 06/17/2021. CHF, Defibrillator, Migraine;                 Signs/Symptoms:Syncope.  Sonographer:    Lucendia Herrlich RCS Referring Phys: [id number] MARCY PFEIFFER IMPRESSIONS  1. Left ventricular ejection fraction, by estimation, is 20 to 25%. The left ventricle has severely decreased function. The left ventricle demonstrates global hypokinesis. The left ventricular internal cavity size was moderately dilated. Left ventricular diastolic parameters are indeterminate.  2. Right ventricular systolic function is mildly reduced. The right ventricular size is normal. There is normal pulmonary artery systolic pressure. The estimated right ventricular systolic pressure is 20.3 mmHg.  3. Left atrial size was  moderately dilated.  4. The mitral valve is normal in structure. Moderate mitral valve regurgitation. No evidence of mitral stenosis.  5. The aortic valve is tricuspid. Aortic valve regurgitation is not visualized. No aortic stenosis is present.  6. The inferior vena cava is normal in size with greater than 50% respiratory variability, suggesting right atrial pressure of 3 mmHg. Comparison(s): Prior images reviewed side by side. The left ventricular function is worsened. FINDINGS  Left Ventricle: Left ventricular ejection fraction, by estimation, is 20 to 25%. The left ventricle has severely decreased function. The left ventricle demonstrates global hypokinesis. Definity contrast agent was given IV to delineate the left ventricular endocardial borders. The left ventricular internal cavity size was moderately dilated. There is no left ventricular hypertrophy. Left ventricular diastolic parameters are indeterminate. Right Ventricle: The right ventricular size is normal. No increase in right ventricular wall thickness. Right ventricular systolic function is mildly reduced. There is normal pulmonary artery systolic pressure. The tricuspid regurgitant velocity is 2.08 m/s, and with an assumed right atrial pressure of 3 mmHg, the estimated right ventricular systolic pressure is 20.3 mmHg. Left Atrium: Left atrial size was moderately dilated. Right Atrium: Right atrial size was normal in size. Pericardium: There is no evidence of pericardial effusion. Mitral Valve: The mitral valve is normal in structure.  Moderate mitral valve regurgitation. No evidence of mitral valve stenosis. Tricuspid Valve: The tricuspid valve is normal in structure. Tricuspid valve regurgitation is mild . No evidence of tricuspid stenosis. Aortic Valve: The aortic valve is tricuspid. Aortic valve regurgitation is not visualized. No aortic stenosis is present. Aortic valve peak gradient measures 8.3 mmHg. Pulmonic Valve: The pulmonic valve was normal  in structure. Pulmonic valve regurgitation is not visualized. No evidence of pulmonic stenosis. Aorta: The aortic root is normal in size and structure. Venous: The inferior vena cava is normal in size with greater than 50% respiratory variability, suggesting right atrial pressure of 3 mmHg. IAS/Shunts: No atrial level shunt detected by color flow Doppler. Additional Comments: A device lead is visualized in the right ventricle.  LEFT VENTRICLE PLAX 2D LVIDd:         6.50 cm   Diastology LVIDs:         5.90 cm   LV e' medial:    6.85 cm/s LV PW:         0.70 cm   LV E/e' medial:  13.6 LV IVS:        0.40 cm   LV e' lateral:   11.50 cm/s LVOT diam:     2.00 cm   LV E/e' lateral: 8.1 LV SV:         44 LV SV Index:   28 LVOT Area:     3.14 cm  RIGHT VENTRICLE            IVC RV S prime:     8.59 cm/s  IVC diam: 1.60 cm TAPSE (M-mode): 1.6 cm LEFT ATRIUM             Index        RIGHT ATRIUM           Index LA diam:        4.30 cm 2.70 cm/m   RA Area:     10.20 cm LA Vol (A2C):   61.4 ml 38.60 ml/m  RA Volume:   22.90 ml  14.40 ml/m LA Vol (A4C):   75.4 ml 47.40 ml/m LA Biplane Vol: 72.2 ml 45.39 ml/m  AORTIC VALVE AV Area (Vmax): 1.87 cm AV Vmax:        144.00 cm/s AV Peak Grad:   8.3 mmHg LVOT Vmax:      85.60 cm/s LVOT Vmean:     54.333 cm/s LVOT VTI:       0.141 m  AORTA Ao Root diam: 3.00 cm Ao Asc diam:  2.80 cm MITRAL VALVE               TRICUSPID VALVE MV Area (PHT): 4.49 cm    TR Peak grad:   17.3 mmHg MV Decel Time: 169 msec    TR Vmax:        208.00 cm/s MR Peak grad: 75.3 mmHg MR Vmax:      434.00 cm/s  SHUNTS MV E velocity: 93.20 cm/s  Systemic VTI:  0.14 m MV A velocity: 53.40 cm/s  Systemic Diam: 2.00 cm MV E/A ratio:  1.75 Donato Schultz MD Electronically signed by Donato Schultz MD Signature Date/Time: 03/30/2023/6:50:41 AM    Final    DG Chest 2 View Result Date: 03/29/2023 CLINICAL DATA:  Shortness of breath. EXAM: CHEST - 2 VIEW COMPARISON:  Chest radiograph dated September 10, 2018. FINDINGS: The  heart is mildly enlarged. Stable subcutaneous ICD along the anterior chest. The mediastinal contours are within normal limits.  Streaky right basilar opacities. The left lung appears clear. Suspected trace right pleural effusion. No pneumothorax. Similar compression deformities of T8, T9 and T12. No acute osseous abnormality. IMPRESSION: 1. Mild cardiomegaly. Streaky right basilar opacities could reflect asymmetric edema, infiltrate, or atelectasis. 2. Suspected trace right pleural effusion. Electronically Signed   By: Hart Robinsons M.D.   On: 03/29/2023 11:22     Discharge Instructions: Discharge Instructions     (HEART FAILURE PATIENTS) Call MD:  Anytime you have any of the following symptoms: 1) 3 pound weight gain in 24 hours or 5 pounds in 1 week 2) shortness of breath, with or without a dry hacking cough 3) swelling in the hands, feet or stomach 4) if you have to sleep on extra pillows at night in order to breathe.   Complete by: As directed    Call MD for:  difficulty breathing, headache or visual disturbances   Complete by: As directed    Call MD for:  extreme fatigue   Complete by: As directed    Call MD for:  hives   Complete by: As directed    Call MD for:  persistant dizziness or light-headedness   Complete by: As directed    Call MD for:  persistant nausea and vomiting   Complete by: As directed    Call MD for:  redness, tenderness, or signs of infection (pain, swelling, redness, odor or green/yellow discharge around incision site)   Complete by: As directed    Call MD for:  severe uncontrolled pain   Complete by: As directed    Call MD for:  temperature >100.4   Complete by: As directed    Diet - low sodium heart healthy   Complete by: As directed    Discharge instructions   Complete by: As directed    You came to the hospital for chest discomfort and shortness of breath and you were diagnosed with exacerbation of heart failure.  We treated you with IV diuretics.   *For  your heart failure -We have started you on these following medications:  -Entresto 49-51 mg, take this medication every morning and evening (first dose will be this evening)  -Coreg 3.125 mg, take this medication every morning and every evening (please take a dose this evening)  -Farxiga 10  mg daily, start taking this medication tomorrow  -Spironolactone 12.5 mg daily, start taking medication tomorrow             -Torsemide 20 mg, take this medication daily, please take this medication tomorrow             -Potassium , take this medication daily, starting this medication tomorrow -Please see PCP in 7 to 10 days -Please follow-up with your cardiologist on 04/09/2023 at 12:00   Follow-up appointments: Please visit your family doctor in 7 to 10 days Please visit cardiologist (heart doctor) on 04/09/2023 at 12:00   If you have any questions or concerns please feel free to call: Internal medicine clinic at [phone]   If you have any of these following symptoms, please call us or seek care at an emergency department: -Chest Pain -Difficulty Breathing -Syncope (passing out) -Drooping of face -Slurred speech -Sudden weakness in your leg or arm -Fever -Chills   We are glad that you are feeling better, it was a pleasure to care for you!  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 Le tratamos con diurticos intravenosos.  *Para su insuficiencia cardaca -Hemos comenzado a administrarle los siguientes medicamentos: -Entresto 49-51 mg, tome este medicamento todas las maanas y todas las noches (la primera dosis ser esta noche) -Coreg 3,125 mg, tome este medicamento todas las [place] y todas las noches (tome una dosis esta noche) -Farxiga 10 mg al Futures trader, comience a tomar este medicamento maana -Espironolactona 12,5  mg al [employer], comience a tomar el medicamento maana -Torsemida 20 mg, tome este [employer], [employer]melo [employer] -Potasio 20 meq, tome este [employer], comience a tomar este medicamento maana -Visite a su mdico de Information systems manager en 7 a 10 das -Realice una cita de seguimiento con su cardilogo el 09/02/2023 a las 12:00  Citas de seguimiento: Visite a su mdico de cabecera en 7 a 10 das Visite a su cardilogo (mdico del corazn) el 09/02/2023 a las 12:00  Si tiene alguna pregunta o inquietud, no dude Lobbyist a la [place] [facility] [employer] al [phone]  Si tiene alguno de los siguientes sntomas, llmenos o busque atencin en un departamento de emergencias: -Dolor en el pecho -Dificultad para respirar -Sncope (desmayo) -Cara cada -Habla arrastrada -Debilidad repentina en la pierna o el brazo -Grant Ruts -Escalofros  Nos alegra que se sienta mejor, fue un placer atenderlo!  Faith Rogue DO   Increase activity slowly   Complete by: As directed        Signed: Faith Rogue DO [facility] - PGY1 Pager: [phone] 03/30/2023, 10:56 AM    Please contact the on call pager after 5 pm and on weekends at [phone].

## 2023-03-30 NOTE — TOC Transition Note (Signed)
Transition of Care [facility]) - Discharge Note   Patient Details  Name: Kathy Rosales MRN: [id number] Date of Birth: [birth year]  [facility] CM/SW Contact:  Kermit Balo, RN Phone Number: 03/30/2023, 11:50 AM   Clinical Narrative:     Pt discharging home with self care. CM provided MATCH to assist with the cost of d/c medications. She is active with [facility] and denies issues with medications outside the hospital setting. She has transportation home and her sister can provide supervision.  Final next level of care: Home/Self Care Barriers to Discharge: Inadequate or no insurance, Barriers Unresolved (comment)   Patient Goals and CMS Choice            Discharge Placement                       Discharge Plan and Services Additional resources added to the After Visit Summary for                                       Social Drivers of Health (SDOH) Interventions SDOH Screenings   Food Insecurity: No Food Insecurity (03/30/2023)  Housing: Unknown (03/30/2023)  Transportation Needs: No Transportation Needs (03/30/2023)  Utilities: Not At Risk (03/30/2023)  Depression (PHQ2-9): Low Risk  (08/31/2022)  Financial Resource Strain: High Risk (07/08/2021)  Tobacco Use: Low Risk  (08/31/2022)     Readmission Risk Interventions     No data to display

## 2023-04-01 ENCOUNTER — Other Ambulatory Visit (HOSPITAL_COMMUNITY): Payer: Self-pay

## 2023-04-01 MED ORDER — BUPRENORPHINE HCL-NALOXONE HCL 8-2 MG SL SUBL
1.0000 | SUBLINGUAL_TABLET | Freq: Two times a day (BID) | SUBLINGUAL | 0 refills | Status: DC | PRN
  Filled 2023-04-03: qty 60, 30d supply, fill #0

## 2023-04-02 ENCOUNTER — Telehealth: Payer: Self-pay

## 2023-04-02 NOTE — Transitions of Care (Post Inpatient/ED Visit) (Signed)
   04/02/2023  Name: Kathy Rosales MRN: [id number] DOB: [birth year]  Today's TOC FU Call Status: Today's TOC FU Call Status:: Unsuccessful Call (1st Attempt) Unsuccessful Call (1st Attempt) Date: 04/02/23  Attempted to reach the patient regarding the most recent Inpatient/ED visit.  Follow Up Plan: Additional outreach attempts will be made to reach the patient to complete the Transitions of Care (Post Inpatient/ED visit) call.   Signature  Robyne Peers, RN

## 2023-04-03 ENCOUNTER — Other Ambulatory Visit (HOSPITAL_COMMUNITY): Payer: Self-pay

## 2023-04-03 ENCOUNTER — Telehealth: Payer: Self-pay

## 2023-04-03 ENCOUNTER — Encounter: Payer: Self-pay | Admitting: Hematology

## 2023-04-03 NOTE — Transitions of Care (Post Inpatient/ED Visit) (Signed)
   04/03/2023  Name: Kathy Rosales MRN: [id number] DOB: [birth year]  Today's TOC FU Call Status: Today's TOC FU Call Status:: Successful TOC FU Call Completed Unsuccessful Call (1st Attempt) Date: 04/02/23 [facility] FU Call Complete Date: 04/03/23 Patient's Name and Date of Birth confirmed.  Transition Care Management Follow-up Telephone Call Date of Discharge: 03/30/23 Discharge Facility: [facility]) Type of Discharge: Inpatient Admission Primary Inpatient Discharge Diagnosis:: acute on chronic systolic heart failure How have you been since you were released from the hospital?: Better Any questions or concerns?: No  Items Reviewed: Did you receive and understand the discharge instructions provided?: Yes Medications obtained,verified, and reconciled?: No (She said she has all medications and did not have any questions about the med regime and did not need to review the med list) Medications Not Reviewed Reasons:: Other: Any new allergies since your discharge?: No Dietary orders reviewed?: Yes Type of Diet Ordered:: heart healthy, low sodium Do you have support at home?: Yes People in Home: sibling(s), child(ren), dependent Name of Support/Comfort Primary Source: her sister and her daughter  Medications Reviewed Today: Medications Reviewed Today   Medications were not reviewed in this encounter     Home Care and Equipment/Supplies: Were Home Health Services Ordered?: No Any new equipment or medical supplies ordered?: No  Functional Questionnaire: Do you need assistance with bathing/showering or dressing?: No Do you need assistance with meal preparation?: No Do you need assistance with eating?: No Do you have difficulty maintaining continence: No Do you need assistance with getting out of bed/getting out of a chair/moving?: No Do you have difficulty managing or taking your medications?: No  Follow up appointments reviewed: PCP Follow-up appointment  confirmed?: Yes Date of PCP follow-up appointment?: 05/01/23 Follow-up Provider: Dr Vicci.  I offered her the option of seeing another provider at another clinic to be seen sooner and she said she wants to keep the appointment with Dr Vicci.  I also explained to her that we have a [facility] in the community that is available Mon-Thurs.  No appt needed and they see patients until 5:00 pm.She was very appreciative of the information. I told her that she can call this clinic with any questions about the location of the [facility] Follow-up appointment confirmed?: Yes Date of Specialist follow-up appointment?: 04/09/23 Follow-Up Specialty Provider:: [facility] Do you need transportation to your follow-up appointment?: No Do you understand care options if your condition(s) worsen?: Yes-patient verbalized understanding    SIGNATURE Slater Diesel, RN

## 2023-04-05 ENCOUNTER — Other Ambulatory Visit (HOSPITAL_COMMUNITY): Payer: Self-pay

## 2023-04-09 ENCOUNTER — Ambulatory Visit (HOSPITAL_COMMUNITY)
Admit: 2023-04-09 | Discharge: 2023-04-09 | Disposition: A | Discharge status: Home / Self Care | Payer: Self-pay | Source: Ambulatory Visit | Attending: Cardiology | Admitting: Cardiology

## 2023-04-09 ENCOUNTER — Encounter (HOSPITAL_COMMUNITY): Payer: Self-pay

## 2023-04-09 ENCOUNTER — Other Ambulatory Visit: Payer: Self-pay

## 2023-04-09 VITALS — BP 114/82 | HR 84 | Ht 60.0 in | Wt 125.2 lb

## 2023-04-09 DIAGNOSIS — I428 Other cardiomyopathies: Secondary | ICD-10-CM | POA: Insufficient documentation

## 2023-04-09 DIAGNOSIS — R9431 Abnormal electrocardiogram [ECG] [EKG]: Secondary | ICD-10-CM | POA: Insufficient documentation

## 2023-04-09 DIAGNOSIS — Z79899 Other long term (current) drug therapy: Secondary | ICD-10-CM | POA: Insufficient documentation

## 2023-04-09 DIAGNOSIS — Z95810 Presence of automatic (implantable) cardiac defibrillator: Secondary | ICD-10-CM | POA: Insufficient documentation

## 2023-04-09 DIAGNOSIS — I5022 Chronic systolic (congestive) heart failure: Secondary | ICD-10-CM | POA: Insufficient documentation

## 2023-04-09 DIAGNOSIS — I081 Rheumatic disorders of both mitral and tricuspid valves: Secondary | ICD-10-CM | POA: Insufficient documentation

## 2023-04-09 LAB — BASIC METABOLIC PANEL
Anion gap: 11 (ref 5–15)
BUN: 15 mg/dL (ref 6–20)
CO2: 26 mmol/L (ref 22–32)
Calcium: 8.4 mg/dL — ABNORMAL LOW (ref 8.9–10.3)
Chloride: 99 mmol/L (ref 98–111)
Creatinine, Ser: 0.89 mg/dL (ref 0.44–1.00)
GFR, Estimated: >60 mL/min (ref >60)
Glucose, Bld: 107 mg/dL — ABNORMAL HIGH (ref 70–99)
Potassium: 3.8 mmol/L (ref 3.5–5.1)
Sodium: 136 mmol/L (ref 135–145)

## 2023-04-09 LAB — BRAIN NATRIURETIC PEPTIDE: B Natriuretic Peptide: 133.4 pg/mL — ABNORMAL HIGH (ref 0.0–100.0)

## 2023-04-09 MED ORDER — TORSEMIDE 20 MG PO TABS
40.0000 mg | ORAL_TABLET | Freq: Every day | ORAL | 3 refills | Status: DC
  Filled 2023-04-09 – 2023-04-25 (×3): qty 60, 30d supply, fill #0

## 2023-04-09 MED ORDER — POTASSIUM CHLORIDE CRYS ER 20 MEQ PO TBCR
20.0000 meq | EXTENDED_RELEASE_TABLET | Freq: Every day | ORAL | 3 refills | Status: AC
  Filled 2023-04-09: qty 30, 30d supply, fill #0

## 2023-04-09 MED ORDER — DAPAGLIFLOZIN PROPANEDIOL 10 MG PO TABS
10.0000 mg | ORAL_TABLET | Freq: Every day | ORAL | 3 refills | Status: DC
  Filled 2023-04-09 – 2023-04-25 (×2): qty 30, 30d supply, fill #0

## 2023-04-09 MED ORDER — SPIRONOLACTONE 25 MG PO TABS
25.0000 mg | ORAL_TABLET | Freq: Every day | ORAL | 1 refills | Status: AC
  Filled 2023-04-09 – 2023-04-25 (×2): qty 90, 90d supply, fill #0
  Filled 2023-04-25: qty 30, 30d supply, fill #0

## 2023-04-09 MED ORDER — CARVEDILOL 3.125 MG PO TABS
3.1250 mg | ORAL_TABLET | Freq: Two times a day (BID) | ORAL | 1 refills | Status: AC
  Filled 2023-04-09: qty 180, 90d supply, fill #0
  Filled 2023-04-25 (×2): qty 60, 30d supply, fill #0

## 2023-04-09 MED ORDER — ENTRESTO 49-51 MG PO TABS
1.0000 | ORAL_TABLET | Freq: Two times a day (BID) | ORAL | 3 refills | Status: DC
  Filled 2023-04-09 – 2023-04-25 (×2): qty 60, 30d supply, fill #0

## 2023-04-09 NOTE — Patient Instructions (Signed)
 Medication Changes:  HEART MEDICATIONS ALL REFILLED   INCREASE SPIRONOLACTONE  TO 25MG  ONCE DAILY   INCREASE TORSEMIDE  TO 40MG  ONCE DAILY   Lab Work:  Labs done today, your results will be available in MyChart, we will contact you for abnormal readings.  THEN RETURN IN 1 WEEK AS SCHEDULED   Follow-Up in: 1 MONTH AS SCHEDULED   At the [facility], you and your health needs are our priority. We have a designated team specialized in the treatment of Heart Failure. This Care Team includes your primary Heart Failure Specialized Cardiologist (physician), Advanced Practice Providers (APPs- Physician Assistants and Nurse Practitioners), and Pharmacist who all work together to provide you with the care you need, when you need it.   You may see any of the following providers on your designated Care Team at your next follow up:  Dr. Toribio Fuel Dr. Ezra Shuck Dr. Ria Commander Dr. Odis Brownie Greig Mosses, NP Caffie Shed, [place] [facility] [place], [place] Beckey Coe, NP Jordan Lee, NP Tinnie Redman, PharmD   Please be sure to bring in all your medications bottles to every appointment.   Need to Contact Us :  If you have any questions or concerns before your next appointment please send us  a message through [place] or call our office at [phone].    TO LEAVE A MESSAGE FOR THE NURSE SELECT OPTION 2, PLEASE LEAVE A MESSAGE INCLUDING: YOUR NAME DATE OF BIRTH CALL BACK NUMBER REASON FOR CALL**this is important as we prioritize the call backs  YOU WILL RECEIVE A CALL BACK THE SAME DAY AS LONG AS YOU CALL BEFORE 4:00 PM

## 2023-04-09 NOTE — Progress Notes (Signed)
 ReDS Vest / Clip - 04/09/23 1205       ReDS Vest / Clip   Station Marker A    Ruler Value 25    ReDS Value Range Low volume    ReDS Actual Value 43

## 2023-04-09 NOTE — Progress Notes (Signed)
 [facility] Note    PCP: Vicci Barnie NOVAK, MD Primary Cardiologist: Dr. Pietro  HF Cardiologist: Dr Rolan   Reason for Visit/CC: Post hospital follow up for a/c systolic heart failure   HPI:  Kathy Rosales is a 48 y.o. female with a past medical history of NICM EF 25-30% (cath in 05/2016 with normal cors), moderate mitral regurgitation, and tricuspid regurgitation. No FH of coronary disease.   Diagnosed with CHF and  went to her PCP in January 2018 for dyspnea. Says she started getting SOB in the fall. Denies any type of viral illness. No family history of heart disease. Echo was performed and showed reduced EF 20-25% with mod-severe MR. at 25-30%. She was referred to Dr. Pietro who then set her up for a left and right heart cath. Admitted over night on 2/19 after cath due to changes in LOC after cath, was hypotensive. Symptoms resolved, head CT negative. Optimization of her medications was prohibited by hypotension prior to discharge. Started on corlanor.   Had CPX 07/15/16. Results as below. Immediately after, pt sat in chair and then had a pseudoseizure. No tonic/clonic activity. Pt taken to ED with Neurology consulted. Pt recovered spontaneously recovered. CT angio chest and CT head unremarkable.  Repeat echo was done in 4/18.  EF remained 20-25% with moderate diastolic dysfunction.   She is s/p [employer] ICD implantation 09/27/16. Admission complicated by AMS thought to be psychogenic. CT head, CTA head/neck and EEG were all negative.  Echo in 2/19 showed EF 35-40%.    Lost to follow up. Last seen 05/2018.   She followed up with EP 3/23 and endorsed new DOE. Echo arranged, showing EF 35-40%, LV function moderately decreased with LV global HK and grade II DD, normal RV. Entresto  increased.  She was unfortunately lost to f/u in the [facility] and not seen since 11/23 and had ran out of meds.   Recently admitted 12/24 for a/c CHF in the  setting of med noncompliance. Had admitted to running out of HF meds. She was admitted by internal medicine. Repeat Echo showed EF 20-25%, mod MR, RV mildly reduced. She was diuresed w/ IV Lasix  and placed back on oral GDMT.   She presents today for post hospital f/u. Here w/ interpreter. Overall, she feels her breathing has improved. Denies exertional dyspnea w/ basic ADLs and walking on flat surfaces but reports continued 2 pillow orthopnea. No LEE. ReDs elevated at 42%. She reports full compliance w/ medications. Has all pill bottles with her. Her BP is controlled, 114/82.    Labs (6/18): K 3.5, creatinine 0.71, hgb 13.1 Labs (1/19): digoxin  < 0.2 Labs (3/19): K 3.1, creatinine 0.63 Labs (3/23): K 3.6, creatinine 0.64, digoxin  0.4 Labs (4/23): K 3.7, creatinine 0.59, a1c 5.5 Labs (5/23): K 4.0, creatinine 0.63 Labs (7/23): K 3.9, creatinine 0.6 Labs (12/24): K 3.6, creatinine 0.65   PMH: 1. Chronic systolic CHF: Nonischemic cardiomyopathy.  - LHC/RHC (2/18) with no significant coronary disease. Mean RA 3, mean PCWP 20, CI 2.88.  - Echo (1/18) with EF 20-25%, moderate-severe MR.   - CPX (4/18): peak VO2 14.1, VE/VCO2 slope 59, RER 0.96 (submaximal).  - Echo (4/18): EF 20-25%.  - [employer] subcutaneous ICD - Echo (2/19): EF 35-40%, mild MR.  - Echo (3/23): EF 35-40%, LV function moderately decreased with LV global HK and grade II DD, normal RV. 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  3. H/o Still's disease.   SH: Lives with  Mom. No alcohol or drug abuse. Does not smoke cigarettes  FH: No history of CAD or cardiomyopathy.     Review of systems complete and found to be negative unless listed in HPI.    Current Outpatient Medications  Medication Sig Dispense Refill   buprenorphine -naloxone  (SUBOXONE ) 8-2 mg SUBL SL tablet Place 1 tablet under the tongue 2 (two) times daily as needed. 60 tablet 0   carvedilol  (COREG ) 3.125 MG tablet Take 1 tablet (3.125 mg total) by mouth 2 (two) times  daily with a meal. 60 tablet 0   dapagliflozin  propanediol (FARXIGA ) 10 MG TABS tablet Take 1 tablet (10 mg total) by mouth daily. 30 tablet 0   PARoxetine  (PAXIL ) 20 MG tablet Take 1 tablet (20 mg total) by mouth daily. 30 tablet 3   potassium chloride  SA (KLOR-CON  M) 20 MEQ tablet Take 1 tablet (20 mEq total) by mouth daily. 30 tablet 0   sacubitril -valsartan  (ENTRESTO ) 49-51 MG Take 1 tablet by mouth 2 (two) times daily. 60 tablet 0   spironolactone  (ALDACTONE ) 25 MG tablet Take 0.5 tablets (12.5 mg total) by mouth daily. 15 tablet 0   torsemide  (DEMADEX ) 20 MG tablet Take 1 tablet (20 mg total) by mouth daily. 30 tablet 0   traZODone  (DESYREL ) 100 MG tablet Take 1-2 tablets (100-200 mg total) by mouth at bedtime as needed. 60 tablet 3   No current facility-administered medications for this visit.   LMP 03/23/2023   Wt Readings from Last 3 Encounters:  03/30/23 57.3 kg (126 lb 4.8 oz)  02/10/22 59.7 kg (131 lb 9.6 oz)  12/02/21 64 kg (141 lb)   PHYSICAL EXAM: ReDs 42%  General:  Well appearing. No respiratory difficulty HEENT: normal Neck: supple. no JVD. Carotids 2+ bilat; no bruits. No lymphadenopathy or thyromegaly appreciated. Cor: PMI nondisplaced. Regular rate & rhythm. No rubs, gallops or murmurs. Lungs: clear Abdomen: soft, nontender, nondistended. No hepatosplenomegaly. No bruits or masses. Good bowel sounds. Extremities: no cyanosis, clubbing, rash, edema Neuro: alert & oriented x 3, cranial nerves grossly intact. moves all 4 extremities w/o difficulty. Affect pleasant.   ASSESSMENT & PLAN: 1. Chronic Systolic CHF: Nonischemic cardiomyopathy,  LHC 05/2016 no coronary disease.  ?Viral myocarditis as trigger.  [employer] subcutaneous ICD.  Echo in 2/19 with EF 35-40%, mild MR. Echo 3/23 with EF stable at 35-40%. Echo 12/24 EF 20-25%, mod MR, RV mildly reduced (had been out of heart failure medications for several months)  - NYHA Class II + 2 pillow orthopnea. ReDs  42%.  - Increase Torsemide  to 40 mg daily  - Increase spironolactone  to 25 mg daily  - Continue Entresto  49/51 mg bid. BP too soft for titration currently  - Continue Farxiga  10 mg daily. - Continue Coreg  3.125 mg bid.  - BMP/BNP today and again in 1 wk  - She has SQ [employer] ICD, followed by Dr. Waddell   2. Mitral regurgitation:  Mild on Echo 3/23. Mod on echo 12/24. Likely functional  - HF optimization per above - will follow   Discussed importance of remaining compliant w/ medications and keeping routine f/u appts in the [facility].   F/u w/ APP in 4 wks.   Caffie Shed, PA-C  04/09/2023

## 2023-04-16 ENCOUNTER — Other Ambulatory Visit (HOSPITAL_COMMUNITY): Payer: Self-pay

## 2023-04-17 ENCOUNTER — Ambulatory Visit (HOSPITAL_COMMUNITY)
Admission: RE | Admit: 2023-04-17 | Discharge: 2023-04-17 | Disposition: A | Discharge status: Home / Self Care | Payer: Self-pay | Source: Ambulatory Visit | Attending: Cardiology | Admitting: Cardiology

## 2023-04-17 DIAGNOSIS — I5022 Chronic systolic (congestive) heart failure: Secondary | ICD-10-CM | POA: Insufficient documentation

## 2023-04-17 LAB — BASIC METABOLIC PANEL
Anion gap: 9 (ref 5–15)
BUN: 15 mg/dL (ref 6–20)
CO2: 27 mmol/L (ref 22–32)
Calcium: 8.7 mg/dL — ABNORMAL LOW (ref 8.9–10.3)
Chloride: 103 mmol/L (ref 98–111)
Creatinine, Ser: 1.01 mg/dL — ABNORMAL HIGH (ref 0.44–1.00)
GFR, Estimated: >60 mL/min (ref >60)
Glucose, Bld: 107 mg/dL — ABNORMAL HIGH (ref 70–99)
Potassium: 4 mmol/L (ref 3.5–5.1)
Sodium: 139 mmol/L (ref 135–145)

## 2023-04-25 ENCOUNTER — Other Ambulatory Visit (HOSPITAL_COMMUNITY): Payer: Self-pay

## 2023-04-25 ENCOUNTER — Encounter: Payer: Self-pay | Admitting: Hematology

## 2023-05-01 ENCOUNTER — Ambulatory Visit: Payer: Self-pay | Attending: Internal Medicine | Admitting: Internal Medicine

## 2023-05-01 ENCOUNTER — Other Ambulatory Visit (HOSPITAL_COMMUNITY): Payer: Self-pay

## 2023-05-01 ENCOUNTER — Encounter: Payer: Self-pay | Admitting: Hematology

## 2023-05-01 VITALS — BP 113/65 | HR 79 | Wt 123.0 lb

## 2023-05-01 DIAGNOSIS — I340 Nonrheumatic mitral (valve) insufficiency: Secondary | ICD-10-CM

## 2023-05-01 DIAGNOSIS — Z09 Encounter for follow-up examination after completed treatment for conditions other than malignant neoplasm: Secondary | ICD-10-CM

## 2023-05-01 DIAGNOSIS — I5022 Chronic systolic (congestive) heart failure: Secondary | ICD-10-CM

## 2023-05-01 DIAGNOSIS — Z23 Encounter for immunization: Secondary | ICD-10-CM

## 2023-05-01 DIAGNOSIS — E611 Iron deficiency: Secondary | ICD-10-CM

## 2023-05-01 MED ORDER — BUPRENORPHINE HCL-NALOXONE HCL 8-2 MG SL SUBL
1.0000 | SUBLINGUAL_TABLET | Freq: Two times a day (BID) | SUBLINGUAL | 0 refills | Status: DC | PRN
  Filled 2023-05-01: qty 60, 30d supply, fill #0

## 2023-05-01 NOTE — Progress Notes (Unsigned)
Depression for the last two weeks worsening  95

## 2023-05-01 NOTE — Progress Notes (Unsigned)
Patient ID: Kathy Rosales, female    DOB: 07-04-1975  MRN: [id number]  CC: Hospitalization Follow-up (Question about carvedlo and proitine. ), Nausea, and Headache (Orthstatic  dizziness )   Subjective: Kathy Rosales is a 48 y.o. female who presents for hospital follow-up. Family friend Mr. 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  Followed by [facility]ology), migraine HA.    [employer] Language interpreter used during this encounter. #[id number], Alonna Minium  Patient was hospitalized 12/26-20 10/2022 with acute decompensated CHF most likely due to medication noncompliance.  Cardiac enzymes were negative.  Echo revealed EF of 20 to 25% with moderate mitral valve regurgitation.  Restarted on GDMT including Entresto 45/51 mg twice a day, carvedilol 3.125 mg twice a day, Farxiga 10 mg daily, spironolactone 12.5 mg daily, torsemide 20 mg daily with potassium 20 mEq daily.  It was also noted that patient was on Suboxone 8-2 mg twice a day for chronic back pain.  She was continued on Paxil 20 mg daily and trazodone 100 mg at bedtime for MDD.  Today: She has seen cardiology PA post hospital discharge on 04/09/23.  On that visit, spironolactone was increased to 25 mg daily and torsemide to 40 mg daily.  Patient and her friend tells me that after she was seen, she was experiencing 4 pillow orthopnea 5 days ago for several days.  She contacted cardiology and was told to increase to torsemide to 60 mg daily for 3 days then go back to 20 mg daily.  Since then weight has come down 4 to 5 pounds.  No lower extremity edema at this time.  She tells me she stopped taking potassium supplement a few days ago because it causes nausea. -Reports compliance with all of her other heart medications listed above. -Her friend Mr. Onalee Hua reports that he is a retired Administrator, arts.   He is concerned about the potential interaction with Paxil and carvedilol stating that the Paxil can increase concentration of carvedilol.  Looks like patient has been on the Paxil 20 mg for quite some time.  She was previously on 12.5 mg of carvedilol but had not taken her medicine for quite some time prior to her hospitalization last month.  Carvedilol was restarted at 3.125 mg twice a day.  Patient was found to have iron deficiency and received iron infusion while in the hospital.  She also mentions to me that she was diagnosed with bone cancer 9 years ago in [place] that affected the bones in her forearms and upper legs.  She states that she was treated with a chemotherapy pill.  Has not mentioned this to me on previous visits.  HM: She is agreeable to receiving flu and pneumonia vaccines.   Patient Active Problem List   Diagnosis Date Noted   Acute HFrEF (heart failure with reduced ejection fraction) (HCC) 03/29/2023   Recurrent major depressive disorder, in full remission (HCC) 06/04/2020   Vitamin D deficiency 06/18/2019   Migraine without aura and without status migrainosus, not intractable 05/16/2019   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   Rheumatoid arthritis of multiple sites with negative rheumatoid factor (HCC) 11/19/2017   Left-sided weakness    Unresponsive    Chronic systolic heart failure (HCC) 05/22/2016   Mitral regurgitation 05/22/2016   Near syncope 05/22/2016   Delirium due to multiple etiologies,  persistent, mixed level of activity 12/13/2015   MDD (major depressive disorder), single episode, moderate (HCC) 12/13/2015   Fatigue 12/01/2015   Helicobacter positive gastritis 08/06/2015   Gastric ulcer 08/06/2015   Iron deficiency anemia due to chronic blood loss 08/06/2015   Anemia 07/21/2015   Iron deficiency anemia 10/22/2014   B12 deficiency 10/22/2014   Symptomatic anemia 08/28/2014   Headache 08/28/2014   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HCC)  08/28/2014     Current Outpatient Medications on File Prior to Visit  Medication Sig Dispense Refill   carvedilol (COREG) 3.125 MG tablet Take 1 tablet (3.125 mg total) by mouth 2 (two) times daily with a meal. 180 tablet 1   dapagliflozin propanediol (FARXIGA) 10 MG TABS tablet Take 1 tablet (10 mg total) by mouth daily. 30 tablet 3   PARoxetine (PAXIL) 20 MG tablet Take 1 tablet (20 mg total) by mouth daily. 30 tablet 3   potassium chloride SA (KLOR-CON M) 20 MEQ tablet Take 1 tablet (20 mEq total) by mouth daily. (Patient not taking: Reported on 05/01/2023) 30 tablet 3   sacubitril-valsartan (ENTRESTO) 49-51 MG Take 1 tablet by mouth 2 (two) times daily. 60 tablet 3   spironolactone (ALDACTONE) 25 MG tablet Take 1 tablet (25 mg total) by mouth daily. 90 tablet 1   torsemide (DEMADEX) 20 MG tablet Take 2 tablets (40 mg total) by mouth daily. 60 tablet 3   traZODone (DESYREL) 100 MG tablet Take 1-2 tablets (100-200 mg total) by mouth at bedtime as needed. 60 tablet 3   [DISCONTINUED] SUMAtriptan (IMITREX) 25 MG tablet Take 2 tablets (50 mg total) by mouth every 2 (two) hours as needed for migraine. May repeat in 2 hours if headache persists or recurs. 10 tablet 0   No current facility-administered medications on file prior to visit.    Allergies  Allergen Reactions   Tramadol Itching and Other (See Comments)    Red spots all over body and itching   Furosemide Rash   Leflunomide Rash    Social History   Socioeconomic History   Marital status: Single    Spouse name: Not on file   Number of children: 1   Years of education: Not on file   Highest education level: Not on file  Occupational History   Occupation: unemployeed  Tobacco Use   Smoking status: Never   Smokeless tobacco: Never  Vaping Use   Vaping status: Never Used  Substance and Sexual Activity   Alcohol use: No   Drug use: No   Sexual activity: Not Currently    Birth control/protection: None  Other Topics Concern    Not on file  Social History Narrative   ** Merged History Encounter **       ** Merged History Encounter **       Social Drivers of Corporate investment banker Strain: High Risk (07/08/2021)   Overall Financial Resource Strain (CARDIA)    Difficulty of Paying Living Expenses: Hard  Food Insecurity: No Food Insecurity (03/30/2023)   Hunger Vital Sign    Worried About Running Out of Food in the Last Year: Never true    Ran Out of Food in the Last Year: Never true  Transportation Needs: No Transportation Needs (03/30/2023)   PRAPARE - Administrator, Civil Service (Medical): No    Lack of Transportation (Non-Medical): No  Physical Activity: Not on file  Stress: Not on file  Social Connections: Not on file  Intimate Partner Violence: Not At  Risk (03/30/2023)   Humiliation, Afraid, Rape, and Kick questionnaire    Fear of Current or Ex-Partner: No    Emotionally Abused: No    Physically Abused: No    Sexually Abused: No    Family History  Problem Relation Age of Onset   Cancer Paternal Grandfather    Mental illness Neg Hx     Past Surgical History:  Procedure Laterality Date   COLONOSCOPY WITH PROPOFOL N/A 07/23/2015   Procedure: COLONOSCOPY WITH PROPOFOL;  Surgeon: Bernette Redbird, MD;  Location: [facility];  Service: Endoscopy;  Laterality: N/A;   ESOPHAGOGASTRODUODENOSCOPY (EGD) WITH PROPOFOL N/A 07/23/2015   Procedure: ESOPHAGOGASTRODUODENOSCOPY (EGD) WITH PROPOFOL;  Surgeon: Bernette Redbird, MD;  Location: [facility];  Service: Endoscopy;  Laterality: N/A;   ESOPHAGOGASTRODUODENOSCOPY (EGD) WITH PROPOFOL N/A 11/18/2015   Procedure: ESOPHAGOGASTRODUODENOSCOPY (EGD) WITH PROPOFOL;  Surgeon: Bernette Redbird, MD;  Location: [facility];  Service: Endoscopy;  Laterality: N/A;   RIGHT/LEFT HEART CATH AND CORONARY ANGIOGRAPHY N/A 05/22/2016   Procedure: Right/Left Heart Cath and Coronary Angiography;  Surgeon: Peter M Swaziland, MD;  Location: [facility] INVASIVE CV LAB;   Service: Cardiovascular;  Laterality: N/A;   SUBQ ICD IMPLANT N/A 09/27/2016   Procedure: SubQ ICD Implant;  Surgeon: Marinus Maw, MD;  Location: [facility] INVASIVE CV LAB;  Service: Cardiovascular;  Laterality: N/A;    ROS: Review of Systems Negative except as stated above  PHYSICAL EXAM: BP 113/65   Pulse 79   Wt 123 lb (55.8 kg)   SpO2 96%   BMI 24.02 kg/m   Wt Readings from Last 3 Encounters:  05/01/23 123 lb (55.8 kg)  04/09/23 125 lb 3.2 oz (56.8 kg)  03/30/23 126 lb 4.8 oz (57.3 kg)    Physical Exam  General appearance - alert, well appearing, middle age Hispanic FM and in no distress. Hx was a bit difficult as sometime pt,or her friend would speak at the same time interpreter was speaking Mental status - normal mood, behavior, speech, dress, motor activity, and thought processes Neck - supple, no significant adenopathy Chest - clear to auscultation, no wheezes, rales or rhonchi, symmetric air entry Heart - normal rate, regular rhythm, normal S1, S2, no murmurs, rubs, clicks or gallops Extremities - peripheral pulses normal, no pedal edema, no clubbing or cyanosis      Latest Ref Rng & Units 05/01/2023    4:34 PM 04/17/2023   10:19 AM 04/09/2023    1:01 PM  CMP  Glucose 70 - 99 mg/dL 94  283  151   BUN 6 - 24 mg/dL 12  15  15    Creatinine 0.57 - 1.00 mg/dL 7.61  6.07  3.71   Sodium 134 - 144 mmol/L 140  139  136   Potassium 3.5 - 5.2 mmol/L 4.1  4.0  3.8   Chloride 96 - 106 mmol/L 96  103  99   CO2 20 - 29 mmol/L 29  27  26    Calcium 8.7 - 10.2 mg/dL 8.9  8.7  8.4    Lipid Panel     Component Value Date/Time   CHOL 157 03/29/2023 1551   TRIG 94 03/29/2023 1551   HDL 81 03/29/2023 1551   CHOLHDL 1.9 03/29/2023 1551   VLDL 19 03/29/2023 1551   LDLCALC 57 03/29/2023 1551    CBC    Component Value Date/Time   WBC 5.2 05/01/2023 1634   WBC 4.5 03/30/2023 0304   RBC 5.06 05/01/2023 1634   RBC 4.54 03/30/2023 0304  HGB 14.8 05/01/2023 1634   HGB 11.2 (L)  11/30/2015 0944   HCT 45.0 05/01/2023 1634   HCT 36.5 11/30/2015 0944   PLT 213 05/01/2023 1634   MCV 89 05/01/2023 1634   MCV 83.0 11/30/2015 0944   MCH 29.2 05/01/2023 1634   MCH 28.9 03/30/2023 0304   MCHC 32.9 05/01/2023 1634   MCHC 33.0 03/30/2023 0304   RDW 13.9 05/01/2023 1634   RDW 19.4 (H) 11/30/2015 0944   LYMPHSABS 0.9 06/21/2020 1416   LYMPHSABS 0.8 06/04/2020 1007   LYMPHSABS 0.4 (L) 11/30/2015 0944   MONOABS 0.5 06/21/2020 1416   MONOABS 0.2 11/30/2015 0944   EOSABS 0.1 06/21/2020 1416   EOSABS 0.2 06/04/2020 1007   BASOSABS 0.1 06/21/2020 1416   BASOSABS 0.1 06/04/2020 1007   BASOSABS 0.0 11/30/2015 0944    ASSESSMENT AND PLAN: 1. Hospital discharge follow-up (Primary)  2. Chronic systolic heart failure (HCC) Currently compensated and weight is down 2 pounds since she was last seen by the cardiology PA earlier this month.  Advised patient to continue taking her medications including torsemide 40 mg daily (pt states she is taking only 20 mg), spironolactone 25 mg daily, Entresto 49/51 mg twice a day, Farxiga 10 mg daily.  We will check potassium level to see where her level is at given that she has not been taking the potassium consistently.  In regards to the potential interaction between Paxil and carvedilol, she is on a low-dose of carvedilol.  She has been stable on Paxil for years.  I confirmed with our clinical pharmacist that Paxil can increase concentrations of carvedilol but he reported that he has not seen a clinic consequence so long as the carvedilol dose is decreased and/or the SSRI/SNRI is started low and titrated slowly over time.  Beta-blockers like bisoprolol, atenolol are not metabolized in the same way and would not be affected by Paxil.  Advised patient that I will send a message to her cardiologist informing of this to see whether they would consider changing her current beta-blocker.  I would hate to pull her off Paxil if she has been on this dose  stable for a while - Basic Metabolic Panel  3. Nonrheumatic mitral valve regurgitation See #2 above  4. Iron deficiency Will recheck iron level. Advised patient to apply for the orange card/cone discount.  Once approved, please let me know as I would like to get her in with oncology for the iron deficiency and also for follow-up on her reported history of bone cancer.  I also request that she get her records from [place] regarding the diagnosis of bone cancer.  She expressed understanding. - CBC - Iron, TIBC and Ferritin Panel  5. Need for influenza vaccination - Flu vaccine trivalent PF, 6mos and older(Flulaval,Afluria,Fluarix,Fluzone)  6. Need for vaccination against Streptococcus pneumoniae - Pneumococcal conjugate vaccine 20-valent    Patient was given the opportunity to ask questions.  Patient verbalized understanding of the plan and was able to repeat key elements of the plan.   This documentation was completed using Paediatric nurse.  Any transcriptional errors are unintentional.  Orders Placed This Encounter  Procedures   Flu vaccine trivalent PF, 6mos and older(Flulaval,Afluria,Fluarix,Fluzone)   Pneumococcal conjugate vaccine 20-valent   CBC   Iron, TIBC and Ferritin Panel   Basic Metabolic Panel     Requested Prescriptions    No prescriptions requested or ordered in this encounter    Return in about 3 months (around 07/30/2023).  [name], MD, FACP

## 2023-05-01 NOTE — Patient Instructions (Signed)
I will send a message to your cardiologist about the carvedilol and potential interaction with paroxetine and get back to you.  We will check your iron level today, your blood count and your electrolytes.  Please get your records from [place] regarding the history of bone cancer.  Please apply for the orange card/cone discount card.  Let me know once approved as I would like to refer you to the oncologist for both the iron deficiency and the history of bone cancer.

## 2023-05-02 ENCOUNTER — Telehealth: Payer: Self-pay | Admitting: Internal Medicine

## 2023-05-02 ENCOUNTER — Other Ambulatory Visit (HOSPITAL_COMMUNITY): Payer: Self-pay

## 2023-05-02 DIAGNOSIS — E611 Iron deficiency: Secondary | ICD-10-CM | POA: Insufficient documentation

## 2023-05-02 LAB — BASIC METABOLIC PANEL
BUN/Creatinine Ratio: 13 (ref 9–23)
BUN: 12 mg/dL (ref 6–24)
CO2: 29 mmol/L (ref 20–29)
Calcium: 8.9 mg/dL (ref 8.7–10.2)
Chloride: 96 mmol/L (ref 96–106)
Creatinine, Ser: 0.95 mg/dL (ref 0.57–1.00)
Glucose: 94 mg/dL (ref 70–99)
Potassium: 4.1 mmol/L (ref 3.5–5.2)
Sodium: 140 mmol/L (ref 134–144)
eGFR: 74 mL/min/{1.73_m2} (ref >59)

## 2023-05-02 LAB — IRON,TIBC AND FERRITIN PANEL
Ferritin: 54 ng/mL (ref 15–150)
Iron Saturation: 20 % (ref 15–55)
Iron: 75 ug/dL (ref 27–159)
Total Iron Binding Capacity: 367 ug/dL (ref 250–450)
UIBC: 292 ug/dL (ref 131–425)

## 2023-05-02 LAB — CBC
Hematocrit: 45 % (ref 34.0–46.6)
Hemoglobin: 14.8 g/dL (ref 11.1–15.9)
MCH: 29.2 pg (ref 26.6–33.0)
MCHC: 32.9 g/dL (ref 31.5–35.7)
MCV: 89 fL (ref 79–97)
Platelets: 213 10*3/uL (ref 150–450)
RBC: 5.06 x10E6/uL (ref 3.77–5.28)
RDW: 13.9 % (ref 11.7–15.4)
WBC: 5.2 10*3/uL (ref 3.4–10.8)

## 2023-05-02 NOTE — Telephone Encounter (Signed)
Phone call placed to patient today using [employer] interpre[employer] [facility] [phone]).  I wanted to clarify whether patient was taking the Paxil consistently up to this point.  I left a message on her voicemail informing of who I am and that I will try to reach her again later.

## 2023-05-03 ENCOUNTER — Other Ambulatory Visit (HOSPITAL_COMMUNITY): Payer: Self-pay

## 2023-05-03 NOTE — Progress Notes (Incomplete)
Advanced Heart Failure Clinic Note    PCP: Marcine Matar, MD Primary Cardiologist: Dr. Jens Som  HF Cardiologist: Dr Shirlee Latch   Reason for Visit/CC: Systolic heart failure f/u  HPI:  Kathy Rosales is a 48 y.o. female with a past medical history of NICM EF 25-30% (cath in 05/2016 with normal cors), moderate mitral regurgitation, and tricuspid regurgitation. No FH of coronary disease.   Diagnosed with CHF and  went to her PCP in January 2018 for dyspnea. Says she started getting SOB in the fall. Denies any type of viral illness. No family history of heart disease. Echo was performed and showed reduced EF 20-25% with mod-severe MR. at 25-30%. She was referred to Dr. Jens Som who then set her up for a left and right heart cath. Admitted over night on 2/19 after cath due to changes in LOC after cath, was hypotensive. Symptoms resolved, head CT negative. Optimization of her medications was prohibited by hypotension prior to discharge. Started on corlanor.   Had CPX 07/15/16. Results as below. Immediately after, pt sat in chair and then had a pseudoseizure. No tonic/clonic activity. Pt taken to ED with Neurology consulted. Pt recovered spontaneously recovered. CT angio chest and CT head unremarkable.  Repeat echo was done in 4/18.  EF remained 20-25% with moderate diastolic dysfunction.   She is s/p [employer] ICD implantation 09/27/16. Admission complicated by AMS thought to be psychogenic. CT head, CTA head/neck and EEG were all negative.  Echo in 2/19 showed EF 35-40%.    Lost to follow up. Last seen 05/2018.   She followed up with EP 3/23 and endorsed new DOE. Echo arranged, showing EF 35-40%, LV function moderately decreased with LV global HK and grade II DD, normal RV. Entresto increased.  She was unfortunately lost to f/u in the [facility] and not seen since 11/23 and had ran out of meds.   Admitted 12/24 for a/c CHF in the setting of med noncompliance. Had admitted  to running out of HF meds. She was admitted by internal medicine. Echo showed EF 20-25%, mod MR, RV mildly reduced. She was diuresed w/ IV Lasix and placed back on oral GDMT.   Today she returns for AHF follow up. Overall feeling ***. Denies palpitations, CP, dizziness, edema, or PND/Orthopnea. *** SOB. Appetite ok. No fever or chills. Weight at home *** pounds. Taking all medications. Denies ETOH, tobacco or drug use.     PMH: 1. Chronic systolic CHF: Nonischemic cardiomyopathy.  - LHC/RHC (2/18) with no significant coronary disease. Mean RA 3, mean PCWP 20, CI 2.88.  - Echo (1/18) with EF 20-25%, moderate-severe MR.   - CPX (4/18): peak VO2 14.1, VE/VCO2 slope 59, RER 0.96 (submaximal).  - Echo (4/18): EF 20-25%.  - [employer] subcutaneous ICD - Echo (2/19): EF 35-40%, mild MR.  - Echo (3/23): EF 35-40%, LV function moderately decreased with LV global HK and grade II DD, normal RV. 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  3. H/o Still's disease.   SH: Lives with Mom. No alcohol or drug abuse. Does not smoke cigarettes  FH: No history of CAD or cardiomyopathy.     Review of systems complete and found to be negative unless listed in HPI.    Current Outpatient Medications  Medication Sig Dispense Refill   buprenorphine-naloxone (SUBOXONE) 8-2 mg SUBL SL tablet Place 1 tablet under the tongue 2 (two) times daily as needed. 60 tablet 0   carvedilol (COREG) 3.125 MG tablet Take 1 tablet (  3.125 mg total) by mouth 2 (two) times daily with a meal. 180 tablet 1   dapagliflozin propanediol (FARXIGA) 10 MG TABS tablet Take 1 tablet (10 mg total) by mouth daily. 30 tablet 3   PARoxetine (PAXIL) 20 MG tablet Take 1 tablet (20 mg total) by mouth daily. 30 tablet 3   potassium chloride SA (KLOR-CON M) 20 MEQ tablet Take 1 tablet (20 mEq total) by mouth daily. (Patient not taking: Reported on 05/01/2023) 30 tablet 3   sacubitril-valsartan (ENTRESTO) 49-51 MG Take 1 tablet by mouth 2 (two) times daily. 60  tablet 3   spironolactone (ALDACTONE) 25 MG tablet Take 1 tablet (25 mg total) by mouth daily. 90 tablet 1   torsemide (DEMADEX) 20 MG tablet Take 2 tablets (40 mg total) by mouth daily. 60 tablet 3   traZODone (DESYREL) 100 MG tablet Take 1-2 tablets (100-200 mg total) by mouth at bedtime as needed. 60 tablet 3   No current facility-administered medications for this visit.   There were no vitals taken for this visit.  Wt Readings from Last 3 Encounters:  05/01/23 55.8 kg (123 lb)  04/09/23 56.8 kg (125 lb 3.2 oz)  03/30/23 57.3 kg (126 lb 4.8 oz)   PHYSICAL EXAM: General:  *** appearing.  No respiratory difficulty HEENT: normal Neck: supple. JVD *** cm. Carotids 2+ bilat; no bruits. No lymphadenopathy or thyromegaly appreciated. Cor: PMI nondisplaced. Regular rate & rhythm. No rubs, gallops or murmurs. Lungs: clear Abdomen: soft, nontender, nondistended. No hepatosplenomegaly. No bruits or masses. Good bowel sounds. Extremities: no cyanosis, clubbing, rash, edema  Neuro: alert & oriented x 3, cranial nerves grossly intact. moves all 4 extremities w/o difficulty. Affect pleasant.   ReDs reading: *** %, {Norm/abn:16337}   ASSESSMENT & PLAN: 1. Chronic Systolic CHF: Nonischemic cardiomyopathy,  LHC 05/2016 no coronary disease.  ?Viral myocarditis as trigger.  [employer] subcutaneous ICD.  Echo in 2/19 with EF 35-40%, mild MR. Echo 3/23 with EF stable at 35-40%. Echo 12/24 EF 20-25%, mod MR, RV mildly reduced (had been out of heart failure medications for several months)  - NYHA Class II + 2 pillow orthopnea. ReDs 42%.  - Increase Torsemide to 40 mg daily  - Increase spironolactone to 25 mg daily  - Continue Entresto 49/51 mg bid. BP too soft for titration currently  - Continue Farxiga 10 mg daily. - Continue Coreg 3.125 mg bid.  - BMP/BNP today and again in 1 wk  - She has SQ [employer] ICD, followed by Dr. Ladona Ridgel   2. Mitral regurgitation:  Mild on Echo 3/23. Mod on echo  12/24. Likely functional  - HF optimization per above - will follow   Discussed importance of remaining compliant w/ medications and keeping routine f/u appts in the [facility].   Follow up ***  Alen Bleacher, NP  05/03/2023

## 2023-05-04 ENCOUNTER — Telehealth (HOSPITAL_COMMUNITY): Payer: Self-pay | Admitting: Licensed Clinical Social Worker

## 2023-05-04 ENCOUNTER — Telehealth: Payer: Self-pay

## 2023-05-04 ENCOUNTER — Telehealth (HOSPITAL_COMMUNITY): Payer: Self-pay | Admitting: Pharmacy Technician

## 2023-05-04 ENCOUNTER — Telehealth (HOSPITAL_COMMUNITY): Payer: Self-pay | Admitting: *Deleted

## 2023-05-04 ENCOUNTER — Other Ambulatory Visit (HOSPITAL_COMMUNITY): Payer: Self-pay

## 2023-05-04 MED ORDER — DAPAGLIFLOZIN PROPANEDIOL 10 MG PO TABS: 10.0000 mg | ORAL_TABLET | Freq: Every day | ORAL | Status: DC

## 2023-05-04 MED ORDER — ENTRESTO 49-51 MG PO TABS: 1.0000 | ORAL_TABLET | Freq: Two times a day (BID) | ORAL | 0 refills | Status: AC

## 2023-05-04 NOTE — Telephone Encounter (Signed)
Advanced Heart Failure Patient Advocate Encounter  Patient was seen in clinic today and is currently uninsured. Sent in [place] assistance application to [facility], via fax.   If patient is approved, Marcelline Deist will be taken off the patients med list and London Pepper will be added.   Will follow up.

## 2023-05-04 NOTE — Telephone Encounter (Signed)
Advanced Heart Failure Patient Advocate Encounter  Patient was seen in clinic today and is currently uninsured. Sent in [place] assistance application to [employer], via fax.   Will follow up.

## 2023-05-04 NOTE — Telephone Encounter (Signed)
Pt was called and is aware of results, DOB was confirmed.  Interpreter id # [phone]

## 2023-05-04 NOTE — Telephone Encounter (Signed)
Pt walked in to office requesting Entresto and Farxiga samples.  Pt has no insurance and can not afford these 2 medications. Samples provided, pharmacy team starting paperwork for pt assistance, jenna CSW spoke w/pt

## 2023-05-04 NOTE — Telephone Encounter (Signed)
-----   Message from [name] sent at 05/02/2023  8:13 AM EST ----- Let patient know that her blood cell count is normal. Iron levels have improved. Kidney function is good.  Potassium level normal.

## 2023-05-04 NOTE — Telephone Encounter (Signed)
H&V Care Navigation CSW Progress Note  Clinical Social Worker consulted to meet with pt to discuss assistance with medical bills.  Patient came in to get samples of medications we had prescribed as she has no insurance to cover these medications.  CSW assisted in having patient sign patient assistance forms for Jardiance and Sherryll Burger- provided to patient advocates.  CSW then spoke with pt about possible Medicaid- provided with number to [employer] who has tried to reach patient to screen for Medicaid eligibility.  Also provided CAFA to patient- she has completed before so is familiar with process- understands she cannot apply until she is screened for Medicaid.   SDOH Screenings   Food Insecurity: No Food Insecurity (03/30/2023)  Housing: Unknown (03/30/2023)  Transportation Needs: No Transportation Needs (03/30/2023)  Utilities: Not At Risk (03/30/2023)  Depression (PHQ2-9): Low Risk  (08/31/2022)  Financial Resource Strain: High Risk (05/04/2023)  Tobacco Use: Low Risk  (04/09/2023)   Burna Sis, LCSW Clinical Social Worker Advanced Heart Failure Clinic Desk#: [phone] Cell#: [phone]

## 2023-05-07 ENCOUNTER — Telehealth: Payer: Self-pay | Admitting: Internal Medicine

## 2023-05-07 NOTE — Telephone Encounter (Signed)
Phone call placed to patient this morning.  I left a voicemail message informing of who I am and the phone number.  Requested that she give a call back. If patient calls back, let her know that I have heard back from the cardiologist, Dr. Shirlee Latch about the low-dose carvedilol in combination with paroxetine.  He feels it is okay for her to continue both for now.   Please find out from patient if she is taking the paroxetine consistently and if she is still plugged in with behavioral health.  If she is, I recommend that she speaks with her behavioral health specialist to see whether changing her to a different antidepressant would be okay.

## 2023-05-09 ENCOUNTER — Telehealth (HOSPITAL_COMMUNITY): Payer: Self-pay

## 2023-05-09 NOTE — Telephone Encounter (Signed)
 Called to confirm/remind patient of their appointment at the Advanced Heart Failure Clinic on 05/10/23. However, pt canceled her appointment and will keep her upcoming appt with DM.

## 2023-05-10 ENCOUNTER — Encounter (HOSPITAL_COMMUNITY): Payer: Self-pay

## 2023-05-14 NOTE — Telephone Encounter (Addendum)
Advanced Heart Failure Patient Advocate Encounter   Patient was approved to receive Jardiance from [facility]  Effective dates: 05/12/23 through 05/06/24  Document scanned to chart. Approval information left on patients vm via interpreter services.  Archer Asa, CPhT

## 2023-05-15 NOTE — Telephone Encounter (Signed)
Advanced Heart Failure Patient Advocate Encounter  Patient needs proof of income in order to be approved for assistance. This information was left on the patients vm via interpreter services.

## 2023-05-31 ENCOUNTER — Other Ambulatory Visit (HOSPITAL_COMMUNITY): Payer: Self-pay | Admitting: Psychiatry

## 2023-05-31 ENCOUNTER — Other Ambulatory Visit: Payer: Self-pay

## 2023-05-31 ENCOUNTER — Other Ambulatory Visit (HOSPITAL_COMMUNITY): Payer: Self-pay

## 2023-05-31 DIAGNOSIS — F3342 Major depressive disorder, recurrent, in full remission: Secondary | ICD-10-CM

## 2023-06-01 ENCOUNTER — Encounter (HOSPITAL_COMMUNITY): Payer: Self-pay | Admitting: Cardiology

## 2023-06-01 ENCOUNTER — Ambulatory Visit (HOSPITAL_COMMUNITY)
Admission: RE | Admit: 2023-06-01 | Discharge: 2023-06-01 | Disposition: A | Discharge status: Home / Self Care | Payer: Self-pay | Source: Ambulatory Visit | Attending: Cardiology | Admitting: Cardiology

## 2023-06-01 ENCOUNTER — Other Ambulatory Visit (HOSPITAL_COMMUNITY): Payer: Self-pay

## 2023-06-01 ENCOUNTER — Encounter: Payer: Self-pay | Admitting: Hematology

## 2023-06-01 ENCOUNTER — Other Ambulatory Visit: Payer: Self-pay

## 2023-06-01 VITALS — BP 90/58 | HR 79 | Wt 121.4 lb

## 2023-06-01 DIAGNOSIS — I081 Rheumatic disorders of both mitral and tricuspid valves: Secondary | ICD-10-CM | POA: Insufficient documentation

## 2023-06-01 DIAGNOSIS — Z79899 Other long term (current) drug therapy: Secondary | ICD-10-CM | POA: Insufficient documentation

## 2023-06-01 DIAGNOSIS — Z95810 Presence of automatic (implantable) cardiac defibrillator: Secondary | ICD-10-CM | POA: Insufficient documentation

## 2023-06-01 DIAGNOSIS — I428 Other cardiomyopathies: Secondary | ICD-10-CM | POA: Insufficient documentation

## 2023-06-01 DIAGNOSIS — I5022 Chronic systolic (congestive) heart failure: Secondary | ICD-10-CM

## 2023-06-01 LAB — BASIC METABOLIC PANEL
Anion gap: 14 (ref 5–15)
BUN: 10 mg/dL (ref 6–20)
CO2: 27 mmol/L (ref 22–32)
Calcium: 8.7 mg/dL — ABNORMAL LOW (ref 8.9–10.3)
Chloride: 101 mmol/L (ref 98–111)
Creatinine, Ser: 0.66 mg/dL (ref 0.44–1.00)
GFR, Estimated: >60 mL/min (ref >60)
Glucose, Bld: 78 mg/dL (ref 70–99)
Potassium: 3.7 mmol/L (ref 3.5–5.1)
Sodium: 142 mmol/L (ref 135–145)

## 2023-06-01 LAB — CBC
HCT: 45.9 % (ref 36.0–46.0)
Hemoglobin: 15.5 g/dL — ABNORMAL HIGH (ref 12.0–15.0)
MCH: 29.5 pg (ref 26.0–34.0)
MCHC: 33.8 g/dL (ref 30.0–36.0)
MCV: 87.4 fL (ref 80.0–100.0)
Platelets: 236 10*3/uL (ref 150–400)
RBC: 5.25 MIL/uL — ABNORMAL HIGH (ref 3.87–5.11)
RDW: 14.1 % (ref 11.5–15.5)
WBC: 4.8 10*3/uL (ref 4.0–10.5)
nRBC: 0 % (ref 0.0–0.2)

## 2023-06-01 LAB — BRAIN NATRIURETIC PEPTIDE: B Natriuretic Peptide: 446.9 pg/mL — ABNORMAL HIGH (ref 0.0–100.0)

## 2023-06-01 MED ORDER — TRAZODONE HCL 100 MG PO TABS
100.0000 mg | ORAL_TABLET | Freq: Every evening | ORAL | 3 refills | Status: DC | PRN
  Filled 2023-06-01: qty 60, 30d supply, fill #0
  Filled 2023-07-18: qty 60, 30d supply, fill #1
  Filled 2023-08-23: qty 60, 30d supply, fill #2
  Filled 2023-09-18: qty 60, 30d supply, fill #3

## 2023-06-01 MED ORDER — BUPRENORPHINE HCL-NALOXONE HCL 8-2 MG SL SUBL
1.0000 | SUBLINGUAL_TABLET | Freq: Two times a day (BID) | SUBLINGUAL | 0 refills | Status: DC | PRN
  Filled 2023-06-01: qty 60, 30d supply, fill #0

## 2023-06-01 MED ORDER — PAROXETINE HCL 20 MG PO TABS
20.0000 mg | ORAL_TABLET | Freq: Every day | ORAL | 3 refills | Status: DC
  Filled 2023-06-01: qty 30, 30d supply, fill #0
  Filled 2023-07-18: qty 30, 30d supply, fill #1
  Filled 2023-08-23: qty 30, 30d supply, fill #2
  Filled 2023-09-18: qty 30, 30d supply, fill #3

## 2023-06-01 NOTE — Patient Instructions (Signed)
 There has been no changes to your medications.  Labs done today, your results will be available in MyChart, we will contact you for abnormal readings.  Your physician has requested that you have a cardiac MRI. Cardiac MRI uses a computer to create images of your heart as its beating, producing both still and moving pictures of your heart and major blood vessels. For further information please visit [email or web address]. Please follow the instruction sheet given to you today for more information.  Your physician recommends that you schedule a follow-up appointment in: 1 month  If you have any questions or concerns before your next appointment please send [place] a message through [place] or call our office at [phone].    TO LEAVE A MESSAGE FOR THE NURSE SELECT OPTION 2, PLEASE LEAVE A MESSAGE INCLUDING: YOUR NAME DATE OF BIRTH CALL BACK NUMBER REASON FOR CALL**this is important as we prioritize the call backs  YOU WILL RECEIVE A CALL BACK THE SAME DAY AS LONG AS YOU CALL BEFORE 4:00 PM  At the [facility], you and your health needs are our priority. As part of our continuing mission to provide you with exceptional heart care, we have created designated Provider Care Teams. These Care Teams include your primary Cardiologist (physician) and Advanced Practice Providers (APPs- Physician Assistants and Nurse Practitioners) who all work together to provide you with the care you need, when you need it.   You may see any of the following providers on your designated Care Team at your next follow up: Dr Arvilla Meres Dr Marca Ancona Dr. Dorthula Nettles Dr. Clearnce Hasten Amy Filbert Schilder, NP Robbie Lis, [place] [facility] [place], [place] Brynda Peon, NP [place] Lee, NP Clarisa Kindred, NP Karle Plumber, PharmD Enos Fling, PharmD   Please be sure to bring in all your medications bottles to every appointment.    Thank you for choosing [facility]

## 2023-06-03 NOTE — Progress Notes (Signed)
 [facility] Note    PCP: Marcine Matar, MD Primary Cardiologist: Dr. Jens Som  HF Cardiologist: Dr Shirlee Latch   Reason for Visit/CC: CHF  HPI:  Kathy Rosales is a 48 y.o. female with a past medical history of NICM EF 25-30% (cath in 05/2016 with normal cors), moderate mitral regurgitation, and tricuspid regurgitation. No FH of coronary disease.   Diagnosed with CHF and  went to her PCP in January 2018 for dyspnea. Says she started getting SOB in the fall. Denies any type of viral illness. No family history of heart disease. Echo was performed and showed reduced EF 20-25% with mod-severe MR. at 25-30%. She was referred to Dr. Jens Som who then set her up for a left and right heart cath. Admitted over night on 2/19 after cath due to changes in LOC after cath, was hypotensive. Symptoms resolved, head CT negative. Optimization of her medications was prohibited by hypotension prior to discharge. Started on corlanor.   Had CPX 07/15/16. Results as below. Immediately after, pt sat in chair and then had a pseudoseizure. No tonic/clonic activity. Pt taken to ED with Neurology consulted. Pt recovered spontaneously recovered. CT angio chest and CT head unremarkable.  Repeat echo was done in 4/18.  EF remained 20-25% with moderate diastolic dysfunction.   She is s/p [employer] ICD implantation 09/27/16. Admission complicated by AMS thought to be psychogenic. CT head, CTA head/neck and EEG were all negative.  Echo in 2/19 showed EF 35-40%.    Lost to follow up. Last seen 05/2018.   She followed up with EP 3/23 and endorsed new DOE. Echo arranged, showing EF 35-40%, LV function moderately decreased with LV global HK and grade II DD, normal RV. Entresto increased.  She was unfortunately lost to f/u in the [facility] and not seen since 11/23 and had ran out of meds.   Admitted 12/24 for a/c CHF in the setting of med noncompliance. Had admitted to running out of HF  meds. She was admitted by internal medicine. Repeat Echo showed EF 20-25%, mod MR, RV mildly reduced. She was diuresed w/ IV Lasix and placed back on oral GDMT.   She presents today for CHF followup. Here w/ interpreter.  Weight is down 4 lbs since last appointment.  Breathing has improved, still with some dyspnea walking up stairs.  No orthopnea/PND.  No lightheadedness.  No palpitations.   ECG (personally reviewed): NSR, nonspecific T wave flattening  Labs (6/18): K 3.5, creatinine 0.71, hgb 13.1 Labs (1/19): digoxin < 0.2 Labs (3/19): K 3.1, creatinine 0.63 Labs (3/23): K 3.6, creatinine 0.64, digoxin 0.4 Labs (4/23): K 3.7, creatinine 0.59, a1c 5.5 Labs (5/23): K 4.0, creatinine 0.63 Labs (7/23): K 3.9, creatinine 0.6 Labs (12/24): K 3.6, creatinine 0.65  Labs (1/25): K 4.1, creatinine 0.95  PMH: 1. Chronic systolic CHF: Nonischemic cardiomyopathy.  - LHC/RHC (2/18) with no significant coronary disease. Mean RA 3, mean PCWP 20, CI 2.88.  - Echo (1/18) with EF 20-25%, moderate-severe MR.   - CPX (4/18): peak VO2 14.1, VE/VCO2 slope 59, RER 0.96 (submaximal).  - Echo (4/18): EF 20-25%.  - [employer] subcutaneous ICD - Echo (2/19): EF 35-40%, mild MR.  - Echo (3/23): EF 35-40%, LV function moderately decreased with LV global HK and grade II DD, normal RV. - Echo (12/24): EF 20-25%, moderate LV dilation, mildly decreased RV systolic function, moderate MR.  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  3. H/o Still's disease.   SH: Lives  with Mom. No alcohol or drug abuse. Does not smoke cigarettes  FH: No history of CAD or cardiomyopathy.     Review of systems complete and found to be negative unless listed in HPI.    Current Outpatient Medications  Medication Sig Dispense Refill   buprenorphine-naloxone (SUBOXONE) 8-2 mg SUBL SL tablet Place 1 tablet under the tongue 2 (two) times daily as needed. 60 tablet 0   carvedilol (COREG) 3.125 MG tablet Take 1 tablet (3.125 mg total) by mouth 2  (two) times daily with a meal. 180 tablet 1   dapagliflozin propanediol (FARXIGA) 10 MG TABS tablet Take 1 tablet (10 mg total) by mouth daily. 28 tablet    potassium chloride SA (KLOR-CON M) 20 MEQ tablet Take 1 tablet (20 mEq total) by mouth daily. 30 tablet 3   sacubitril-valsartan (ENTRESTO) 49-51 MG Take 1 tablet by mouth 2 (two) times daily. 28 tablet 0   spironolactone (ALDACTONE) 25 MG tablet Take 1 tablet (25 mg total) by mouth daily. 90 tablet 1   torsemide (DEMADEX) 20 MG tablet Take 2 tablets (40 mg total) by mouth daily. 60 tablet 3   PARoxetine (PAXIL) 20 MG tablet Take 1 tablet (20 mg total) by mouth daily. 30 tablet 3   traZODone (DESYREL) 100 MG tablet Take 1-2 tablets (100-200 mg total) by mouth at bedtime as needed. 60 tablet 3   No current facility-administered medications for this encounter.   BP (!) 90/58   Pulse 79   Wt 55.1 kg (121 lb 6.4 oz)   SpO2 95%   BMI 23.71 kg/m   Wt Readings from Last 3 Encounters:  06/01/23 55.1 kg (121 lb 6.4 oz)  05/01/23 55.8 kg (123 lb)  04/09/23 56.8 kg (125 lb 3.2 oz)   PHYSICAL EXAM: General: NAD Neck: No JVD, no thyromegaly or thyroid nodule.  Lungs: Clear to auscultation bilaterally with normal respiratory effort. CV: Nondisplaced PMI.  Heart regular S1/S2, no S3/S4, no murmur.  No peripheral edema.  No carotid bruit.  Normal pedal pulses.  Abdomen: Soft, nontender, no hepatosplenomegaly, no distention.  Skin: Intact without lesions or rashes.  Neurologic: Alert and oriented x 3.  Psych: Normal affect. Extremities: No clubbing or cyanosis.  HEENT: Normal.   ASSESSMENT & PLAN: 1. Chronic Systolic CHF: Nonischemic cardiomyopathy,  LHC 05/2016 no coronary disease.  ?Viral myocarditis as trigger.  [employer] subcutaneous ICD.  No family history of cardiomyopathy. Echo in 2/19 with EF 35-40%, mild MR. Echo 3/23 with EF stable at 35-40%. Echo 12/24 with EF 20-25%, mod MR, RV mildly reduced (had been out of heart failure  medications for several months).  Now back on meds, NYHA class II.  She is not volume overloaded on exam.  - Continue torsemide 40 mg daily.  BMET/BNP today.  - Continue spironolactone 25 mg daily  - Continue Entresto 49/51 mg bid. BP too soft for titration currently  - Continue Farxiga 10 mg daily. - Continue Coreg 3.125 mg bid. BP too soft for titration.  - I will arrange for cardiac MRI to look for infiltrative disease, her ICD is compatible with MRI.  2. Mitral regurgitation:  Mild on Echo 3/23. Mod on echo 12/24. Likely functional  - HF optimization per above  Followup 1 month with APP.   I spent 32 minutes reviewing records, interviewing/examining patient, and managing orders.    Marca Ancona, MD  06/03/2023

## 2023-06-04 ENCOUNTER — Other Ambulatory Visit (HOSPITAL_COMMUNITY): Payer: Self-pay

## 2023-06-05 ENCOUNTER — Other Ambulatory Visit (HOSPITAL_COMMUNITY): Payer: Self-pay

## 2023-06-06 ENCOUNTER — Other Ambulatory Visit: Payer: Self-pay

## 2023-06-14 ENCOUNTER — Other Ambulatory Visit (HOSPITAL_COMMUNITY): Payer: Self-pay

## 2023-06-26 ENCOUNTER — Other Ambulatory Visit (HOSPITAL_COMMUNITY): Payer: Self-pay

## 2023-06-27 NOTE — Progress Notes (Signed)
 [facility] Note    PCP: Marcine Matar, MD Primary Cardiologist: Dr. Jens Som  HF Cardiologist: Dr Shirlee Latch   Reason for Visit/CC: CHF  HPI:  Kathy Rosales is a 48 y.o. female with a past medical history of NICM EF 25-30% (cath in 05/2016 with normal cors), moderate mitral regurgitation, and tricuspid regurgitation. No FH of coronary disease.   Diagnosed with CHF and  went to her PCP in January 2018 for dyspnea. Says she started getting SOB in the fall. Denies any type of viral illness. No family history of heart disease. Echo was performed and showed reduced EF 20-25% with mod-severe MR. at 25-30%. She was referred to Dr. Jens Som who then set her up for a left and right heart cath. Admitted over night on 2/19 after cath due to changes in LOC after cath, was hypotensive. Symptoms resolved, head CT negative. Optimization of her medications was prohibited by hypotension prior to discharge. Started on corlanor.   Had CPX 07/15/16. Results as below. Immediately after, pt sat in chair and then had a pseudoseizure. No tonic/clonic activity. Pt taken to ED with Neurology consulted. Pt recovered spontaneously recovered. CT angio chest and CT head unremarkable.  Repeat echo was done in 4/18.  EF remained 20-25% with moderate diastolic dysfunction.   She is s/p [employer] ICD implantation 09/27/16. Admission complicated by AMS thought to be psychogenic. CT head, CTA head/neck and EEG were all negative.  Echo in 2/19 showed EF 35-40%.    Lost to follow up. Last seen 05/2018.   She followed up with EP 3/23 and endorsed new DOE. Echo arranged, showing EF 35-40%, LV function moderately decreased with LV global HK and grade II DD, normal RV. Entresto increased.  She was unfortunately lost to f/u in the [facility] and not seen since 11/23 and had ran out of meds.   Admitted 12/24 for a/c CHF in the setting of med noncompliance. Had admitted to running out of HF  meds. She was admitted by internal medicine. Repeat Echo showed EF 20-25%, mod MR, RV mildly reduced. She was diuresed w/ IV Lasix and placed back on oral GDMT.   Today she returns for HF follow up with interpretor. Overall feeling fine. No SOB walking up steps or with work duties, she works Education officer, environmental houses. Denies palpitations, abnormal bleeding, CP, dizziness, edema, or PND/Orthopnea. Appetite ok. No fever or chills. Weight at home 120-122 pounds. Taking all medications.   ReDs reading: 31 %, normal  ECG (personally reviewed): none ordered today.  Labs (12/24): K 3.6, creatinine 0.65  Labs (1/25): K 4.1, creatinine 0.95 Labs (2/25): K 3.7, creatinine 0.66  PMH: 1. Chronic systolic CHF: Nonischemic cardiomyopathy.  - LHC/RHC (2/18) with no significant coronary disease. Mean RA 3, mean PCWP 20, CI 2.88.  - Echo (1/18) with EF 20-25%, moderate-severe MR.   - CPX (4/18): peak VO2 14.1, VE/VCO2 slope 59, RER 0.96 (submaximal).  - Echo (4/18): EF 20-25%.  - [employer] subcutaneous ICD - Echo (2/19): EF 35-40%, mild MR.  - Echo (3/23): EF 35-40%, LV function moderately decreased with LV global HK and grade II DD, normal RV. - Echo (12/24): EF 20-25%, moderate LV dilation, mildly decreased RV systolic function, moderate MR.  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  3. H/o Still's disease.   SH: Lives with Mom. No alcohol or drug abuse. Does not smoke cigarettes  FH: No history of CAD or cardiomyopathy.     Review of systems complete and  found to be negative unless listed in HPI.    Current Outpatient Medications  Medication Sig Dispense Refill   buprenorphine-naloxone (SUBOXONE) 8-2 mg SUBL SL tablet Place 1 tablet under the tongue 2 (two) times daily as needed. 60 tablet 0   carvedilol (COREG) 3.125 MG tablet Take 1 tablet (3.125 mg total) by mouth 2 (two) times daily with a meal. 180 tablet 1   dapagliflozin propanediol (FARXIGA) 10 MG TABS tablet Take 1 tablet (10 mg total) by mouth daily.  28 tablet    PARoxetine (PAXIL) 20 MG tablet Take 1 tablet (20 mg total) by mouth daily. 30 tablet 3   potassium chloride SA (KLOR-CON M) 20 MEQ tablet Take 1 tablet (20 mEq total) by mouth daily. 30 tablet 3   sacubitril-valsartan (ENTRESTO) 49-51 MG Take 1 tablet by mouth 2 (two) times daily. 28 tablet 0   spironolactone (ALDACTONE) 25 MG tablet Take 1 tablet (25 mg total) by mouth daily. 90 tablet 1   torsemide (DEMADEX) 20 MG tablet Take 2 tablets (40 mg total) by mouth daily. 60 tablet 3   traZODone (DESYREL) 100 MG tablet Take 1-2 tablets (100-200 mg total) by mouth at bedtime as needed. 60 tablet 3   No current facility-administered medications for this encounter.   BP 116/74   Pulse 84   Ht 5' (1.524 m)   Wt 55.7 kg (122 lb 12.8 oz)   SpO2 96%   BMI 23.98 kg/m   Wt Readings from Last 3 Encounters:  06/29/23 55.7 kg (122 lb 12.8 oz)  06/01/23 55.1 kg (121 lb 6.4 oz)  05/01/23 55.8 kg (123 lb)   PHYSICAL EXAM: General:  NAD. No resp difficulty, walked into clinic HEENT: Normal Neck: Supple. No JVD. Cor: Regular rate & rhythm. No rubs, gallops or murmurs. Lungs: Clear Abdomen: Soft, nontender, nondistended.  Extremities: No cyanosis, clubbing, rash, edema Neuro: Alert & oriented x 3, moves all 4 extremities w/o difficulty. Affect pleasant.  ASSESSMENT & PLAN: 1. Chronic Systolic CHF: Nonischemic cardiomyopathy,  LHC 05/2016 no coronary disease.  ?Viral myocarditis as trigger.  [employer] subcutaneous ICD.  No family history of cardiomyopathy. Echo in 2/19 with EF 35-40%, mild MR. Echo 3/23 with EF stable at 35-40%. Echo 12/24 with EF 20-25%, mod MR, RV mildly reduced (had been out of heart failure medications for several months).  Now back on meds, NYHA class I-II.  She is not volume overloaded on exam, ReDs 31% - Continue torsemide 40 mg daily.  BMET/BNP today.  - Continue spironolactone 25 mg daily  - Continue Entresto 49/51 mg bid. BP too soft for titration today.   - Stop Marcelline Deist, switch to Jardiance 10 mg daily (insurance preference) - Continue Coreg 3.125 mg bid. BP too soft for titration.  - Cardiac MRI has been arranged and schedule for 07/19/23 to look for infiltrative disease, her ICD is compatible with MRI.  2. Mitral regurgitation:  Mild on Echo 3/23. Mod on echo 12/24. Likely functional  - HF optimization per above  Follow up in 3-4 months with Dr. Park Liter, FNP  06/29/2023

## 2023-06-28 ENCOUNTER — Telehealth (HOSPITAL_COMMUNITY): Payer: Self-pay

## 2023-06-28 NOTE — Telephone Encounter (Signed)
 Called to confirm/remind patient of their appointment at the [facility] on 06/29/23.   Appointment:   [] Confirmed  [x] Left mess   [] No answer/No voice mail  [] Phone not in service  And to bring in all medications and/or complete list.

## 2023-06-29 ENCOUNTER — Ambulatory Visit (HOSPITAL_COMMUNITY)
Admission: RE | Admit: 2023-06-29 | Discharge: 2023-06-29 | Disposition: A | Discharge status: Home / Self Care | Payer: Self-pay | Source: Ambulatory Visit | Attending: Family Medicine | Admitting: Family Medicine

## 2023-06-29 ENCOUNTER — Encounter: Payer: Self-pay | Admitting: Hematology

## 2023-06-29 ENCOUNTER — Encounter (HOSPITAL_COMMUNITY): Payer: Self-pay

## 2023-06-29 ENCOUNTER — Other Ambulatory Visit (HOSPITAL_COMMUNITY): Payer: Self-pay

## 2023-06-29 ENCOUNTER — Other Ambulatory Visit: Payer: Self-pay

## 2023-06-29 VITALS — BP 116/74 | HR 84 | Ht 60.0 in | Wt 122.8 lb

## 2023-06-29 DIAGNOSIS — I081 Rheumatic disorders of both mitral and tricuspid valves: Secondary | ICD-10-CM | POA: Insufficient documentation

## 2023-06-29 DIAGNOSIS — I428 Other cardiomyopathies: Secondary | ICD-10-CM | POA: Insufficient documentation

## 2023-06-29 DIAGNOSIS — Z79899 Other long term (current) drug therapy: Secondary | ICD-10-CM | POA: Insufficient documentation

## 2023-06-29 DIAGNOSIS — Z95810 Presence of automatic (implantable) cardiac defibrillator: Secondary | ICD-10-CM | POA: Insufficient documentation

## 2023-06-29 DIAGNOSIS — I340 Nonrheumatic mitral (valve) insufficiency: Secondary | ICD-10-CM

## 2023-06-29 DIAGNOSIS — I5022 Chronic systolic (congestive) heart failure: Secondary | ICD-10-CM | POA: Insufficient documentation

## 2023-06-29 DIAGNOSIS — Z7984 Long term (current) use of oral hypoglycemic drugs: Secondary | ICD-10-CM | POA: Insufficient documentation

## 2023-06-29 MED ORDER — TORSEMIDE 20 MG PO TABS
40.0000 mg | ORAL_TABLET | Freq: Every day | ORAL | 3 refills | Status: AC
  Filled 2023-06-29: qty 60, 30d supply, fill #0
  Filled 2023-08-15: qty 60, 30d supply, fill #1
  Filled 2023-11-01: qty 60, 30d supply, fill #2

## 2023-06-29 MED ORDER — EMPAGLIFLOZIN 10 MG PO TABS: 10.0000 mg | ORAL_TABLET | Freq: Every day | ORAL | Status: DC

## 2023-06-29 NOTE — Progress Notes (Signed)
 Medication Samples have been provided to the patient.  Drug name: jardiance       Strength: 10 mg        Qty: 2  LOT: [id number]  Exp.Date: 12/26  Dosing instructions: 1 tab daily  The patient has been instructed regarding the correct time, dose, and frequency of taking this medication, including desired effects and most common side effects.   Kathy Rosales 11:03 AM 06/29/2023

## 2023-06-29 NOTE — Patient Instructions (Addendum)
  Qu gusto verlo hoy!  Cambios de medicacin:  STOP Farxiga  COMIENZA Jardiance 10 mg al [employer]. Le hemos proporcionado muestras y ha sido aprobado para recibirlo gratis de la compaa. Llmenos al [phone] para solicitar el envo.  **Por favor, envenos su comprobante de ingresos a [employer] para la asistencia al paciente con [place].  Instrucciones especiales // Educacin:  Haga lo siguiente TODOS LOS DAS: 1) Psese por la maana antes del desayuno. Antelo y Dispensing optician. 2) Tome sus medicamentos segn lo recetado. 3) Consuma alimentos bajos en sal: limite la sal (sodio) a 2000 mg al da. 4) Mantngase lo ms [place]. 5) Limite todos los lquidos del da a menos de 2 litros.  Seguimiento en: 4 meses (julio). **LLAME A NUESTRA OFICINA EN MAYO PARA PROGRAMAR ESTA CITA.   En la [facility] [place], usted y sus necesidades de salud son [place] prioridad. Contamos con un equipo especializado en el tratamiento de la insuficiencia cardaca. Este equipo incluye a su cardilogo especialista en insuficiencia cardaca (mdico de cabecera), profesionales de Cabin crew (APP: auxiliares mdicos y Clinical biochemist) y un farmacutico, quienes trabajan en conjunto para brindarle la atencin que necesita, cuando la necesita.  En su prxima cita de seguimiento, podr ver a cualquiera de los siguientes profesionales en su equipo de atencin designado:   Dr. Arvilla Meres  Dr. Marca Ancona  Dr. Dorthula Nettles  Dr. Theresia Bough  Amy Clegg, enfermera practicante  Robbie Lis, asistente mdica  [place], enfermera practicante  Anna Genre, asistente mdica  Brynda Peon, enfermera practicante  [place] Lee, enfermera practicante  Karle Plumber, doctora en farmacia  Por favor, asegrese de traer todos sus medicamentos a cada cita.  Necesita contactarnos:  Si tiene [place] pregunta o inquietud antes de su prxima  cita, envenos un mensaje a travs de mychart o llame a nuestra oficina al [phone].  PARA DEJAR UN MENSAJE PARA LA ENFERMERA, SELECCIONE LA OPCIN 2. DEJE UN MENSAJE QUE INCLUYA:  SU NOMBRE  FECHA DE NACIMIENTO  NMERO DE LLAMADA  MOTIVO DE LA LLAMADA **Esto es importante, ya que priorizamos las devoluciones de [address]  LE DEVOLVEREMOS LA LLAMADA EL MISMO DA, SIEMPRE QUE LLAME ANTES DE LAS 4:00 PM.

## 2023-06-29 NOTE — Progress Notes (Signed)
 ReDS Vest / Clip - 06/29/23 1000       ReDS Vest / Clip   Station Marker A    Ruler Value 24    ReDS Value Range Low volume    ReDS Actual Value 31

## 2023-07-02 ENCOUNTER — Other Ambulatory Visit (HOSPITAL_COMMUNITY): Payer: Self-pay

## 2023-07-02 ENCOUNTER — Encounter: Payer: Self-pay | Admitting: Hematology

## 2023-07-02 ENCOUNTER — Ambulatory Visit (INDEPENDENT_AMBULATORY_CARE_PROVIDER_SITE_OTHER): Payer: Self-pay

## 2023-07-02 DIAGNOSIS — I428 Other cardiomyopathies: Secondary | ICD-10-CM

## 2023-07-02 DIAGNOSIS — I5022 Chronic systolic (congestive) heart failure: Secondary | ICD-10-CM

## 2023-07-02 MED ORDER — BUPRENORPHINE HCL-NALOXONE HCL 8-2 MG SL SUBL
1.0000 | SUBLINGUAL_TABLET | Freq: Two times a day (BID) | SUBLINGUAL | 0 refills | Status: DC | PRN
  Filled 2023-07-02: qty 60, 30d supply, fill #0

## 2023-07-03 ENCOUNTER — Other Ambulatory Visit (HOSPITAL_COMMUNITY): Payer: Self-pay

## 2023-07-04 LAB — CUP PACEART REMOTE DEVICE CHECK
Battery Remaining Percentage: 23 %
Date Time Interrogation Session: 20250331205000
HighPow Impedance: 75 Ohm
Implantable Lead Connection Status: 753985
Implantable Lead Implant Date: 20180627
Implantable Lead Location: 753862
Implantable Lead Model: 3401
Implantable Lead Serial Number: 113145
Implantable Pulse Generator Implant Date: 20180627
Pulse Gen Serial Number: 222625

## 2023-07-18 ENCOUNTER — Encounter: Payer: Self-pay | Admitting: Hematology

## 2023-07-18 ENCOUNTER — Other Ambulatory Visit (HOSPITAL_COMMUNITY): Payer: Self-pay

## 2023-07-19 ENCOUNTER — Ambulatory Visit (HOSPITAL_COMMUNITY): Admission: RE | Admit: 2023-07-19 | Payer: Self-pay | Source: Ambulatory Visit

## 2023-08-03 ENCOUNTER — Other Ambulatory Visit: Payer: Self-pay

## 2023-08-03 ENCOUNTER — Ambulatory Visit: Payer: Self-pay | Admitting: Internal Medicine

## 2023-08-03 ENCOUNTER — Encounter: Payer: Self-pay | Admitting: Hematology

## 2023-08-03 ENCOUNTER — Other Ambulatory Visit (HOSPITAL_COMMUNITY): Payer: Self-pay

## 2023-08-03 MED ORDER — AMOXICILLIN 500 MG PO CAPS
500.0000 mg | ORAL_CAPSULE | Freq: Three times a day (TID) | ORAL | 0 refills | Status: AC
  Filled 2023-08-03: qty 21, 7d supply, fill #0

## 2023-08-03 MED ORDER — CHLORHEXIDINE GLUCONATE 0.12 % MT SOLN
Freq: Two times a day (BID) | OROMUCOSAL | 0 refills | Status: AC
Start: 2023-08-03 — End: ?
  Filled 2023-08-03: qty 473, 16d supply, fill #0

## 2023-08-03 MED ORDER — IBUPROFEN 800 MG PO TABS
800.0000 mg | ORAL_TABLET | Freq: Three times a day (TID) | ORAL | 0 refills | Status: AC | PRN
  Filled 2023-08-03: qty 20, 7d supply, fill #0

## 2023-08-05 ENCOUNTER — Other Ambulatory Visit (HOSPITAL_COMMUNITY): Payer: Self-pay

## 2023-08-05 MED ORDER — BUPRENORPHINE HCL-NALOXONE HCL 8-2 MG SL SUBL
1.0000 | SUBLINGUAL_TABLET | Freq: Two times a day (BID) | SUBLINGUAL | 0 refills | Status: AC | PRN
  Filled 2023-09-04 (×2): qty 60, 30d supply, fill #0

## 2023-08-06 ENCOUNTER — Other Ambulatory Visit (HOSPITAL_COMMUNITY): Payer: Self-pay

## 2023-08-09 ENCOUNTER — Other Ambulatory Visit (HOSPITAL_COMMUNITY): Payer: Self-pay

## 2023-08-09 ENCOUNTER — Encounter: Payer: Self-pay | Admitting: Hematology

## 2023-08-09 MED ORDER — GABAPENTIN 300 MG PO CAPS
300.0000 mg | ORAL_CAPSULE | Freq: Three times a day (TID) | ORAL | 1 refills | Status: AC | PRN
  Filled 2023-08-09 (×2): qty 90, 30d supply, fill #0
  Filled 2023-12-24 – 2024-04-10 (×2): qty 90, 30d supply, fill #1

## 2023-08-09 MED ORDER — BUPRENORPHINE HCL-NALOXONE HCL 8-2 MG SL SUBL
1.0000 | SUBLINGUAL_TABLET | Freq: Two times a day (BID) | SUBLINGUAL | 0 refills | Status: AC
  Filled 2023-08-09: qty 60, 30d supply, fill #0

## 2023-08-10 ENCOUNTER — Other Ambulatory Visit: Payer: Self-pay

## 2023-08-15 ENCOUNTER — Encounter: Payer: Self-pay | Admitting: Hematology

## 2023-08-15 ENCOUNTER — Other Ambulatory Visit: Payer: Self-pay

## 2023-08-16 NOTE — Progress Notes (Signed)
 Remote ICD transmission.

## 2023-08-16 NOTE — Addendum Note (Signed)
 Addended by: Edra Govern D on: 08/16/2023 12:08 PM   Modules accepted: Orders

## 2023-08-23 ENCOUNTER — Other Ambulatory Visit: Payer: Self-pay

## 2023-08-23 ENCOUNTER — Encounter: Payer: Self-pay | Admitting: Hematology

## 2023-09-04 ENCOUNTER — Encounter: Payer: Self-pay | Admitting: Hematology

## 2023-09-04 ENCOUNTER — Other Ambulatory Visit (HOSPITAL_COMMUNITY): Payer: Self-pay

## 2023-09-05 ENCOUNTER — Other Ambulatory Visit (HOSPITAL_COMMUNITY): Payer: Self-pay

## 2023-09-05 ENCOUNTER — Other Ambulatory Visit: Payer: Self-pay

## 2023-09-06 ENCOUNTER — Other Ambulatory Visit (HOSPITAL_COMMUNITY): Payer: Self-pay

## 2023-09-18 ENCOUNTER — Encounter: Payer: Self-pay | Admitting: Hematology

## 2023-09-18 ENCOUNTER — Other Ambulatory Visit: Payer: Self-pay

## 2023-10-01 ENCOUNTER — Ambulatory Visit: Payer: Self-pay

## 2023-10-01 DIAGNOSIS — I5022 Chronic systolic (congestive) heart failure: Secondary | ICD-10-CM

## 2023-10-02 ENCOUNTER — Ambulatory Visit: Payer: Self-pay | Admitting: Internal Medicine

## 2023-10-03 LAB — CUP PACEART REMOTE DEVICE CHECK
Battery Remaining Percentage: 20 %
Battery Voltage: 20
Date Time Interrogation Session: 20250701102800
HighPow Impedance: 75 Ohm
Implantable Lead Connection Status: 753985
Implantable Lead Implant Date: 20180627
Implantable Lead Location: 753862
Implantable Lead Model: 3401
Implantable Lead Serial Number: 113145
Implantable Pulse Generator Implant Date: 20180627
Pulse Gen Serial Number: 222625

## 2023-10-04 NOTE — CV Procedure (Signed)
  Device system confirmed to be MRI conditional, with implant date > 6 weeks ago, and no evidence of abandoned or epicardial leads in review of most recent CXR  Device last cleared by EP Provider:  Daphne Barrack 10/04/23  Clearance is good through for 1 year as long as parameters remain stable at time of check. If pt undergoes a cardiac device procedure during that time, they should be re-cleared.   Tachy-therapies to be programmed off if applicable with device back to pre-MRI settings after completion of exam.  [employer] was available remotely to assist in programming recommendations.   Izetta CHRISTELLA Linen, RT  10/04/2023 1:27 PM

## 2023-10-10 ENCOUNTER — Other Ambulatory Visit (HOSPITAL_COMMUNITY): Payer: Self-pay | Admitting: Psychiatry

## 2023-10-10 ENCOUNTER — Ambulatory Visit (HOSPITAL_COMMUNITY): Admission: RE | Admit: 2023-10-10 | Payer: Self-pay | Source: Ambulatory Visit

## 2023-10-10 ENCOUNTER — Ambulatory Visit: Payer: Self-pay | Admitting: Internal Medicine

## 2023-10-10 ENCOUNTER — Ambulatory Visit (HOSPITAL_COMMUNITY)
Admission: RE | Admit: 2023-10-10 | Discharge: 2023-10-10 | Disposition: A | Discharge status: Home / Self Care | Payer: Self-pay | Source: Ambulatory Visit | Attending: Cardiology | Admitting: Cardiology

## 2023-10-10 ENCOUNTER — Other Ambulatory Visit (HOSPITAL_COMMUNITY): Payer: Self-pay

## 2023-10-10 DIAGNOSIS — F3342 Major depressive disorder, recurrent, in full remission: Secondary | ICD-10-CM

## 2023-10-11 ENCOUNTER — Encounter: Payer: Self-pay | Admitting: Hematology

## 2023-10-11 ENCOUNTER — Other Ambulatory Visit (HOSPITAL_COMMUNITY): Payer: Self-pay

## 2023-10-11 MED ORDER — BUPRENORPHINE HCL-NALOXONE HCL 8-2 MG SL SUBL
1.0000 | SUBLINGUAL_TABLET | Freq: Two times a day (BID) | SUBLINGUAL | 0 refills | Status: DC
  Filled 2023-10-11: qty 60, 30d supply, fill #0

## 2023-10-12 ENCOUNTER — Other Ambulatory Visit (HOSPITAL_COMMUNITY): Payer: Self-pay

## 2023-10-17 ENCOUNTER — Other Ambulatory Visit (HOSPITAL_COMMUNITY): Payer: Self-pay

## 2023-10-19 ENCOUNTER — Other Ambulatory Visit (HOSPITAL_COMMUNITY): Payer: Self-pay | Admitting: Psychiatry

## 2023-10-19 ENCOUNTER — Other Ambulatory Visit (HOSPITAL_COMMUNITY): Payer: Self-pay

## 2023-10-19 DIAGNOSIS — F3342 Major depressive disorder, recurrent, in full remission: Secondary | ICD-10-CM

## 2023-10-23 ENCOUNTER — Other Ambulatory Visit (HOSPITAL_COMMUNITY): Payer: Self-pay

## 2023-10-29 ENCOUNTER — Other Ambulatory Visit (HOSPITAL_COMMUNITY): Payer: Self-pay

## 2023-10-30 NOTE — Procedures (Signed)
Result scanned to media

## 2023-11-01 ENCOUNTER — Other Ambulatory Visit (HOSPITAL_COMMUNITY): Payer: Self-pay

## 2023-11-01 ENCOUNTER — Telehealth (HOSPITAL_COMMUNITY): Payer: Self-pay | Admitting: Psychiatry

## 2023-11-01 NOTE — Telephone Encounter (Signed)
 Patient stopped by the office and was asking if she could be seen. We have gone through the walk ins for the day and patient asked how she could get a refill as she is out of medication (Paxil  and Trazadone). Patient understands that she will need to have an appointment to get refills. She has agreed to come in as a walk in, but needs medication to hold her over. I offered to make her an appointment, but she would like to only do walk in at this time. Patient said she may have a hard time filling out the clipboard. I told her the important thing was to get the clipboard and fill out as much as she could and we could help her with it when she was called up in the morning. I have verified her address and phone number.  Please advise.  Thank you.

## 2023-11-01 NOTE — Telephone Encounter (Signed)
 Sending this to [facility],   In this particular case I'm not sure how to go about this Pt was seen last August by provider, I'd hate to bother sending this over to any of our providers for a refill with the policies we have in place. Is there any way that you could advise ?

## 2023-11-02 ENCOUNTER — Ambulatory Visit (HOSPITAL_COMMUNITY): Admitting: Licensed Clinical Social Worker

## 2023-11-02 DIAGNOSIS — F321 Major depressive disorder, single episode, moderate: Secondary | ICD-10-CM

## 2023-11-02 DIAGNOSIS — F329 Major depressive disorder, single episode, unspecified: Secondary | ICD-10-CM | POA: Diagnosis not present

## 2023-11-02 NOTE — Progress Notes (Signed)
 Comprehensive Clinical Assessment (CCA) Note  11/02/2023 Kathy Rosales Ante [id number]  Chief Complaint:  Chief Complaint  Patient presents with   Anxiety   Depression   Visit Diagnosis: MDD     Virtual Visit via Video Note  I connected with Regina Ganci Cody on 11/02/23 at  2:00 PM EDT by a video enabled telemedicine application and verified that I am speaking with the correct person using two identifiers.  Location: Patient: [name]  Provider: Providers Home    I discussed the limitations of evaluation and management by telemedicine and the availability of in person appointments. The patient expressed understanding and agreed to proceed.   Client is a 48 year old female female/female. Client is referred by self for a depression .   Client states mental health symptoms as evidenced by:   Depression Change in energy/activity; Fatigue; Sleep (too much or little) Change in energy/activity; Fatigue; Sleep (too much or little)  Duration of Depressive Symptoms Greater than two weeks Greater than two weeks  Mania None None  Anxiety Fatigue; Difficulty concentrating; Sleep; Worrying; Tension Fatigue; Difficulty concentrating; Sleep; Worrying; Tension  Psychosis None None  Trauma None None  Obsessions None None  Compulsions None None  Inattention Forgetful Forgetful  Hyperactivity/Impulsivity N/A N/A  Oppositional/Defiant Behaviors N/A N/A  Other Mood/Personality Symptoms Depression with anxiety that was well managed prior to going off meds Depression with anxiety that was well managed prior to going off meds   Client denies suicidal and homicidal ideations at this time    Client denies hallucinations and delusions at this time  Client was screened for the following SDOH: PHQ-9   Assessment Information that integrates subjective and objective details with a therapist's professional interpretation:   Kathy Rosales was alert and oriented x 5.  She was  pleasant, cooperative, maintained good eye contact.  She engaged well in comprehensive clinical assessment and was dressed casually.  Kathy Rosales presented with anxious and depressed mood\affect.  Patient comes in today wanting to reestablish care with [facility].  She has history of delirium (in 2017 per chart review), depression, and anxiety.  Kathy Rosales reports that when she is taking her medications her symptoms are very well managed.  Kathy Rosales was taking Paxil  and trazodone  until Tuesday, July 28.  Patient ran out of her Paxil  and trazodone  then called for a refill but could not get a refill due to not being seen within the last 12 months.  Patient would like to reestablish care.  Patient declined therapy services.  LCSW provided patient with walk-in hours for [facility].  Patient denies any suicidal or homicidal ideations.  Patient denies any auditory or visual hallucinations.  Plan for patient is to follow up Monday morning for walk-in hours at [facility] starting at 8 AM.  LCSW recommended to patient to be here between 715 and 7:30 AM as it is a first come first third basis.    Client states use of the following substances: none reported    Treatment recommendations are include plan: Referral to medication mgnt team at [facility] for Paxil  and Trazodone .  Walk in hour provided to pt verbally in session.     Client was in agreement with treatment recommendations.      I discussed the assessment and treatment plan with the patient. The patient was provided an opportunity to ask questions and all were answered. The patient agreed with the plan and demonstrated an understanding of  the instructions.   The patient was advised to call back or seek an in-person evaluation if the symptoms worsen or if the condition fails to improve as anticipated.  I provided 35 minutes of non-face-to-face time during this  encounter.   Kathy GORMAN Patee, LCSW   CCA Screening, Triage and Referral (STR)  Patient Reported Information How did you hear about us ? Family/Friend  Referral name: Pt wants to [facility] care wth [facility]  Whom do you see for routine medical problems? Primary Care  Practice/Facility Name: [name]    What Is the Reason for Your Visit/Call Today? No data recorded How Long Has This Been Causing You Problems? 1 wk - 1 month  What Do You Feel Would Help You the Most Today? Medication(s)   Have You Recently Been in Any Inpatient Treatment (Hospital/Detox/Crisis Center/28-Day Program)? No   Have You Ever Received Services From [facility] Before? Yes  Who Do You See at [facility]? multiple services   Have You Recently Had Any Thoughts About Hurting Yourself? No  Are You Planning to Commit Suicide/Harm Yourself At This time? No   Have you Recently Had Thoughts About Hurting Someone [name]? No  Explanation: No data recorded  Have You Used Any Alcohol or Drugs in the Past 24 Hours? No  How Long Ago Did You Use Drugs or Alcohol? No data recorded What Did You Use and How Much? No data recorded  Do You Currently Have a Therapist/Psychiatrist? No  Name of Therapist/Psychiatrist: No data recorded  Have You Been Recently Discharged From Any Office Practice or Programs? No  Explanation of Discharge From Practice/Program: No data recorded    CCA Screening Triage Referral Assessment Type of Contact: Tele-Assessment  Is this Initial or Reassessment? Reassessment  Is CPS involved or ever been involved? Never  Is APS involved or ever been involved? Never   Patient Determined To Be At Risk for Harm To Self or Others Based on Review of Patient Reported Information or Presenting Complaint? No  Method: No Plan  Availability of Means: No access or NA  Intent: Vague intent or NA  Notification Required: No need or identified person   Location of  Assessment: Other (comment) (Pt home)    [place] of Residence: [place]   Patient Currently Receiving the Following Services: No data recorded  Determination of Need: No data recorded  Options For Referral: Medication Management     CCA Biopsychosocial Intake/Chief Complaint:  Depression/Anxiety  Current Symptoms/Problems: Hx of depression, anxiety, and derlium per chart review. Pt reports was taking paxil  and trazadone but ran out 3 days ago and pt has not been seen by provider in 1 year so needs to restblish care to get refills   Patient Reported Schizophrenia/Schizoaffective Diagnosis in Past: No   Strengths: Help seeking  Preferences: Call her Velna  Abilities: No data recorded  Type of Services Patient Feels are Needed: Comed management   Initial Clinical Notes/Concerns: medication ran out for paxil  and trazadone   Mental Health Symptoms Depression:  Change in energy/activity; Fatigue; Sleep (too much or little)   Duration of Depressive symptoms: Greater than two weeks   Mania:  None   Anxiety:   Fatigue; Difficulty concentrating; Sleep; Worrying; Tension   Psychosis:  None   Duration of Psychotic symptoms: No data recorded  Trauma:  None   Obsessions:  None   Compulsions:  None   Inattention:  Forgetful   Hyperactivity/Impulsivity:  N/A   Oppositional/Defiant Behaviors:  N/A  Emotional Irregularity:  No data recorded  Other Mood/Personality Symptoms:  Depression with anxiety that was well managed prior to going off meds    Mental Status Exam Appearance and self-care  Stature:  Small   Weight:  Average weight   Clothing:  Casual   Grooming:  Normal   Cosmetic use:  Age appropriate   Posture/gait:  Normal   Motor activity:  Not Remarkable   Sensorium  Attention:  Normal   Concentration:  Normal   Orientation:  X5   Recall/memory:  Defective in Remote (Pt reports poor recall in short period of time)   Affect and Mood   Affect:  Anxious   Mood:  Anxious   Relating  Eye contact:  Normal   Facial expression:  Anxious   Attitude toward examiner:  Cooperative   Thought and Language  Speech flow: Soft   Thought content:  Appropriate to Mood and Circumstances   Preoccupation:  None   Hallucinations:  None   Organization:  No data recorded  [employer] of Knowledge:  Fair   Intelligence:  Average   Abstraction:  Normal   Judgement:  Fair   Dance movement psychotherapist:  Adequate   Insight:  Present   Decision Making:  -- (Needs additional assessment)   Social Functioning  Social Maturity:  Isolates   Social Judgement:  Naive   Stress  Stressors:  Other (Comment) (running out of mental health medication)   Coping Ability:  Normal (Needs additional assessment)   Skill Deficits:  Self-care (due to pt not staying estblish with medication provider for mental health medications)   Supports:  Family; Friends/Service system; Church     Religion: Religion/Spirituality Are You A Religious Person?: Yes (States she reads her Bible every night) What is Your Religious Affiliation?: Chiropodist: Leisure / Recreation Do You Have Hobbies?: Yes Leisure and Hobbies: clean house, walking dog  Exercise/Diet: Exercise/Diet Do You Exercise?: Yes What Type of Exercise Do You Do?: Run/Walk (walking dog) How Many Times a Week Do You Exercise?: 6-7 times a week Have You Gained or Lost A Significant Amount of Weight in the Past Six Months?: No Do You Follow a Special Diet?: No Do You Have Any Trouble Sleeping?: Yes Explanation of Sleeping Difficulties: Trouble falling asleep when not taking medication   CCA Employment/Education Employment/Work Situation: Employment / Work Situation Employment Situation: Employed Where is Patient Currently Employed?: 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?: No  Education: Education Is Patient Currently Attending School?: No Last Grade Completed: 12 Did Garment/textile technologist From [employer]?: Yes Did Theme park manager?: No Did Designer, television/film set?: No Did You Have An Individualized Education Program (IIEP): No Did You Have Any Difficulty At School?: No Patient's Education Has Been Impacted by Current Illness: No   CCA Family/Childhood History Family and Relationship History: Family history Marital status: Divorced Divorced, when?: 15 years ago What types of issues is patient dealing with in the relationship?: no contact Are you sexually active?: Yes What is your sexual orientation?: Heterosexual Has your sexual activity been affected by drugs, alcohol,  medication, or emotional stress?: none reported Does patient have children?: Yes How many children?: 1 How is patient's relationship with their children?: Very good, 18 yrs old  Childhood History:  Childhood History By whom was/is the patient raised?: Both parents Additional childhood history information: Pt reports she moved to the [place]  at age 42 Description of patient's relationship with caregiver when they were a child: good Patient's description of current relationship with people who raised him/her: good Does patient have siblings?: Yes Number of Siblings: 1 Description of patient's current relationship with siblings: good with older sister Did patient suffer any verbal/emotional/physical/sexual abuse as a child?: No Did patient suffer from severe childhood neglect?: No Has patient ever been sexually abused/assaulted/raped as an adolescent or adult?: No Was the patient ever a victim of a crime or a  disaster?: No Witnessed domestic violence?: No Has patient been affected by domestic violence as an adult?: No  Child/Adolescent Assessment:     CCA Substance Use Alcohol/Drug Use: Alcohol / Drug Use Pain Medications: See MAR Prescriptions: See MAR Over the Counter: See MAR History of alcohol / drug use?: No history of alcohol / drug abuse                         ASAM's:  Six Dimensions of Multidimensional Assessment  Dimension 1:  Acute Intoxication and/or Withdrawal Potential:      Dimension 2:  Biomedical Conditions and Complications:      Dimension 3:  Emotional, Behavioral, or Cognitive Conditions and Complications:     Dimension 4:  Readiness to Change:     Dimension 5:  Relapse, Continued use, or Continued Problem Potential:     Dimension 6:  Recovery/Living Environment:     ASAM Severity Score:    ASAM Recommended Level of Treatment:     Substance use Disorder (SUD)    Recommendations for Services/Supports/Treatments:    DSM5 Diagnoses: Patient Active Problem List   Diagnosis Date Noted   Iron  deficiency 05/02/2023   Acute HFrEF (heart failure with reduced ejection fraction) (HCC) 03/29/2023   Recurrent major depressive disorder, in full remission (HCC) 06/04/2020   Vitamin D  deficiency 06/18/2019   Migraine without aura and without status migrainosus, not intractable 05/16/2019   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   Rheumatoid arthritis of multiple sites with negative rheumatoid factor (HCC) 11/19/2017   Left-sided weakness    Unresponsive    Chronic systolic heart failure (HCC) 05/22/2016   Mitral regurgitation 05/22/2016   Near syncope 05/22/2016   Delirium due to multiple etiologies, persistent, mixed level of activity 12/13/2015   MDD (major depressive disorder), single episode, moderate (HCC) 12/13/2015   Fatigue 12/01/2015   Helicobacter positive gastritis 08/06/2015   Gastric ulcer 08/06/2015   Iron   deficiency anemia due to chronic blood loss 08/06/2015   Anemia 07/21/2015   Iron  deficiency anemia 10/22/2014   B12 deficiency 10/22/2014   Symptomatic anemia 08/28/2014   Headache 08/28/2014   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      Referrals to Alternative Service(s): Referred to Alternative Service(s):   Place:   Date:   Time:    Referred to Alternative Service(s):   Place:   Date:   Time:    Referred to Alternative Service(s):   Place:   Date:   Time:    Referred to Alternative Service(s):   Place:   Date:   Time:      Collaboration of Care: Other walk in appointment for medication  mgnt provided for pt to reestablish care at [facility]   Patient/Guardian was advised Release of Information must be obtained prior to any record release in order to collaborate their care with an outside provider. Patient/Guardian was advised if they have not already done so to contact the registration department to sign all necessary forms in order for us  to release information regarding their care.   Consent: Patient/Guardian gives verbal consent for treatment and assignment of benefits for services provided during this visit. Patient/Guardian expressed understanding and agreed to proceed.   Zynia Wojtowicz S Gelsey Amyx, LCSW

## 2023-11-03 ENCOUNTER — Encounter: Payer: Self-pay | Admitting: Internal Medicine

## 2023-11-03 DIAGNOSIS — G4733 Obstructive sleep apnea (adult) (pediatric): Secondary | ICD-10-CM | POA: Insufficient documentation

## 2023-11-05 ENCOUNTER — Ambulatory Visit (INDEPENDENT_AMBULATORY_CARE_PROVIDER_SITE_OTHER): Admitting: Physician Assistant

## 2023-11-05 ENCOUNTER — Other Ambulatory Visit: Payer: Self-pay

## 2023-11-05 ENCOUNTER — Encounter: Payer: Self-pay | Admitting: Hematology

## 2023-11-05 ENCOUNTER — Encounter (HOSPITAL_COMMUNITY): Payer: Self-pay | Admitting: Physician Assistant

## 2023-11-05 DIAGNOSIS — F3342 Major depressive disorder, recurrent, in full remission: Secondary | ICD-10-CM

## 2023-11-05 MED ORDER — PAROXETINE HCL 40 MG PO TABS
ORAL_TABLET | ORAL | 1 refills | Status: DC
  Filled 2023-11-05: qty 30, 33d supply, fill #0
  Filled 2023-12-04: qty 30, 30d supply, fill #1

## 2023-11-05 MED ORDER — TRAZODONE HCL 50 MG PO TABS
50.0000 mg | ORAL_TABLET | Freq: Every evening | ORAL | 1 refills | Status: DC | PRN
  Filled 2023-11-05: qty 30, 30d supply, fill #0
  Filled 2023-12-04: qty 30, 30d supply, fill #1

## 2023-11-05 NOTE — Telephone Encounter (Signed)
 Message acknowledged and reviewed. Patient was seen today. Patient's next encounter is scheduled for 12/18/2023.

## 2023-11-05 NOTE — Progress Notes (Addendum)
 Psychiatric Initial Adult Assessment   Patient Identification: Kathy Rosales MRN:  [id number] Date of Evaluation:  11/05/2023 Referral Source: Walk-in Chief Complaint:   Chief Complaint  Patient presents with   Follow-up   Medication Management   Visit Diagnosis:    ICD-10-CM   1. Recurrent major depressive disorder, in full remission (HCC)  F33.42 PARoxetine  (PAXIL ) 40 MG tablet    traZODone  (DESYREL ) 50 MG tablet      History of Present Illness:    Kathy Rosales is a 48 year old female with a past psychiatric history significant for major depressive disorder (recurrent, in full remission) who presents to [facility] to reestablish psychiatric care for medication management.  Patient was last seen by Dr. Harl on 08/31/2022.  During her last encounter, patient was being managed on the following psychiatric medications:  Trazodone  100 mg at bedtime as needed (1 to 2 tablets) Paroxetine  20 mg daily  Patient presents to the encounter requesting refills on her medication.  She reports that she was previously seen by Dr. Harl back in May of last year.  She reports that she was taking the following psychiatric medications:  Trazodone  50 mg at bedtime as needed Paroxetine  40 mg daily  Patient reports no issues with her current medication regimen but states that she is currently ran out of her Paxil  prescription.  Patient denies overt depressive symptoms but states that she feels like she can feel her symptoms resurfacing.  Patient denies anxiety at this time.  Patient denies a past history of hospitalization due to mental health.  Patient denies a past history of suicide attempt.  A PHQ-9 screen was performed with the patient scoring a 0.  A GAD-7 screen was also performed with the patient scoring a 0.  Patient is alert and oriented x 4, pleasant, calm, cooperative, and fully engaged in conversation during the encounter.   Patient endorses very good mood.  Patient exhibits euthymic mood with appropriate affect.  Patient denies suicidal or homicidal ideations.  She further denies auditory or visual hallucinations and does not appear to be responding to internal/external stimuli.  Patient denies paranoia or delusional thoughts.  Patient endorses good sleep and receives on average 8 to 9 hours of sleep per night.  Patient endorses good appetite and eats on average 2 meals per day.  Patient denies alcohol consumption, tobacco use, or illicit drug use.  Associated Signs/Symptoms: Depression Symptoms:  Patient denies (Hypo) Manic Symptoms:  Patient denies Anxiety Symptoms:  Patient denies Psychotic Symptoms:  Patient denies PTSD Symptoms: Negative  Past Psychiatric History:  Patient endorses a past psychiatric history significant for depression and anxiety.  Patient denies a past history of hospitalization due to mental health.  Patient denies a past history of suicide attempt.  Patient denies a past history of homicide attempts.  Previous Psychotropic Medications: Yes , patient reports that she has been on Paxil  40 mg daily and trazodone  50 mg at bedtime  Substance Abuse History in the last 12 months:  No.  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[facility])     Past Surgical History:  Procedure Laterality Date   COLONOSCOPY WITH PROPOFOL  N/A 07/23/2015   Procedure: COLONOSCOPY WITH PROPOFOL ;  Surgeon: Lamar Bunk, MD;  Location: [facility];  Service: Endoscopy;  Laterality: N/A;  ESOPHAGOGASTRODUODENOSCOPY (EGD) WITH PROPOFOL  N/A 07/23/2015   Procedure: ESOPHAGOGASTRODUODENOSCOPY (EGD) WITH PROPOFOL ;  Surgeon: Lamar Bunk, MD;  Location: [facility];  Service: Endoscopy;  Laterality: N/A;    ESOPHAGOGASTRODUODENOSCOPY (EGD) WITH PROPOFOL  N/A 11/18/2015   Procedure: ESOPHAGOGASTRODUODENOSCOPY (EGD) WITH PROPOFOL ;  Surgeon: Lamar Bunk, MD;  Location: [facility];  Service: Endoscopy;  Laterality: N/A;   RIGHT/LEFT HEART CATH AND CORONARY ANGIOGRAPHY N/A 05/22/2016   Procedure: Right/Left Heart Cath and Coronary Angiography;  Surgeon: Peter M Swaziland, MD;  Location: [facility] INVASIVE CV LAB;  Service: Cardiovascular;  Laterality: N/A;   SUBQ ICD IMPLANT N/A 09/27/2016   Procedure: SubQ ICD Implant;  Surgeon: Waddell Danelle ORN, MD;  Location: [facility] INVASIVE CV LAB;  Service: Cardiovascular;  Laterality: N/A;    Family Psychiatric History:  Patient denies a family history of psychiatric illness.  Family history of suicide attempt: Patient denies Family history of homicide attempt: Patient denies Family history of substance abuse: Patient denies  Family History:  Family History  Problem Relation Age of Onset   Cancer Paternal Grandfather    Mental illness Neg Hx     Social History:   Social History   Socioeconomic History   Marital status: Single    Spouse name: Not on file   Number of children: 1   Years of education: Not on file   Highest education level: Not on file  Occupational History   Occupation: unemployeed  Tobacco Use   Smoking status: Never   Smokeless tobacco: Never  Vaping Use   Vaping status: Never Used  Substance and Sexual Activity   Alcohol use: No   Drug use: No   Sexual activity: Not Currently    Birth control/protection: None  Other Topics Concern   Not on file  Social History Narrative   ** Merged History Encounter **       ** Merged History Encounter **       Social Drivers of Corporate investment banker Strain: High Risk (05/04/2023)   Overall Financial Resource Strain (CARDIA)    Difficulty of Paying Living Expenses: Hard  Food Insecurity: No Food Insecurity (03/30/2023)   Hunger Vital Sign    Worried About Running Out of Food in the Last  Year: Never true    Ran Out of Food in the Last Year: Never true  Transportation Needs: No Transportation Needs (03/30/2023)   PRAPARE - Administrator, Civil Service (Medical): No    Lack of Transportation (Non-Medical): No  Physical Activity: Not on file  Stress: Not on file  Social Connections: Not on file    Additional Social History:  Patient endorses social support.  Patient endorses having children.  Patient endorses housing.  Patient is currently unemployed.  Patient denies a past history of military experience.  Patient denies a past history of present or jail time.  Highest education earned by the patient is high school.  Patient denies access to weapons.  Allergies:   Allergies  Allergen Reactions   Tramadol  Itching and Other (See Comments)    Red spots all over body and itching   Furosemide  Rash   Leflunomide Rash    Metabolic Disorder Labs: Lab Results  Component Value Date   HGBA1C 5.1 03/29/2023   MPG 100 03/29/2023   MPG 111.15 07/08/2021   No results found for: PROLACTIN Lab Results  Component Value Date   CHOL 157 03/29/2023   TRIG 94 03/29/2023   HDL 81 03/29/2023   CHOLHDL 1.9 03/29/2023  VLDL 19 03/29/2023   LDLCALC 57 03/29/2023   LDLCALC 73 12/14/2015   Lab Results  Component Value Date   TSH 2.530 06/09/2019    Therapeutic Level Labs: No results found for: LITHIUM No results found for: CBMZ No results found for: VALPROATE  Current Medications: Current Outpatient Medications  Medication Sig Dispense Refill   amoxicillin  (AMOXIL ) 500 MG capsule Take 1 capsule (500 mg total) by mouth every 8 (eight) hours until finished. 21 capsule 0   buprenorphine -naloxone  (SUBOXONE ) 8-2 mg SUBL SL tablet Place 1 tablet under the tongue 2 (two) times daily as needed. 60 tablet 0   buprenorphine -naloxone  (SUBOXONE ) 8-2 mg SUBL SL tablet Place 1 tablet under the tongue 2 (two) times daily as needed. 60 tablet 0   buprenorphine -naloxone   (SUBOXONE ) 8-2 mg SUBL SL tablet Place 1 tablet under the tongue 2 (two) times daily. 60 tablet 0   carvedilol  (COREG ) 3.125 MG tablet Take 1 tablet (3.125 mg total) by mouth 2 (two) times daily with a meal. 180 tablet 1   chlorhexidine  (PERIDEX ) 0.12 % solution Swish for one minute, then spit out, twice daily for 10 days. 473 mL 0   empagliflozin  (JARDIANCE ) 10 MG TABS tablet Take 1 tablet (10 mg total) by mouth daily before breakfast.     gabapentin  (NEURONTIN ) 300 MG capsule Take 1 capsule (300 mg total) by mouth 3 (three) times daily as needed. 90 capsule 1   ibuprofen  (ADVIL ) 800 MG tablet Take 1 tablet (800 mg total) by mouth every 8 (eight) hours as needed for pain. 20 tablet 0   PARoxetine  (PAXIL ) 40 MG tablet Take 0.5 tablets (20 mg total) by mouth daily for 6 days, THEN 1 tablet (40 mg total) daily. 30 tablet 1   potassium chloride  SA (KLOR-CON  M) 20 MEQ tablet Take 1 tablet (20 mEq total) by mouth daily. 30 tablet 3   sacubitril -valsartan  (ENTRESTO ) 49-51 MG Take 1 tablet by mouth 2 (two) times daily. 28 tablet 0   spironolactone  (ALDACTONE ) 25 MG tablet Take 1 tablet (25 mg total) by mouth daily. 90 tablet 1   torsemide  (DEMADEX ) 20 MG tablet Take 2 tablets (40 mg total) by mouth daily. 60 tablet 3   traZODone  (DESYREL ) 50 MG tablet Take 1 tablet (50 mg total) by mouth at bedtime as needed. 30 tablet 1   No current facility-administered medications for this visit.    Musculoskeletal: Strength & Muscle Tone: within normal limits Gait & Station: normal Patient leans: N/A  Psychiatric Specialty Exam: Review of Systems  Psychiatric/Behavioral:  Negative for decreased concentration, dysphoric mood, hallucinations, self-injury, sleep disturbance and suicidal ideas. The patient is not nervous/anxious and is not hyperactive.     Blood pressure 124/80, pulse 79, temperature 98.4 F (36.9 C), temperature source Oral, height 5' (1.524 m), weight 130 lb (59 kg), SpO2 100%.Body mass index is  25.39 kg/m.  General Appearance: Casual  Eye Contact:  Good  Speech:  Clear and Coherent and Normal Rate  Volume:  Normal  Mood:  Euthymic  Affect:  Appropriate  Thought Process:  Coherent, Goal Directed, and Descriptions of Associations: Intact  Orientation:  Full (Time, Place, and Person)  Thought Content:  WDL  Suicidal Thoughts:  No  Homicidal Thoughts:  No  Memory:  Immediate;   Good Recent;   Good Remote;   Good  Judgement:  Good  Insight:  Good  Psychomotor Activity:  Normal  Concentration:  Concentration: Good and Attention Span: Good  Recall:  Good  Fund of Knowledge:Good  Language: Good  Akathisia:  No  Handed:  Right  AIMS (if indicated):  not done  Assets:  Communication Skills Desire for Improvement Housing Social Support  ADL's:  Intact  Cognition: WNL  Sleep:  Good   Screenings: AIMS    Flowsheet Row [facility]  AIMS Total Score 0   AUDIT    Flowsheet Row [facility]  Alcohol Use Disorder Identification Test Final Score (AUDIT) 0   GAD-7    Flowsheet Row Office Visit from 11/05/2023 in [facility] Counselor from 11/02/2023 in [facility] Video Visit from 08/31/2022 in [facility] Video Visit from 10/11/2021 in [facility] Video Visit from 03/30/2021 in [facility]  Total GAD-7 Score 0 0 3 4 0   PHQ2-9    Flowsheet Row Office Visit from 11/05/2023 in [facility] Counselor from 11/02/2023 in [facility] Video Visit from 08/31/2022 in [facility] Video Visit from 10/11/2021 in [facility] Video Visit from 03/30/2021 in [facility]   PHQ-2 Total Score 0 2 0 3 0  PHQ-9 Total Score -- 3 3 5  0   Flowsheet Row Office Visit from 11/05/2023 in [facility] Counselor from 11/02/2023 in [facility] ED to Hosp-Admission (Discharged) from 03/29/2023 in [facility] HF PCU  C-SSRS RISK CATEGORY No Risk No Risk No Risk    Assessment and Plan:   Kathy Rosales is a 48 year old female with a past psychiatric history significant for major depressive disorder (recurrent, in full remission) who presents to [facility] to reestablish psychiatric care for medication management.  Patient presents to the encounter requesting refills on her previously prescribed medications.  Patient was last seen by Dr. Harl on 08/31/2022.  During her last encounter, patient was being managed on the following psychiatric medications:  Trazodone  100 mg at bedtime (1 to 2 tablets) as needed Paroxetine  20 mg daily  Patient informed provider that she is currently taking Ativan  50 mg at bedtime and paroxetine  40 mg daily and states that the medications have been effective in managing her depressive symptoms.  Patient denies overt depressive symptoms but states that she feels her depressive symptoms starting to resurface.  Patient denies anxiety at this time.  A PHQ-9 screen was performed with the patient scoring a 0.  A GAD-7 screen was also performed the patient scoring a 0.  Provider recommended patient be placed on paroxetine  20 mg daily for 6 days, followed by 40 mg daily for the management of her depressive symptoms and anxiety.  Patient was agreeable to recommendation.  Patient to be placed back on trazodone  50 mg at bedtime.  Patient's medications to be e-prescribed to pharmacy of choice.  A [place] Suicide Severity Rating Scale was performed with the patient being considered no risk.  Patient denies suicidal ideations and is able to contract for  safety following the conclusion of the encounter.  Collaboration of Care: Medication Management AEB provider managing patient's psychiatric medications, Psychiatrist AEB patient being followed by a mental health provider at this facility, and Other provider involved in patient's care AEB patient being seen by cardiology  Patient/Guardian was advised Release of Information must  be obtained prior to any record release in order to collaborate their care with an outside provider. Patient/Guardian was advised if they have not already done so to contact the registration department to sign all necessary forms in order for us  to release information regarding their care.   Consent: Patient/Guardian gives verbal consent for treatment and assignment of benefits for services provided during this visit. Patient/Guardian expressed understanding and agreed to proceed.   1. Recurrent major depressive disorder, in full remission (HCC)  - PARoxetine  (PAXIL ) 40 MG tablet; Take 0.5 tablets (20 mg total) by mouth daily for 6 days, THEN 1 tablet (40 mg total) daily.  Dispense: 30 tablet; Refill: 1 - traZODone  (DESYREL ) 50 MG tablet; Take 1 tablet (50 mg total) by mouth at bedtime as needed.  Dispense: 30 tablet; Refill: 1  Patient to follow up in 6 weeks Provider spent a total of 30 minutes with the patient/reviewing the patient's chart  Reginia FORBES Bolster, PA 8/4/202512:37 PM

## 2023-11-07 ENCOUNTER — Other Ambulatory Visit: Payer: Self-pay

## 2023-11-09 ENCOUNTER — Other Ambulatory Visit (HOSPITAL_COMMUNITY): Payer: Self-pay

## 2023-11-13 ENCOUNTER — Other Ambulatory Visit (HOSPITAL_COMMUNITY): Payer: Self-pay

## 2023-11-13 MED ORDER — BUPRENORPHINE HCL-NALOXONE HCL 8-2 MG SL SUBL
1.0000 | SUBLINGUAL_TABLET | Freq: Two times a day (BID) | SUBLINGUAL | 0 refills | Status: AC
  Filled 2023-11-16: qty 60, 30d supply, fill #0

## 2023-11-13 MED ORDER — BUPRENORPHINE HCL-NALOXONE HCL 8-2 MG SL SUBL
1.0000 | SUBLINGUAL_TABLET | Freq: Two times a day (BID) | SUBLINGUAL | 0 refills | Status: DC | PRN
  Filled 2023-11-13 – 2023-12-24 (×2): qty 60, 30d supply, fill #0

## 2023-11-15 ENCOUNTER — Encounter: Payer: Self-pay | Admitting: Hematology

## 2023-11-15 ENCOUNTER — Other Ambulatory Visit (HOSPITAL_COMMUNITY): Payer: Self-pay

## 2023-11-15 MED ORDER — GABAPENTIN 300 MG PO CAPS
300.0000 mg | ORAL_CAPSULE | Freq: Three times a day (TID) | ORAL | 1 refills | Status: AC | PRN
  Filled 2023-11-15: qty 90, 30d supply, fill #0

## 2023-11-16 ENCOUNTER — Other Ambulatory Visit (HOSPITAL_COMMUNITY): Payer: Self-pay

## 2023-11-16 ENCOUNTER — Encounter: Payer: Self-pay | Admitting: Hematology

## 2023-11-19 ENCOUNTER — Other Ambulatory Visit (HOSPITAL_COMMUNITY): Payer: Self-pay

## 2023-11-20 ENCOUNTER — Other Ambulatory Visit (HOSPITAL_COMMUNITY): Payer: Self-pay

## 2023-11-21 ENCOUNTER — Other Ambulatory Visit (HOSPITAL_COMMUNITY): Payer: Self-pay

## 2023-12-04 ENCOUNTER — Encounter: Payer: Self-pay | Admitting: Hematology

## 2023-12-04 ENCOUNTER — Other Ambulatory Visit: Payer: Self-pay

## 2023-12-05 ENCOUNTER — Other Ambulatory Visit: Payer: Self-pay

## 2023-12-18 ENCOUNTER — Other Ambulatory Visit (HOSPITAL_COMMUNITY): Payer: Self-pay

## 2023-12-18 ENCOUNTER — Encounter (HOSPITAL_COMMUNITY): Payer: Self-pay | Admitting: Physician Assistant

## 2023-12-18 ENCOUNTER — Other Ambulatory Visit: Payer: Self-pay

## 2023-12-18 ENCOUNTER — Telehealth (HOSPITAL_COMMUNITY): Admitting: Physician Assistant

## 2023-12-18 DIAGNOSIS — F3342 Major depressive disorder, recurrent, in full remission: Secondary | ICD-10-CM | POA: Diagnosis not present

## 2023-12-18 MED ORDER — PAROXETINE HCL 40 MG PO TABS
40.0000 mg | ORAL_TABLET | Freq: Every day | ORAL | 2 refills | Status: DC
  Filled 2023-12-18 – 2024-02-11 (×5): qty 30, 30d supply, fill #0

## 2023-12-18 MED ORDER — TRAZODONE HCL 50 MG PO TABS
50.0000 mg | ORAL_TABLET | Freq: Every evening | ORAL | 2 refills | Status: DC | PRN
  Filled 2023-12-18 – 2024-02-11 (×5): qty 30, 30d supply, fill #0

## 2023-12-18 NOTE — Progress Notes (Signed)
 [facility] MD/PA/NP OP Progress Note  Virtual Visit via Video Note  I connected with Kathy Rosales on 12/18/23 at 10:30 AM EDT by a video enabled telemedicine application and verified that I am speaking with the correct person using two identifiers.  Location: Patient: Home Provider: Clinic   I discussed the limitations of evaluation and management by telemedicine and the availability of in person appointments. The patient expressed understanding and agreed to proceed.  Follow Up Instructions:  I discussed the assessment and treatment plan with the patient. The patient was provided an opportunity to ask questions and all were answered. The patient agreed with the plan and demonstrated an understanding of the instructions.   The patient was advised to call back or seek an in-person evaluation if the symptoms worsen or if the condition fails to improve as anticipated.  I provided 10 minutes of non-face-to-face time during this encounter.  Kathy FORBES Bolster, PA   12/18/2023 11:54 AM Kathy Rosales  MRN:  [id number]  Chief Complaint:  Chief Complaint  Patient presents with   Follow-up   Medication Refill   HPI:   Kathy Rosales is a 48 year old female with a past psychiatric history significant for major depressive disorder (recurrent, in full remission) presents to [facility] for follow-up and medication management.  Patient is currently being managed on the following psychiatric medications:  Paroxetine  40 mg daily Trazodone  50 mg at bedtime as needed  Patient reports that she has been taking her medications regularly and denies experiencing any adverse side effects at this time.  Patient denies overt depressive symptoms nor does she endorse anxiety.  Patient denies any other issues or concerns at this time.  A PHQ-9 screen was performed with the patient scoring a 0. A GAD-7 screen was also performed with the  patient scoring a 0.  Patient is alert and oriented x 4, calm, cooperative, and fully engaged in conversation during the encounter.  Patient endorses good mood.  Patient exhibits euthymic mood with appropriate affect.  Patient denies suicidal or homicidal ideations.  She further denies auditory or visual hallucinations and does not appear to be responding to internal/external stimuli.  Patient endorses good sleep an receives on average 8 hours of sleep per night.  Patient endorses good appetite and eats on average 2 meals per day.  Patient denies alcohol consumption, tobacco use, or illicit drug use.  Visit Diagnosis:    ICD-10-CM   1. Recurrent major depressive disorder, in full remission (HCC)  F33.42       Past Psychiatric History:  Patient endorses a past psychiatric history significant for depression and anxiety.   Patient denies a past history of hospitalization due to mental health.   Patient denies a past history of suicide attempt.   Patient denies a past history of homicide attempts.  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[facility])     Past Surgical History:  Procedure Laterality Date   COLONOSCOPY WITH PROPOFOL  N/A 07/23/2015   Procedure: COLONOSCOPY WITH PROPOFOL ;  Surgeon: Lamar Bunk, MD;  Location: [facility] END[facility]COPY;  Service: Endoscopy;  Laterality: N/A;   ESOPHAGOGASTRODUODENOSCOPY (EGD) WITH PROPOFOL  N/A 07/23/2015   Procedure: ESOPHAGOGASTRODUODENOSCOPY (EGD) WITH PROPOFOL ;  Surgeon: Lamar Bunk, MD;  Location: [facility];  Service: Endoscopy;  Laterality: N/A;   ESOPHAGOGASTRODUODENOSCOPY (EGD) WITH PROPOFOL  N/A 11/18/2015  Procedure: ESOPHAGOGASTRODUODENOSCOPY (EGD) WITH PROPOFOL ;  Surgeon: Lamar Bunk, MD;  Location: [facility]OS[facility];  Service: Endoscopy;  Laterality: N/A;   RIGHT/LEFT HEART CATH AND CORONARY  ANGIOGRAPHY N/A 05/22/2016   Procedure: Right/Left Heart Cath and Coronary Angiography;  Surgeon: Peter M Sw[place], MD;  Location: [facility] INVASIVE CV LAB;  Service: Cardiovascular;  Laterality: N/A;   SUBQ ICD IMPLANT N/A 09/27/2016   Procedure: SubQ ICD Implant;  Surgeon: Waddell Danelle ORN, MD;  Location: [facility] INVASIVE CV LAB;  Service: Cardiovascular;  Laterality: N/A;    Family Psychiatric History:  Patient denies a family history of psychiatric illness.   Family history of suicide attempt: Patient denies Family history of homicide attempt: Patient denies Family history of substance abuse: Patient denies  Family History:  Family History  Problem Relation Age of Onset   Cancer Paternal Grandfather    Mental illness Neg Hx     Social History:  Social History   Socioeconomic History   Marital status: Single    Spouse name: Not on file   Number of children: 1   Years of education: Not on file   Highest education level: Not on file  Occupational History   Occupation: unemployeed  Tobacco Use   Smoking status: Never   Smokeless tobacco: Never  Vaping Use   Vaping status: Never Used  Substance and Sexual Activity   Alcohol use: No   Drug use: No   Sexual activity: Not Currently    Birth control/protection: None  Other Topics Concern   Not on file  Social History Narrative   ** Merged History Encounter **       ** Merged History Encounter **       Social Drivers of Corporate investment banker Strain: High Risk (05/04/2023)   Overall Financial Resource Strain (CARDIA)    Difficulty of Paying Living Expenses: Hard  Food Insecurity: No Food Insecurity (03/30/2023)   Hunger Vital Sign    Worried About Running Out of Food in the Last Year: Never true    Ran Out of Food in the Last Year: Never true  Transportation Needs: No Transportation Needs (03/30/2023)   PRAPARE - Administrator, Civil Service (Medical): No    Lack of Transportation (Non-Medical): No  Physical  Activity: Not on file  Stress: Not on file  Social Connections: Not on file    Allergies:  Allergies  Allergen Reactions   Tramadol  Itching and Other (See Comments)    Red spots all over body and itching   Furosemide  Rash   Leflunomide Rash    Metabolic Disorder Labs: Lab Results  Component Value Date   HGBA1C 5.1 03/29/2023   MPG 100 03/29/2023   MPG 111.15 07/08/2021   No results found for: PROLACTIN Lab Results  Component Value Date   CHOL 157 03/29/2023   TRIG 94 03/29/2023   HDL 81 03/29/2023   CHOLHDL 1.9 03/29/2023   VLDL 19 03/29/2023   LDLCALC 57 03/29/2023   LDLCALC 73 12/14/2015   Lab Results  Component Value Date   TSH 2.530 06/09/2019   TSH 1.670 09/18/2017    Therapeutic Level Labs: No results found for: LITHIUM No results found for: VALPROATE No results found for: CBMZ  Current Medications: Current Outpatient Medications  Medication Sig Dispense Refill   amoxicillin  (AMOXIL ) 500 MG capsule Take 1 capsule (500 mg total) by mouth every 8 (eight) hours until finished. 21 capsule 0   buprenorphine -naloxone  (SUBOXONE ) 8-2 mg SUBL SL tablet Place  1 tablet under the tongue 2 (two) times daily as needed. 60 tablet 0   buprenorphine -naloxone  (SUBOXONE ) 8-2 mg SUBL SL tablet Place 1 tablet under the tongue 2 (two) times daily as needed. 60 tablet 0   buprenorphine -naloxone  (SUBOXONE ) 8-2 mg SUBL SL tablet Place 1 tablet under the tongue 2 (two) times daily as needed. 60 tablet 0   buprenorphine -naloxone  (SUBOXONE ) 8-2 mg SUBL SL tablet Place 1 tablet twice a day by sublingual route as needed. 60 tablet 0   carvedilol  (COREG ) 3.125 MG tablet Take 1 tablet (3.125 mg total) by mouth 2 (two) times daily with a meal. 180 tablet 1   chlorhexidine  (PERIDEX ) 0.12 % solution Swish for one minute, then spit out, twice daily for 10 days. 473 mL 0   empagliflozin  (JARDIANCE ) 10 MG TABS tablet Take 1 tablet (10 mg total) by mouth daily before breakfast.      gabapentin  (NEURONTIN ) 300 MG capsule Take 1 capsule (300 mg total) by mouth 3 (three) times daily as needed. 90 capsule 1   gabapentin  (NEURONTIN ) 300 MG capsule Take 1 capsule (300 mg total) by mouth 3 (three) times daily as needed. 90 capsule 1   ibuprofen  (ADVIL ) 800 MG tablet Take 1 tablet (800 mg total) by mouth every 8 (eight) hours as needed for pain. 20 tablet 0   PARoxetine  (PAXIL ) 40 MG tablet Take 0.5 tablets (20 mg total) by mouth daily for 6 days, THEN 1 tablet (40 mg total) daily. 30 tablet 1   potassium chloride  SA (KLOR-CON  M) 20 MEQ tablet Take 1 tablet (20 mEq total) by mouth daily. 30 tablet 3   sacubitril -valsartan  (ENTRESTO ) 49-51 MG Take 1 tablet by mouth 2 (two) times daily. 28 tablet 0   spironolactone  (ALDACTONE ) 25 MG tablet Take 1 tablet (25 mg total) by mouth daily. 90 tablet 1   torsemide  (DEMADEX ) 20 MG tablet Take 2 tablets (40 mg total) by mouth daily. 60 tablet 3   traZODone  (DESYREL ) 50 MG tablet Take 1 tablet (50 mg total) by mouth at bedtime as needed. 30 tablet 1   No current facility-administered medications for this visit.     Musculoskeletal: Strength & Muscle Tone: within normal limits Gait & Station: normal Patient leans: N/A  Psychiatric Specialty Exam: Review of Systems  Psychiatric/Behavioral:  Negative for decreased concentration, dysphoric mood, hallucinations, self-injury, sleep disturbance and suicidal ideas. The patient is not nervous/anxious and is not hyperactive.     There were no vitals taken for this visit.There is no height or weight on file to calculate BMI.  General Appearance: Casual  Eye Contact:  Good  Speech:  Clear and Coherent and Normal Rate  Volume:  Normal  Mood:  Euthymic  Affect:  Appropriate  Thought Process:  Coherent, Goal Directed, and Descriptions of Associations: Intact  Orientation:  Full (Time, Place, and Person)  Thought Content: WDL   Suicidal Thoughts:  No  Homicidal Thoughts:  No  Memory:  Immediate;    Good Recent;   Good Remote;   Good  Judgement:  Good  Insight:  Good  Psychomotor Activity:  Normal  Concentration:  Concentration: Good and Attention Span: Good  Recall:  Good  Fund of Knowledge: Good  Language: Good  Akathisia:  No  Handed:  Right  AIMS (if indicated): not done  Assets:  Communication Skills Desire for Improvement Housing Social Support  ADL's:  Intact  Cognition: WNL  Sleep:  Good   Screenings: AIMS    Flowsheet Row [facility]  AIMS Total Score 0   AUDIT    Flowsheet Row [facility]  Alcohol Use Disorder Identification Test Final Score (AUDIT) 0   GAD-7    Flowsheet Row Video Visit from 12/18/2023 in [facility] Office Visit from 11/05/2023 in [facility] Counselor from 11/02/2023 in [facility] Video Visit from 08/31/2022 in [facility] Video Visit from 10/11/2021 in [facility]  Total GAD-7 Score 0 0 0 3 4   PHQ2-9    Flowsheet Row Video Visit from 12/18/2023 in [facility] Office Visit from 11/05/2023 in [facility] Counselor from 11/02/2023 in [facility] Video Visit from 08/31/2022 in [facility] Video Visit from 10/11/2021 in [facility]  PHQ-2 Total Score 0 0 2 0 3  PHQ-9 Total Score -- -- 3 3 5    Flowsheet Row Video Visit from 12/18/2023 in [facility] Office Visit from 11/05/2023 in [facility] Counselor from 11/02/2023 in [facility]  C-SSRS RISK CATEGORY No Risk No Risk No Risk     Assessment and Plan:   Fabianna Keats. Rosales is a  48 year old female with a past psychiatric history significant for major depressive disorder (recurrent, in full remission) presents to [facility] for follow-up and medication management.  Patient dents to the encounter stating that she continues to take her medications regularly and denies experiencing any adverse side effects.  Patient denies overt depressive symptoms nor does she endorse anxiety.  A PHQ-9 screen was performed with the patient scoring a 0.  A GAD-7 screen was also performed with the patient scoring a 0.  Patient endorses stability on her current medication regimen and would like to continue taking her medications as prescribed.  A [place] Suicide Severity Rating Scale was performed with the patient being considered no risk.  Patient denies suicidal ideations and is able to contract for safety at this time.   Collaboration of Care: Collaboration of Care: Medication Management AEB  provider managing patient's psychiatric medication, Psychiatrist AEB patient being follow by mental health provider at this facility, and Other provider involved in patient's care AEB provider being seen by cardiology  Patient/Guardian was advised Release of Information must be obtained prior to any record release in order to collaborate their care with an outside provider. Patient/Guardian was advised if they have not already done so to contact the registration department to sign all necessary forms in order for us  to release information regarding their care.   Consent: Patient/Guardian gives verbal consent for treatment and assignment of benefits for services provided during this visit. Patient/Guardian expressed understanding and agreed to proceed.   1. Recurrent major depressive disorder, in full remission (HCC)  - PARoxetine  (PAXIL ) 40 MG tablet; Take 1 tablet (40 mg total) by mouth daily.  Dispense: 30 tablet; Refill: 2 - traZODone  (DESYREL ) 50 MG tablet; Take  1 tablet (50 mg total) by mouth at bedtime as needed.  Dispense: 30 tablet; Refill: 2  Patient to follow up in 2 months Provider spent a total of 10 minutes with the patient/reviewing the patient's chart  Kathy FORBES Bolster, PA 12/18/2023, 11:54 AM

## 2023-12-24 ENCOUNTER — Other Ambulatory Visit (HOSPITAL_COMMUNITY): Payer: Self-pay

## 2023-12-24 ENCOUNTER — Encounter: Payer: Self-pay | Admitting: Hematology

## 2023-12-25 ENCOUNTER — Other Ambulatory Visit (HOSPITAL_COMMUNITY): Payer: Self-pay

## 2023-12-27 ENCOUNTER — Other Ambulatory Visit (HOSPITAL_COMMUNITY): Payer: Self-pay

## 2024-01-01 ENCOUNTER — Ambulatory Visit (INDEPENDENT_AMBULATORY_CARE_PROVIDER_SITE_OTHER): Payer: Self-pay

## 2024-01-01 DIAGNOSIS — I5022 Chronic systolic (congestive) heart failure: Secondary | ICD-10-CM

## 2024-01-02 NOTE — Progress Notes (Signed)
 Remote ICD Transmission

## 2024-01-03 ENCOUNTER — Other Ambulatory Visit (HOSPITAL_COMMUNITY): Payer: Self-pay

## 2024-01-03 LAB — CUP PACEART REMOTE DEVICE CHECK
Battery Remaining Percentage: 17 %
Date Time Interrogation Session: 20250930202600
HighPow Impedance: 85 Ohm
Implantable Lead Connection Status: 753985
Implantable Lead Implant Date: 20180627
Implantable Lead Location: 753862
Implantable Lead Model: 3401
Implantable Lead Serial Number: 113145
Implantable Pulse Generator Implant Date: 20180627
Pulse Gen Serial Number: 222625

## 2024-01-06 ENCOUNTER — Ambulatory Visit: Payer: Self-pay | Admitting: Internal Medicine

## 2024-01-07 NOTE — Progress Notes (Signed)
 Remote ICD Transmission

## 2024-01-08 ENCOUNTER — Other Ambulatory Visit: Payer: Self-pay

## 2024-01-08 ENCOUNTER — Encounter: Payer: Self-pay | Admitting: Hematology

## 2024-01-15 ENCOUNTER — Other Ambulatory Visit (HOSPITAL_COMMUNITY): Payer: Self-pay

## 2024-01-15 MED ORDER — BUPRENORPHINE HCL-NALOXONE HCL 8-2 MG SL SUBL
1.0000 | SUBLINGUAL_TABLET | Freq: Two times a day (BID) | SUBLINGUAL | 0 refills | Status: AC | PRN
  Filled 2024-01-24: qty 60, 30d supply, fill #0

## 2024-01-24 ENCOUNTER — Other Ambulatory Visit (HOSPITAL_COMMUNITY): Payer: Self-pay

## 2024-01-24 ENCOUNTER — Encounter: Payer: Self-pay | Admitting: Hematology

## 2024-01-28 ENCOUNTER — Other Ambulatory Visit (HOSPITAL_COMMUNITY): Payer: Self-pay

## 2024-01-28 ENCOUNTER — Other Ambulatory Visit: Payer: Self-pay

## 2024-01-30 ENCOUNTER — Encounter: Payer: Self-pay | Admitting: Hematology

## 2024-01-30 ENCOUNTER — Other Ambulatory Visit (HOSPITAL_COMMUNITY): Payer: Self-pay

## 2024-02-08 ENCOUNTER — Other Ambulatory Visit (HOSPITAL_COMMUNITY): Payer: Self-pay

## 2024-02-11 ENCOUNTER — Encounter: Payer: Self-pay | Admitting: Hematology

## 2024-02-11 ENCOUNTER — Other Ambulatory Visit (HOSPITAL_COMMUNITY): Payer: Self-pay

## 2024-02-19 ENCOUNTER — Telehealth (INDEPENDENT_AMBULATORY_CARE_PROVIDER_SITE_OTHER): Admitting: Physician Assistant

## 2024-02-19 ENCOUNTER — Encounter (HOSPITAL_COMMUNITY): Payer: Self-pay | Admitting: Physician Assistant

## 2024-02-19 ENCOUNTER — Other Ambulatory Visit: Payer: Self-pay

## 2024-02-19 DIAGNOSIS — F3342 Major depressive disorder, recurrent, in full remission: Secondary | ICD-10-CM | POA: Diagnosis not present

## 2024-02-19 MED ORDER — PAROXETINE HCL 40 MG PO TABS
40.0000 mg | ORAL_TABLET | Freq: Every day | ORAL | 3 refills | Status: DC
  Filled 2024-02-19 – 2024-04-10 (×2): qty 30, 30d supply, fill #0

## 2024-02-19 MED ORDER — TRAZODONE HCL 50 MG PO TABS
50.0000 mg | ORAL_TABLET | Freq: Every evening | ORAL | 3 refills | Status: DC | PRN
  Filled 2024-02-19 – 2024-04-10 (×2): qty 30, 30d supply, fill #0

## 2024-02-19 NOTE — Progress Notes (Signed)
 [facility] MD/PA/NP OP Progress Note  Virtual Visit via Video Note  I connected with Kathy Rosales on 02/19/24 at 10:00 AM EST by a video enabled telemedicine application and verified that I am speaking with the correct person using two identifiers.  Location: Patient: Home Provider: Clinic   I discussed the limitations of evaluation and management by telemedicine and the availability of in person appointments. The patient expressed understanding and agreed to proceed.  Follow Up Instructions:  I discussed the assessment and treatment plan with the patient. The patient was provided an opportunity to ask questions and all were answered. The patient agreed with the plan and demonstrated an understanding of the instructions.   The patient was advised to call back or seek an in-person evaluation if the symptoms worsen or if the condition fails to improve as anticipated.  I provided 10 minutes of non-face-to-face time during this encounter.  Reginia FORBES Bolster, PA   02/19/2024 10:10 AM Kathy Rosales  MRN:  [id number]  Chief Complaint:  Chief Complaint  Patient presents with   Follow-up   Medication Refill   HPI:   Kathy Rosales is a 48 year old female with a past psychiatric history significant for major depressive disorder (recurrent, in full remission) presents to [facility] for follow-up and medication management.  Patient is currently being managed on the following psychiatric medications:  Paroxetine  40 mg daily Trazodone  50 mg at bedtime as needed  Patient presents to the encounter stating that she has been taking her medications regularly and denies experiencing any adverse side effects.  Patient denies overt depressive symptoms nor does she endorse anxiety.  Patient further denies any new stressors at this time.  A PHQ-9 screen was performed with the patient scoring a 0.  A GAD-7 screen was also performed  with the patient scoring a 0.  Patient is alert and oriented x 4, calm, cooperative, and fully engaged in conversation during the encounter.  Patient endorses very good mood.  Patient exhibits euthymic mood with appropriate affect.  Patient denies suicidal or homicidal ideation.  She further denies auditory or visual hallucinations and does not appear to be responding to internal/external stimuli.  Patient endorses good sleep and receives on average 8 hours of sleep per night.  Patient endorses good appetite and eats on average 3 meals per day.  Patient denies alcohol consumption, tobacco use, or illicit drug use.  Visit Diagnosis:    ICD-10-CM   1. Recurrent major depressive disorder, in full remission  F33.42 PARoxetine  (PAXIL ) 40 MG tablet    traZODone  (DESYREL ) 50 MG tablet      Past Psychiatric History:  Patient endorses a past psychiatric history significant for depression and anxiety.   Patient denies a past history of hospitalization due to mental health.   Patient denies a past history of suicide attempt.   Patient denies a past history of homicide attempts.  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[facility])     Past Surgical History:  Procedure Laterality Date   COLONOSCOPY WITH PROPOFOL  N/A 07/23/2015   Procedure: COLONOSCOPY WITH PROPOFOL ;  Surgeon: Lamar Bunk, MD;  Location: [facility] ENDOSCOPY;  Service: Endoscopy;  Laterality: N/A;   ESOPHAGOGASTRODUODENOSCOPY (EGD) WITH PROPOFOL  N/A 07/23/2015   Procedure: ESOPHAGOGASTRODUODENOSCOPY (EGD) WITH PROPOFOL ;  Surgeon: Lamar Bunk, MD;  Location: [facility]OSCOPY;  Service:  Endoscopy;  Laterality: N/A;   ESOPHAGOGASTRODUODENOSCOPY (EGD) WITH PROPOFOL  N/A 11/18/2015   Procedure: ESOPHAGOGASTRODUODENOSCOPY (EGD) WITH PROPOFOL ;  Surgeon: Lamar Bunk, MD;  Location: [facility]OS[facility];   Service: Endoscopy;  Laterality: N/A;   RIGHT/LEFT HEART CATH AND CORONARY ANGIOGRAPHY N/A 05/22/2016   Procedure: Right/Left Heart Cath and Coronary Angiography;  Surgeon: Peter M Jordan, MD;  Location: [facility] INVASIVE CV LAB;  Service: Cardiovascular;  Laterality: N/A;   SUBQ ICD IMPLANT N/A 09/27/2016   Procedure: SubQ ICD Implant;  Surgeon: Waddell Danelle ORN, MD;  Location: [facility] INVASIVE CV LAB;  Service: Cardiovascular;  Laterality: N/A;    Family Psychiatric History:  Patient denies a family history of psychiatric illness.   Family history of suicide attempt: Patient denies Family history of homicide attempt: Patient denies Family history of substance abuse: Patient denies  Family History:  Family History  Problem Relation Age of Onset   Cancer Paternal Grandfather    Mental illness Neg Hx     Social History:  Social History   Socioeconomic History   Marital status: Single    Spouse name: Not on file   Number of children: 1   Years of education: Not on file   Highest education level: Not on file  Occupational History   Occupation: unemployeed  Tobacco Use   Smoking status: Never   Smokeless tobacco: Never  Vaping Use   Vaping status: Never Used  Substance and Sexual Activity   Alcohol use: No   Drug use: No   Sexual activity: Not Currently    Birth control/protection: None  Other Topics Concern   Not on file  Social History Narrative   ** Merged History Encounter **       ** Merged History Encounter **       Social Drivers of Corporate Investment Banker Strain: High Risk (05/04/2023)   Overall Financial Resource Strain (CARDIA)    Difficulty of Paying Living Expenses: Hard  Food Insecurity: No Food Insecurity (03/30/2023)   Hunger Vital Sign    Worried About Running Out of Food in the Last Year: Never true    Ran Out of Food in the Last Year: Never true  Transportation Needs: No Transportation Needs (03/30/2023)   PRAPARE - Scientist, Research (physical Sciences) (Medical): No    Lack of Transportation (Non-Medical): No  Physical Activity: Not on file  Stress: Not on file  Social Connections: Not on file    Allergies:  Allergies  Allergen Reactions   Tramadol  Itching and Other (See Comments)    Red spots all over body and itching   Furosemide  Rash   Leflunomide Rash    Metabolic Disorder Labs: Lab Results  Component Value Date   HGBA1C 5.1 03/29/2023   MPG 100 03/29/2023   MPG 111.15 07/08/2021   No results found for: PROLACTIN Lab Results  Component Value Date   CHOL 157 03/29/2023   TRIG 94 03/29/2023   HDL 81 03/29/2023   CHOLHDL 1.9 03/29/2023   VLDL 19 03/29/2023   LDLCALC 57 03/29/2023   LDLCALC 73 12/14/2015   Lab Results  Component Value Date   TSH 2.530 06/09/2019   TSH 1.670 09/18/2017    Therapeutic Level Labs: No results found for: LITHIUM No results found for: VALPROATE No results found for: CBMZ  Current Medications: Current Outpatient Medications  Medication Sig Dispense Refill   amoxicillin  (AMOXIL ) 500 MG capsule Take 1 capsule (500 mg total) by mouth every 8 (eight) hours until  finished. 21 capsule 0   buprenorphine -naloxone  (SUBOXONE ) 8-2 mg SUBL SL tablet Place 1 tablet under the tongue 2 (two) times daily as needed. 60 tablet 0   buprenorphine -naloxone  (SUBOXONE ) 8-2 mg SUBL SL tablet Place 1 tablet under the tongue 2 (two) times daily as needed. 60 tablet 0   buprenorphine -naloxone  (SUBOXONE ) 8-2 mg SUBL SL tablet Place 1 tablet under the tongue 2 (two) times daily as needed. 60 tablet 0   buprenorphine -naloxone  (SUBOXONE ) 8-2 mg SUBL SL tablet Place 1 tablet under the tongue 2 (two) times daily as needed. NO ALCOHOL. 60 tablet 0   carvedilol  (COREG ) 3.125 MG tablet Take 1 tablet (3.125 mg total) by mouth 2 (two) times daily with a meal. 180 tablet 1   chlorhexidine  (PERIDEX ) 0.12 % solution Swish for one minute, then spit out, twice daily for 10 days. 473 mL 0   empagliflozin   (JARDIANCE ) 10 MG TABS tablet Take 1 tablet (10 mg total) by mouth daily before breakfast.     gabapentin  (NEURONTIN ) 300 MG capsule Take 1 capsule (300 mg total) by mouth 3 (three) times daily as needed. 90 capsule 1   gabapentin  (NEURONTIN ) 300 MG capsule Take 1 capsule (300 mg total) by mouth 3 (three) times daily as needed. 90 capsule 1   ibuprofen  (ADVIL ) 800 MG tablet Take 1 tablet (800 mg total) by mouth every 8 (eight) hours as needed for pain. 20 tablet 0   PARoxetine  (PAXIL ) 40 MG tablet Take 1 tablet (40 mg total) by mouth daily. 30 tablet 3   potassium chloride  SA (KLOR-CON  M) 20 MEQ tablet Take 1 tablet (20 mEq total) by mouth daily. 30 tablet 3   sacubitril -valsartan  (ENTRESTO ) 49-51 MG Take 1 tablet by mouth 2 (two) times daily. 28 tablet 0   spironolactone  (ALDACTONE ) 25 MG tablet Take 1 tablet (25 mg total) by mouth daily. 90 tablet 1   torsemide  (DEMADEX ) 20 MG tablet Take 2 tablets (40 mg total) by mouth daily. 60 tablet 3   traZODone  (DESYREL ) 50 MG tablet Take 1 tablet (50 mg total) by mouth at bedtime as needed. 30 tablet 3   No current facility-administered medications for this visit.     Musculoskeletal: Strength & Muscle Tone: within normal limits Gait & Station: normal Patient leans: N/A  Psychiatric Specialty Exam: Review of Systems  Psychiatric/Behavioral:  Negative for decreased concentration, dysphoric mood, hallucinations, self-injury, sleep disturbance and suicidal ideas. The patient is not nervous/anxious and is not hyperactive.     There were no vitals taken for this visit.There is no height or weight on file to calculate BMI.  General Appearance: Casual  Eye Contact:  Good  Speech:  Clear and Coherent and Normal Rate  Volume:  Normal  Mood:  Euthymic  Affect:  Appropriate  Thought Process:  Coherent, Goal Directed, and Descriptions of Associations: Intact  Orientation:  Full (Time, Place, and Person)  Thought Content: WDL   Suicidal Thoughts:  No   Homicidal Thoughts:  No  Memory:  Immediate;   Good Recent;   Good Remote;   Good  Judgement:  Good  Insight:  Good  Psychomotor Activity:  Normal  Concentration:  Concentration: Good and Attention Span: Good  Recall:  Good  Fund of Knowledge: Good  Language: Good  Akathisia:  No  Handed:  Right  AIMS (if indicated): not done  Assets:  Communication Skills Desire for Improvement Housing Social Support  ADL's:  Intact  Cognition: WNL  Sleep:  Good   Screenings:  AIMS    Flowsheet Row [facility]  AIMS Total Score 0   AUDIT    Flowsheet Row [facility]  Alcohol Use Disorder Identification Test Final Score (AUDIT) 0   GAD-7    Flowsheet Row Video Visit from 02/19/2024 in [facility] Video Visit from 12/18/2023 in [facility] Office Visit from 11/05/2023 in [facility] Counselor from 11/02/2023 in [facility] Video Visit from 08/31/2022 in [facility]  Total GAD-7 Score 0 0 0 0 3   PHQ2-9    Flowsheet Row Video Visit from 02/19/2024 in [facility] Video Visit from 12/18/2023 in [facility] Office Visit from 11/05/2023 in [facility] Counselor from 11/02/2023 in [facility] Video Visit from 08/31/2022 in [facility]  PHQ-2 Total Score 0 0 0 2 0  PHQ-9 Total Score -- -- -- 3 3   Flowsheet Row Video Visit from 02/19/2024 in [facility] Video Visit from 12/18/2023 in [facility] Office Visit from 11/05/2023 in [facility]  C-SSRS RISK CATEGORY No Risk No Risk No Risk      Assessment and Plan:   Kathy Rosales is a 48 year old female with a past psychiatric history significant for major depressive disorder (recurrent, in full remission) presents to [facility] for follow-up and medication management.  Patient presents to the encounter stating that she has been taking her medications regularly and denies experiencing any adverse side effects.  Patient denies overt depressive symptoms nor does she endorse anxiety.  A PHQ 2 screen was performed with the patient scoring a 0.  A GAD-7 screen was also performed with the patient scoring a 0.  Patient endorses stability on her current medication regimen and denies the need for dosage adjustments at this time.  Patient's medications to be e-prescribed to pharmacy of choice.  A [place] Suicide Severity Rating Scale was performed with the patient being considered no risk.  Patient denies suicidal ideations and is able to contract for safety at this time.   Collaboration of Care: Collaboration of Care: Medication Management AEB  provider managing patient's psychiatric medication, Psychiatrist AEB patient being follow by mental health provider at this facility, and Other provider involved in patient's care AEB provider being seen by cardiology  Patient/Guardian was advised Release of Information must be obtained prior to any record release in order to collaborate their care with an outside provider. Patient/Guardian was advised if they have not already done so to contact the registration department to sign all necessary forms in order for us  to release information regarding their care.   Consent: Patient/Guardian gives verbal consent for treatment and assignment of benefits for services provided during this visit. Patient/Guardian expressed understanding and agreed to proceed.   1. Recurrent major depressive disorder, in full remission  - PARoxetine  (PAXIL ) 40 MG tablet;  Take 1 tablet (40 mg total) by mouth daily.  Dispense: 30 tablet; Refill: 3 - traZODone  (DESYREL ) 50 MG tablet; Take 1 tablet (50 mg total) by mouth at bedtime as needed.  Dispense: 30 tablet; Refill: 3  Patient to follow up in 3 months Provider spent a total of 10 minutes with the patient/reviewing the patient's chart  Natoya Viscomi E Tyvon Eggenberger, PA 02/19/2024, 10:10 AM

## 2024-03-04 ENCOUNTER — Other Ambulatory Visit (HOSPITAL_COMMUNITY): Payer: Self-pay

## 2024-03-04 ENCOUNTER — Encounter: Payer: Self-pay | Admitting: Hematology

## 2024-03-04 MED ORDER — BUPRENORPHINE HCL-NALOXONE HCL 8-2 MG SL SUBL
1.0000 | SUBLINGUAL_TABLET | Freq: Two times a day (BID) | SUBLINGUAL | 0 refills | Status: DC
  Filled 2024-03-04: qty 60, 30d supply, fill #0

## 2024-03-07 ENCOUNTER — Other Ambulatory Visit (HOSPITAL_COMMUNITY): Payer: Self-pay

## 2024-03-11 ENCOUNTER — Other Ambulatory Visit (HOSPITAL_COMMUNITY): Payer: Self-pay

## 2024-03-24 ENCOUNTER — Other Ambulatory Visit (HOSPITAL_COMMUNITY): Payer: Self-pay | Admitting: Cardiology

## 2024-04-01 ENCOUNTER — Ambulatory Visit (INDEPENDENT_AMBULATORY_CARE_PROVIDER_SITE_OTHER): Payer: Self-pay

## 2024-04-01 DIAGNOSIS — I5022 Chronic systolic (congestive) heart failure: Secondary | ICD-10-CM

## 2024-04-02 ENCOUNTER — Ambulatory Visit: Payer: Self-pay | Admitting: Internal Medicine

## 2024-04-02 LAB — CUP PACEART REMOTE DEVICE CHECK
Battery Remaining Percentage: 16 %
Battery Voltage: 16
Date Time Interrogation Session: 20251230203000
HighPow Impedance: 55 Ohm
Implantable Lead Connection Status: 753985
Implantable Lead Implant Date: 20180627
Implantable Lead Location: 753862
Implantable Lead Model: 3401
Implantable Lead Serial Number: 113145
Implantable Pulse Generator Implant Date: 20180627
Pulse Gen Serial Number: 222625

## 2024-04-02 NOTE — Progress Notes (Signed)
 Remote ICD Transmission

## 2024-04-10 ENCOUNTER — Encounter: Payer: Self-pay | Admitting: Hematology

## 2024-04-10 ENCOUNTER — Other Ambulatory Visit (HOSPITAL_COMMUNITY): Payer: Self-pay

## 2024-04-14 ENCOUNTER — Other Ambulatory Visit (HOSPITAL_COMMUNITY): Payer: Self-pay

## 2024-04-14 ENCOUNTER — Encounter: Payer: Self-pay | Admitting: Hematology

## 2024-04-14 MED ORDER — BUPRENORPHINE HCL-NALOXONE HCL 8-2 MG SL SUBL
1.0000 | SUBLINGUAL_TABLET | Freq: Two times a day (BID) | SUBLINGUAL | 0 refills | Status: AC | PRN
  Filled 2024-04-14: qty 60, 30d supply, fill #0

## 2024-04-15 ENCOUNTER — Other Ambulatory Visit (HOSPITAL_COMMUNITY): Payer: Self-pay

## 2024-04-17 ENCOUNTER — Other Ambulatory Visit (HOSPITAL_COMMUNITY): Payer: Self-pay

## 2024-04-21 ENCOUNTER — Other Ambulatory Visit (HOSPITAL_BASED_OUTPATIENT_CLINIC_OR_DEPARTMENT_OTHER): Payer: Self-pay

## 2024-04-22 ENCOUNTER — Other Ambulatory Visit: Payer: Self-pay

## 2024-04-22 ENCOUNTER — Telehealth (HOSPITAL_COMMUNITY): Admitting: Physician Assistant

## 2024-04-22 ENCOUNTER — Encounter (HOSPITAL_COMMUNITY): Payer: Self-pay | Admitting: Physician Assistant

## 2024-04-22 DIAGNOSIS — F3342 Major depressive disorder, recurrent, in full remission: Secondary | ICD-10-CM

## 2024-04-22 MED ORDER — PAROXETINE HCL 40 MG PO TABS
40.0000 mg | ORAL_TABLET | Freq: Every day | ORAL | 3 refills | Status: AC
  Filled 2024-04-22: qty 30, 30d supply, fill #0

## 2024-04-22 MED ORDER — TRAZODONE HCL 50 MG PO TABS
50.0000 mg | ORAL_TABLET | Freq: Every evening | ORAL | 3 refills | Status: AC | PRN
  Filled 2024-04-22: qty 30, 30d supply, fill #0

## 2024-04-22 NOTE — Progress Notes (Unsigned)
 [facility] MD/PA/NP OP Progress Note  Virtual Visit via Video Note  I connected with Kathy Rosales on 04/22/24 at 10:00 AM EST by a video enabled telemedicine application and verified that I am speaking with the correct person using two identifiers.  Location: Patient: Home Provider: Clinic   I discussed the limitations of evaluation and management by telemedicine and the availability of in person appointments. The patient expressed understanding and agreed to proceed.  Follow Up Instructions:  I discussed the assessment and treatment plan with the patient. The patient was provided an opportunity to ask questions and all were answered. The patient agreed with the plan and demonstrated an understanding of the instructions.   The patient was advised to call back or seek an in-person evaluation if the symptoms worsen or if the condition fails to improve as anticipated.  I provided 10 minutes of non-face-to-face time during this encounter.  Reginia FORBES Bolster, PA   04/22/2024 11:27 AM Sible Straley Riding  MRN:  [id number]  Chief Complaint:  Chief Complaint  Patient presents with   Follow-up   Medication Refill   HPI:   Kathy Rosales is a 49 year old female with a past psychiatric history significant for major depressive disorder (recurrent, in full remission) presents to [facility] for follow-up and medication management.  Patient is currently being managed on the following psychiatric medications:  Paroxetine  40 mg daily Trazodone  50 mg at bedtime as needed  Patient presents to the encounter stating that she has been taking her medications regularly and denies experiencing any adverse side effects at this time.  Patient denies overt depressive symptoms nor does she endorse anxiety.  Patient denies any new stressors at this time.  A PHQ-2 screen was performed with the patient scoring a 0.  A GAD-7 screen was also  performed with the patient scoring a 0.  Patient is alert and oriented x 4, calm, cooperative, and fully engaged in conversation during the encounter.  Patient endorses good mood.  Patient exhibits euthymic mood with appropriate affect.  Patient denies suicidal or homicidal ideations.  She further denies auditory or visual hallucinations and does not appear to be responding to internal/external stimuli.  Patient endorses good sleep and receives on average 8 to 9 hours of sleep per night.  Patient endorses good appetite and eats on average 3 meals per day.  Patient denies alcohol consumption, tobacco use, or illicit drug use.  Visit Diagnosis:    ICD-10-CM   1. Recurrent major depressive disorder, in full remission  F33.42 PARoxetine  (PAXIL ) 40 MG tablet    traZODone  (DESYREL ) 50 MG tablet      Past Psychiatric History:  Patient endorses a past psychiatric history significant for depression and anxiety.   Patient denies a past history of hospitalization due to mental health.   Patient denies a past history of suicide attempt.   Patient denies a past history of homicide attempts.  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[facility])     Past Surgical History:  Procedure Laterality Date   COLONOSCOPY WITH PROPOFOL  N/A 07/23/2015   Procedure: COLONOSCOPY WITH PROPOFOL ;  Surgeon: Lamar Bunk, MD;  Location: [facility] ENDOSCOPY;  Service: Endoscopy;  Laterality: N/A;   ESOPHAGOGASTRODUODENOSCOPY (EGD) WITH PROPOFOL  N/A 07/23/2015   Procedure: ESOPHAGOGASTRODUODENOSCOPY (EGD) WITH PROPOFOL ;  Surgeon: Lamar Bunk, MD;  Location: [facility];  Service: Endoscopy;  Laterality: N/A;   ESOPHAGOGASTRODUODENOSCOPY (EGD) WITH PROPOFOL  N/A 11/18/2015   Procedure: ESOPHAGOGASTRODUODENOSCOPY (EGD) WITH PROPOFOL ;  Surgeon: Lamar Bunk, MD;  Location: [facility]  ENDOSCOPY;  Service: Endoscopy;  Laterality: N/A;   RIGHT/LEFT HEART CATH AND CORONARY ANGIOGRAPHY N/A 05/22/2016   Procedure: Right/Left Heart Cath and Coronary Angiography;  Surgeon: Peter M Jordan, MD;  Location: [facility] INVASIVE CV LAB;  Service: Cardiovascular;  Laterality: N/A;   SUBQ ICD IMPLANT N/A 09/27/2016   Procedure: SubQ ICD Implant;  Surgeon: Waddell Danelle ORN, MD;  Location: [facility] INVASIVE CV LAB;  Service: Cardiovascular;  Laterality: N/A;    Family Psychiatric History:  Patient denies a family history of psychiatric illness.   Family history of suicide attempt: Patient denies Family history of homicide attempt: Patient denies Family history of substance abuse: Patient denies  Family History:  Family History  Problem Relation Age of Onset   Cancer Paternal Grandfather    Mental illness Neg Hx     Social History:  Social History   Socioeconomic History   Marital status: Single    Spouse name: Not on file   Number of children: 1   Years of education: Not on file   Highest education level: Not on file  Occupational History   Occupation: unemployeed  Tobacco Use   Smoking status: Never   Smokeless tobacco: Never  Vaping Use   Vaping status: Never Used  Substance and Sexual Activity   Alcohol use: No   Drug use: No   Sexual activity: Not Currently    Birth control/protection: None  Other Topics Concern   Not on file  Social History Narrative   ** Merged History Encounter **       ** Merged History Encounter **       Social Drivers of Health   Tobacco Use: Low Risk (04/22/2024)   Patient History    Smoking Tobacco Use: Never    Smokeless Tobacco Use: Never    Passive Exposure: Not on file  Financial Resource Strain: High Risk (05/04/2023)   Overall Financial Resource Strain (CARDIA)    Difficulty of Paying Living Expenses: Hard  Food Insecurity: No Food Insecurity (03/30/2023)   Hunger Vital Sign    Worried About Running Out of Food in the Last Year: Never  true    Ran Out of Food in the Last Year: Never true  Transportation Needs: No Transportation Needs (03/30/2023)   PRAPARE - Administrator, Civil Service (Medical): No    Lack of Transportation (Non-Medical): No  Physical Activity: Not on file  Stress: Not on file  Social Connections: Not on file  Depression (PHQ2-9): Low Risk (04/22/2024)   Depression (PHQ2-9)    PHQ-2 Score: 0  Alcohol Screen: Not on file  Housing: Unknown (03/30/2023)   Housing Stability Vital Sign    Unable to Pay for Housing in the Last Year: No    Number of Times Moved in the Last Year: Not on file    Homeless in the Last Year: No  Utilities: Not At Risk (03/30/2023)   AHC Utilities    Threatened with loss of utilities: No  Health Literacy: Not on file    Allergies:  Allergies  Allergen Reactions   Tramadol  Itching and Other (See Comments)    Red spots all over body and itching   Furosemide  Rash   Leflunomide Rash    Metabolic Disorder Labs: Lab Results  Component Value Date   HGBA1C 5.1 03/29/2023  MPG 100 03/29/2023   MPG 111.15 07/08/2021   No results found for: PROLACTIN Lab Results  Component Value Date   CHOL 157 03/29/2023   TRIG 94 03/29/2023   HDL 81 03/29/2023   CHOLHDL 1.9 03/29/2023   VLDL 19 03/29/2023   LDLCALC 57 03/29/2023   LDLCALC 73 12/14/2015   Lab Results  Component Value Date   TSH 2.530 06/09/2019   TSH 1.670 09/18/2017    Therapeutic Level Labs: No results found for: LITHIUM No results found for: VALPROATE No results found for: CBMZ  Current Medications: Current Outpatient Medications  Medication Sig Dispense Refill   amoxicillin  (AMOXIL ) 500 MG capsule Take 1 capsule (500 mg total) by mouth every 8 (eight) hours until finished. 21 capsule 0   buprenorphine -naloxone  (SUBOXONE ) 8-2 mg SUBL SL tablet Place 1 tablet under the tongue 2 (two) times daily as needed. 60 tablet 0   buprenorphine -naloxone  (SUBOXONE ) 8-2 mg SUBL SL tablet Place  1 tablet under the tongue 2 (two) times daily as needed. 60 tablet 0   buprenorphine -naloxone  (SUBOXONE ) 8-2 mg SUBL SL tablet Place 1 tablet under the tongue 2 (two) times daily as needed. 60 tablet 0   buprenorphine -naloxone  (SUBOXONE ) 8-2 mg SUBL SL tablet Place 1 tablet under the tongue 2 (two) times daily as needed. NO ALCOHOL. 60 tablet 0   buprenorphine -naloxone  (SUBOXONE ) 8-2 mg SUBL SL tablet Place 1 tablet under the tongue 2 (two) times daily as needed. No Alcohol. 60 tablet 0   carvedilol  (COREG ) 3.125 MG tablet Take 1 tablet (3.125 mg total) by mouth 2 (two) times daily with a meal. 180 tablet 1   chlorhexidine  (PERIDEX ) 0.12 % solution Swish for one minute, then spit out, twice daily for 10 days. 473 mL 0   gabapentin  (NEURONTIN ) 300 MG capsule Take 1 capsule (300 mg total) by mouth 3 (three) times daily as needed. 90 capsule 1   gabapentin  (NEURONTIN ) 300 MG capsule Take 1 capsule (300 mg total) by mouth 3 (three) times daily as needed. 90 capsule 1   ibuprofen  (ADVIL ) 800 MG tablet Take 1 tablet (800 mg total) by mouth every 8 (eight) hours as needed for pain. 20 tablet 0   JARDIANCE  10 MG TABS tablet Take 1 tablet (10 mg total) by mouth daily. PLEASE SCHEDULE APPOINTMENT FOR MORE REFILLS [phone] OPTION 2 90 tablet 0   PARoxetine  (PAXIL ) 40 MG tablet Take 1 tablet (40 mg total) by mouth daily. 30 tablet 3   potassium chloride  SA (KLOR-CON  M) 20 MEQ tablet Take 1 tablet (20 mEq total) by mouth daily. 30 tablet 3   sacubitril -valsartan  (ENTRESTO ) 49-51 MG Take 1 tablet by mouth 2 (two) times daily. 28 tablet 0   spironolactone  (ALDACTONE ) 25 MG tablet Take 1 tablet (25 mg total) by mouth daily. 90 tablet 1   torsemide  (DEMADEX ) 20 MG tablet Take 2 tablets (40 mg total) by mouth daily. 60 tablet 3   traZODone  (DESYREL ) 50 MG tablet Take 1 tablet (50 mg total) by mouth at bedtime as needed. 30 tablet 3   No current facility-administered medications for this visit.      Musculoskeletal: Strength & Muscle Tone: within normal limits Gait & Station: normal Patient leans: N/A  Psychiatric Specialty Exam: Review of Systems  Psychiatric/Behavioral:  Negative for decreased concentration, dysphoric mood, hallucinations, self-injury, sleep disturbance and suicidal ideas. The patient is not nervous/anxious and is not hyperactive.     There were no vitals taken for this visit.There is no height or  weight on file to calculate BMI.  General Appearance: Casual  Eye Contact:  Good  Speech:  Clear and Coherent and Normal Rate  Volume:  Normal  Mood:  Euthymic  Affect:  Appropriate  Thought Process:  Coherent, Goal Directed, and Descriptions of Associations: Intact  Orientation:  Full (Time, Place, and Person)  Thought Content: WDL   Suicidal Thoughts:  No  Homicidal Thoughts:  No  Memory:  Immediate;   Good Recent;   Good Remote;   Good  Judgement:  Good  Insight:  Good  Psychomotor Activity:  Normal  Concentration:  Concentration: Good and Attention Span: Good  Recall:  Good  Fund of Knowledge: Good  Language: Good  Akathisia:  No  Handed:  Right  AIMS (if indicated): not done  Assets:  Communication Skills Desire for Improvement Housing Social Support  ADL's:  Intact  Cognition: WNL  Sleep:  Good   Screenings: AIMS    Flowsheet Row [facility]  AIMS Total Score 0   AUDIT    Flowsheet Row [facility]  Alcohol Use Disorder Identification Test Final Score (AUDIT) 0   GAD-7    Flowsheet Row Video Visit from 04/22/2024 in [facility] Video Visit from 02/19/2024 in [facility] Video Visit from 12/18/2023 in [facility] Office Visit from 11/05/2023 in [facility] Counselor from  11/02/2023 in [facility]  Total GAD-7 Score 0 0 0 0 0   PHQ2-9    Flowsheet Row Video Visit from 04/22/2024 in [facility] Video Visit from 02/19/2024 in [facility] Video Visit from 12/18/2023 in [facility] Office Visit from 11/05/2023 in [facility] Counselor from 11/02/2023 in [facility]  PHQ-2 Total Score 0 0 0 0 2  PHQ-9 Total Score -- -- -- -- 3   Flowsheet Row Video Visit from 04/22/2024 in [facility] Video Visit from 02/19/2024 in [facility] Video Visit from 12/18/2023 in [facility]  C-SSRS RISK CATEGORY No Risk No Risk No Risk     Assessment and Plan:   Kathy Rosales is a 49 year old female with a past psychiatric history significant for major depressive disorder (recurrent, in full remission) presents to [facility] for follow-up and medication management.  Patient presents to the encounter stating that she has been taking her medications regularly and denies experiencing any adverse side effects at this time.  Patient denies overt depressive symptoms nor does she endorse anxiety.  A PHQ-2 screen was performed with the patient scoring a 0.  A GAD-7 screen was also performed with patient scoring a 0.  Patient endorses stability on her current medication regimen and would like to continue taking her medications as prescribed.  Patient's medications to be e-prescribed to pharmacy of choice.  A [place] Suicide Severity Rating Scale was performed with the patient being considered no risk.  Patient denies suicidal ideations and is able to contract for safety at this time.   Collaboration of Care: Collaboration of Care: Medication Management AEB  provider managing patient's psychiatric  medication, Psychiatrist AEB patient being follow by mental health provider at this facility, and Other provider involved in patient's care AEB provider being seen  by cardiology  Patient/Guardian was advised Release of Information must be obtained prior to any record release in order to collaborate their care with an outside provider. Patient/Guardian was advised if they have not already done so to contact the registration department to sign all necessary forms in order for us  to release information regarding their care.   Consent: Patient/Guardian gives verbal consent for treatment and assignment of benefits for services provided during this visit. Patient/Guardian expressed understanding and agreed to proceed.   1. Recurrent major depressive disorder, in full remission  - PARoxetine  (PAXIL ) 40 MG tablet; Take 1 tablet (40 mg total) by mouth daily.  Dispense: 30 tablet; Refill: 3 - traZODone  (DESYREL ) 50 MG tablet; Take 1 tablet (50 mg total) by mouth at bedtime as needed.  Dispense: 30 tablet; Refill: 3  Patient to follow up in 3 months Provider spent a total of 10 minutes with the patient/reviewing the patient's chart  Reginia FORBES Bolster, PA 04/22/2024, 11:27 AM

## 2024-04-23 ENCOUNTER — Other Ambulatory Visit (HOSPITAL_COMMUNITY): Payer: Self-pay

## 2024-07-22 ENCOUNTER — Telehealth (HOSPITAL_COMMUNITY): Admitting: Physician Assistant
